# Patient Record
Sex: Male | Born: 1935 | Race: White | Hispanic: No | Marital: Married | Smoking: Former smoker
Health system: Southern US, Community
[De-identification: ages and names within clinical notes are randomized; demographics above are authoritative.]

## PROBLEM LIST (undated history)

## (undated) DIAGNOSIS — M069 Rheumatoid arthritis, unspecified: Secondary | ICD-10-CM

## (undated) DIAGNOSIS — I219 Acute myocardial infarction, unspecified: Secondary | ICD-10-CM

## (undated) DIAGNOSIS — N400 Enlarged prostate without lower urinary tract symptoms: Secondary | ICD-10-CM

## (undated) DIAGNOSIS — N184 Chronic kidney disease, stage 4 (severe): Secondary | ICD-10-CM

## (undated) DIAGNOSIS — I255 Ischemic cardiomyopathy: Secondary | ICD-10-CM

## (undated) DIAGNOSIS — I340 Nonrheumatic mitral (valve) insufficiency: Secondary | ICD-10-CM

## (undated) DIAGNOSIS — E119 Type 2 diabetes mellitus without complications: Secondary | ICD-10-CM

## (undated) DIAGNOSIS — E785 Hyperlipidemia, unspecified: Secondary | ICD-10-CM

## (undated) DIAGNOSIS — I10 Essential (primary) hypertension: Secondary | ICD-10-CM

## (undated) DIAGNOSIS — G629 Polyneuropathy, unspecified: Secondary | ICD-10-CM

## (undated) DIAGNOSIS — I509 Heart failure, unspecified: Secondary | ICD-10-CM

## (undated) DIAGNOSIS — C189 Malignant neoplasm of colon, unspecified: Secondary | ICD-10-CM

## (undated) HISTORY — DX: Malignant neoplasm of colon, unspecified: C18.9

## (undated) HISTORY — PX: CATARACT EXTRACTION: SUR2

## (undated) HISTORY — DX: Ischemic cardiomyopathy: I25.5

## (undated) HISTORY — DX: Acute myocardial infarction, unspecified: I21.9

## (undated) HISTORY — DX: Chronic kidney disease, stage 4 (severe): N18.4

---

## 1988-07-26 DIAGNOSIS — I219 Acute myocardial infarction, unspecified: Secondary | ICD-10-CM

## 1988-07-26 HISTORY — DX: Acute myocardial infarction, unspecified: I21.9

## 1997-12-19 ENCOUNTER — Encounter: Admission: RE | Admit: 1997-12-19 | Discharge: 1997-12-19 | Payer: Self-pay | Admitting: Sports Medicine

## 1998-03-20 ENCOUNTER — Encounter: Admission: RE | Admit: 1998-03-20 | Discharge: 1998-03-20 | Payer: Self-pay | Admitting: Sports Medicine

## 1998-05-23 ENCOUNTER — Encounter: Admission: RE | Admit: 1998-05-23 | Discharge: 1998-05-23 | Payer: Self-pay | Admitting: Family Medicine

## 1998-07-03 ENCOUNTER — Ambulatory Visit (HOSPITAL_COMMUNITY): Admission: RE | Admit: 1998-07-03 | Discharge: 1998-07-03 | Payer: Self-pay | Admitting: Cardiology

## 1998-07-03 ENCOUNTER — Encounter: Payer: Self-pay | Admitting: Cardiology

## 1998-07-11 ENCOUNTER — Encounter: Admission: RE | Admit: 1998-07-11 | Discharge: 1998-07-11 | Payer: Self-pay | Admitting: Sports Medicine

## 1998-08-01 ENCOUNTER — Encounter: Admission: RE | Admit: 1998-08-01 | Discharge: 1998-08-01 | Payer: Self-pay | Admitting: Sports Medicine

## 1998-10-16 ENCOUNTER — Encounter: Admission: RE | Admit: 1998-10-16 | Discharge: 1998-10-16 | Payer: Self-pay | Admitting: Sports Medicine

## 1999-02-12 ENCOUNTER — Encounter: Admission: RE | Admit: 1999-02-12 | Discharge: 1999-02-12 | Payer: Self-pay | Admitting: Sports Medicine

## 1999-05-21 ENCOUNTER — Encounter: Admission: RE | Admit: 1999-05-21 | Discharge: 1999-05-21 | Payer: Self-pay | Admitting: Sports Medicine

## 1999-06-24 ENCOUNTER — Encounter: Admission: RE | Admit: 1999-06-24 | Discharge: 1999-06-24 | Payer: Self-pay | Admitting: Family Medicine

## 1999-07-07 ENCOUNTER — Ambulatory Visit (HOSPITAL_COMMUNITY): Admission: RE | Admit: 1999-07-07 | Discharge: 1999-07-07 | Payer: Self-pay | Admitting: Cardiology

## 1999-07-07 ENCOUNTER — Encounter: Payer: Self-pay | Admitting: Cardiology

## 1999-07-30 ENCOUNTER — Other Ambulatory Visit: Admission: RE | Admit: 1999-07-30 | Discharge: 1999-07-30 | Payer: Self-pay | Admitting: Urology

## 1999-09-17 ENCOUNTER — Encounter: Admission: RE | Admit: 1999-09-17 | Discharge: 1999-09-17 | Payer: Self-pay | Admitting: Sports Medicine

## 1999-12-07 ENCOUNTER — Encounter: Admission: RE | Admit: 1999-12-07 | Discharge: 1999-12-07 | Payer: Self-pay | Admitting: Family Medicine

## 2000-02-04 ENCOUNTER — Encounter: Admission: RE | Admit: 2000-02-04 | Discharge: 2000-02-04 | Payer: Self-pay | Admitting: Family Medicine

## 2000-04-04 ENCOUNTER — Encounter: Admission: RE | Admit: 2000-04-04 | Discharge: 2000-04-04 | Payer: Self-pay | Admitting: Family Medicine

## 2000-05-19 ENCOUNTER — Encounter: Admission: RE | Admit: 2000-05-19 | Discharge: 2000-05-19 | Payer: Self-pay | Admitting: Family Medicine

## 2000-05-24 ENCOUNTER — Encounter: Admission: RE | Admit: 2000-05-24 | Discharge: 2000-05-24 | Payer: Self-pay | Admitting: Family Medicine

## 2000-08-04 ENCOUNTER — Encounter: Admission: RE | Admit: 2000-08-04 | Discharge: 2000-08-04 | Payer: Self-pay | Admitting: Family Medicine

## 2000-11-10 ENCOUNTER — Encounter: Admission: RE | Admit: 2000-11-10 | Discharge: 2000-11-10 | Payer: Self-pay | Admitting: Sports Medicine

## 2001-02-16 ENCOUNTER — Encounter: Admission: RE | Admit: 2001-02-16 | Discharge: 2001-02-16 | Payer: Self-pay | Admitting: Family Medicine

## 2001-05-18 ENCOUNTER — Encounter: Admission: RE | Admit: 2001-05-18 | Discharge: 2001-05-18 | Payer: Self-pay | Admitting: Sports Medicine

## 2001-08-10 ENCOUNTER — Encounter: Admission: RE | Admit: 2001-08-10 | Discharge: 2001-08-10 | Payer: Self-pay | Admitting: Sports Medicine

## 2001-12-07 ENCOUNTER — Encounter: Admission: RE | Admit: 2001-12-07 | Discharge: 2001-12-07 | Payer: Self-pay | Admitting: Sports Medicine

## 2002-03-15 ENCOUNTER — Encounter: Admission: RE | Admit: 2002-03-15 | Discharge: 2002-03-15 | Payer: Self-pay | Admitting: Sports Medicine

## 2002-05-10 ENCOUNTER — Encounter: Admission: RE | Admit: 2002-05-10 | Discharge: 2002-05-10 | Payer: Self-pay | Admitting: Sports Medicine

## 2002-06-14 ENCOUNTER — Encounter: Admission: RE | Admit: 2002-06-14 | Discharge: 2002-06-14 | Payer: Self-pay | Admitting: Sports Medicine

## 2002-07-12 ENCOUNTER — Encounter: Payer: Self-pay | Admitting: Cardiology

## 2002-07-12 ENCOUNTER — Ambulatory Visit (HOSPITAL_COMMUNITY): Admission: RE | Admit: 2002-07-12 | Discharge: 2002-07-12 | Payer: Self-pay | Admitting: Cardiology

## 2002-09-13 ENCOUNTER — Encounter: Admission: RE | Admit: 2002-09-13 | Discharge: 2002-09-13 | Payer: Self-pay | Admitting: Sports Medicine

## 2002-10-05 ENCOUNTER — Encounter: Admission: RE | Admit: 2002-10-05 | Discharge: 2002-10-05 | Payer: Self-pay | Admitting: Family Medicine

## 2002-10-12 ENCOUNTER — Encounter: Admission: RE | Admit: 2002-10-12 | Discharge: 2002-10-12 | Payer: Self-pay | Admitting: Family Medicine

## 2002-12-20 ENCOUNTER — Encounter: Admission: RE | Admit: 2002-12-20 | Discharge: 2002-12-20 | Payer: Self-pay | Admitting: Family Medicine

## 2003-01-07 ENCOUNTER — Encounter: Admission: RE | Admit: 2003-01-07 | Discharge: 2003-01-07 | Payer: Self-pay | Admitting: Sports Medicine

## 2003-01-07 ENCOUNTER — Encounter: Payer: Self-pay | Admitting: Sports Medicine

## 2003-01-07 ENCOUNTER — Encounter: Admission: RE | Admit: 2003-01-07 | Discharge: 2003-01-07 | Payer: Self-pay | Admitting: Family Medicine

## 2003-01-31 ENCOUNTER — Encounter: Admission: RE | Admit: 2003-01-31 | Discharge: 2003-01-31 | Payer: Self-pay | Admitting: Sports Medicine

## 2003-03-14 ENCOUNTER — Encounter: Admission: RE | Admit: 2003-03-14 | Discharge: 2003-03-14 | Payer: Self-pay | Admitting: Sports Medicine

## 2003-03-18 ENCOUNTER — Encounter: Admission: RE | Admit: 2003-03-18 | Discharge: 2003-03-18 | Payer: Self-pay | Admitting: Sports Medicine

## 2003-03-18 ENCOUNTER — Encounter: Payer: Self-pay | Admitting: Sports Medicine

## 2003-05-09 ENCOUNTER — Encounter: Admission: RE | Admit: 2003-05-09 | Discharge: 2003-05-09 | Payer: Self-pay | Admitting: Sports Medicine

## 2003-09-30 ENCOUNTER — Encounter: Admission: RE | Admit: 2003-09-30 | Discharge: 2003-09-30 | Payer: Self-pay | Admitting: Family Medicine

## 2003-09-30 ENCOUNTER — Ambulatory Visit (HOSPITAL_COMMUNITY): Admission: RE | Admit: 2003-09-30 | Discharge: 2003-09-30 | Payer: Self-pay | Admitting: Family Medicine

## 2003-10-03 ENCOUNTER — Encounter: Admission: RE | Admit: 2003-10-03 | Discharge: 2003-10-03 | Payer: Self-pay | Admitting: Sports Medicine

## 2003-10-10 ENCOUNTER — Encounter: Admission: RE | Admit: 2003-10-10 | Discharge: 2003-10-10 | Payer: Self-pay | Admitting: Sports Medicine

## 2003-11-14 ENCOUNTER — Encounter: Admission: RE | Admit: 2003-11-14 | Discharge: 2003-11-14 | Payer: Self-pay | Admitting: Family Medicine

## 2003-11-19 ENCOUNTER — Encounter: Admission: RE | Admit: 2003-11-19 | Discharge: 2004-02-17 | Payer: Self-pay | Admitting: Sports Medicine

## 2004-01-28 ENCOUNTER — Encounter: Admission: RE | Admit: 2004-01-28 | Discharge: 2004-01-28 | Payer: Self-pay | Admitting: Sports Medicine

## 2004-04-02 ENCOUNTER — Ambulatory Visit: Payer: Self-pay | Admitting: Sports Medicine

## 2004-04-30 ENCOUNTER — Ambulatory Visit: Payer: Self-pay | Admitting: Sports Medicine

## 2004-06-02 ENCOUNTER — Ambulatory Visit: Payer: Self-pay | Admitting: Family Medicine

## 2004-06-25 ENCOUNTER — Ambulatory Visit: Payer: Self-pay | Admitting: Sports Medicine

## 2004-09-03 ENCOUNTER — Ambulatory Visit: Payer: Self-pay | Admitting: Sports Medicine

## 2004-10-08 ENCOUNTER — Ambulatory Visit (HOSPITAL_COMMUNITY): Admission: RE | Admit: 2004-10-08 | Discharge: 2004-10-08 | Payer: Self-pay | Admitting: Sports Medicine

## 2004-10-08 ENCOUNTER — Ambulatory Visit: Payer: Self-pay | Admitting: Sports Medicine

## 2004-12-25 ENCOUNTER — Ambulatory Visit: Payer: Self-pay | Admitting: Sports Medicine

## 2005-03-08 ENCOUNTER — Ambulatory Visit (HOSPITAL_COMMUNITY): Admission: RE | Admit: 2005-03-08 | Discharge: 2005-03-08 | Payer: Self-pay | Admitting: Specialist

## 2005-03-30 ENCOUNTER — Ambulatory Visit: Payer: Self-pay | Admitting: Sports Medicine

## 2005-04-08 ENCOUNTER — Ambulatory Visit: Payer: Self-pay | Admitting: Sports Medicine

## 2005-05-28 ENCOUNTER — Ambulatory Visit: Payer: Self-pay | Admitting: Family Medicine

## 2005-07-15 ENCOUNTER — Ambulatory Visit: Payer: Self-pay | Admitting: Sports Medicine

## 2005-10-27 ENCOUNTER — Ambulatory Visit: Payer: Self-pay | Admitting: Sports Medicine

## 2006-01-13 ENCOUNTER — Ambulatory Visit: Payer: Self-pay | Admitting: Sports Medicine

## 2006-04-07 ENCOUNTER — Ambulatory Visit: Payer: Self-pay | Admitting: Sports Medicine

## 2006-05-27 ENCOUNTER — Ambulatory Visit: Payer: Self-pay | Admitting: Family Medicine

## 2006-05-30 ENCOUNTER — Ambulatory Visit: Payer: Self-pay | Admitting: Sports Medicine

## 2006-06-02 ENCOUNTER — Ambulatory Visit: Payer: Self-pay | Admitting: Sports Medicine

## 2006-06-13 ENCOUNTER — Ambulatory Visit: Payer: Self-pay | Admitting: Family Medicine

## 2006-07-07 ENCOUNTER — Ambulatory Visit: Payer: Self-pay | Admitting: Sports Medicine

## 2006-09-22 DIAGNOSIS — M109 Gout, unspecified: Secondary | ICD-10-CM | POA: Insufficient documentation

## 2006-09-22 DIAGNOSIS — I255 Ischemic cardiomyopathy: Secondary | ICD-10-CM

## 2006-09-22 DIAGNOSIS — E785 Hyperlipidemia, unspecified: Secondary | ICD-10-CM | POA: Insufficient documentation

## 2006-09-22 DIAGNOSIS — E669 Obesity, unspecified: Secondary | ICD-10-CM

## 2006-09-22 DIAGNOSIS — M479 Spondylosis, unspecified: Secondary | ICD-10-CM | POA: Insufficient documentation

## 2006-09-22 DIAGNOSIS — N400 Enlarged prostate without lower urinary tract symptoms: Secondary | ICD-10-CM

## 2006-10-06 ENCOUNTER — Telehealth: Payer: Self-pay | Admitting: *Deleted

## 2006-10-10 ENCOUNTER — Telehealth: Payer: Self-pay | Admitting: Family Medicine

## 2006-10-20 ENCOUNTER — Ambulatory Visit: Payer: Self-pay | Admitting: Sports Medicine

## 2006-11-22 ENCOUNTER — Telehealth: Payer: Self-pay | Admitting: *Deleted

## 2007-01-26 ENCOUNTER — Encounter: Payer: Self-pay | Admitting: Sports Medicine

## 2007-02-02 ENCOUNTER — Ambulatory Visit: Payer: Self-pay | Admitting: Sports Medicine

## 2007-03-08 ENCOUNTER — Telehealth: Payer: Self-pay | Admitting: Sports Medicine

## 2007-05-04 ENCOUNTER — Ambulatory Visit: Payer: Self-pay | Admitting: Sports Medicine

## 2007-05-04 DIAGNOSIS — M719 Bursopathy, unspecified: Secondary | ICD-10-CM

## 2007-05-04 DIAGNOSIS — M67919 Unspecified disorder of synovium and tendon, unspecified shoulder: Secondary | ICD-10-CM | POA: Insufficient documentation

## 2007-07-10 ENCOUNTER — Telehealth (INDEPENDENT_AMBULATORY_CARE_PROVIDER_SITE_OTHER): Payer: Self-pay | Admitting: Family Medicine

## 2007-08-24 ENCOUNTER — Ambulatory Visit: Payer: Self-pay | Admitting: Sports Medicine

## 2007-08-24 LAB — CONVERTED CEMR LAB

## 2007-08-25 LAB — CONVERTED CEMR LAB
BUN: 20 mg/dL (ref 6–23)
CO2: 26 meq/L (ref 19–32)
Calcium: 9.6 mg/dL (ref 8.4–10.5)
Chloride: 104 meq/L (ref 96–112)
Cholesterol: 132 mg/dL (ref 0–200)
Creatinine, Ser: 0.94 mg/dL (ref 0.40–1.50)
Glucose, Bld: 105 mg/dL — ABNORMAL HIGH (ref 70–99)
HCT: 41.9 % (ref 39.0–52.0)
HDL: 51 mg/dL (ref >39)
Hemoglobin: 14.1 g/dL (ref 13.0–17.0)
Total CHOL/HDL Ratio: 2.6
Triglycerides: 46 mg/dL (ref <150)
WBC: 6.8 10*3/uL (ref 4.0–10.5)

## 2007-11-16 ENCOUNTER — Ambulatory Visit: Payer: Self-pay | Admitting: Sports Medicine

## 2007-11-16 DIAGNOSIS — M7020 Olecranon bursitis, unspecified elbow: Secondary | ICD-10-CM

## 2008-01-01 ENCOUNTER — Telehealth: Payer: Self-pay | Admitting: Sports Medicine

## 2008-01-03 ENCOUNTER — Telehealth: Payer: Self-pay | Admitting: Sports Medicine

## 2008-02-08 ENCOUNTER — Ambulatory Visit: Payer: Self-pay | Admitting: Sports Medicine

## 2008-02-20 ENCOUNTER — Telehealth: Payer: Self-pay | Admitting: *Deleted

## 2008-04-19 ENCOUNTER — Ambulatory Visit: Payer: Self-pay | Admitting: Family Medicine

## 2008-05-24 ENCOUNTER — Ambulatory Visit: Payer: Self-pay | Admitting: Family Medicine

## 2008-05-24 DIAGNOSIS — R1011 Right upper quadrant pain: Secondary | ICD-10-CM

## 2008-07-30 ENCOUNTER — Encounter: Payer: Self-pay | Admitting: Family Medicine

## 2008-09-09 ENCOUNTER — Ambulatory Visit: Payer: Self-pay | Admitting: Sports Medicine

## 2008-09-09 DIAGNOSIS — R05 Cough: Secondary | ICD-10-CM

## 2008-12-26 ENCOUNTER — Encounter: Payer: Self-pay | Admitting: Family Medicine

## 2009-03-25 ENCOUNTER — Ambulatory Visit: Payer: Self-pay | Admitting: Family Medicine

## 2009-03-25 ENCOUNTER — Encounter: Admission: RE | Admit: 2009-03-25 | Discharge: 2009-03-25 | Payer: Self-pay | Admitting: Family Medicine

## 2009-03-25 DIAGNOSIS — E119 Type 2 diabetes mellitus without complications: Secondary | ICD-10-CM

## 2009-03-25 DIAGNOSIS — M545 Low back pain: Secondary | ICD-10-CM

## 2009-03-26 ENCOUNTER — Encounter: Payer: Self-pay | Admitting: Family Medicine

## 2009-04-01 ENCOUNTER — Encounter: Payer: Self-pay | Admitting: Family Medicine

## 2009-04-01 ENCOUNTER — Ambulatory Visit: Payer: Self-pay | Admitting: Family Medicine

## 2009-04-17 ENCOUNTER — Telehealth: Payer: Self-pay | Admitting: Family Medicine

## 2009-07-15 ENCOUNTER — Ambulatory Visit: Payer: Self-pay | Admitting: Sports Medicine

## 2009-07-15 ENCOUNTER — Encounter: Payer: Self-pay | Admitting: Family Medicine

## 2009-07-22 ENCOUNTER — Ambulatory Visit: Payer: Self-pay | Admitting: Internal Medicine

## 2009-07-22 DIAGNOSIS — R972 Elevated prostate specific antigen [PSA]: Secondary | ICD-10-CM | POA: Insufficient documentation

## 2009-07-22 DIAGNOSIS — I10 Essential (primary) hypertension: Secondary | ICD-10-CM | POA: Insufficient documentation

## 2009-07-24 ENCOUNTER — Telehealth: Payer: Self-pay | Admitting: Internal Medicine

## 2009-09-01 ENCOUNTER — Telehealth: Payer: Self-pay | Admitting: Family Medicine

## 2009-10-09 ENCOUNTER — Encounter: Payer: Self-pay | Admitting: Family Medicine

## 2009-10-14 LAB — CONVERTED CEMR LAB
Albumin: 4.1 g/dL (ref 3.5–5.2)
Alkaline Phosphatase: 40 units/L (ref 39–117)
BUN: 20 mg/dL (ref 6–23)
Calcium: 9.1 mg/dL (ref 8.4–10.5)
Glucose, Bld: 113 mg/dL — ABNORMAL HIGH (ref 70–99)
HDL: 47 mg/dL (ref >39)
Hemoglobin: 13.6 g/dL (ref 13.0–17.0)
LDL Cholesterol: 75 mg/dL (ref 0–99)
MCHC: 32.6 g/dL (ref 30.0–36.0)
MCV: 89.7 fL (ref 78.0–100.0)
Potassium: 4.4 meq/L (ref 3.5–5.3)
RBC: 4.65 M/uL (ref 4.22–5.81)
Triglycerides: 59 mg/dL (ref <150)

## 2009-10-17 ENCOUNTER — Ambulatory Visit: Payer: Self-pay | Admitting: Family Medicine

## 2009-12-25 ENCOUNTER — Ambulatory Visit: Payer: Self-pay | Admitting: Family Medicine

## 2009-12-25 ENCOUNTER — Encounter: Payer: Self-pay | Admitting: Family Medicine

## 2009-12-26 ENCOUNTER — Encounter: Payer: Self-pay | Admitting: Family Medicine

## 2009-12-26 LAB — CONVERTED CEMR LAB
Alkaline Phosphatase: 39 units/L (ref 39–117)
BUN: 23 mg/dL (ref 6–23)
Creatinine, Ser: 0.93 mg/dL (ref 0.40–1.50)
Glucose, Bld: 108 mg/dL — ABNORMAL HIGH (ref 70–99)
Hemoglobin: 13.1 g/dL (ref 13.0–17.0)
MCHC: 33.6 g/dL (ref 30.0–36.0)
MCV: 86.5 fL (ref 78.0–100.0)
RBC: 4.51 M/uL (ref 4.22–5.81)
Total Bilirubin: 0.6 mg/dL (ref 0.3–1.2)

## 2010-02-13 ENCOUNTER — Ambulatory Visit: Payer: Self-pay | Admitting: Family Medicine

## 2010-03-12 ENCOUNTER — Telehealth: Payer: Self-pay | Admitting: *Deleted

## 2010-03-13 ENCOUNTER — Telehealth (INDEPENDENT_AMBULATORY_CARE_PROVIDER_SITE_OTHER): Payer: Self-pay | Admitting: *Deleted

## 2010-04-30 ENCOUNTER — Ambulatory Visit: Payer: Self-pay | Admitting: Sports Medicine

## 2010-08-25 NOTE — Progress Notes (Signed)
Summary: Rx  Phone Note Refill Request Call back at Home Phone [phone]   pt is asking for an Rx Voltaren Gel, sts he gets this in an oral form but wants this in the Gel. pt goes to [facility] pharmacy  Initial call taken by: Knox Royalty,  March 12, 2010 2:54 PM  Follow-up for Phone Call        called and notified pt of rx at pharmacy ready for pick up Follow-up by: Tessie Fass CMA,  March 13, 2010 11:38 AM    New/Updated Medications: VOLTAREN 1 % GEL (DICLOFENAC SODIUM) Sig: Apply to affected area up to four times daily DISP 1 large tube Prescriptions: VOLTAREN 1 % GEL (DICLOFENAC SODIUM) Sig: Apply to affected area up to four times daily DISP 1 large tube  #1 x 3   Entered and Authorized by:   Paula Compton MD   Signed by:   Paula Compton MD on 03/13/2010   Method used:   Electronically to        [facility] [address] (retail)       [address] [place], [place]  [address]       Ph: [phone]       Fax: [phone]   RxID:   [id number]  Please let patient know the Rx was sent in.  Paula Compton MD  March 13, 2010 11:13 AM

## 2010-08-25 NOTE — Letter (Signed)
Summary: Generic Letter  [facility]  [address]   [place], [place] [address]   Phone: [phone]  Fax: [phone]    12/26/2009  Todd [name] [address] [place], [place]  [address]  Dear [name],   I hope this letter finds you well.  I write with excellent news about your lab work.  Your Complete Blood Count, metabolic panel, and LDL "bad" cholesterol are all within normal limits.   I am sending a copy of the lab results along with this letter.        Sincerely,   Paula Compton MD  Appended Document: Generic Letter mailed

## 2010-08-25 NOTE — Consult Note (Signed)
Summary: [facility]  [facility]   Imported By: Bradly Bienenstock 10/13/2009 17:30:08  _____________________________________________________________________  External Attachment:    Type:   Image     Comment:   External Document

## 2010-08-25 NOTE — Assessment & Plan Note (Signed)
Summary: ELBOW ISSUE,[facility]   Primary Provider:  Dondra Spry DO   History of Present Illness: Todd [name] has had recurrent gout flares over several years now almost 2 yrs since much elbow pain has been doing water aerobics regualarly recently some mild RT elbow pain posteriorly and on left elbow mod pain and swelling over post elbow  no fever no chills occ uses voltaren or gel with good results on feet, hands  Allergies: 1)  Digitek  Physical Exam  General:  Well-developed,well-nourished,in no acute distress; alert,appropriate and cooperative throughout examination Msk:  good ROM of RT elbow no swelling over olecranon in hands there are some nodules that appear OA and a few dorsal changes over 2nd PIP that appear likely to be small tophi  left elbow shows marked swelling of olecranon bursa no warmth no tenderness   Impression & Recommendations:  Problem # 1:  OLECRANON BURSITIS (ICD-726.33)  This is a recurrent problem for Todd [name] use low dose steroid  cleanse with alcohol Topical analgesic spray : Ethyl choride Joint left olecranon bursa Approached in typical fashion with: direct posterior puncture of bursa Completed without difficulty Meds: 3/4 cc kenalog 10 + 3/4 cc lidocaine 1% Needle: 25 G and 1.5 in Aftercare instructions and Red flags advised   avoid much acticity next 2 days start using compression wrap  use particularly for work and arm acticity to see if we can lessen flares  reck 4 wks pending response or as needed  Orders: Garment,belt,sleeve or other covering ,elastic or similar stretch (W1027) Kenalog 10 mg inj (O5366) Trigger Point Injection Single Tendon Origin/Insertion (44034)  Complete Medication List: 1)  Aspirin Ec 81 Mg Tbec (Aspirin) .... Take 1 tablet by mouth once a day 2)  Benazepril Hcl 40 Mg Tabs (Benazepril hcl) .... Take 1 tablet by mouth once a day 3)  Doxazosin Mesylate 8 Mg Tabs (Doxazosin mesylate) .... Take 1 tablet by mouth  once a day 4)  Felodipine 5 Mg Tb24 (Felodipine) .... Take 1 tablet by mouth once a day 5)  Glucosamine-chondroitin [employer] (Glucosamine-chondroit-vit c-mn) .... Take 1 capsule by mouth three times a day 6)  Hydrochlorothiazide 25 Mg Tabs (Hydrochlorothiazide) .... Take 1 tablet by mouth once a day 7)  Isosorbide Mononitrate Cr 60 Mg Tb24 (Isosorbide mononitrate) .... Take 1 tablet by mouth once a day 8)  Simvastatin 40 Mg Tabs (Simvastatin) .... Take 1 tablet by mouth at bedtime 9)  Udamin Sp Tabs (Specialty vitamins products) .... Take 1 tablet by mouth once a day 10)  Nitroquick 0.4 Mg Subl (Nitroglycerin) .Marland Kitchen.. 1 sl as needed and repaeat in 5 mins if needed 11)  Diclofenac Sodium 50 Mg Tbec (Diclofenac sodium) .... Take one tab as needed 12)  Mometasone Furoate 0.1 % Crea (Mometasone furoate) .... Apply daily 13)  Fish Oil Double Strength 1200 Mg Caps (Omega-3 fatty acids) .Marland Kitchen.. 1 by mouth bid 14)  Nitroglycerin 0.2 Mg/hr Pt24 (Nitroglycerin) .... Use 1 patch qd 15)  Multivitamins Tabs (Multiple vitamin) .... Once daily 16)  Claritin 10 Mg Caps (Loratadine) .... Once daily 17)  Toprol Xl 25 Mg Xr24h-tab (Metoprolol succinate) .Marland Kitchen.. 1 by mouth once daily 18)  Gfn 1200/dm 60 1200-60 Mg Xr12h-tab (Dextromethorphan-guaifenesin) .... Sig take 1 tab by mouth every 12 hours as directed for cough/congestion disp 30 19)  Zostavax 74259 Unt/0.80ml Solr (Zoster vaccine live) .... Dispense vaccine to patient,to be adminstered in doctors office. 20)  Voltaren 1 % Gel (Diclofenac sodium) .... Sig: apply  to affected area up to four times daily disp 1 large tube

## 2010-08-25 NOTE — Progress Notes (Signed)
Summary: Rx Prob  Phone Note Call from Patient Call back at Home Phone [phone]   Caller: Patient Summary of Call: Pt states went to pick up rx from [facility] and it was not there can it be re sent. Initial call taken by: Clydell Hakim,  March 13, 2010 2:01 PM  Follow-up for Phone Call        rx is at pharmacy ready to pick up. patient notified. Follow-up by: Theresia Lo RN,  March 13, 2010 3:32 PM

## 2010-08-25 NOTE — Assessment & Plan Note (Signed)
Summary: back pain,tcb   Vital Signs:  Patient profile:   77 year old male Height:      69 inches Weight:      221 pounds BMI:     32.75 Temp:     98.4 degrees F Pulse rate:   58 / minute BP sitting:   134 / 68  (left arm) Cuff size:   regular  Vitals Entered By: Dennison Nancy RN (February 13, 2010 4:06 PM) CC: [place] for low back pain Is Patient Diabetic? Yes Did you bring your meter with you today? No Pain Assessment Patient in pain? yes     Location: lower back Intensity: 2 Type: aching Onset of pain  Since Friday   Primary Care Provider:  D. Thomos Lemons DO  CC:  [place] for low back pain.  History of Present Illness: Todd [name] comes in today for complaint of acute lower back pain that is focused on the L side of his lower back, started after some light moving on Friday, July 15 (1 week ago).  Not similar to his usual DJD back pain, which is more in the midline.  No radiation of this pain down the legs, no weakness, no falls.   He tried to exercise the next day, also on Monday July 18th, but aggravated.  Got a little better with NSAIDs, then stopped and it came back.   Reviewed med list.  No recent gouty attacks.  He shows me some nodules along DIP joints in both hands, which are new and occasionally flare.   Goes to [place] at the [employer] [facility] with his wife, has done so for 25 yrs.   Habits & Providers  Alcohol-Tobacco-Diet     Tobacco Status: quit     Year Quit: 1969  Allergies: 1)  Digitek  Social History: married Suse - she is retiring May 26, 2007 2 children; retired Horticulturist, commercial now consults;  wine social etoh only;  no tobacco    February 13, 2010: Planning cruise to [place] to [place] in November. Aquacise program at [address]  Physical Exam  General:  Generally well, no apparent distress Mouth:  moist mucus membranes Lungs:  Normal respiratory effort, chest expands symmetrically. Lungs are clear to  auscultation, no crackles or wheezes. Heart:  Normal rate and regular rhythm. S1 and S2 normal without gallop, murmur, click, rub or other extra sounds. Msk:  back: mild paraspinous tenderness along sacral on the L side.  Full ROM with forward bending, sidebending.  Gait unremarkable, no assistance.  Extremities:  1+ edema pitting in both feet. Palpable dp pulses bilaterally   Impression & Recommendations:  Problem # 1:  BACK PAIN, LUMBAR (ICD-724.2)  Mild and improving.  Recommended that he take the diclofenac ATC for the next few days, with plenty of water to maintain hydration in the hot weather.  Keep up with stretching and Aquacise at low intensity, gradually increase.  Notify me if starts with radiation, worsening pain or weakness.  Past workup for LBP has included xrays showing DJD, CBC without anemia, no elevated alk phos.  His updated medication list for this problem includes:    Aspirin Ec 81 Mg Tbec (Aspirin) .Marland Kitchen... Take 1 tablet by mouth once a day    Diclofenac Sodium 50 Mg Tbec (Diclofenac sodium) .Marland Kitchen... Take one tab as needed  Orders: [facility]- Est Level  3 ([address])  Problem # 2:  HYPERTENSION (ICD-401.9)  controlled. continue current plan His updated  medication list for this problem includes:    Benazepril Hcl 40 Mg Tabs (Benazepril hcl) .Marland Kitchen... Take 1 tablet by mouth once a day    Doxazosin Mesylate 8 Mg Tabs (Doxazosin mesylate) .Marland Kitchen... Take 1 tablet by mouth once a day    Felodipine 5 Mg Tb24 (Felodipine) .Marland Kitchen... Take 1 tablet by mouth once a day    Hydrochlorothiazide 25 Mg Tabs (Hydrochlorothiazide) .Marland Kitchen... Take 1 tablet by mouth once a day    Toprol Xl 25 Mg Xr24h-tab (Metoprolol succinate) .Marland Kitchen... 1 by mouth once daily  Orders: [facility]- Est Level  3 ([address])  Complete Medication List: 1)  Aspirin Ec 81 Mg Tbec (Aspirin) .... Take 1 tablet by mouth once a day 2)  Benazepril Hcl 40 Mg Tabs (Benazepril hcl) .... Take 1 tablet by mouth once a day 3)  Doxazosin Mesylate 8 Mg Tabs  (Doxazosin mesylate) .... Take 1 tablet by mouth once a day 4)  Felodipine 5 Mg Tb24 (Felodipine) .... Take 1 tablet by mouth once a day 5)  Glucosamine-chondroitin [employer] (Glucosamine-chondroit-vit c-mn) .... Take 1 capsule by mouth three times a day 6)  Hydrochlorothiazide 25 Mg Tabs (Hydrochlorothiazide) .... Take 1 tablet by mouth once a day 7)  Isosorbide Mononitrate Cr 60 Mg Tb24 (Isosorbide mononitrate) .... Take 1 tablet by mouth once a day 8)  Simvastatin 40 Mg Tabs (Simvastatin) .... Take 1 tablet by mouth at bedtime 9)  Udamin Sp Tabs (Specialty vitamins products) .... Take 1 tablet by mouth once a day 10)  Nitroquick 0.4 Mg Subl (Nitroglycerin) .Marland Kitchen.. 1 sl as needed and repaeat in 5 mins if needed 11)  Diclofenac Sodium 50 Mg Tbec (Diclofenac sodium) .... Take one tab as needed 12)  Mometasone Furoate 0.1 % Crea (Mometasone furoate) .... Apply daily 13)  Fish Oil Double Strength 1200 Mg Caps (Omega-3 fatty acids) .Marland Kitchen.. 1 by mouth bid 14)  Nitroglycerin 0.2 Mg/hr Pt24 (Nitroglycerin) .... Use 1 patch qd 15)  Multivitamins Tabs (Multiple vitamin) .... Once daily 16)  Claritin 10 Mg Caps (Loratadine) .... Once daily 17)  Toprol Xl 25 Mg Xr24h-tab (Metoprolol succinate) .Marland Kitchen.. 1 by mouth once daily 18)  Gfn 1200/dm 60 1200-60 Mg Xr12h-tab (Dextromethorphan-guaifenesin) .... Sig take 1 tab by mouth every 12 hours as directed for cough/congestion disp 30 19)  Zostavax 60454 Unt/0.52ml Solr (Zoster vaccine live) .... Dispense vaccine to patient,to be adminstered in doctors office.  Patient Instructions: 1)  It was a pleasure to see you today.   2)  For your low back pain, I recommend continuing with the Diclofenac 50mg  twice daily for the next 5 days.  Continue with the increased hydration as you are doing in this hot weather.  Continuing with the range-of-motion exercises/stretches and Aquatic activity at a lower intensity as your back strengthens.  3)  Please let me know if there is not  slow improvement.  As you know from experience, low back pain improvement is often slow and steady (measured in weeks, not days).  4)  Enjoy the cruise in November!   Prevention & Chronic Care Immunizations   Influenza vaccine: Fluvax MCR  (04/19/2008)   Influenza vaccine due: 04/19/2009    Tetanus booster: 05/24/2008: Tdap [facility])   Tetanus booster due: 04/25/2005    Pneumococcal vaccine: Done.  (05/26/2000)   Pneumococcal vaccine due: None    H. zoster vaccine: Not documented  Colorectal Screening   Hemoccult: Not documented    Colonoscopy: Not documented  Other Screening   PSA:  Not documented   Smoking status: quit  (02/13/2010)  Diabetes Mellitus   HgbA1C: 5.7  (12/25/2009)    Eye exam: Not documented    Foot exam: yes  (10/17/2009)   High risk foot: Not documented   Foot care education: Not documented    Urine microalbumin/creatinine ratio: Not documented    Diabetes flowsheet reviewed?: Yes   Progress toward A1C goal: At goal  Lipids   Total Cholesterol: 134  (04/01/2009)   LDL: 75  (04/01/2009)   LDL Direct: 66  (12/25/2009)   HDL: 47  (04/01/2009)   Triglycerides: 59  (04/01/2009)    SGOT (AST): 16  (12/25/2009)   SGPT (ALT): 12  (12/25/2009)   Alkaline phosphatase: 39  (12/25/2009)   Total bilirubin: 0.6  (12/25/2009)    Lipid flowsheet reviewed?: Yes   Progress toward LDL goal: At goal  Hypertension   Last Blood Pressure: 134 / 68  (02/13/2010)   Serum creatinine: 0.93  (12/25/2009)   Serum potassium 4.1  (12/25/2009)    Hypertension flowsheet reviewed?: Yes   Progress toward BP goal: At goal  Self-Management Support :   Personal Goals (by the next clinic visit) :     Personal A1C goal: 7  (10/17/2009)     Personal blood pressure goal: 140/90  (02/13/2010)     Personal LDL goal: 100  (10/17/2009)    Diabetes self-management support: Not documented    Hypertension self-management support: Not documented    Lipid self-management  support: Not documented

## 2010-08-25 NOTE — Progress Notes (Signed)
Summary: phn msg  Phone Note Call from Patient Call back at Home Phone [phone]   Caller: Patient Summary of Call: wanted to talk to Dr Mauricio Po about his Toperol - he is now on 25mg  and everything is OK. Initial call taken by: De Nurse,  September 01, 2009 11:41 AM    New/Updated Medications: TOPROL XL 25 MG XR24H-TAB (METOPROLOL SUCCINATE) 1 by mouth once daily Prescriptions: TOPROL XL 25 MG XR24H-TAB (METOPROLOL SUCCINATE) 1 by mouth once daily  #90 x 3   Entered and Authorized by:   Paula Compton MD   Signed by:   Paula Compton MD on 09/01/2009   Method used:   Electronically to        [facility] [address] (retail)       [address] [place], [place]  [address]       Ph: [phone]       Fax: [phone]   RxID:   [id number]   I called patient back.  He is not home.  Wife says he wanted a refill of the Toprol sent to [facility].  Will send in the Prescription. Paula Compton MD  September 01, 2009 2:00 PM '

## 2010-08-25 NOTE — Assessment & Plan Note (Signed)
Summary: f/u,df   Vital Signs:  Patient profile:   77 year old male Height:      69 inches Weight:      218 pounds BMI:     32.31 Temp:     97.7 degrees F oral Pulse rate:   67 / minute BP sitting:   118 / 63  (right arm) Cuff size:   regular  Vitals Entered By: Tessie Fass CMA (October 17, 2009 1:53 PM) CC: F/U Is Patient Diabetic? No Pain Assessment Patient in pain? no        Primary Care Provider:  Dondra Spry DO  CC:  F/U.  History of Present Illness: Todd [name] is doing well.  No active concerns except for a transient R sided abd pain that is superficial, lasts for a few minutes "just under the skin", does not radiate, and then resolves.  No skin changes.  Not associated with position, with torsing at waist, with deep breathing, or with bowel or bladderfunctions.  Not having it now.   Occasional bilat foot swelling.  We discussed his HF and the discussion with Dr Clarene Duke regarding AICD.  Todd [name] reiterates for me today his lack of interest in pursuing this possibility.   Last labs in Sept 2010, plans to obtain in fasting state.   He asks about Zostavax to prevent shingles. Has never had shingles before,  I recommend the vaccine. He is to call his insurer to find out if it is covered.  Can be prescribed to his pharmacy, and administered here.    Habits & Providers  Alcohol-Tobacco-Diet     Tobacco Status: quit  Current Medications (verified): 1)  Aspirin Ec 81 Mg Tbec (Aspirin) .... Take 1 Tablet By Mouth Once A Day 2)  Benazepril Hcl 40 Mg Tabs (Benazepril Hcl) .... Take 1 Tablet By Mouth Once A Day 3)  Doxazosin Mesylate 8 Mg Tabs (Doxazosin Mesylate) .... Take 1 Tablet By Mouth Once A Day 4)  Felodipine 5 Mg Tb24 (Felodipine) .... Take 1 Tablet By Mouth Once A Day 5)  Glucosamine-Chondroitin Max [employer] (Glucosamine-Chondroit-Vit C-Mn) .... Take 1 Capsule By Mouth Three Times A Day 6)  Hydrochlorothiazide 25 Mg Tabs (Hydrochlorothiazide) .... Take 1 Tablet  By Mouth Once A Day 7)  Isosorbide Mononitrate Cr 60 Mg Tb24 (Isosorbide Mononitrate) .... Take 1 Tablet By Mouth Once A Day 8)  Simvastatin 40 Mg Tabs (Simvastatin) .... Take 1 Tablet By Mouth At Bedtime 9)  Udamin Sp  Tabs (Specialty Vitamins Products) .... Take 1 Tablet By Mouth Once A Day 10)  Nitroquick 0.4 Mg  Subl (Nitroglycerin) .Marland Kitchen.. 1 Sl As Needed and Repaeat in 5 Mins If Needed 11)  Diclofenac Sodium 50 Mg  Tbec (Diclofenac Sodium) .... Take One Tab As Needed 12)  Mometasone Furoate 0.1 %  Crea (Mometasone Furoate) .... Apply Daily 13)  Fish Oil Double Strength 1200 Mg  Caps (Omega-3 Fatty Acids) .Marland Kitchen.. 1 By Mouth Bid 14)  Anusol-Hc 2.5 %  Crea (Hydrocortisone) .... Apply To Hemorrhoids Up To Five Times Daily As Needed Disp. 1 Tube (Largest Unit Avail) 15)  Nitroglycerin 0.2 Mg/hr Pt24 (Nitroglycerin) .... Use 1 Patch Qd 16)  Multivitamins   Tabs (Multiple Vitamin) .... Once Daily 17)  Claritin 10 Mg Caps (Loratadine) .... Once Daily 18)  Colchicine 0.6 Mg Tabs (Colchicine) .... One Half Tab By Mouth Qd 19)  Uloric 40 Mg Tabs (Febuxostat) .... One By Mouth Qd 20)  Toprol Xl 25 Mg Xr24h-Tab (  Metoprolol Succinate) .Marland Kitchen.. 1 By Mouth Once Daily 21)  Gfn 1200/dm 60 1200-60 Mg Xr12h-Tab (Dextromethorphan-Guaifenesin) .... Sig Take 1 Tab By Mouth Every 12 Hours As Directed For Cough/congestion Disp 30  Allergies (verified): 1)  Digitek  Physical Exam  General:  Well-developed,well-nourished,in no acute distress; alert,appropriate and cooperative throughout examination Eyes:  No corneal or conjunctival inflammation noted. EOMI. Perrla. Funduscopic exam benign, without hemorrhages, exudates or papilledema. Vision grossly normal. Ears:  External ear exam shows no significant lesions or deformities.  Otoscopic examination reveals clear canals, tympanic membranes are intact bilaterally without bulging, retraction, inflammation or discharge. Hearing is grossly normal bilaterally. Nose:  External  nasal examination shows no deformity or inflammation. Nasal mucosa are pink and moist without lesions or exudates. Mouth:  Oral mucosa and oropharynx without lesions or exudates.  Teeth in good repair. Neck:  No deformities, masses, or tenderness noted. Lungs:  Normal respiratory effort, chest expands symmetrically. Lungs are clear to auscultation, no crackles or wheezes. Heart:  Normal rate and regular rhythm. S1 and S2 normal without gallop, murmur, click, rub or other extra sounds. Abdomen:  Bowel sounds positive,abdomen soft and non-tender without masses, organomegaly or hernias noted. Pulses:  R and L carotid,radial,femoral,dorsalis pedis and posterior tibial pulses are full and equal bilaterally Extremities:  No clubbing, cyanosis, edema, or deformity noted with normal full range of motion of all joints.    Diabetes Management Exam:    Foot Exam (with socks and/or shoes not present):       Sensory-Pinprick/Light touch:          Left medial foot (L-4): normal          Left dorsal foot (L-5): normal          Left lateral foot (S-1): normal          Right medial foot (L-4): normal          Right dorsal foot (L-5): normal          Right lateral foot (S-1): normal       Sensory-Monofilament:          Left foot: normal          Right foot: normal       Inspection:          Left foot: normal          Right foot: normal       Nails:          Left foot: normal          Right foot: normal   Impression & Recommendations:  Problem # 1:  HYPERTENSION (ICD-401.9) Well controlled.  I reviewed the most recent note from his cardiologist, Dr Clarene Duke. Plan to send lab results to him once they are available.  His updated medication list for this problem includes:    Benazepril Hcl 40 Mg Tabs (Benazepril hcl) .Marland Kitchen... Take 1 tablet by mouth once a day    Doxazosin Mesylate 8 Mg Tabs (Doxazosin mesylate) .Marland Kitchen... Take 1 tablet by mouth once a day    Felodipine 5 Mg Tb24 (Felodipine) .Marland Kitchen... Take 1 tablet  by mouth once a day    Hydrochlorothiazide 25 Mg Tabs (Hydrochlorothiazide) .Marland Kitchen... Take 1 tablet by mouth once a day    Toprol Xl 25 Mg Xr24h-tab (Metoprolol succinate) .Marland Kitchen... 1 by mouth once daily  Orders: [facility]- Est  Level 4 (99214)Future Orders: Lipid-[facility] ([id number]) ... 10/01/2010 Comp Met-[facility] ([id number]) ... 10/09/2010 CBC-[facility] (29562) ... 10/08/2010  Problem # 2:  ALLERGIC RHINITIS (ICD-477.9) Has longstanding issue with allergic rhinitis, as well as recurrent sinusitis and upper respiratory sxs with airplane travel.  He and his wife travel a lot, and he has a habit of bringing amoxicillin with him to take upon deplaning, as treatment for symptoms which he describes as productive cough, nasal congestion, which typically lasts for several days. He recalls a trip to [place] where these sxs began in first day after airline travel, he ascribes the sxs on airline travel and his eventual recovery to the antibiotic use.  We had a long discussion about this today, during which time I explained that the antibiotic use may not have a causative relationship to his recovery (ie, that a self-limited illness will improve irrespective of the use of abx).  I shared my concern regarding possible adverse outcomes related to abx use.  We will focus on control of sxs; he often goes on cruises, which have mini-hospitals on them and I recommend he get medical attention from the cruise physicians before starting abx.  His updated medication list for this problem includes:    Claritin 10 Mg Caps (Loratadine) ..... Once daily  Problem # 3:  DIABETES MELLITUS, CONTROLLED, WITHOUT COMPLICATIONS (ICD-250.00) Has been well controlled.  Will check A1C with next labs.  To forward labs to his cardiologist, Dr Clarene Duke.  His updated medication list for this problem includes:    Aspirin Ec 81 Mg Tbec (Aspirin) .Marland Kitchen... Take 1 tablet by mouth once a day    Benazepril Hcl 40 Mg Tabs (Benazepril hcl) .Marland Kitchen... Take 1 tablet by mouth once  a day  Orders: [facility]- Est  Level 4 (99214)Future Orders: Lipid[facility] ([id number]) ... 10/01/2010 Comp Met-[facility] ([id number]) ... 10/09/2010 A1C-[facility] (84696) ... 10/09/2010  Complete Medication List: 1)  Aspirin Ec 81 Mg Tbec (Aspirin) .... Take 1 tablet by mouth once a day 2)  Benazepril Hcl 40 Mg Tabs (Benazepril hcl) .... Take 1 tablet by mouth once a day 3)  Doxazosin Mesylate 8 Mg Tabs (Doxazosin mesylate) .... Take 1 tablet by mouth once a day 4)  Felodipine 5 Mg Tb24 (Felodipine) .... Take 1 tablet by mouth once a day 5)  Glucosamine-chondroitin [employer] (Glucosamine-chondroit-vit c-mn) .... Take 1 capsule by mouth three times a day 6)  Hydrochlorothiazide 25 Mg Tabs (Hydrochlorothiazide) .... Take 1 tablet by mouth once a day 7)  Isosorbide Mononitrate Cr 60 Mg Tb24 (Isosorbide mononitrate) .... Take 1 tablet by mouth once a day 8)  Simvastatin 40 Mg Tabs (Simvastatin) .... Take 1 tablet by mouth at bedtime 9)  Udamin Sp Tabs (Specialty vitamins products) .... Take 1 tablet by mouth once a day 10)  Nitroquick 0.4 Mg Subl (Nitroglycerin) .Marland Kitchen.. 1 sl as needed and repaeat in 5 mins if needed 11)  Diclofenac Sodium 50 Mg Tbec (Diclofenac sodium) .... Take one tab as needed 12)  Mometasone Furoate 0.1 % Crea (Mometasone furoate) .... Apply daily 13)  Fish Oil Double Strength 1200 Mg Caps (Omega-3 fatty acids) .Marland Kitchen.. 1 by mouth bid 14)  Anusol-hc 2.5 % Crea (Hydrocortisone) .... Apply to hemorrhoids up to five times daily as needed disp. 1 tube (largest unit avail) 15)  Nitroglycerin 0.2 Mg/hr Pt24 (Nitroglycerin) .... Use 1 patch qd 16)  Multivitamins Tabs (Multiple vitamin) .... Once daily 17)  Claritin 10 Mg Caps (Loratadine) .... Once daily 18)  Colchicine 0.6 Mg Tabs (Colchicine) .... One half tab by mouth qd 19)  Uloric 40 Mg Tabs (Febuxostat) .... One by mouth qd 20)  Toprol Xl 25 Mg Xr24h-tab (Metoprolol succinate) .Marland Kitchen.. 1 by mouth once daily 21)  Gfn 1200/dm 60 1200-60 Mg Xr12h-tab  (Dextromethorphan-guaifenesin) .... Sig take 1 tab by mouth every 12 hours as directed for cough/congestion disp 30 22)  Zostavax 47829 Unt/0.42ml Solr (Zoster vaccine live) .... Dispense vaccine to patient,to be adminstered in doctors office. Prescriptions: ZOSTAVAX [id number] UNT/0.65ML SOLR (ZOSTER VACCINE LIVE) Dispense vaccine to patient,to be adminstered in doctors office.  #1 x 0   Entered and Authorized by:   Paula Compton MD   Signed by:   Paula Compton MD on 10/17/2009   Method used:   Electronically to        [facility] [phone]* (retail)       [address] [place], [place]  [address]       Ph: [phone]       Fax: [phone]   RxID:   [id number] GFN 1200/DM 60 1200-60 MG XR12H-TAB (DEXTROMETHORPHAN-GUAIFENESIN) SIG Take 1 tab by mouth every 12 hours as directed for cough/congestion DISP 30  #30 x 5   Entered and Authorized by:   Paula Compton MD   Signed by:   Paula Compton MD on 10/17/2009   Method used:   Electronically to        [facility] [phone]* (retail)       [address] [place], [place]  [address]       Ph: [phone]       Fax: [phone]   RxID:   [id number] BENAZEPRIL HCL 40 MG TABS (BENAZEPRIL HCL) Take 1 tablet by mouth once a day  #90 x 3   Entered and Authorized by:   Paula Compton MD   Signed by:   Paula Compton MD on 10/17/2009   Method used:   Electronically to        [facility] [phone]* (retail)       [address] [place], [place]  [address]       Ph: [phone]       Fax: [phone]   RxID:   [id number]    Prevention & Chronic Care Immunizations   Influenza vaccine: Fluvax MCR  (04/19/2008)   Influenza vaccine due: 04/19/2009    Tetanus booster: 05/24/2008: Tdap [facility])   Tetanus booster due: 04/25/2005    Pneumococcal vaccine: Done.  (05/26/2000)   Pneumococcal vaccine due: None    H. zoster vaccine: Not  documented  Colorectal Screening   Hemoccult: Not documented    Colonoscopy: Not documented  Other Screening   PSA: Not documented   Smoking status: quit  (10/17/2009)  Diabetes Mellitus   HgbA1C: 5.6  (04/01/2009)    Eye exam: Not documented    Foot exam: yes  (10/17/2009)   High risk foot: Not documented   Foot care education: Not documented    Urine microalbumin/creatinine ratio: Not documented    Diabetes flowsheet reviewed?: Yes   Progress toward A1C goal: At goal  Lipids   Total Cholesterol: 134  (04/01/2009)   LDL: 75  (04/01/2009)   LDL Direct: Not documented   HDL: 47  (04/01/2009)   Triglycerides: 59  (04/01/2009)    SGOT (AST): 20  (04/01/2009)   SGPT (ALT): 13  (04/01/2009)  CMP ordered    Alkaline phosphatase: 40  (04/01/2009)   Total bilirubin: 0.5  (04/01/2009)    Lipid flowsheet reviewed?: Yes   Progress toward LDL goal: At goal  Hypertension   Last Blood Pressure: 118 / 63  (10/17/2009)   Serum creatinine: 1.00  (04/01/2009)   Serum potassium 4.4  (04/01/2009) CMP ordered     Hypertension flowsheet reviewed?: Yes   Progress toward BP goal: At goal  Self-Management Support :   Personal Goals (by the next clinic visit) :     Personal A1C goal: 7  (10/17/2009)     Personal blood pressure goal: 130/80  (10/17/2009)     Personal LDL goal: 100  (10/17/2009)    Diabetes self-management support: Not documented    Hypertension self-management support: Not documented    Lipid self-management support: Not documented    Nursing Instructions: Diabetic foot exam today

## 2010-09-10 ENCOUNTER — Other Ambulatory Visit: Payer: Self-pay | Admitting: Family Medicine

## 2010-09-10 MED ORDER — METOPROLOL SUCCINATE ER 25 MG PO TB24: 25.0000 mg | ORAL_TABLET | Freq: Every day | ORAL | Status: DC

## 2010-09-24 DIAGNOSIS — I255 Ischemic cardiomyopathy: Secondary | ICD-10-CM

## 2010-09-24 HISTORY — DX: Ischemic cardiomyopathy: I25.5

## 2010-09-28 ENCOUNTER — Encounter: Payer: Self-pay | Admitting: Home Health Services

## 2010-10-06 ENCOUNTER — Other Ambulatory Visit: Payer: Self-pay | Admitting: Family Medicine

## 2010-10-06 NOTE — Telephone Encounter (Signed)
Refill request

## 2010-10-12 ENCOUNTER — Other Ambulatory Visit: Payer: Self-pay | Admitting: Family Medicine

## 2010-10-12 NOTE — Telephone Encounter (Signed)
Refill request

## 2010-10-15 ENCOUNTER — Ambulatory Visit (INDEPENDENT_AMBULATORY_CARE_PROVIDER_SITE_OTHER): Payer: Medicare Other | Admitting: Home Health Services

## 2010-10-15 ENCOUNTER — Encounter: Payer: Self-pay | Admitting: Home Health Services

## 2010-10-15 VITALS — BP 144/78 | HR 58 | Temp 98.5°F | Ht 69.5 in | Wt 223.0 lb

## 2010-10-15 DIAGNOSIS — Z0000 Encounter for general adult medical examination without abnormal findings: Secondary | ICD-10-CM

## 2010-10-15 NOTE — Patient Instructions (Addendum)
1. Continue working out at [employer] 3 times a week and on bike 2 times a week.  2. Continue to work on losing 20 pounds or fit into pants. 3. Enjoy your upcoming cruise!

## 2010-10-15 NOTE — Progress Notes (Signed)
Patient here for annual wellness visit, patient reports: Risk Factors/Conditions needing evaluation or treatment: Patient does not have any risk factors that need evaluation. Diet: Patient has a varied diet of protein, starch, and vegetables. Physical Activity: Patient does water aerobics 3x a week and uses stationary bike 2x a week.  Home Safety: Patient lives in 1 story home with wife.  Patient reports having smoke detectors and does not have adaptive equipment in the bathroom. End of Life Plan: Patient reports having advance directives and will in place. Patient identified his wife, Nikola Marone as his emergency contact at [phone] Other Information: Corrective lens: patient wears corrective lens for reading and sees eye doctor annually.  Dentures: Patient does not have dentures and sees dentist as needed. Seat Belt: Patient reports wearing seat belts when in vehicle. Wynelle Link Protection: Patient reports wearing sun protection. Memory: Patient denies any memory loss.  Balance max value patientvalue  Sitting balance 1 1  Arise 2 1  Attempts to arise 2 2  Immediate standing balance 2 2  Standing balance 1 1  Nudge 2 2  Eyes closed 1 1  360 degree turn 1 1  Sitting down 2 1   Gait max value patient value  Initiation of gait 1 1  Step length-left 1 1  Step length-right 1 1  Step height-left 1 1  Step height-right 1 1  Step symmetry 1 1  Step continuity 1 1  Path 2 2  Trunk 2 2  Walking stance 1 1   Balance/Gait Score: 24/26  Mental Status Exam max value patient value  Orientation to time 5 5  Orientation to place 5 5  Registration 3 3  Attention 5 5  Recall 3 3  Language (name 2 objects) 2 2  Language-repeat 1 1  Language-follow 3 step command 3 3  Language-read and follow directions 1 1   Mental Status Exam: 28/28   Annual Wellness Visit Requirements Recorded Today In  Medical, family, social history Past Medical, Family, Social History Section  Current  providers Care team  Current medications Medications  Wt, BP, Ht, BMI Vital signs  Hearing assessment (welcome visit) Hearing/Vision  Tobacco, alcohol, illicit drug use History  ADL Nurse Assessment  Depression Screening Nurse Assessment  Cognitive impairment Nurse Assessment  Fall Risk Nurse Assessment  Home Safety Progress Note  End of Life Planning (welcome visit) Progress Note  Medicare preventative services Progress Note  Risk factors/conditions needing evaluation/treatment Progress Note  Personalized health advice Patient Instructions, goals, letter    Medicare Prevention Plan: Patient is aware of recommended screenings and has declined a few of them.    Recommended Medicare Prevention Screenings Men over 110 Test For Frequency Date of Last- BOLD if needed  Colorectal Cancer 1-10 yrs declined  Prostate Cancer Never or yearly Sees urologist regularly  Aortic Aneurysm Once if 65-75 with hx of smoking Sees cardiologist regulalry  Cholesterol 5 yrs 03/2009  Diabetes yearly Non diabetic  HIV yearly declined  Influenza Shot yearly 03/2010  Pneumonia Shot once 05/2000  Zostavax Shot once 2011

## 2010-10-21 NOTE — Progress Notes (Signed)
I have reviewed this visit and discussed with Suzanne Lineberry and agree with her documentation  

## 2010-10-21 NOTE — Progress Notes (Signed)
Todd [name],  I tried to create an Addendum to your 3/22 OV for Mr. Favila. When I click on "Addendum" from the upper Left hand EPIC button, the 3/22 visit does not display in the menu of visits to which I may make an Addendum.    Have others had this problem?  Thanks,  JB

## 2010-10-23 ENCOUNTER — Encounter: Payer: Self-pay | Admitting: Family Medicine

## 2010-10-23 ENCOUNTER — Ambulatory Visit (INDEPENDENT_AMBULATORY_CARE_PROVIDER_SITE_OTHER): Payer: Medicare Other | Admitting: Family Medicine

## 2010-10-23 VITALS — BP 148/68 | Temp 98.0°F | Ht 69.5 in | Wt 227.0 lb

## 2010-10-23 DIAGNOSIS — B353 Tinea pedis: Secondary | ICD-10-CM

## 2010-10-23 MED ORDER — KETOCONAZOLE 2 % EX CREA: TOPICAL_CREAM | Freq: Every day | CUTANEOUS | Status: DC

## 2010-10-23 NOTE — Progress Notes (Signed)
  Subjective:    Patient ID: Todd [name], male    DOB: 12-14-1929, 77 y.o.   MRN: [id number]  HPI Patient here for follow up, had an exercise stress test recently with Dr Clarene Duke, is awaiting visit with Dr Clarene Duke for results.  Denies chest pain or dyspnea.   Has some skin irritation on the R foot between the 4 and 5th toes for the last 2 weeks.  Has been swimming at the [facility] pool a lot, thinks this is where it's from.  Not affecting other toes.   Had some sensitivity on the skin over the scalp when brushing hair, is less noticeable now.  Would like it checked.   Planning trip to [place] in mid-April via [address] (cruise).    Review of Systems See HPI    Objective:   Physical Exam Well appearing, NAD.  SCALP: balding; mild generalized erythema over top of crown, with few scattered lesions c/w AK.  No erosions or other lesions noted. No excoriations.  Neck supple.  EXTS: Macerated skin between 4th and 5th digits on R foot, not affecting other toes or L foot.  2+ pitting edema above malleoli bilaterally. Palpable dp pulses.        Assessment & Plan:

## 2010-10-23 NOTE — Patient Instructions (Signed)
It was a pleasure to see you today.   I believe the skin changes on your R foot is due to fungal infection. I sent the Ketoconazole cream to [facility] pharmacy.  Apply once daily until 5 days after skin clears.    Be sure to use your sunscreen, even when you're not in [place]!   I would appreciate the copies of the results of your stress test with Dr Clarene Duke.

## 2010-10-23 NOTE — Assessment & Plan Note (Addendum)
Maceration between 4 and 5th digits R foot.  Nizoral cream once daily after bathing. Stop after 5 days of clearing.

## 2010-10-27 ENCOUNTER — Other Ambulatory Visit (INDEPENDENT_AMBULATORY_CARE_PROVIDER_SITE_OTHER): Payer: Medicare Other | Admitting: Family Medicine

## 2010-10-27 DIAGNOSIS — I2510 Atherosclerotic heart disease of native coronary artery without angina pectoris: Secondary | ICD-10-CM

## 2010-10-27 NOTE — Telephone Encounter (Signed)
Refill request

## 2011-01-04 ENCOUNTER — Telehealth: Payer: Self-pay | Admitting: Family Medicine

## 2011-01-04 NOTE — Telephone Encounter (Signed)
Woke up this AM with right sided face swollen and it has now moved and his lip is swollen and not painful, but uncomfortable.  Needs to talk to nurse to determine if he needs to have it looked at.

## 2011-01-04 NOTE — Telephone Encounter (Signed)
States swelling in face has improved but states lip swelling seems  increased . Advised patient that he should go to Urgent care now. He voices understanding.

## 2011-01-26 ENCOUNTER — Other Ambulatory Visit: Payer: Self-pay | Admitting: Family Medicine

## 2011-01-26 NOTE — Telephone Encounter (Signed)
Refill request

## 2011-02-21 ENCOUNTER — Ambulatory Visit (INDEPENDENT_AMBULATORY_CARE_PROVIDER_SITE_OTHER): Payer: Medicare Other

## 2011-02-21 ENCOUNTER — Inpatient Hospital Stay (INDEPENDENT_AMBULATORY_CARE_PROVIDER_SITE_OTHER)
Admission: RE | Admit: 2011-02-21 | Discharge: 2011-02-21 | Disposition: A | Discharge status: Home / Self Care | Payer: Medicare Other | Source: Ambulatory Visit | Attending: Family Medicine | Admitting: Family Medicine

## 2011-02-21 DIAGNOSIS — S92919A Unspecified fracture of unspecified toe(s), initial encounter for closed fracture: Secondary | ICD-10-CM

## 2011-04-01 ENCOUNTER — Other Ambulatory Visit: Payer: Self-pay | Admitting: Sports Medicine

## 2011-04-26 ENCOUNTER — Other Ambulatory Visit: Payer: Self-pay | Admitting: Family Medicine

## 2011-04-26 NOTE — Telephone Encounter (Signed)
Refill request

## 2011-05-26 ENCOUNTER — Ambulatory Visit (INDEPENDENT_AMBULATORY_CARE_PROVIDER_SITE_OTHER): Payer: Medicare Other | Admitting: Family Medicine

## 2011-05-26 ENCOUNTER — Encounter: Payer: Self-pay | Admitting: Family Medicine

## 2011-05-26 VITALS — BP 164/77 | HR 60 | Temp 97.5°F | Ht 70.0 in | Wt 215.0 lb

## 2011-05-26 DIAGNOSIS — M25521 Pain in right elbow: Secondary | ICD-10-CM | POA: Insufficient documentation

## 2011-05-26 DIAGNOSIS — M25529 Pain in unspecified elbow: Secondary | ICD-10-CM

## 2011-05-26 MED ORDER — PREDNISONE 20 MG PO TABS: ORAL_TABLET | ORAL | Status: DC

## 2011-05-26 MED ORDER — COLCHICINE 0.6 MG PO TABS: 0.6000 mg | ORAL_TABLET | Freq: Two times a day (BID) | ORAL | Status: DC

## 2011-05-26 MED ORDER — HYDROCODONE-ACETAMINOPHEN 5-500 MG PO TABS: 1.0000 | ORAL_TABLET | Freq: Four times a day (QID) | ORAL | Status: AC | PRN

## 2011-05-26 NOTE — Assessment & Plan Note (Signed)
2/2 gout flare.  No fever, erythema, skin breakdown to suggest septic joint.  No systemic symptoms.  Discussed treatment options - only mild improvement with voltaren.  Colchicine has been tried in past but he stated he hasn't noticed much benefit before.  Prednisone has worked - discussed side effects but this is best bet to help him symptomatically - will start 10 day taper.  Can take colchicine in addition to this twice a day - discussed myalgia risk as he is on a statin and diarrhea risk.  Vicodin to have in hand in case he needs to fill something for severe pain if these aren't working - he will try tylenol first.  No driving on vicodin, discussed somnolence and constipation risk.  Reassess in 1-2 weeks, call if develops a fever or worsening symptoms.

## 2011-05-26 NOTE — Patient Instructions (Addendum)
Start prednisone - 2 tablets daily for 5 days then 1 tablet daily for 5 days then stop. Day after finishing prednisone you can go back to taking the voltaren as you were before. Colchicine is another medicine you can take in addition to prednisone twice a day until flare is over; with you being on a cholesterol medicine it can increase your risk for muscle soreness/pain - if it does, stop this medicine. Take tylenol 500mg  1-2 tabs three times a day as needed for pain. Only fill the vicodin if the above medicines aren't working for you - you would take this in place of tylenol if you took it. Icing as needed for pain. Follow up with me or Dr. Darrick Penna in 1-2 weeks to recheck how you're doing (or call me).

## 2011-05-26 NOTE — Progress Notes (Signed)
Subjective:    Patient ID: Todd [name], male    DOB: 1930/04/07, 77 y.o.   MRN: [id number]  PCP: Dr. Mauricio Po  HPI Patient is a 77 yo M here for right elbow pain, swelling.  Patient has prior history of several gout flares - reports most recent one was 4 weeks ago in right hand that resolved on its own. Then on Thursday evening without injury started developing right elbow soreness, pain with evolving warmth and swelling. No breaks in skin, fevers, erythema noted. Pain with elbow movements - feels like his prior gout flares. Trying voltaren orally and voltaren gel with mild improvement. Has had prednisone for flares before. Tried allopurinol but did not tolerate this due to side effects; did not want uloric because of possible side effects. Tolerated colchicine in the past.  Past Medical History  Diagnosis Date  . Myocardial infarction   . Gout     Current Outpatient Prescriptions on File Prior to Visit  Medication Sig Dispense Refill  . aspirin 81 MG tablet Take 81 mg by mouth daily.        . benazepril (LOTENSIN) 40 MG tablet TAKE 1 TABLET BY MOUTH ONCE A DAY  90 tablet  3  . diclofenac (CATAFLAM) 50 MG tablet Take 1 tablet as needed       . diclofenac sodium (VOLTAREN) 1 % GEL Apply to affected area up to four times daily       . doxazosin (CARDURA) 8 MG tablet Take 8 mg by mouth daily.        . felodipine (PLENDIL) 5 MG 24 hr tablet Take 5 mg by mouth daily.        . Glucosamine-Chondroit-Vit C-Mn (GLUCOSAMINE CHONDR 1500 COMPLX PO) Take 1 capsule by mouth 3 (three) times daily.        . hydrochlorothiazide 25 MG tablet TAKE 1 TABLET BY MOUTH ONCE A DAY  90 tablet  4  . isosorbide mononitrate (IMDUR) 60 MG 24 hr tablet TAKE 1 TABLET BY MOUTH ONCE A DAY  90 tablet  0  . loratadine (CLARITIN) 10 MG tablet Take 10 mg by mouth daily.        . metoprolol (TOPROL-XL) 25 MG 24 hr tablet Take 1 tablet (25 mg total) by mouth daily.  90 tablet  6  . mometasone (ELOCON) 0.1 % cream Apply  topically daily.        . Multiple Vitamin (MULTIVITAMIN) tablet Take 1 tablet by mouth daily.        . nitroGLYCERIN (NITRODUR) 0.2 MG/HR Place 1 patch onto the skin daily.        . nitroGLYCERIN (NITROSTAT) 0.4 MG SL tablet Place 0.4 mg under the tongue every 5 (five) minutes as needed.        . Omega-3 Fatty Acids (FISH OIL DOUBLE STRENGTH) 1200 MG CAPS Take 1 capsule by mouth 2 (two) times daily.        . simvastatin (ZOCOR) 40 MG tablet TAKE 1 TABLET BY MOUTH ATBEDTIME  90 tablet  3  . Specialty Vitamins Products (UDAMIN) TABS Take 1 tablet by mouth daily.        Marland Kitchen zoster vaccine live, PF, (ZOSTAVAX) 42595 UNT/0.65ML injection Inject into the skin once. Dispense to patient to be admin in doctors office         History reviewed. No pertinent past surgical history.  Allergies  Allergen Reactions  . Digoxin     REACTION: bradycardia      Family History  Problem Relation Age of Onset  . Stroke Father   . Diabetes Father   . Hypertension Father   . Heart attack Father   . Hyperlipidemia Neg Hx   . Sudden death Neg Hx     BP 164/77  Pulse 60  Temp(Src) 97.5 F (36.4 C) (Oral)  Ht 5\' 10"  (1.778 m)  Wt 215 lb (97.523 kg)  BMI 30.85 kg/m2  Review of Systems See HPI above.    Objective:   Physical Exam Gen: NAD  R elbow: Mod swelling, warmth over right elbow.  No erythema. ROM - lacks 30 degrees extension, 20 degrees flexion - painful motions. No focal bony TTP - mod diffuse soft tissue TTP but no induration. Collateral ligaments intact. NVI distally.  L elbow: FROM without pain, swelling.   Assessment & Plan:  1. Right elbow pain - 2/2 gout flare.  No fever, erythema, skin breakdown to suggest septic joint.  No systemic symptoms.  Discussed treatment options - only mild improvement with voltaren.  Colchicine has been tried in past but he stated he hasn't noticed much benefit before.  Prednisone has worked - discussed side effects but this is best bet to help him  symptomatically - will start 10 day taper.  Can take colchicine in addition to this twice a day - discussed myalgia risk as he is on a statin and diarrhea risk.  Vicodin to have in hand in case he needs to fill something for severe pain if these aren't working - he will try tylenol first.  No driving on vicodin, discussed somnolence and constipation risk.  Reassess in 1-2 weeks, call if develops a fever or worsening symptoms.

## 2011-06-02 ENCOUNTER — Encounter: Payer: Self-pay | Admitting: Family Medicine

## 2011-06-02 ENCOUNTER — Ambulatory Visit (INDEPENDENT_AMBULATORY_CARE_PROVIDER_SITE_OTHER): Payer: Medicare Other | Admitting: Family Medicine

## 2011-06-02 VITALS — BP 122/58 | HR 64 | Temp 97.8°F | Ht 70.0 in | Wt 224.0 lb

## 2011-06-02 DIAGNOSIS — I5020 Unspecified systolic (congestive) heart failure: Secondary | ICD-10-CM

## 2011-06-02 DIAGNOSIS — M109 Gout, unspecified: Secondary | ICD-10-CM

## 2011-06-02 DIAGNOSIS — I5022 Chronic systolic (congestive) heart failure: Secondary | ICD-10-CM

## 2011-06-02 DIAGNOSIS — N400 Enlarged prostate without lower urinary tract symptoms: Secondary | ICD-10-CM

## 2011-06-02 DIAGNOSIS — E119 Type 2 diabetes mellitus without complications: Secondary | ICD-10-CM

## 2011-06-02 LAB — COMPREHENSIVE METABOLIC PANEL
ALT: 20 U/L (ref 0–53)
Albumin: 4 g/dL (ref 3.5–5.2)
Alkaline Phosphatase: 58 U/L (ref 39–117)
CO2: 29 mEq/L (ref 19–32)
Glucose, Bld: 140 mg/dL — ABNORMAL HIGH (ref 70–99)
Potassium: 4 mEq/L (ref 3.5–5.3)
Sodium: 139 mEq/L (ref 135–145)
Total Protein: 7.1 g/dL (ref 6.0–8.3)

## 2011-06-02 LAB — POCT GLYCOSYLATED HEMOGLOBIN (HGB A1C): Hemoglobin A1C: 6

## 2011-06-02 LAB — CBC
Hemoglobin: 13.2 g/dL (ref 13.0–17.0)
RBC: 4.5 MIL/uL (ref 4.22–5.81)

## 2011-06-02 LAB — URIC ACID: Uric Acid, Serum: 7.9 mg/dL — ABNORMAL HIGH (ref 4.0–7.8)

## 2011-06-02 MED ORDER — METOPROLOL SUCCINATE ER 50 MG PO TB24: 50.0000 mg | ORAL_TABLET | Freq: Every day | ORAL | Status: DC

## 2011-06-02 NOTE — Assessment & Plan Note (Signed)
Discussion regarding AICD, which he is clear that he would not want.  Has discussed in the past with his cardiologist, Dr. Clarene Duke at [facility]. Continue current regimen.  He had been on Toprol XL 25mg  tablets; finds it more convenient to get the 50mg  tablets instead.  Will prescribe.

## 2011-06-02 NOTE — Patient Instructions (Signed)
It was a pleasure to see you today.  I will contact you with the results of the the bloodwork.   I sent a prescription for Toprol XL 50mg  tablets; the instructions will read, "Take 1 tablet by mouth one time daily".

## 2011-06-02 NOTE — Progress Notes (Signed)
  Subjective:    Patient ID: Todd [name], male    DOB: 04-04-1930, 77 y.o.   MRN: [id number]  HPI Todd [name] is back for a follow up visit.  Had a gout flare in R elbow recently, was seen by Dr Pearletha Forge at [facility] and started on Prednisone 40mg  daily; will conclude a 10 day course on 11/09.  Is much better.   Has felt well otherwise; had a great toe fracture and seen at Urgent Care, doing well now.  Wearing regular sandals today.  Seen by his cardiologist Dr Clarene Duke in the Spring for his CHF; reports that he had conversation about AICD and he would not want one.  Wants to be considered DNR/DNI, sees the idea of defibrillation as futile.  States he has a living will in writing that addresses this.  Also states he would not want a screening colonoscopy; cost and inconvenience/discomfort do not justify the risks for him at his age, he states.  Plans to see Dr Patsi Sears, his urologist, for discussion about procedure to treat his BPH, which causes significant nocturia.    Review of Systems  No chest pain; mildly less energy than he had 5 years ago. No fevers or chills.     Objective:   Physical Exam Well appearing, no apparent distress. HEENT Neck supple, no cervical adenopathy. SKIN: Several AKs and seb keratoses along face, back, chest.   COR Holosystolic murmur, N8G9.  PULM Clear bilaterally.  LEs no notable LE edema; Loss of nail on L great toe. Palpable dp pulses bilaterally. No lesions or wounds, no interdigitary maceration.        Assessment & Plan:

## 2011-06-02 NOTE — Assessment & Plan Note (Signed)
A1C 6% today.  To continue with current regimen.  Prednisone may raise his serum glucose today, but shouldn't really affect the A1C done today and mildly elevated from last value.

## 2011-06-02 NOTE — Assessment & Plan Note (Signed)
Plans to see Dr Patsi Sears to address.

## 2011-06-03 ENCOUNTER — Other Ambulatory Visit: Payer: Self-pay | Admitting: Family Medicine

## 2011-06-03 ENCOUNTER — Encounter: Payer: Self-pay | Admitting: Family Medicine

## 2011-06-03 ENCOUNTER — Telehealth: Payer: Self-pay | Admitting: Family Medicine

## 2011-06-03 NOTE — Telephone Encounter (Signed)
Called patient at [facility] to his cell phone [phone].  Left non-urgent cell phone message for him there.  Will send letter to him regarding his labs.  Todd [name]

## 2011-06-29 ENCOUNTER — Ambulatory Visit (INDEPENDENT_AMBULATORY_CARE_PROVIDER_SITE_OTHER): Payer: Medicare Other | Admitting: Sports Medicine

## 2011-06-29 VITALS — BP 128/60

## 2011-06-29 DIAGNOSIS — M109 Gout, unspecified: Secondary | ICD-10-CM

## 2011-06-29 MED ORDER — NITROGLYCERIN 0.2 MG/HR TD PT24: 1.0000 | MEDICATED_PATCH | Freq: Every day | TRANSDERMAL | Status: DC

## 2011-06-29 MED ORDER — MOMETASONE FUROATE 0.1 % EX CREA: TOPICAL_CREAM | Freq: Every day | CUTANEOUS | Status: DC

## 2011-06-29 MED ORDER — DICLOFENAC SODIUM 75 MG PO TBEC: 75.0000 mg | DELAYED_RELEASE_TABLET | Freq: Two times a day (BID) | ORAL | Status: AC

## 2011-06-29 NOTE — Patient Instructions (Signed)
Your Gout Tophi appear to be getting worse.  Continue the Voltaren, but take it daily for the next 3 weeks.  I have printed you out a handout about Krystexxa, review it and bring it to Dr. Mauricio Po for evaluation.

## 2011-06-29 NOTE — Progress Notes (Signed)
Subjective:    Patient ID: Todd [name], male    DOB: [birth year], 77 y.o.   MRN: [id number]  PCP: Dr. Mauricio Po  HPI Patient is a 77 yo M here for follow up of right elbow pain, swelling.  Patient has prior history of several gout flares - reports most recent one was 8 weeks ago in right hand that resolved on its own.  No current pain with elbow movements  Trying voltaren orally and voltaren gel with mild improvement. Has had prednisone for flares before. Tried allopurinol but did not tolerate this due to side effects; did not want uloric because of possible side effects. Did not tolerate colchicine either, caused intense diarrhea.  Past Medical History  Diagnosis Date  . Myocardial infarction   . Gout     Current Outpatient Prescriptions on File Prior to Visit  Medication Sig Dispense Refill  . aspirin 81 MG tablet Take 81 mg by mouth daily.        . benazepril (LOTENSIN) 40 MG tablet TAKE 1 TABLET BY MOUTH ONCE A DAY  90 tablet  3  . diclofenac (CATAFLAM) 50 MG tablet Take 1 tablet as needed       . diclofenac sodium (VOLTAREN) 1 % GEL Apply to affected area up to four times daily       . doxazosin (CARDURA) 8 MG tablet Take 8 mg by mouth daily.        . felodipine (PLENDIL) 5 MG 24 hr tablet Take 5 mg by mouth daily.        . Glucosamine-Chondroit-Vit C-Mn (GLUCOSAMINE CHONDR 1500 COMPLX PO) Take 1 capsule by mouth 3 (three) times daily.        . isosorbide mononitrate (IMDUR) 60 MG 24 hr tablet TAKE 1 TABLET BY MOUTH ONCE A DAY  90 tablet  0  . loratadine (CLARITIN) 10 MG tablet Take 10 mg by mouth daily.        . metoprolol (TOPROL XL) 50 MG 24 hr tablet Take 1 tablet (50 mg total) by mouth daily.  90 tablet  3  . Multiple Vitamin (MULTIVITAMIN) tablet Take 1 tablet by mouth daily.        . nitroGLYCERIN (NITROSTAT) 0.4 MG SL tablet Place 0.4 mg under the tongue every 5 (five) minutes as needed.        . Omega-3 Fatty Acids (FISH OIL DOUBLE STRENGTH) 1200 MG CAPS Take 1 capsule  by mouth 2 (two) times daily.        . predniSONE (DELTASONE) 20 MG tablet 2 tabs po daily x 5 days then 1 tab po daily x 5 days  15 tablet  0  . simvastatin (ZOCOR) 40 MG tablet TAKE 1 TABLET BY MOUTH ATBEDTIME  90 tablet  3  . Specialty Vitamins Products (UDAMIN) TABS Take 1 tablet by mouth daily.        Marland Kitchen zoster vaccine live, PF, (ZOSTAVAX) 78295 UNT/0.65ML injection Inject into the skin once. Dispense to patient to be admin in doctors office       . DISCONTD: mometasone (ELOCON) 0.1 % cream Apply topically daily.        Marland Kitchen DISCONTD: nitroGLYCERIN (NITRODUR) 0.2 MG/HR Place 1 patch onto the skin daily.          No past surgical history on file.  Allergies  Allergen Reactions  . Digoxin     REACTION: bradycardia      Family History  Problem Relation Age of Onset  . Stroke Father   .  Diabetes Father   . Hypertension Father   . Heart attack Father   . Hyperlipidemia Neg Hx   . Sudden death Neg Hx     BP 128/60  Review of Systems See HPI above.    Objective:   Physical Exam Gen: NAD  R elbow: No swelling, no warmth over right elbow.  No erythema. Tophi palpable ROM - wnl No focal bony TTP  Collateral ligaments intact. NVI distally.  L elbow: FROM without pain, swelling.   Assessment & Plan:

## 2011-07-22 ENCOUNTER — Other Ambulatory Visit: Payer: Self-pay | Admitting: Family Medicine

## 2011-08-10 ENCOUNTER — Ambulatory Visit (INDEPENDENT_AMBULATORY_CARE_PROVIDER_SITE_OTHER): Payer: Medicare Other | Admitting: Sports Medicine

## 2011-08-10 VITALS — BP 128/60

## 2011-08-10 DIAGNOSIS — M7020 Olecranon bursitis, unspecified elbow: Secondary | ICD-10-CM

## 2011-08-10 DIAGNOSIS — M25551 Pain in right hip: Secondary | ICD-10-CM

## 2011-08-10 DIAGNOSIS — M109 Gout, unspecified: Secondary | ICD-10-CM

## 2011-08-10 DIAGNOSIS — M25559 Pain in unspecified hip: Secondary | ICD-10-CM

## 2011-08-10 MED ORDER — FELODIPINE ER 5 MG PO TB24: 5.0000 mg | ORAL_TABLET | Freq: Every day | ORAL | Status: DC

## 2011-08-10 MED ORDER — KETOCONAZOLE 2 % EX CREA: TOPICAL_CREAM | Freq: Every day | CUTANEOUS | Status: AC

## 2011-08-10 NOTE — Patient Instructions (Signed)
Please start suggested hip exercises daily  Please follow up in 6 weeks  Thank you for seeing [place] today!

## 2011-08-10 NOTE — Assessment & Plan Note (Signed)
He has some significant weakness in the right hip although the joint itself seems normal. The witnesses limited to hip abduction. We gave him a series of exercises to strengthen the right hip. I would think he should see a significant increase in strength and more stability within 6 weeks. I would like to recheck him at that time.

## 2011-08-10 NOTE — Assessment & Plan Note (Signed)
He has a number of chronic changes that are probably residual of his gout including joint nodules and degenerative changes in the great right and left. The left great toe has developed significant hallux rigidus and may require some cushioning if this becomes painful.

## 2011-08-10 NOTE — Progress Notes (Signed)
S/ Pt here today to discuss his R hip pain that has been bothering him since yesterday. Pt states he is also here to discuss meds that he was on for his elbow, which has help a lot.  Taking glucosamine chondroitin, has not had prednisone recently, has only taken this twice ever for gout.   Going to water aerobics three times per week.   States he has not had significant problems with gout since his last visit. Currently he is using diclofenac 75 mg to control his gout and he gets less constipation than he did on the lower dose of Cataflam. He does use a Cataflam occasionally if he gets a flare and needs to boost his total dose.    O/  NAD Prominence of olecranon bursa on lt, not on rt No swelling over rt lat epicondyle  Hands: Rt hand has a few nodules at PIP joints, MCP, and DIP - none are inflamed, no swelling of joints Lt hand- swelling of 2nd and 3rd MCP, 3rd PIP has large nodule Full grip flexion bilat  Lt great toe subluxation with hallux rigidus 5 deg flexion, 20 deg extension on lt 20 deg flexion, 30 deg extension on rt Slight swelling of both ankles Trace edema bilat lower legs   Hip exam: 30 deg IR and 35 deg ER on lt 30 deg IR and 35 deg ER on rt  Rt hip motion good  Hip flexion and rotation strong Weak hip abduction on rt Weak hip abduction on lt

## 2011-08-10 NOTE — Assessment & Plan Note (Signed)
Today the right olecranon bursa is normal for evaluation and all signs of inflammation seemed to have resolved.

## 2011-09-21 ENCOUNTER — Ambulatory Visit (INDEPENDENT_AMBULATORY_CARE_PROVIDER_SITE_OTHER): Payer: Medicare Other | Admitting: Sports Medicine

## 2011-09-21 VITALS — BP 140/60

## 2011-09-21 DIAGNOSIS — M25551 Pain in right hip: Secondary | ICD-10-CM

## 2011-09-21 DIAGNOSIS — M25559 Pain in unspecified hip: Secondary | ICD-10-CM

## 2011-09-21 DIAGNOSIS — M109 Gout, unspecified: Secondary | ICD-10-CM

## 2011-09-21 MED ORDER — GABAPENTIN 100 MG PO CAPS: ORAL_CAPSULE | ORAL | Status: DC

## 2011-09-21 NOTE — Assessment & Plan Note (Signed)
No recent flares and has dropped diclofenac dose to 75mg  qod

## 2011-09-21 NOTE — Assessment & Plan Note (Signed)
Mild hip adductor strain on the R. Will place on lighter hip strengthening regimen. Discussed use of neurontin as there was some concern for some neuropathic contribution of pain. Medication was called in if pt felt he developed any radicular sxs. WIll follow in 4-6 weeks.

## 2011-09-21 NOTE — Progress Notes (Signed)
  Subjective:    Patient ID: Todd [name], male    DOB: 1929-09-30, 78 y.o.   MRN: [id number]  HPI Pt presents today for follow up of R hip pain. Pt is noted to have multiple medical problems including gout and lumbar degenerative disease. Pt was recently placed on regimen of general hip and hip abductor strengthening. Pt was recently seen 07/2011. Pt states that while he was doing regimen he added a 1 pound weight to his leg and strained his groin. Pt  denies significantany radicular symptoms or paresthesias.   Review of Systems See HPI, otherwise ROS negative     Objective:   Physical Exam Gen: in bed, NAD MSK: Hip: ROM Hip exam:  30 deg IR and 35 deg ER on lt  30 deg IR and 35 deg ER on rt  Rt hip motion good  Hip flexion and rotation strong  Weak hip abduction on rt  Weak hip abduction on lt Pelvic alignment unremarkable to inspection and palpation. No tenderness over piriformis and greater trochanter. No SI joint tenderness and normal minimal SI movement.    Assessment & Plan:

## 2011-10-04 ENCOUNTER — Other Ambulatory Visit: Payer: Self-pay | Admitting: Family Medicine

## 2011-10-04 NOTE — Telephone Encounter (Signed)
Please call patient to ask if he is taking Benazepril 40mg  daily for his blood pressure. I see that this medication was stopped on December 2012 during a visit with Sports Medicine.   Thank you,  JB

## 2011-10-04 NOTE — Telephone Encounter (Signed)
Refill request

## 2011-10-13 ENCOUNTER — Telehealth: Payer: Self-pay | Admitting: Family Medicine

## 2011-10-13 NOTE — Telephone Encounter (Signed)
Pt states he is taking benazepril daily.

## 2011-10-15 ENCOUNTER — Encounter: Payer: Self-pay | Admitting: Home Health Services

## 2011-10-21 ENCOUNTER — Other Ambulatory Visit: Payer: Self-pay | Admitting: Family Medicine

## 2011-10-26 ENCOUNTER — Ambulatory Visit (INDEPENDENT_AMBULATORY_CARE_PROVIDER_SITE_OTHER): Payer: Medicare Other | Admitting: Sports Medicine

## 2011-10-26 VITALS — BP 142/60

## 2011-10-26 DIAGNOSIS — M25551 Pain in right hip: Secondary | ICD-10-CM

## 2011-10-26 DIAGNOSIS — M25559 Pain in unspecified hip: Secondary | ICD-10-CM

## 2011-10-26 MED ORDER — GABAPENTIN 100 MG PO CAPS: ORAL_CAPSULE | ORAL | Status: DC

## 2011-10-26 NOTE — Patient Instructions (Signed)
I want you to take 100mg  of gabapentin for three days then 200mg  for three days, then 300mg  daily.  Take it at night before you go to bed.  Give [place] a call in about 10 days to let us know how you are doing.

## 2011-10-26 NOTE — Progress Notes (Signed)
Patient ID: Todd [name], male   DOB: 16-Mar-1930, 78 y.o.   MRN: [id number] The patient is a 78 year old male who presents today for followup of his gout and hip pain. He reports that his hip, specifically his right hip, has continued to be bothersome. He continues to take Tylenol and use topical Voltaren gel for his discomfort. These are somewhat effective. He had decided to hold off on starting gabapentin last visit. He also reports that a prescription was sent to the wrong pharmacy. He is leaving for [place] in 2 weeks and is interested in getting the medication a try prior to then. He continues in his exercise program.    Pain tends to radiate down the outside of RT leg to level of calf  Objective: Gen: Overweight male, NAD Hip: Pt has poor strength in hip abductorss bilaterally with right weaker than left. Other hip MM strong 60 degrees external rotation of hip on the right, 60 degrees on left 20 degrees internal rotation of hip on on right, 25 degrees on left Negative straight leg raise  Assessment/Plan: Right hip pain and weakness, possibly neurogenic in origin originating from low back.  We will start the patient on low dose neurontin and titrate up gradually.  Will touch base with patient in 10 days to see how he is doing at that time.

## 2011-10-26 NOTE — Assessment & Plan Note (Signed)
See assessment This is likely not all muscular with some neurogenic weakness Follow plan as outlined  If Gabapentin is helpful we will titrate up gradually to what dose is required  Reck 6 wks

## 2011-11-01 ENCOUNTER — Other Ambulatory Visit: Payer: Self-pay | Admitting: Family Medicine

## 2012-01-02 ENCOUNTER — Other Ambulatory Visit: Payer: Self-pay | Admitting: Family Medicine

## 2012-01-18 ENCOUNTER — Ambulatory Visit (INDEPENDENT_AMBULATORY_CARE_PROVIDER_SITE_OTHER): Payer: Medicare Other | Admitting: Family Medicine

## 2012-01-18 ENCOUNTER — Encounter: Payer: Self-pay | Admitting: Family Medicine

## 2012-01-18 VITALS — BP 133/64 | HR 59 | Ht 70.0 in | Wt 220.0 lb

## 2012-01-18 DIAGNOSIS — I5022 Chronic systolic (congestive) heart failure: Secondary | ICD-10-CM

## 2012-01-18 DIAGNOSIS — N400 Enlarged prostate without lower urinary tract symptoms: Secondary | ICD-10-CM

## 2012-01-18 DIAGNOSIS — I10 Essential (primary) hypertension: Secondary | ICD-10-CM

## 2012-01-18 DIAGNOSIS — E119 Type 2 diabetes mellitus without complications: Secondary | ICD-10-CM

## 2012-01-18 DIAGNOSIS — M109 Gout, unspecified: Secondary | ICD-10-CM

## 2012-01-18 MED ORDER — COLCHICINE 0.6 MG PO TABS: ORAL_TABLET | ORAL | Status: DC

## 2012-01-18 MED ORDER — BENAZEPRIL HCL 40 MG PO TABS: 40.0000 mg | ORAL_TABLET | Freq: Every day | ORAL | Status: DC

## 2012-01-18 MED ORDER — SIMVASTATIN 40 MG PO TABS: 40.0000 mg | ORAL_TABLET | Freq: Every day | ORAL | Status: DC

## 2012-01-18 MED ORDER — HYDROCHLOROTHIAZIDE 25 MG PO TABS: 25.0000 mg | ORAL_TABLET | Freq: Every day | ORAL | Status: DC

## 2012-01-18 MED ORDER — ISOSORBIDE MONONITRATE ER 60 MG PO TB24: 60.0000 mg | ORAL_TABLET | Freq: Every day | ORAL | Status: DC

## 2012-01-18 NOTE — Patient Instructions (Addendum)
It was a pleasure to see you today.    For the gouty flare, I have sent a prescriptionfor the colchicine (name brand (colchrys) 0.6mg  tablet; take 1 tablet every 4 hours until the gout attack subsides or until you get diarrhea/GI upset.  Refills on your other medicines.   I ordered labs to check your cholesterol and chemistry panel to be done after 8 hours of fasting.

## 2012-01-19 NOTE — Progress Notes (Signed)
  Subjective:    Patient ID: Todd [name], male    DOB: 06-04-1930, 78 y.o.   MRN: [id number]  HPI Presents today with acute gouty attack that is worst at ulnar aspect of right wrist; started the day before visit.  Red, hot and tender.  Also some involvement of left palmar surface of hand along the 2nd-4th MCPs.  No fevers or chills.  Feels to him like his typical gouty attack.  Not taking colchicine because of GI side effects (had been taking daily as preventive measure in the past).  Is taking diclofenac 150mg  (2 tabs of 75mg ) with mild relief.    Saw Dr. Patsi Sears (urology) who recently changed his BPH meds, is recommend prostate microwave treatment for shrinking of prostate (?TVP).  He is not sure if he wishes to proceed with this.     Review of Systems No changes in his occasional mild chest pain; has seen his cardiologist recently (Dr. Clarene Duke), who is retiring. No dyspnea.  Otherwise feeling well. Has some occasional constipation when taking the diclofenac; no blood in stool.  Discussed prior screening for colon cancer with colonoscopy, patient voices skepticism about necessity of colon cancer screening and has declined screening (now over age 53, thus outside the age range for CRC screening protocol).     Objective:   Physical Exam Well appearing, no apparent distress.  MSK: R wrist (ulnar aspect) with calor/rubor/tumor/dolor.  Is able to flex/extend/pronate/supinate @wrist  joints bilaterally. Full hand grip bilat and symmetric. Mild erythema along right palmar MCP joints from 2nd to 4th digits. Palpable radial pulses bilaterally.  Sensation grossly intact bilateral fingertips.  COR regular S1S2, no extra sounds PULM Clear bilat, no rales or wheezes.        Assessment & Plan:

## 2012-01-19 NOTE — Assessment & Plan Note (Signed)
Followed by Dr. Patsi Sears.  Med changes noted in Epic.  Patient considering surgical intervention for BPH.

## 2012-01-19 NOTE — Assessment & Plan Note (Signed)
Good control today.  Discussed idea of d/cing HCTZ, in light of his gouty attack.  He wishes to continue it as a chronic med but agrees to stop for a few days until gouty flare has passed.

## 2012-01-19 NOTE — Assessment & Plan Note (Signed)
He is followed by Dr. Clarene Duke, cardiology, whom patient reports has offered him an AICD>  He declines this.

## 2012-01-19 NOTE — Assessment & Plan Note (Signed)
A1C<6% today.  He wishes to do lipid panel as future-order when he is fasting.  Saw ophthalmologist last week, had dilated retinal exam that was negative for retinopathy.  (Updated in Preventive field).

## 2012-01-19 NOTE — Assessment & Plan Note (Signed)
In the throes of acute gouty attack.  He is amenable to taking colchicine as a rescue med when having a gouty attack; will titrate to GI symptoms.  Also, he is aware that the HCTZ may be exacerbating his gouty flares; he wishes to keep HCTZ as a regular med despite this, but will hold it for the next several days until the gouty attack resolves.

## 2012-03-21 ENCOUNTER — Other Ambulatory Visit: Payer: Self-pay | Admitting: Family Medicine

## 2012-03-23 ENCOUNTER — Encounter: Payer: Self-pay | Admitting: Family Medicine

## 2012-03-23 ENCOUNTER — Ambulatory Visit (INDEPENDENT_AMBULATORY_CARE_PROVIDER_SITE_OTHER): Payer: Medicare Other | Admitting: Family Medicine

## 2012-03-23 VITALS — BP 128/73 | HR 85 | Ht 70.0 in | Wt 211.0 lb

## 2012-03-23 DIAGNOSIS — M7020 Olecranon bursitis, unspecified elbow: Secondary | ICD-10-CM

## 2012-03-23 NOTE — Assessment & Plan Note (Signed)
Right elbow olecranon bursitis - has done very well over past couple days with aggressive icing - nothing at this point to aspirate or inject - advised him would recommend against this generally anyway unless having a lot of pain due to the swelling.  Body helix compression sleeve provided and felt comfortable.  Elevate, ice as much as possible.  F/u prn.

## 2012-03-23 NOTE — Progress Notes (Signed)
Subjective:    Patient ID: Todd [name], male    DOB: [birth year], 78 y.o.   MRN: [id number]  PCP: Dr. Mauricio Po  HPI 78 yo M here for right elbow swelling.  Patient has known h/o gout. Was seen about a year ago for right elbow swelling. States over past 2 days has had swelling focally over right elbow - daughter (a PA) told him he had bursitis. He lost elbow sleeve he had previously. Has been icing and swelling has gone down quite a bit. Minimal pain. No pain with motions of elbow currently.  Past Medical History  Diagnosis Date  . Myocardial infarction   . Gout     Current Outpatient Prescriptions on File Prior to Visit  Medication Sig Dispense Refill  . aspirin 81 MG tablet Take 81 mg by mouth daily.        . benazepril (LOTENSIN) 40 MG tablet Take 1 tablet (40 mg total) by mouth daily.  90 tablet  3  . colchicine (COLCRYS) 0.6 MG tablet Take 1 tablet by mouth every 4 hours until gouty attack subsides, or until diarrhea/GI upset.  30 tablet  2  . diclofenac (CATAFLAM) 50 MG tablet Take 1 tablet as needed       . diclofenac (VOLTAREN) 75 MG EC tablet Take 1 tablet (75 mg total) by mouth 2 (two) times daily.  62 tablet  6  . doxazosin (CARDURA) 8 MG tablet Take 8 mg by mouth daily.        . felodipine (PLENDIL) 5 MG 24 hr tablet Take 1 tablet (5 mg total) by mouth daily.  90 tablet  3  . gabapentin (NEURONTIN) 100 MG capsule Take 1 to 3 tablets at night as directed.  90 capsule  3  . Glucosamine-Chondroit-Vit C-Mn (GLUCOSAMINE CHONDR 1500 COMPLX PO) Take 1 capsule by mouth 3 (three) times daily.        . hydrochlorothiazide (HYDRODIURIL) 25 MG tablet Take 1 tablet (25 mg total) by mouth daily.  90 tablet  3  . isosorbide mononitrate (IMDUR) 60 MG 24 hr tablet Take 1 tablet (60 mg total) by mouth daily.  90 tablet  3  . ketoconazole (NIZORAL) 2 % cream Apply topically daily. To affected area prn.  15 g  6  . loratadine (CLARITIN) 10 MG tablet Take 10 mg by mouth daily.        .  metoprolol (TOPROL XL) 50 MG 24 hr tablet Take 1 tablet (50 mg total) by mouth daily.  90 tablet  3  . mometasone (ELOCON) 0.1 % cream Apply topically daily.  45 g  6  . Multiple Vitamin (MULTIVITAMIN) tablet Take 1 tablet by mouth daily.        . nitroGLYCERIN (NITRODUR - DOSED IN MG/24 HR) 0.2 mg/hr Place 1 patch (0.2 mg total) onto the skin daily.  30 patch  6  . nitroGLYCERIN (NITROSTAT) 0.4 MG SL tablet Place 0.4 mg under the tongue every 5 (five) minutes as needed.        . Omega-3 Fatty Acids (FISH OIL DOUBLE STRENGTH) 1200 MG CAPS Take 1 capsule by mouth 2 (two) times daily.        . simvastatin (ZOCOR) 40 MG tablet Take 1 tablet (40 mg total) by mouth at bedtime.  90 tablet  3  . Specialty Vitamins Products (UDAMIN) TABS Take 1 tablet by mouth daily.        . VOLTAREN 1 % GEL APPLY TO THE AFFECTED AREA UP TO  FOUR TIMES DAILY  100 g  0  . zoster vaccine live, PF, (ZOSTAVAX) 16109 UNT/0.65ML injection Inject into the skin once. Dispense to patient to be admin in doctors office         History reviewed. No pertinent past surgical history.  Allergies  Allergen Reactions  . Digoxin     REACTION: bradycardia      Family History  Problem Relation Age of Onset  . Stroke Father   . Diabetes Father   . Hypertension Father   . Heart attack Father   . Hyperlipidemia Neg Hx   . Sudden death Neg Hx     BP 128/73  Pulse 85  Ht 5\' 10"  (1.778 m)  Wt 211 lb (95.709 kg)  BMI 30.28 kg/m2  Review of Systems See HPI above.    Objective:   Physical Exam Gen: NAD  R elbow: Bogginess right olecranon bursa. Minimal swelling.  No bruising, other deformity.  No erythema, minimal warmth. Minimal TTP olecranon bursa.  No other TTP about elbow. FROM without pain. 5/5 strength with flexion and extension. Collateral ligaments intact. NVI distally.    Assessment & Plan:  1. Right elbow olecranon bursitis - has done very well over past couple days with aggressive icing - nothing at this  point to aspirate or inject - advised him would recommend against this generally anyway unless having a lot of pain due to the swelling.  Body helix compression sleeve provided and felt comfortable.  Elevate, ice as much as possible.  F/u prn.

## 2012-04-18 ENCOUNTER — Other Ambulatory Visit: Payer: Self-pay | Admitting: Sports Medicine

## 2012-06-27 ENCOUNTER — Other Ambulatory Visit: Payer: Medicare Other

## 2012-06-27 DIAGNOSIS — I10 Essential (primary) hypertension: Secondary | ICD-10-CM

## 2012-06-27 NOTE — Progress Notes (Signed)
CMP AND FLP DONE TODAY Shrihan Putt 

## 2012-06-28 LAB — LIPID PANEL
HDL: 48 mg/dL (ref >39)
LDL Cholesterol: 73 mg/dL (ref 0–99)
Triglycerides: 69 mg/dL (ref <150)
VLDL: 14 mg/dL (ref 0–40)

## 2012-06-28 LAB — COMPREHENSIVE METABOLIC PANEL
Alkaline Phosphatase: 65 U/L (ref 39–117)
Glucose, Bld: 120 mg/dL — ABNORMAL HIGH (ref 70–99)
Sodium: 139 mEq/L (ref 135–145)
Total Bilirubin: 0.6 mg/dL (ref 0.3–1.2)
Total Protein: 7 g/dL (ref 6.0–8.3)

## 2012-06-29 ENCOUNTER — Encounter: Payer: Self-pay | Admitting: Family Medicine

## 2012-07-07 ENCOUNTER — Other Ambulatory Visit: Payer: Self-pay | Admitting: Family Medicine

## 2012-07-07 ENCOUNTER — Ambulatory Visit (INDEPENDENT_AMBULATORY_CARE_PROVIDER_SITE_OTHER): Payer: Medicare Other | Admitting: Family Medicine

## 2012-07-07 ENCOUNTER — Encounter: Payer: Self-pay | Admitting: Family Medicine

## 2012-07-07 VITALS — BP 118/68 | HR 62 | Ht 70.0 in | Wt 216.0 lb

## 2012-07-07 DIAGNOSIS — I10 Essential (primary) hypertension: Secondary | ICD-10-CM

## 2012-07-07 DIAGNOSIS — M545 Low back pain: Secondary | ICD-10-CM

## 2012-07-07 DIAGNOSIS — M109 Gout, unspecified: Secondary | ICD-10-CM

## 2012-07-07 DIAGNOSIS — I5020 Unspecified systolic (congestive) heart failure: Secondary | ICD-10-CM

## 2012-07-07 DIAGNOSIS — L821 Other seborrheic keratosis: Secondary | ICD-10-CM | POA: Insufficient documentation

## 2012-07-07 MED ORDER — METOPROLOL SUCCINATE ER 50 MG PO TB24: 50.0000 mg | ORAL_TABLET | Freq: Every day | ORAL | Status: DC

## 2012-07-07 MED ORDER — GABAPENTIN 300 MG PO CAPS: 300.0000 mg | ORAL_CAPSULE | Freq: Three times a day (TID) | ORAL | Status: DC

## 2012-07-07 NOTE — Assessment & Plan Note (Signed)
Well controlled. Labs reviewed from this month.  No changes.

## 2012-07-07 NOTE — Patient Instructions (Addendum)
It was a pleasure to see you today.   As we discussed, I would like you to increase the gabapentin to 300mg  capsules, twice daily. You may titrate up to 3 times daily (total 900mg  daily) as needed.   I would like to address your skin issues in a separate visit that is dedicated to this in January.

## 2012-07-07 NOTE — Assessment & Plan Note (Signed)
Lumbar back pain and R sided sacral pain, consider sacroiliac joint source, versus lumbosacral arthritis.  Negative SLR and intact LE strength and sensation makes disc herniation unlikely. May use the diclofenac daily for short-term use; also to increase gabapentin dose to 300mg  twice daily with possible escalation to 3 times daily.  Follow up if not improving as expected.

## 2012-07-07 NOTE — Progress Notes (Signed)
  Subjective:    Patient ID: Todd [name], male    DOB: 1930/06/17, 78 y.o.   MRN: [id number]  HPI Seen today in followup.  Overall, feeling well. Some recent flareup of low back pain and "sciatica" over R buttock, localized and does not radiate.  Currently is somewhat uncomfortable but not as bad as at other times.  Does not radiate down leg. Awakens at 530am with burning in the R buttock area.  Since April he has been taking gabapentin 100mg  capsules, upped to 300mg  nightly, for presumed neuropathic pain.  Has been evaluated by [facility] Dr Darrick Penna for this, his April note reviewed today.   No further gouty attacks since he started taking diclofenac 75mg  every other day.   Reviewed his recent labs done earlier this month; discussed results during the visit.   Planning river cruise at beginning of the new year.  Review of Systems Reviewed, see above.  No fevers/chills, no chest pain, no dyspnea.  Has noted some mild ankle edema.  No cough.  Has soft bowel movements with fiber supplement.  No difficulty passing urine.      Objective:   Physical Exam Well appearing, no apparent distress HEENT Neck supple, no cervical adenopathy.  Seborrheic keratoses along L forehead, L cheek.  Area of rough skin with mild erythema measuring 5mm diameter at crown of scalp. No JVD COR regular S1S2 with occasional skipped beat. PULM Clear bilaterally, no rales or wheezes ABD SOft, nontender, no organomegaly. No masses EXTS: 1+ pitting ankle edema bilaterally. Palpable dp pulses bilaterally.  MSK: SLR without limitation bilat.  No skin changes over lumbosacral region bilat.  No pain with palpation over greater trochanteric bursa on R.  Full passive ROM of hips bilaterally (flex/extension, int/ext rotation, add/abduction).       Assessment & Plan:

## 2012-07-07 NOTE — Assessment & Plan Note (Signed)
Has been well controlled with current regimen of diclofenac every other day. No changes.  Renal function checked earlier this month has been normal and unchanged from previous.

## 2012-07-14 ENCOUNTER — Other Ambulatory Visit: Payer: Self-pay | Admitting: *Deleted

## 2012-07-14 MED ORDER — FINASTERIDE 5 MG PO TABS: 5.0000 mg | ORAL_TABLET | Freq: Every day | ORAL | Status: DC

## 2012-07-21 ENCOUNTER — Other Ambulatory Visit: Payer: Self-pay | Admitting: Family Medicine

## 2012-07-24 ENCOUNTER — Telehealth: Payer: Self-pay | Admitting: Family Medicine

## 2012-07-24 NOTE — Telephone Encounter (Signed)
Todd [name] called to inform you that he does not want the Finasteride called in by our office.  He get that med from his urologist.  Also, he cancelled his appt for 1/21 due to him and wife will be traveling and will get back with you sometime for April.

## 2012-07-26 DIAGNOSIS — C189 Malignant neoplasm of colon, unspecified: Secondary | ICD-10-CM

## 2012-07-26 HISTORY — DX: Malignant neoplasm of colon, unspecified: C18.9

## 2012-08-11 ENCOUNTER — Other Ambulatory Visit: Payer: Self-pay | Admitting: Sports Medicine

## 2012-08-15 ENCOUNTER — Ambulatory Visit: Payer: Medicare Other | Admitting: Family Medicine

## 2012-09-04 ENCOUNTER — Inpatient Hospital Stay (HOSPITAL_BASED_OUTPATIENT_CLINIC_OR_DEPARTMENT_OTHER)
Admission: EM | Admit: 2012-09-04 | Discharge: 2012-09-14 | DRG: 329 | Disposition: A | Discharge status: Home Health | Payer: Medicare Other | Attending: Family Medicine | Admitting: Family Medicine

## 2012-09-04 ENCOUNTER — Emergency Department (HOSPITAL_BASED_OUTPATIENT_CLINIC_OR_DEPARTMENT_OTHER): Payer: Medicare Other

## 2012-09-04 ENCOUNTER — Encounter (HOSPITAL_BASED_OUTPATIENT_CLINIC_OR_DEPARTMENT_OTHER): Payer: Self-pay | Admitting: Emergency Medicine

## 2012-09-04 DIAGNOSIS — E669 Obesity, unspecified: Secondary | ICD-10-CM | POA: Diagnosis present

## 2012-09-04 DIAGNOSIS — K6389 Other specified diseases of intestine: Secondary | ICD-10-CM

## 2012-09-04 DIAGNOSIS — D500 Iron deficiency anemia secondary to blood loss (chronic): Secondary | ICD-10-CM | POA: Diagnosis not present

## 2012-09-04 DIAGNOSIS — R748 Abnormal levels of other serum enzymes: Secondary | ICD-10-CM

## 2012-09-04 DIAGNOSIS — R972 Elevated prostate specific antigen [PSA]: Secondary | ICD-10-CM

## 2012-09-04 DIAGNOSIS — C187 Malignant neoplasm of sigmoid colon: Principal | ICD-10-CM | POA: Diagnosis present

## 2012-09-04 DIAGNOSIS — Z66 Do not resuscitate: Secondary | ICD-10-CM | POA: Diagnosis present

## 2012-09-04 DIAGNOSIS — E876 Hypokalemia: Secondary | ICD-10-CM | POA: Diagnosis not present

## 2012-09-04 DIAGNOSIS — R05 Cough: Secondary | ICD-10-CM

## 2012-09-04 DIAGNOSIS — I10 Essential (primary) hypertension: Secondary | ICD-10-CM | POA: Diagnosis present

## 2012-09-04 DIAGNOSIS — I252 Old myocardial infarction: Secondary | ICD-10-CM

## 2012-09-04 DIAGNOSIS — I5042 Chronic combined systolic (congestive) and diastolic (congestive) heart failure: Secondary | ICD-10-CM | POA: Diagnosis present

## 2012-09-04 DIAGNOSIS — R7989 Other specified abnormal findings of blood chemistry: Secondary | ICD-10-CM | POA: Diagnosis present

## 2012-09-04 DIAGNOSIS — I509 Heart failure, unspecified: Secondary | ICD-10-CM | POA: Diagnosis present

## 2012-09-04 DIAGNOSIS — R109 Unspecified abdominal pain: Secondary | ICD-10-CM

## 2012-09-04 DIAGNOSIS — E119 Type 2 diabetes mellitus without complications: Secondary | ICD-10-CM | POA: Diagnosis present

## 2012-09-04 DIAGNOSIS — I4891 Unspecified atrial fibrillation: Secondary | ICD-10-CM | POA: Diagnosis not present

## 2012-09-04 DIAGNOSIS — R319 Hematuria, unspecified: Secondary | ICD-10-CM | POA: Diagnosis not present

## 2012-09-04 DIAGNOSIS — E785 Hyperlipidemia, unspecified: Secondary | ICD-10-CM | POA: Diagnosis present

## 2012-09-04 DIAGNOSIS — G589 Mononeuropathy, unspecified: Secondary | ICD-10-CM | POA: Diagnosis present

## 2012-09-04 DIAGNOSIS — R011 Cardiac murmur, unspecified: Secondary | ICD-10-CM | POA: Diagnosis present

## 2012-09-04 DIAGNOSIS — N400 Enlarged prostate without lower urinary tract symptoms: Secondary | ICD-10-CM | POA: Diagnosis present

## 2012-09-04 DIAGNOSIS — N390 Urinary tract infection, site not specified: Secondary | ICD-10-CM | POA: Diagnosis not present

## 2012-09-04 DIAGNOSIS — I5020 Unspecified systolic (congestive) heart failure: Secondary | ICD-10-CM

## 2012-09-04 DIAGNOSIS — Z7982 Long term (current) use of aspirin: Secondary | ICD-10-CM

## 2012-09-04 DIAGNOSIS — C787 Secondary malignant neoplasm of liver and intrahepatic bile duct: Secondary | ICD-10-CM | POA: Diagnosis present

## 2012-09-04 DIAGNOSIS — K5669 Other intestinal obstruction: Secondary | ICD-10-CM | POA: Diagnosis present

## 2012-09-04 DIAGNOSIS — Z79899 Other long term (current) drug therapy: Secondary | ICD-10-CM

## 2012-09-04 DIAGNOSIS — I2589 Other forms of chronic ischemic heart disease: Secondary | ICD-10-CM | POA: Diagnosis present

## 2012-09-04 DIAGNOSIS — Z87891 Personal history of nicotine dependence: Secondary | ICD-10-CM

## 2012-09-04 DIAGNOSIS — I2510 Atherosclerotic heart disease of native coronary artery without angina pectoris: Secondary | ICD-10-CM | POA: Diagnosis present

## 2012-09-04 DIAGNOSIS — J189 Pneumonia, unspecified organism: Secondary | ICD-10-CM | POA: Diagnosis not present

## 2012-09-04 DIAGNOSIS — I255 Ischemic cardiomyopathy: Secondary | ICD-10-CM | POA: Diagnosis present

## 2012-09-04 DIAGNOSIS — R112 Nausea with vomiting, unspecified: Secondary | ICD-10-CM

## 2012-09-04 DIAGNOSIS — M719 Bursopathy, unspecified: Secondary | ICD-10-CM

## 2012-09-04 DIAGNOSIS — M069 Rheumatoid arthritis, unspecified: Secondary | ICD-10-CM | POA: Diagnosis present

## 2012-09-04 DIAGNOSIS — M109 Gout, unspecified: Secondary | ICD-10-CM | POA: Diagnosis present

## 2012-09-04 DIAGNOSIS — D72829 Elevated white blood cell count, unspecified: Secondary | ICD-10-CM | POA: Diagnosis present

## 2012-09-04 HISTORY — DX: Rheumatoid arthritis, unspecified: M06.9

## 2012-09-04 HISTORY — DX: Polyneuropathy, unspecified: G62.9

## 2012-09-04 HISTORY — DX: Benign prostatic hyperplasia without lower urinary tract symptoms: N40.0

## 2012-09-04 HISTORY — DX: Heart failure, unspecified: I50.9

## 2012-09-04 HISTORY — DX: Essential (primary) hypertension: I10

## 2012-09-04 HISTORY — DX: Hyperlipidemia, unspecified: E78.5

## 2012-09-04 HISTORY — DX: Type 2 diabetes mellitus without complications: E11.9

## 2012-09-04 LAB — TROPONIN I: Troponin I: <0.3 ng/mL (ref <0.30)

## 2012-09-04 LAB — CBC WITH DIFFERENTIAL/PLATELET
Hemoglobin: 13.6 g/dL (ref 13.0–17.0)
Lymphocytes Relative: 11 % — ABNORMAL LOW (ref 12–46)
Lymphs Abs: 2.2 10*3/uL (ref 0.7–4.0)
MCV: 86.7 fL (ref 78.0–100.0)
Monocytes Relative: 2 % — ABNORMAL LOW (ref 3–12)
Neutrophils Relative %: 85 % — ABNORMAL HIGH (ref 43–77)
Platelets: 246 10*3/uL (ref 150–400)
RBC: 4.66 MIL/uL (ref 4.22–5.81)
WBC: 19 10*3/uL — ABNORMAL HIGH (ref 4.0–10.5)

## 2012-09-04 LAB — COMPREHENSIVE METABOLIC PANEL
AST: 39 U/L — ABNORMAL HIGH (ref 0–37)
Albumin: 4 g/dL (ref 3.5–5.2)
BUN: 31 mg/dL — ABNORMAL HIGH (ref 6–23)
Calcium: 10.4 mg/dL (ref 8.4–10.5)
Chloride: 99 mEq/L (ref 96–112)
Creatinine, Ser: 1.1 mg/dL (ref 0.50–1.35)
Total Bilirubin: 0.4 mg/dL (ref 0.3–1.2)

## 2012-09-04 LAB — LACTIC ACID, PLASMA: Lactic Acid, Venous: 3.3 mmol/L — ABNORMAL HIGH (ref 0.5–2.2)

## 2012-09-04 LAB — LIPASE, BLOOD: Lipase: 104 U/L — ABNORMAL HIGH (ref 11–59)

## 2012-09-04 MED ORDER — SODIUM CHLORIDE 0.9 % IV SOLN
Freq: Once | INTRAVENOUS | Status: AC
  Administered 2012-09-04: 23:00:00 via INTRAVENOUS

## 2012-09-04 MED ORDER — IOHEXOL 300 MG/ML  SOLN
100.0000 mL | Freq: Once | INTRAMUSCULAR | Status: AC | PRN
  Administered 2012-09-04: 100 mL via INTRAVENOUS

## 2012-09-04 MED ORDER — SODIUM CHLORIDE 0.9 % IV BOLUS (SEPSIS)
1000.0000 mL | Freq: Once | INTRAVENOUS | Status: AC
Start: 2012-09-04 — End: 2012-09-05
  Administered 2012-09-05: 1000 mL via INTRAVENOUS

## 2012-09-04 MED ORDER — IOHEXOL 300 MG/ML  SOLN
50.0000 mL | Freq: Once | INTRAMUSCULAR | Status: AC | PRN
  Administered 2012-09-04: 50 mL via ORAL

## 2012-09-04 MED ORDER — ONDANSETRON HCL 4 MG/2ML IJ SOLN
4.0000 mg | Freq: Once | INTRAMUSCULAR | Status: AC
  Administered 2012-09-04: 4 mg via INTRAVENOUS
  Filled 2012-09-04: qty 2

## 2012-09-04 MED ORDER — MORPHINE SULFATE 4 MG/ML IJ SOLN
4.0000 mg | Freq: Once | INTRAMUSCULAR | Status: AC
  Administered 2012-09-04: 4 mg via INTRAVENOUS
  Filled 2012-09-04: qty 1

## 2012-09-04 NOTE — ED Notes (Signed)
Pt with vomiting x 2 hours

## 2012-09-04 NOTE — ED Provider Notes (Signed)
History    This chart was scribed for Todd Booze, MD by Donne Anon, ED Scribe. This patient was seen in room MH02/MH02 and the patient's care was started at 2213.   CSN: [id number]  Arrival date & time 09/04/12  2201   First MD Initiated Contact with Patient 09/04/12 2213      Chief Complaint  Patient presents with  . Vomiting  . Chills     The history is provided by the patient. No language interpreter was used.   Todd [name] is a 79 y.o. male who presents to the Emergency Department complaining of sudden onset, intermittent, non-changing vomiting which began 2 hours PTA. He reports associated abdominal pain (pain rated 8/10) and cough. He denies feeling better once he passed gas. He reports associated fever, chills. He denies any other pain. He denies any kidney problems.  Pt reports he is not a smoker. He has never had abdominal surgery. He has a h/o MI in 1990 and has 30% capacity of his heart.  Past Medical History  Diagnosis Date  . Myocardial infarction   . Gout   . Hypertension   . Arthritis, rheumatoid     Past Surgical History  Procedure Laterality Date  . Cataract extraction      Family History  Problem Relation Age of Onset  . Stroke Father   . Diabetes Father   . Hypertension Father   . Heart attack Father   . Hyperlipidemia Neg Hx   . Sudden death Neg Hx     History  Substance Use Topics  . Smoking status: Former Games developer  . Smokeless tobacco: Never Used  . Alcohol Use: Yes     Comment: 1 drink a month      Review of Systems  Gastrointestinal: Positive for vomiting.  All other systems reviewed and are negative.    Allergies  Review of patient's allergies indicates no active allergies.  Home Medications   Current Outpatient Rx  Name  Route  Sig  Dispense  Refill  . psyllium (METAMUCIL) 58.6 % powder   Oral   Take 1 packet by mouth 2 (two) times daily.         Marland Kitchen aspirin 81 MG tablet   Oral   Take 81 mg by mouth daily.            . benazepril (LOTENSIN) 40 MG tablet   Oral   Take 1 tablet (40 mg total) by mouth daily.   90 tablet   3   . colchicine (COLCRYS) 0.6 MG tablet      Take 1 tablet by mouth every 4 hours until gouty attack subsides, or until diarrhea/GI upset.   30 tablet   2   . diclofenac (CATAFLAM) 50 MG tablet      Take 1 tablet as needed          . diclofenac (VOLTAREN) 75 MG EC tablet      TAKE 1 TABLET BY MOUTH TWICE DAILY   62 tablet   0   . doxazosin (CARDURA) 8 MG tablet   Oral   Take 8 mg by mouth daily.           . felodipine (PLENDIL) 5 MG 24 hr tablet      TAKE 1 TABLET BY MOUTH EVERY DAY   90 tablet   0   . finasteride (PROSCAR) 5 MG tablet   Oral   Take 1 tablet (5 mg total) by mouth daily.   30  tablet   3   . gabapentin (NEURONTIN) 300 MG capsule   Oral   Take 1 capsule (300 mg total) by mouth 3 (three) times daily.   270 capsule   3   . Glucosamine-Chondroit-Vit C-Mn (GLUCOSAMINE CHONDR 1500 COMPLX PO)   Oral   Take 1 capsule by mouth 3 (three) times daily.           . hydrochlorothiazide (HYDRODIURIL) 25 MG tablet   Oral   Take 1 tablet (25 mg total) by mouth daily.   90 tablet   3   . isosorbide mononitrate (IMDUR) 60 MG 24 hr tablet   Oral   Take 1 tablet (60 mg total) by mouth daily.   90 tablet   3   . loratadine (CLARITIN) 10 MG tablet   Oral   Take 10 mg by mouth daily.           . metoprolol succinate (TOPROL-XL) 50 MG 24 hr tablet      TAKE 1 TABLET BY MOUTH EVERY DAY   90 tablet   0   . mometasone (ELOCON) 0.1 % cream   Topical   Apply topically daily.   45 g   6   . Multiple Vitamin (MULTIVITAMIN) tablet   Oral   Take 1 tablet by mouth daily.           . nitroGLYCERIN (NITRODUR - DOSED IN MG/24 HR) 0.2 mg/hr   Transdermal   Place 1 patch (0.2 mg total) onto the skin daily.   30 patch   6   . nitroGLYCERIN (NITROSTAT) 0.4 MG SL tablet   Sublingual   Place 0.4 mg under the tongue every 5 (five) minutes as  needed.           . Omega-3 Fatty Acids (FISH OIL DOUBLE STRENGTH) 1200 MG CAPS   Oral   Take 1 capsule by mouth 2 (two) times daily.           . simvastatin (ZOCOR) 40 MG tablet   Oral   Take 1 tablet (40 mg total) by mouth at bedtime.   90 tablet   3   . Specialty Vitamins Products (UDAMIN) TABS   Oral   Take 1 tablet by mouth daily.           . Tamsulosin HCl (FLOMAX) 0.4 MG CAPS               . VOLTAREN 1 % GEL      APPLY TO THE AFFECTED AREA UP TO FOUR TIMES DAILY   100 g   0   . zoster vaccine live, PF, (ZOSTAVAX) 16109 UNT/0.65ML injection   Subcutaneous   Inject into the skin once. Dispense to patient to be admin in doctors office            BP 93/43  Pulse 84  Ht 5\' 10"  (1.778 m)  Wt 214 lb (97.07 kg)  BMI 30.71 kg/m2  SpO2 95%  Physical Exam  Nursing note and vitals reviewed. Constitutional: He is oriented to person, place, and time. He appears well-developed and well-nourished. No distress.  HENT:  Head: Normocephalic and atraumatic.  Eyes: EOM are normal.  Neck: Neck supple. No tracheal deviation present.  Cardiovascular: Normal rate.   Pulmonary/Chest: Effort normal. No respiratory distress.  Abdominal: There is no rebound and no guarding.  Moderate tenderness diffusely. Bowel sounds decreased.  Musculoskeletal: Normal range of motion.  2+ pitting edema.  Neurological: He is alert and  oriented to person, place, and time.  Skin: Skin is warm and dry.  Psychiatric: He has a normal mood and affect. His behavior is normal.    ED Course  Procedures (including critical care time) DIAGNOSTIC STUDIES: Oxygen Saturation is 95% on room air, adequate by my interpretation.    COORDINATION OF CARE: 10:19 PM Discussed treatment plan which includes CT scan, EKG and medication with pt at bedside and pt agreed to plan.   Results for orders placed during the hospital encounter of 09/04/12  COMPREHENSIVE METABOLIC PANEL      Result Value Range    Sodium 139  135 - 145 mEq/L   Potassium 3.8  3.5 - 5.1 mEq/L   Chloride 99  96 - 112 mEq/L   CO2 23  19 - 32 mEq/L   Glucose, Bld 219 (*) 70 - 99 mg/dL   BUN 31 (*) 6 - 23 mg/dL   Creatinine, Ser 2.13  0.50 - 1.35 mg/dL   Calcium 08.6  8.4 - 57.8 mg/dL   Total Protein 8.0  6.0 - 8.3 g/dL   Albumin 4.0  3.5 - 5.2 g/dL   AST 39 (*) 0 - 37 U/L   ALT 28  0 - 53 U/L   Alkaline Phosphatase 173 (*) 39 - 117 U/L   Total Bilirubin 0.4  0.3 - 1.2 mg/dL   GFR calc non Af Amer 61 (*) >90 mL/min   GFR calc Af Amer 71 (*) >90 mL/min  CBC WITH DIFFERENTIAL      Result Value Range   WBC 19.0 (*) 4.0 - 10.5 K/uL   RBC 4.66  4.22 - 5.81 MIL/uL   Hemoglobin 13.6  13.0 - 17.0 g/dL   HCT 46.9  62.9 - 52.8 %   MCV 86.7  78.0 - 100.0 fL   MCH 29.2  26.0 - 34.0 pg   MCHC 33.7  30.0 - 36.0 g/dL   RDW 41.3  24.4 - 01.0 %   Platelets 246  150 - 400 K/uL   Neutrophils Relative 85 (*) 43 - 77 %   Neutro Abs 16.2 (*) 1.7 - 7.7 K/uL   Lymphocytes Relative 11 (*) 12 - 46 %   Lymphs Abs 2.2  0.7 - 4.0 K/uL   Monocytes Relative 2 (*) 3 - 12 %   Monocytes Absolute 0.4  0.1 - 1.0 K/uL   Eosinophils Relative 1  0 - 5 %   Eosinophils Absolute 0.2  0.0 - 0.7 K/uL   Basophils Relative 0  0 - 1 %   Basophils Absolute 0.0  0.0 - 0.1 K/uL  LIPASE, BLOOD      Result Value Range   Lipase 104 (*) 11 - 59 U/L  LACTIC ACID, PLASMA      Result Value Range   Lactic Acid, Venous 3.3 (*) 0.5 - 2.2 mmol/L  TROPONIN I      Result Value Range   Troponin I <0.30  <0.30 ng/mL   Ct Abdomen Pelvis W Contrast  09/05/2012  *RADIOLOGY REPORT*  Clinical Data: Nausea, vomiting, abdominal pain and cough.  Chills.  CT ABDOMEN AND PELVIS WITH CONTRAST  Technique:  Multidetector CT imaging of the abdomen and pelvis was performed following the standard protocol during bolus administration of intravenous contrast.  Contrast:  100 mL of Omnipaque 300 IV contrast  Comparison: Lumbar spine radiographs performed 03/25/2009  Findings: Left  basilar airspace opacity may reflect atelectasis or possibly pneumonia.  Diffuse coronary artery calcifications are noted.  Multiple mildly heterogeneous slightly ill-defined masses are seen throughout the right hepatic lobe, the largest of which measures 11.5 cm.  This is most compatible with metastatic disease.  The spleen is unremarkable in appearance.  The pancreas is grossly unremarkable.  Note is made of a somewhat low attenuation node adjacent to the proximal pancreatic body, measuring 2.4 x 1.5 cm, likely reflecting metastatic disease.  There is a 2.6 cm right adrenal mass; this may also reflect metastatic disease.  The left adrenal gland is unremarkable in appearance.  Left renal cysts are noted, measuring up to 10.3 cm in size, with minimal peripheral calcification.  No definite suspicious masses are seen.  Small right renal cysts are also seen.  Mild nonspecific perinephric stranding is noted bilaterally.  There is no evidence of hydronephrosis.  No renal or ureteral stones are seen.  The small bowel is unremarkable in appearance.  The stomach is within normal limits.  No acute vascular abnormalities are seen. Diffuse calcification is noted along the abdominal aorta and its branches.  The suspected primary malignancy is noted at the mid sigmoid colon, measuring approximately 5.2 x 4.7 x 4.1 cm, with scattered small adjacent nodules reflecting local spread of disease.  There is no evidence of obstruction; no perforation is seen.  Trace associated free fluid is noted.  There is mild diffuse wall thickening noted involving the proximal sigmoid colon, possibly reflecting chronic inflammation due to the more distal mass.  The remainder of the colon is grossly unremarkable.  The appendix is not definitely seen.  Scattered small pelvic sidewall nodes may remain within normal limits; there is diffuse haziness within the retroperitoneum, without definite retroperitoneal lymphadenopathy.  The bladder is mildly  distended and grossly unremarkable; a very large prostate is noted, measuring approximately 7.7 x 6.4 x 7.3 cm.  Minimal associated calcification is seen.  No inguinal lymphadenopathy is seen.  No acute osseous abnormalities are identified.  Multilevel vacuum phenomenon and disc space narrowing are noted along the lumbar spine, with underlying facet disease.  IMPRESSION:  1.  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  No evidence for obstruction; no perforation seen.  Trace associated free fluid noted. 2.  Mild diffuse wall thickening involving the proximal sigmoid colon may reflect chronic inflammation due to the more distal mass; the remaining colon is unremarkable. 3.  Multiple mildly heterogeneous slightly ill-defined masses throughout the right hepatic lobe, measuring up to 11.5 cm, compatible with metastatic disease. 4.  2.4 x 1.5 cm peripancreatic node demonstrates mild low attenuation, likely reflecting metastatic disease. 5.  2.6 cm adrenal mass may reflect metastatic disease, though it is nonspecific in appearance. 6.  Focal left basilar airspace opacification may reflect atelectasis or possibly pneumonia. 7.  Diffuse coronary artery calcifications seen. 8.  Bilateral renal cysts, particularly prominent on the left side. 9.  Diffuse calcification along the abdominal aorta and its branches. 10.  Markedly enlarged prostate noted, measuring 7.7 cm in size. 11.  Diffuse degenerative change noted along the lumbar spine.   Original Report Authenticated By: Tonia Ghent, M.D.       Date: 09/04/2012  Rate: 82  Rhythm: normal sinus rhythm  QRS Axis: normal  Intervals: normal  ST/T Wave abnormalities: normal  Conduction Disutrbances:none  Narrative Interpretation: Old anteroseptal myocardial infarction. When compared with ECG of 10/08/2004, no significant changes are seen.  Old EKG  Reviewed: unchanged  1. Abdominal pain   2. Nausea vomiting and diarrhea   3. Elevated lipase   4. Elevated liver enzymes   5. Increased lactic acid level    CRITICAL CARE Performed by: GNFAO,ZHYQM   Total critical care time: 50 minutes  Critical care time was exclusive of separately billable procedures and treating other patients.  Critical care was necessary to treat or prevent imminent or life-threatening deterioration.  Critical care was time spent personally by me on the following activities: development of treatment plan with patient and/or surrogate as well as nursing, discussions with consultants, evaluation of patient's response to treatment, examination of patient, obtaining history from patient or surrogate, ordering and performing treatments and interventions, ordering and review of laboratory studies, ordering and review of radiographic studies, pulse oximetry and re-evaluation of patient's condition.   MDM  Abdominal pain with vomiting. Presentation seems most consistent with a viral gastritis. However, diffuse abdominal pain and tenderness is worrisome for other process. Laboratory workup will be done including cardiac markers and lactic acid level and CT scan will be obtained. He'll be given IV fluids, IV ondansetron, IV morphine.  He feels much better after the above noted medication. He also had a large liquid bowel movement. Specimen is been sent for Clostridium difficile PCR. Laboratory workup shows WBC of 19,000, elevated lipase and mildly elevated lactic acid. He is given an IV fluid bolus.  CT scan has come out showing evidence of colitis which is probably the cause of his pain. Also noted are numerous lesions that are probably metastatic-primarily in the liver. Radiologist has reported possible airspace disease that might represent pneumonia, but that he does not show clinical signs of pneumonia and I feel this more likely represents atelectasis. Images were  viewed by me and discussed with radiologist.  Case is discussed with Dr. Darnelle Catalan of [facility]. Patient started on ciprofloxacin and metronidazole for empiric treatment of colitis and he will be transferred to the [facility].  I personally performed the services described in this documentation, which was scribed in my presence. The recorded information has been reviewed and is accurate.           Todd Booze, MD 09/05/12 [phone]

## 2012-09-04 NOTE — ED Notes (Signed)
Rerport received and care assumed at this time.

## 2012-09-05 ENCOUNTER — Ambulatory Visit: Payer: Medicare Other | Admitting: Family Medicine

## 2012-09-05 ENCOUNTER — Encounter (HOSPITAL_COMMUNITY)
Admission: EM | Disposition: A | Discharge status: Home Health | Payer: Self-pay | Source: Home / Self Care | Attending: Family Medicine

## 2012-09-05 ENCOUNTER — Encounter (HOSPITAL_COMMUNITY): Payer: Self-pay | Admitting: Internal Medicine

## 2012-09-05 DIAGNOSIS — N400 Enlarged prostate without lower urinary tract symptoms: Secondary | ICD-10-CM | POA: Diagnosis present

## 2012-09-05 DIAGNOSIS — R748 Abnormal levels of other serum enzymes: Secondary | ICD-10-CM | POA: Diagnosis present

## 2012-09-05 DIAGNOSIS — D490 Neoplasm of unspecified behavior of digestive system: Secondary | ICD-10-CM

## 2012-09-05 DIAGNOSIS — R7989 Other specified abnormal findings of blood chemistry: Secondary | ICD-10-CM | POA: Diagnosis present

## 2012-09-05 DIAGNOSIS — K56609 Unspecified intestinal obstruction, unspecified as to partial versus complete obstruction: Secondary | ICD-10-CM

## 2012-09-05 DIAGNOSIS — D72829 Elevated white blood cell count, unspecified: Secondary | ICD-10-CM | POA: Diagnosis present

## 2012-09-05 DIAGNOSIS — R109 Unspecified abdominal pain: Secondary | ICD-10-CM

## 2012-09-05 DIAGNOSIS — R112 Nausea with vomiting, unspecified: Secondary | ICD-10-CM | POA: Diagnosis present

## 2012-09-05 DIAGNOSIS — K6389 Other specified diseases of intestine: Secondary | ICD-10-CM

## 2012-09-05 HISTORY — PX: FLEXIBLE SIGMOIDOSCOPY: SHX5431

## 2012-09-05 LAB — GLUCOSE, CAPILLARY
Glucose-Capillary: 160 mg/dL — ABNORMAL HIGH (ref 70–99)
Glucose-Capillary: 163 mg/dL — ABNORMAL HIGH (ref 70–99)

## 2012-09-05 LAB — URINALYSIS, ROUTINE W REFLEX MICROSCOPIC
Glucose, UA: NEGATIVE mg/dL
Leukocytes, UA: NEGATIVE
Nitrite: POSITIVE — AB
Specific Gravity, Urine: 1.01 (ref 1.005–1.030)
pH: 5 (ref 5.0–8.0)

## 2012-09-05 LAB — URINE MICROSCOPIC-ADD ON

## 2012-09-05 LAB — HEMOGLOBIN A1C: Mean Plasma Glucose: 131 mg/dL — ABNORMAL HIGH (ref <117)

## 2012-09-05 SURGERY — SIGMOIDOSCOPY, FLEXIBLE
Anesthesia: Moderate Sedation

## 2012-09-05 MED ORDER — SODIUM CHLORIDE 0.9 % IV SOLN
INTRAVENOUS | Status: DC
  Administered 2012-09-05 – 2012-09-06 (×4): via INTRAVENOUS
  Administered 2012-09-06: 10 mL/h via INTRAVENOUS

## 2012-09-05 MED ORDER — SODIUM CHLORIDE 0.9 % IV SOLN: INTRAVENOUS | Status: DC

## 2012-09-05 MED ORDER — ISOSORBIDE MONONITRATE ER 60 MG PO TB24
60.0000 mg | ORAL_TABLET | Freq: Every day | ORAL | Status: DC
  Administered 2012-09-05 – 2012-09-07 (×3): 60 mg via ORAL
  Filled 2012-09-05 (×3): qty 1

## 2012-09-05 MED ORDER — MORPHINE SULFATE 4 MG/ML IJ SOLN
4.0000 mg | INTRAMUSCULAR | Status: DC | PRN
  Administered 2012-09-05 – 2012-09-06 (×3): 4 mg via INTRAVENOUS
  Filled 2012-09-05 (×3): qty 1

## 2012-09-05 MED ORDER — SODIUM CHLORIDE 0.9 % IV SOLN
INTRAVENOUS | Status: DC
  Administered 2012-09-05: 10 mL/h via INTRAVENOUS

## 2012-09-05 MED ORDER — FENTANYL CITRATE 0.05 MG/ML IJ SOLN
INTRAMUSCULAR | Status: AC
  Filled 2012-09-05: qty 2

## 2012-09-05 MED ORDER — CIPROFLOXACIN IN D5W 400 MG/200ML IV SOLN
400.0000 mg | Freq: Once | INTRAVENOUS | Status: AC
  Administered 2012-09-05: 400 mg via INTRAVENOUS
  Filled 2012-09-05: qty 200

## 2012-09-05 MED ORDER — MIDAZOLAM HCL 5 MG/ML IJ SOLN
INTRAMUSCULAR | Status: AC
  Filled 2012-09-05: qty 2

## 2012-09-05 MED ORDER — PSYLLIUM 95 % PO PACK
1.0000 | PACK | Freq: Two times a day (BID) | ORAL | Status: DC
  Administered 2012-09-05 – 2012-09-09 (×3): 1 via ORAL
  Filled 2012-09-05 (×12): qty 1

## 2012-09-05 MED ORDER — METRONIDAZOLE IN NACL 5-0.79 MG/ML-% IV SOLN
500.0000 mg | Freq: Three times a day (TID) | INTRAVENOUS | Status: DC
  Administered 2012-09-05 – 2012-09-08 (×9): 500 mg via INTRAVENOUS
  Filled 2012-09-05 (×12): qty 100

## 2012-09-05 MED ORDER — METRONIDAZOLE IN NACL 5-0.79 MG/ML-% IV SOLN
500.0000 mg | Freq: Once | INTRAVENOUS | Status: AC
  Administered 2012-09-05: 500 mg via INTRAVENOUS
  Filled 2012-09-05: qty 100

## 2012-09-05 MED ORDER — NITROGLYCERIN 0.4 MG SL SUBL
0.4000 mg | SUBLINGUAL_TABLET | SUBLINGUAL | Status: DC | PRN
Start: 2012-09-05 — End: 2012-09-14

## 2012-09-05 MED ORDER — TRAMADOL HCL 50 MG PO TABS
50.0000 mg | ORAL_TABLET | Freq: Four times a day (QID) | ORAL | Status: DC | PRN
  Administered 2012-09-06: 50 mg via ORAL
  Filled 2012-09-05 (×2): qty 1

## 2012-09-05 MED ORDER — FENTANYL CITRATE 0.05 MG/ML IJ SOLN
INTRAMUSCULAR | Status: DC | PRN
  Administered 2012-09-05: 15 ug via INTRAVENOUS
  Administered 2012-09-05: 25 ug via INTRAVENOUS

## 2012-09-05 MED ORDER — INSULIN ASPART 100 UNIT/ML ~~LOC~~ SOLN: 0.0000 [IU] | SUBCUTANEOUS | Status: DC

## 2012-09-05 MED ORDER — LORATADINE 10 MG PO TABS
10.0000 mg | ORAL_TABLET | Freq: Every day | ORAL | Status: DC | PRN
  Filled 2012-09-05: qty 1

## 2012-09-05 MED ORDER — MIDAZOLAM HCL 10 MG/2ML IJ SOLN
INTRAMUSCULAR | Status: DC | PRN
  Administered 2012-09-05 (×2): 2 mg via INTRAVENOUS

## 2012-09-05 MED ORDER — DOXAZOSIN MESYLATE 8 MG PO TABS
8.0000 mg | ORAL_TABLET | Freq: Every day | ORAL | Status: DC
  Administered 2012-09-05 – 2012-09-14 (×10): 8 mg via ORAL
  Filled 2012-09-05 (×10): qty 1

## 2012-09-05 MED ORDER — ONDANSETRON HCL 4 MG/2ML IJ SOLN
4.0000 mg | Freq: Three times a day (TID) | INTRAMUSCULAR | Status: AC | PRN
  Filled 2012-09-05: qty 2

## 2012-09-05 MED ORDER — CIPROFLOXACIN IN D5W 400 MG/200ML IV SOLN
400.0000 mg | Freq: Two times a day (BID) | INTRAVENOUS | Status: DC
  Administered 2012-09-05 – 2012-09-06 (×3): 400 mg via INTRAVENOUS
  Filled 2012-09-05 (×4): qty 200

## 2012-09-05 MED ORDER — SODIUM CHLORIDE 0.9 % IJ SOLN
3.0000 mL | Freq: Two times a day (BID) | INTRAMUSCULAR | Status: DC
  Administered 2012-09-05 – 2012-09-12 (×10): 3 mL via INTRAVENOUS

## 2012-09-05 MED ORDER — GABAPENTIN 300 MG PO CAPS
300.0000 mg | ORAL_CAPSULE | Freq: Three times a day (TID) | ORAL | Status: DC
  Administered 2012-09-05 – 2012-09-07 (×6): 300 mg via ORAL
  Filled 2012-09-05 (×8): qty 1

## 2012-09-05 NOTE — Progress Notes (Signed)
[facility]MTS Attending Daily Note: Denny Levy MD [phone] pager office [phone] I  have seen and examined this patient, reviewed their chart. I have discussed this patient with the resident. I agree with the resident's findings, assessment and care plan. I appreciate care by consult services,i.e. Surgery, GU, oncology.

## 2012-09-05 NOTE — Progress Notes (Signed)
FMTS Attending/Primary Physician note I saw Mr Goeller today; he reports he is feeling much better from the standpoint of his abd pain and nausea.  Has passed some frank blood per rectum this morning.  For flex sig this afternoon.  He reaffirms his wishes to be DNR, has living will.  For MOST form to be completed prior to discharge.  Wife and daughter Alphonsus Sias) at bedside during our conversation. Paula Compton, MD

## 2012-09-05 NOTE — Progress Notes (Signed)
Pt had watery bloody stool prior to receiving tap water enema.  Dr. Madilyn Fireman notified and order given to hold tap water enema for now and place order for cbc in AM.  Will carry out MD orders and continue to monitor.

## 2012-09-05 NOTE — Consult Note (Signed)
[facility] Consult Note  Referring Provider: No ref. provider found Primary Care Physician:  [name], MD Primary Gastroenterologist:  Dr.  Antony Contras Complaint: Nausea vomiting and diarrhea HPI: Todd [name] is an 79 y.o. white male  presented with the abrupt onset of nausea vomiting and diarrhea of 1 day duration. He had an abdominal CT scan which surprisingly showed multiple right sided liver masses consistent with metastases as well as and adrenal probable metastases and a suspected primary sigmoid colon mass. The patient denies any previous symptoms of weight loss night sweats abdominal pain nausea vomiting change in stool caliber or rectal bleeding. He has never had a colonoscopy.  Past Medical History  Diagnosis Date  . Myocardial infarction   . Gout   . Hypertension   . Arthritis, rheumatoid   . Diabetes mellitus without complication   . CHF (congestive heart failure)   . BPH (benign prostatic hyperplasia)   . Neuropathy   . Hyperlipidemia     Past Surgical History  Procedure Laterality Date  . Cataract extraction      Medications Prior to Admission  Medication Sig Dispense Refill  . psyllium (METAMUCIL) 58.6 % powder Take 1 packet by mouth 2 (two) times daily.      Marland Kitchen aspirin 81 MG tablet Take 81 mg by mouth daily.        . benazepril (LOTENSIN) 40 MG tablet Take 1 tablet (40 mg total) by mouth daily.  90 tablet  3  . colchicine (COLCRYS) 0.6 MG tablet Take 1 tablet by mouth every 4 hours until gouty attack subsides, or until diarrhea/GI upset.  30 tablet  2  . diclofenac (CATAFLAM) 50 MG tablet Take 1 tablet as needed       . diclofenac (VOLTAREN) 75 MG EC tablet TAKE 1 TABLET BY MOUTH TWICE DAILY  62 tablet  0  . doxazosin (CARDURA) 8 MG tablet Take 8 mg by mouth daily.        . felodipine (PLENDIL) 5 MG 24 hr tablet TAKE 1 TABLET BY MOUTH EVERY DAY  90 tablet  0  . finasteride (PROSCAR) 5 MG tablet Take 1 tablet (5 mg total) by mouth daily.  30 tablet  3   . gabapentin (NEURONTIN) 300 MG capsule Take 1 capsule (300 mg total) by mouth 3 (three) times daily.  270 capsule  3  . Glucosamine-Chondroit-Vit C-Mn (GLUCOSAMINE CHONDR 1500 COMPLX PO) Take 1 capsule by mouth 3 (three) times daily.        . hydrochlorothiazide (HYDRODIURIL) 25 MG tablet Take 1 tablet (25 mg total) by mouth daily.  90 tablet  3  . isosorbide mononitrate (IMDUR) 60 MG 24 hr tablet Take 1 tablet (60 mg total) by mouth daily.  90 tablet  3  . loratadine (CLARITIN) 10 MG tablet Take 10 mg by mouth daily.        . metoprolol succinate (TOPROL-XL) 50 MG 24 hr tablet TAKE 1 TABLET BY MOUTH EVERY DAY  90 tablet  0  . mometasone (ELOCON) 0.1 % cream Apply topically daily.  45 g  6  . Multiple Vitamin (MULTIVITAMIN) tablet Take 1 tablet by mouth daily.        . nitroGLYCERIN (NITRODUR - DOSED IN MG/24 HR) 0.2 mg/hr Place 1 patch (0.2 mg total) onto the skin daily.  30 patch  6  . nitroGLYCERIN (NITROSTAT) 0.4 MG SL tablet Place 0.4 mg under the tongue every 5 (five) minutes as needed.        Marland Kitchen  Omega-3 Fatty Acids (FISH OIL DOUBLE STRENGTH) 1200 MG CAPS Take 1 capsule by mouth 2 (two) times daily.        . simvastatin (ZOCOR) 40 MG tablet Take 1 tablet (40 mg total) by mouth at bedtime.  90 tablet  3  . Specialty Vitamins Products (UDAMIN) TABS Take 1 tablet by mouth daily.        . Tamsulosin HCl (FLOMAX) 0.4 MG CAPS       . VOLTAREN 1 % GEL APPLY TO THE AFFECTED AREA UP TO FOUR TIMES DAILY  100 g  0  . zoster vaccine live, PF, (ZOSTAVAX) 81191 UNT/0.65ML injection Inject into the skin once. Dispense to patient to be admin in doctors office         Allergies: No Active Allergies  Family History  Problem Relation Age of Onset  . Stroke Father   . Diabetes Father   . Hypertension Father   . Heart attack Father   . Hyperlipidemia Neg Hx   . Sudden death Neg Hx     Social History:  reports that he has quit smoking. He has never used smokeless tobacco. He reports that  drinks  alcohol. He reports that he does not use illicit drugs.  Review of Systems: negative except as above   Blood pressure 127/59, pulse 69, temperature 97.6 F (36.4 C), temperature source Oral, resp. rate 20, height 5\' 10"  (1.778 m), weight 99.791 kg (220 lb), SpO2 98.00%. Head: Normocephalic, without obvious abnormality, atraumatic Neck: no adenopathy, no carotid bruit, no JVD, supple, symmetrical, trachea midline and thyroid not enlarged, symmetric, no tenderness/mass/nodules Resp: clear to auscultation bilaterally Cardio: regular rate and rhythm, S1, S2 normal, no murmur, click, rub or gallop GI: Abdomen moderately tender particularly in the left lower quadrant without rebound Extremities: extremities normal, atraumatic, no cyanosis or edema  Results for orders placed during the hospital encounter of 09/04/12 (from the past 48 hour(s))  COMPREHENSIVE METABOLIC PANEL     Status: Abnormal   Collection Time    09/04/12 10:15 PM      Result Value Range   Sodium 139  135 - 145 mEq/L   Potassium 3.8  3.5 - 5.1 mEq/L   Chloride 99  96 - 112 mEq/L   CO2 23  19 - 32 mEq/L   Glucose, Bld 219 (*) 70 - 99 mg/dL   BUN 31 (*) 6 - 23 mg/dL   Creatinine, Ser 4.78  0.50 - 1.35 mg/dL   Calcium 29.5  8.4 - 62.1 mg/dL   Total Protein 8.0  6.0 - 8.3 g/dL   Albumin 4.0  3.5 - 5.2 g/dL   AST 39 (*) 0 - 37 U/L   ALT 28  0 - 53 U/L   Alkaline Phosphatase 173 (*) 39 - 117 U/L   Total Bilirubin 0.4  0.3 - 1.2 mg/dL   GFR calc non Af Amer 61 (*) >90 mL/min   GFR calc Af Amer 71 (*) >90 mL/min   Comment:            The eGFR has been calculated     using the CKD EPI equation.     This calculation has not been     validated in all clinical     situations.     eGFR's persistently     <90 mL/min signify     possible Chronic Kidney Disease.  CBC WITH DIFFERENTIAL     Status: Abnormal   Collection Time    09/04/12  10:15 PM      Result Value Range   WBC 19.0 (*) 4.0 - 10.5 K/uL   RBC 4.66  4.22 - 5.81  MIL/uL   Hemoglobin 13.6  13.0 - 17.0 g/dL   HCT 14.7  82.9 - 56.2 %   MCV 86.7  78.0 - 100.0 fL   MCH 29.2  26.0 - 34.0 pg   MCHC 33.7  30.0 - 36.0 g/dL   RDW 13.0  86.5 - 78.4 %   Platelets 246  150 - 400 K/uL   Neutrophils Relative 85 (*) 43 - 77 %   Neutro Abs 16.2 (*) 1.7 - 7.7 K/uL   Lymphocytes Relative 11 (*) 12 - 46 %   Lymphs Abs 2.2  0.7 - 4.0 K/uL   Monocytes Relative 2 (*) 3 - 12 %   Monocytes Absolute 0.4  0.1 - 1.0 K/uL   Eosinophils Relative 1  0 - 5 %   Eosinophils Absolute 0.2  0.0 - 0.7 K/uL   Basophils Relative 0  0 - 1 %   Basophils Absolute 0.0  0.0 - 0.1 K/uL  LIPASE, BLOOD     Status: Abnormal   Collection Time    09/04/12 10:15 PM      Result Value Range   Lipase 104 (*) 11 - 59 U/L  LACTIC ACID, PLASMA     Status: Abnormal   Collection Time    09/04/12 10:15 PM      Result Value Range   Lactic Acid, Venous 3.3 (*) 0.5 - 2.2 mmol/L  TROPONIN I     Status: None   Collection Time    09/04/12 10:15 PM      Result Value Range   Troponin I <0.30  <0.30 ng/mL   Comment:            Due to the release kinetics of cTnI,     a negative result within the first hours     of the onset of symptoms does not rule out     myocardial infarction with certainty.     If myocardial infarction is still suspected,     repeat the test at appropriate intervals.  GLUCOSE, CAPILLARY     Status: Abnormal   Collection Time    09/05/12  5:45 AM      Result Value Range   Glucose-Capillary 160 (*) 70 - 99 mg/dL  GLUCOSE, CAPILLARY     Status: Abnormal   Collection Time    09/05/12  8:26 AM      Result Value Range   Glucose-Capillary 162 (*) 70 - 99 mg/dL   Comment 1 Notify RN    URINALYSIS, ROUTINE W REFLEX MICROSCOPIC     Status: Abnormal   Collection Time    09/05/12  8:37 AM      Result Value Range   Color, Urine AMBER (*) YELLOW   Comment: BIOCHEMICALS MAY BE AFFECTED BY COLOR   APPearance CLEAR  CLEAR   Specific Gravity, Urine 1.010  1.005 - 1.030   pH 5.0  5.0  - 8.0   Glucose, UA NEGATIVE  NEGATIVE mg/dL   Hgb urine dipstick NEGATIVE  NEGATIVE   Bilirubin Urine NEGATIVE  NEGATIVE   Ketones, ur 15 (*) NEGATIVE mg/dL   Protein, ur NEGATIVE  NEGATIVE mg/dL   Urobilinogen, UA 0.2  0.0 - 1.0 mg/dL   Nitrite POSITIVE (*) NEGATIVE   Leukocytes, UA NEGATIVE  NEGATIVE  URINE MICROSCOPIC-ADD ON     Status:  Abnormal   Collection Time    09/05/12  8:37 AM      Result Value Range   WBC, UA 0-2  <3 WBC/hpf   RBC / HPF 0-2  <3 RBC/hpf   Bacteria, UA RARE  RARE   Casts HYALINE CASTS (*) NEGATIVE   Ct Abdomen Pelvis W Contrast  09/05/2012  *RADIOLOGY REPORT*  Clinical Data: Nausea, vomiting, abdominal pain and cough.  Chills.  CT ABDOMEN AND PELVIS WITH CONTRAST  Technique:  Multidetector CT imaging of the abdomen and pelvis was performed following the standard protocol during bolus administration of intravenous contrast.  Contrast:  100 mL of Omnipaque 300 IV contrast  Comparison: Lumbar spine radiographs performed 03/25/2009  Findings: Left basilar airspace opacity may reflect atelectasis or possibly pneumonia.  Diffuse coronary artery calcifications are noted.  Multiple mildly heterogeneous slightly ill-defined masses are seen throughout the right hepatic lobe, the largest of which measures 11.5 cm.  This is most compatible with metastatic disease.  The spleen is unremarkable in appearance.  The pancreas is grossly unremarkable.  Note is made of a somewhat low attenuation node adjacent to the proximal pancreatic body, measuring 2.4 x 1.5 cm, likely reflecting metastatic disease.  There is a 2.6 cm right adrenal mass; this may also reflect metastatic disease.  The left adrenal gland is unremarkable in appearance.  Left renal cysts are noted, measuring up to 10.3 cm in size, with minimal peripheral calcification.  No definite suspicious masses are seen.  Small right renal cysts are also seen.  Mild nonspecific perinephric stranding is noted bilaterally.  There is no  evidence of hydronephrosis.  No renal or ureteral stones are seen.  The small bowel is unremarkable in appearance.  The stomach is within normal limits.  No acute vascular abnormalities are seen. Diffuse calcification is noted along the abdominal aorta and its branches.  The suspected primary malignancy is noted at the mid sigmoid colon, measuring approximately 5.2 x 4.7 x 4.1 cm, with scattered small adjacent nodules reflecting local spread of disease.  There is no evidence of obstruction; no perforation is seen.  Trace associated free fluid is noted.  There is mild diffuse wall thickening noted involving the proximal sigmoid colon, possibly reflecting chronic inflammation due to the more distal mass.  The remainder of the colon is grossly unremarkable.  The appendix is not definitely seen.  Scattered small pelvic sidewall nodes may remain within normal limits; there is diffuse haziness within the retroperitoneum, without definite retroperitoneal lymphadenopathy.  The bladder is mildly distended and grossly unremarkable; a very large prostate is noted, measuring approximately 7.7 x 6.4 x 7.3 cm.  Minimal associated calcification is seen.  No inguinal lymphadenopathy is seen.  No acute osseous abnormalities are identified.  Multilevel vacuum phenomenon and disc space narrowing are noted along the lumbar spine, with underlying facet disease.  IMPRESSION:  1.  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  No evidence for obstruction; no perforation seen.  Trace associated free fluid noted. 2.  Mild diffuse wall thickening involving the proximal sigmoid colon may reflect chronic inflammation due to the more distal mass; the remaining colon is unremarkable. 3.  Multiple mildly heterogeneous slightly ill-defined masses throughout the right hepatic lobe, measuring up to 11.5 cm, compatible with  metastatic disease. 4.  2.4 x 1.5 cm peripancreatic node demonstrates mild low attenuation, likely reflecting metastatic disease. 5.  2.6 cm adrenal mass may reflect metastatic disease, though it is nonspecific in appearance. 6.  Focal left basilar airspace opacification may reflect atelectasis or possibly pneumonia. 7.  Diffuse coronary artery calcifications seen. 8.  Bilateral renal cysts, particularly prominent on the left side. 9.  Diffuse calcification along the abdominal aorta and its branches. 10.  Markedly enlarged prostate noted, measuring 7.7 cm in size. 11.  Diffuse degenerative change noted along the lumbar spine.   Original Report Authenticated By: Tonia Ghent, M.D.     Assessment: Nausea vomiting and abdominal pain with CT discovery of widespread probable malignancy with primary suggesting the sigmoid colon Plan:  Proceed with flexible sigmoidoscopy with biopsy of mass later today. Neev Mcmains,Henry C 09/05/2012, 10:24 AM

## 2012-09-05 NOTE — Op Note (Signed)
[name] [address] [place], [address]   FLEXIBLE SIGMOIDOSCOPY PROCEDURE REPORT  PATIENT: Todd, [name]  MR#: [id number] BIRTHDATE: May 30, 1930 , 82  yrs. old GENDER: Male ENDOSCOPIST: Dorena Cookey, MD REFERRED BY: PROCEDURE DATE:  09/05/2012 PROCEDURE: ASA CLASS: INDICATIONS: metastatic abdominal malignancy on CT scan with sigmoid colon primary suggested MEDICATIONS: fentanyl 40 mcg, Versed 4 mg  DESCRIPTION OF PROCEDURE:   mucosa normal except for some bloody fluid up until about 35 cm where there was an abrupt transition to heart irregular friable tissue consistent with a circumferential mass. I could not clearly identify the lumen except at one particular time rate appeared to be slitlike and would not permit passage of the scope into it . I could see a small amount of stool above it. Multiple biopsies were taken to 35 cm no other abnormalities were seen       COMPLICATIONS:  None  ENDOSCOPIC IMPRESSION: sigmoid colon mass consistent with carcinoma appearance highly suggestive of obstruction or impending obstruction  RECOMMENDATIONS:  await pathology, surgery and oncology consults   _______________________________ eSignedDorena Cookey, MD 09/05/2012 2:31 PM

## 2012-09-05 NOTE — H&P (Signed)
[facility] History and Physical  Todd [name] RUE:[id number] DOB: Nov 03, 1929 DOA: 09/04/2012  Referring physician: Dr. Dione Booze PCP: [name], MD   Chief Complaint: Nausea, vomiting, diarrhea   History of Present Illness: Todd [name] is an 79 y.o. male who developed the sudden onset of nausea, vomiting, and diarrhea this evening.  No sick contacts.  The patient denies any fever, but has had chills.  Ate a normal dinner tonight (wife ate same food).  The patient had multiple episodes of vomiting, but this resolved after being given Zofran.  Also had 10 or so episodes of diarrhea stools with urgency.  Has diffuse abdominal pain.  The patient has never had a colonoscopy.  No recent antibiotics.  Review of Systems: Constitutional: No fever, + chills;  Appetite normal; No weight loss, + weight gain.  HEENT: No blurry vision, no diplopia, no pharyngitis, no dysphagia CV: No chest pain, no palpitations.  Resp: No SOB, + chronic cough secondary to post nasal drip. GI: + nausea, vomiting, diarrhea, no melena, + hematochezia x 1.  GU: No dysuria, no hematuria.  MSK: no myalgias, + arthralgias.  Neuro:  No headache, no focal neurological deficits, no history of seizures.  Psych: No depression, no anxiety.  Endo: No thyroid disease, + h/o DM that is diet controlled, no heat intolerance, no cold intolerance, no polyuria, no polydipsia  Skin: No rashes, no skin lesions.  Heme: No easy bruising, no history of blood diseases.  Past Medical History Past Medical History  Diagnosis Date  . Myocardial infarction   . Gout   . Hypertension   . Arthritis, rheumatoid   . Diabetes mellitus without complication   . CHF (congestive heart failure)   . BPH (benign prostatic hyperplasia)   . Neuropathy   . Hyperlipidemia      Past Surgical History Past Surgical History  Procedure Laterality Date  . Cataract extraction       Social History: History   Social History  . Marital Status:  Married    Spouse Name: N/A    Number of Children: 2  . Years of Education: N/A   Occupational History  . Retired, food and nutritional services    Social History Main Topics  . Smoking status: Former Games developer  . Smokeless tobacco: Never Used     Comment: Quit 45 years ago.  . Alcohol Use: Yes     Comment: 1 drink a month  . Drug Use: No  . Sexually Active: Not on file   Other Topics Concern  . Not on file   Social History Narrative   Emergency Contact:wife, Todd [name] at [phone]   Health Care POA: Wife, has living will and wishes to be a DNR.   Who lives with you: Lives with wife in 1 story home.   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  Has a varied diet and is currently trying to lose weight by decreasing protein and carbohydrates daily.   Exercise: water aerobix 3x a week for 45 mins and uses row bike 2 times a week for 20 minutes   Seatbelts: wears seatbelt while in vehicles   Sun Exposure/Protection: Patient reports wearing sun screen   Hobbies: numbers, cruises          Family History:  Family History  Problem Relation Age of Onset  . Stroke Father   . Diabetes Father   . Hypertension Father   .  Heart attack Father   . Hyperlipidemia Neg Hx   . Sudden death Neg Hx     Allergies: Review of patient's allergies indicates no active allergies.  Meds: Prior to Admission medications   Medication Sig Start Date End Date Taking? Authorizing Provider  psyllium (METAMUCIL) 58.6 % powder Take 1 packet by mouth 2 (two) times daily.   Yes Historical Provider, MD  aspirin 81 MG tablet Take 81 mg by mouth daily.      Historical Provider, MD  benazepril (LOTENSIN) 40 MG tablet Take 1 tablet (40 mg total) by mouth daily. 01/18/12   [name], MD  colchicine (COLCRYS) 0.6 MG tablet Take 1 tablet by mouth every 4 hours until gouty attack subsides, or until diarrhea/GI upset. 01/18/12   [name], MD   diclofenac (CATAFLAM) 50 MG tablet Take 1 tablet as needed     Historical Provider, MD  diclofenac (VOLTAREN) 75 MG EC tablet TAKE 1 TABLET BY MOUTH TWICE DAILY 07/07/12   [name], MD  doxazosin (CARDURA) 8 MG tablet Take 8 mg by mouth daily.      Historical Provider, MD  felodipine (PLENDIL) 5 MG 24 hr tablet TAKE 1 TABLET BY MOUTH EVERY DAY 08/11/12   [name], MD  finasteride (PROSCAR) 5 MG tablet Take 1 tablet (5 mg total) by mouth daily. 07/14/12   Nestor Ramp, MD  gabapentin (NEURONTIN) 300 MG capsule Take 1 capsule (300 mg total) by mouth 3 (three) times daily. 07/07/12   [name], MD  Glucosamine-Chondroit-Vit C-Mn (GLUCOSAMINE CHONDR 1500 COMPLX PO) Take 1 capsule by mouth 3 (three) times daily.      Historical Provider, MD  hydrochlorothiazide (HYDRODIURIL) 25 MG tablet Take 1 tablet (25 mg total) by mouth daily. 01/18/12   [name], MD  isosorbide mononitrate (IMDUR) 60 MG 24 hr tablet Take 1 tablet (60 mg total) by mouth daily. 01/18/12 01/17/13  [name], MD  loratadine (CLARITIN) 10 MG tablet Take 10 mg by mouth daily.      Historical Provider, MD  metoprolol succinate (TOPROL-XL) 50 MG 24 hr tablet TAKE 1 TABLET BY MOUTH EVERY DAY 07/21/12   [name], MD  mometasone (ELOCON) 0.1 % cream Apply topically daily. 06/29/11   Edd Arbour, MD  Multiple Vitamin (MULTIVITAMIN) tablet Take 1 tablet by mouth daily.      Historical Provider, MD  nitroGLYCERIN (NITRODUR - DOSED IN MG/24 HR) 0.2 mg/hr Place 1 patch (0.2 mg total) onto the skin daily. 06/29/11   Edd Arbour, MD  nitroGLYCERIN (NITROSTAT) 0.4 MG SL tablet Place 0.4 mg under the tongue every 5 (five) minutes as needed.      Historical Provider, MD  Omega-3 Fatty Acids (FISH OIL DOUBLE STRENGTH) 1200 MG CAPS Take 1 capsule by mouth 2 (two) times daily.      Historical Provider, MD  simvastatin (ZOCOR) 40 MG tablet Take 1 tablet (40 mg total) by mouth at bedtime. 01/18/12   [name], MD  Specialty  Vitamins Products [facility]) TABS Take 1 tablet by mouth daily.      Historical Provider, MD  Tamsulosin HCl (FLOMAX) 0.4 MG CAPS  01/10/12   Historical Provider, MD  VOLTAREN 1 % GEL APPLY TO THE AFFECTED AREA UP TO FOUR TIMES DAILY 03/21/12   [name], MD  zoster vaccine live, PF, (ZOSTAVAX) 16109 UNT/0.65ML injection Inject into the skin once. Dispense to patient to be admin in doctors office  Historical Provider, MD    Physical Exam: Filed Vitals:   09/05/12 0115 09/05/12 0145 09/05/12 0149 09/05/12 0259  BP:   126/55 146/56  Pulse: 62 63 68 77  Temp:    98.2 F (36.8 C)  TempSrc:    Oral  Resp: 20 24 18 19   Height:    5\' 10"  (1.778 m)  Weight:    99.791 kg (220 lb)  SpO2: 98% 96% 96% 98%     Physical Exam: Blood pressure 146/56, pulse 77, temperature 98.2 F (36.8 C), temperature source Oral, resp. rate 19, height 5\' 10"  (1.778 m), weight 99.791 kg (220 lb), SpO2 98.00%. Gen: No acute distress. Head: Normocephalic, atraumatic. Eyes: PERRL, EOMI, sclerae nonicteric. Mouth: Oropharynx with dry mucous membranes, fair dentition. Neck: Supple, no thyromegaly, no lymphadenopathy, no jugular venous distention. Chest: Lungsclear to auscultation bilaterally with good air movement. CV: Heart sounds regular rate and rhythm. No murmurs, rubs, or gallops. Abdomen: Soft, diffusely tender, nondistended with normal active bowel sounds. Extremities: Extremities with 3+ edema bilaterally,left greater than right. Skin: Warm and dry. Neuro: Alert and oriented times 3; cranial nerves II through XII grossly intact. Psych: Mood and affect normal.  Labs on Admission:  Basic Metabolic Panel:  Recent Labs Lab 09/04/12 2215  NA 139  K 3.8  CL 99  CO2 23  GLUCOSE 219*  BUN 31*  CREATININE 1.10  CALCIUM 10.4   Liver Function Tests:  Recent Labs Lab 09/04/12 2215  AST 39*  ALT 28  ALKPHOS 173*  BILITOT 0.4  PROT 8.0  ALBUMIN 4.0    Recent Labs Lab 09/04/12 2215  LIPASE  104*   CBC:  Recent Labs Lab 09/04/12 2215  WBC 19.0*  NEUTROABS 16.2*  HGB 13.6  HCT 40.4  MCV 86.7  PLT 246   Cardiac Enzymes:  Recent Labs Lab 09/04/12 2215  TROPONINI <0.30    Radiological Exams on Admission: Ct Abdomen Pelvis W Contrast  09/05/2012  *RADIOLOGY REPORT*  Clinical Data: Nausea, vomiting, abdominal pain and cough.  Chills.  CT ABDOMEN AND PELVIS WITH CONTRAST  Technique:  Multidetector CT imaging of the abdomen and pelvis was performed following the standard protocol during bolus administration of intravenous contrast.  Contrast:  100 mL of Omnipaque 300 IV contrast  Comparison: Lumbar spine radiographs performed 03/25/2009  Findings: Left basilar airspace opacity may reflect atelectasis or possibly pneumonia.  Diffuse coronary artery calcifications are noted.  Multiple mildly heterogeneous slightly ill-defined masses are seen throughout the right hepatic lobe, the largest of which measures 11.5 cm.  This is most compatible with metastatic disease.  The spleen is unremarkable in appearance.  The pancreas is grossly unremarkable.  Note is made of a somewhat low attenuation node adjacent to the proximal pancreatic body, measuring 2.4 x 1.5 cm, likely reflecting metastatic disease.  There is a 2.6 cm right adrenal mass; this may also reflect metastatic disease.  The left adrenal gland is unremarkable in appearance.  Left renal cysts are noted, measuring up to 10.3 cm in size, with minimal peripheral calcification.  No definite suspicious masses are seen.  Small right renal cysts are also seen.  Mild nonspecific perinephric stranding is noted bilaterally.  There is no evidence of hydronephrosis.  No renal or ureteral stones are seen.  The small bowel is unremarkable in appearance.  The stomach is within normal limits.  No acute vascular abnormalities are seen. Diffuse calcification is noted along the abdominal aorta and its branches.  The suspected primary malignancy  is noted at  the mid sigmoid colon, measuring approximately 5.2 x 4.7 x 4.1 cm, with scattered small adjacent nodules reflecting local spread of disease.  There is no evidence of obstruction; no perforation is seen.  Trace associated free fluid is noted.  There is mild diffuse wall thickening noted involving the proximal sigmoid colon, possibly reflecting chronic inflammation due to the more distal mass.  The remainder of the colon is grossly unremarkable.  The appendix is not definitely seen.  Scattered small pelvic sidewall nodes may remain within normal limits; there is diffuse haziness within the retroperitoneum, without definite retroperitoneal lymphadenopathy.  The bladder is mildly distended and grossly unremarkable; a very large prostate is noted, measuring approximately 7.7 x 6.4 x 7.3 cm.  Minimal associated calcification is seen.  No inguinal lymphadenopathy is seen.  No acute osseous abnormalities are identified.  Multilevel vacuum phenomenon and disc space narrowing are noted along the lumbar spine, with underlying facet disease.  IMPRESSION:  1.  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  No evidence for obstruction; no perforation seen.  Trace associated free fluid noted. 2.  Mild diffuse wall thickening involving the proximal sigmoid colon may reflect chronic inflammation due to the more distal mass; the remaining colon is unremarkable. 3.  Multiple mildly heterogeneous slightly ill-defined masses throughout the right hepatic lobe, measuring up to 11.5 cm, compatible with metastatic disease. 4.  2.4 x 1.5 cm peripancreatic node demonstrates mild low attenuation, likely reflecting metastatic disease. 5.  2.6 cm adrenal mass may reflect metastatic disease, though it is nonspecific in appearance. 6.  Focal left basilar airspace opacification may reflect atelectasis or possibly pneumonia. 7.   Diffuse coronary artery calcifications seen. 8.  Bilateral renal cysts, particularly prominent on the left side. 9.  Diffuse calcification along the abdominal aorta and its branches. 10.  Markedly enlarged prostate noted, measuring 7.7 cm in size. 11.  Diffuse degenerative change noted along the lumbar spine.   Original Report Authenticated By: Tonia Ghent, M.D.     EKG: Independently reviewed. Normal sinus rhythm. Q waves noted in V1 and V2.  Assessment/Plan Principal Problem:   Nausea with vomiting and diarrhea  Treat empirically for colitis given colon wall thickening with Cipro and Flagyl.  Stool studies including culture and C. Difficile PCR requested.  Antinausea medications as needed. Hydrate. Active Problems:   DIABETES MELLITUS, CONTROLLED, WITHOUT COMPLICATIONS  Normally diet controlled.  Check hemoglobin A 1C.  Start sliding scale insulin every 4 hours while n.p.o.   HYPERLIPIDEMIA  Hold statin and fish oil for now.   Gout, unspecified  Hold colchicine for now.   OBESITY, NOS  Dietician consult when diet advanced.   HYPERTENSION, BENIGN SYSTEMIC  Hold medications until they can be verified. (Wife will bring in later today).   CHF - EJECTION FRACTION < 50%  Well compensated clinically.  Watch I&O closely.    Colonic mass  Will need further GI evaluation.  Does not have a GI doctor and has never had a colonoscopy.   Elevated lactic acid level  Likely from intra-abdominal process.  No perforation noted on CT scan.   Elevated lipase  May be from pancreatitis, or intra-abdominal process.  Hydrate, NPO, pain medication as needed.  No pancreatic inflammation noted on CT scan, but may have a metastatic nodule in the pancreas.   Leukocytosis, unspecified  Likely from inflammation versus  infection of the colon.  Treat empirically with antibiotics for now.   Prostate enlargement  Resume medication when current list is updated and verified.  Code Status: Living  will.  DNR. Family Communication: Charlesetta Garibaldi (wife) [phone] and daughter updated at bedside. Disposition Plan: Home.  Time spent: 1 hour.  Keta [place]burgh [facility] Pager [phone]  If 7PM-7AM, please contact night-coverage [email or web address] Password [email or web address] 09/05/2012, 4:11 AM

## 2012-09-05 NOTE — Progress Notes (Signed)
Family Medicine Teaching Service Assumption of Care Service Pager: [phone]  Patient name: Todd [name] Medical record number: [id number] Date of birth: 10-19-1929 Age: 79 y.o. Gender: male  Primary Care Provider: [name], MD Consultants: GI  CODE STATUS: DNR  Interim History  Todd [name] is a 79 y.o. year old male presented to the [facility] med [facility] emergency department with new onset nausea, vomiting, diarrhea.  Upon further evaluation in the emergency department CT scan revealed a concern for primary colonic malignancy the mid sigmoid colon.  He is admitted initially by [facility] hospitalists but due to a missed appointment with Dr. Paula Compton in the [facility] it was noted that he was hospitalized and is transferred to our service.  I evaluated the patient and discussed with him the plan for pursuing workup including involvement of gastroenterology to hopefully obtain a tissue biopsy.  HISTORY  PMHx:  Past Medical History  Diagnosis Date  . Myocardial infarction 1990    "Massive"  . Gout   . Hypertension   . Arthritis, rheumatoid   . Diabetes mellitus without complication   . CHF (congestive heart failure)   . BPH (benign prostatic hyperplasia)   . Neuropathy   . Hyperlipidemia     PSHx: Past Surgical History  Procedure Laterality Date  . Cataract extraction      Social Hx: History   Social History  . Marital Status: Married    Spouse Name: N/A    Number of Children: 2  . Years of Education: N/A   Occupational History  . Retired, food and nutritional services    Social History Main Topics  . Smoking status: Former Games developer  . Smokeless tobacco: Never Used     Comment: Quit 45 years ago.  . Alcohol Use: Yes     Comment: 1 drink a month  . Drug Use: No  . Sexually Active: Not Currently   Other Topics Concern  . None   Social History Narrative   Emergency Contact:wife, Jamine Highfill at [phone]   Health Care POA:   Who  lives with you: Lives with wife in 1 story home.   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  Has a varied diet and is currently trying to lose weight by decreasing protein and carbohydrates daily.   Exercise: water aerobix 3x a week for 45 mins and uses row bike 2 times a week for 20 minutes   Seatbelts: wears seatbelt while in vehicles   Sun Exposure/Protection: Patient reports wearing sun screen   Hobbies: numbers, cruises             Family Hx: Family History  Problem Relation Age of Onset  . Stroke Father   . Diabetes Father   . Hypertension Father   . Heart attack Father   . Hyperlipidemia Neg Hx   . Sudden death Neg Hx     Allergies: No Known Allergies  Home Medications: Prescriptions prior to admission  Medication Sig Dispense Refill  . aspirin 81 MG tablet Take 81 mg by mouth daily.        . colchicine 0.6 MG tablet Take 0.6 mg by mouth daily as needed (for gout flares).      . diclofenac (VOLTAREN) 75 MG EC tablet Take 75 mg by mouth 2 (two) times daily.      Marland Kitchen doxazosin (CARDURA) 8 MG tablet Take 8 mg by mouth  daily.       . felodipine (PLENDIL) 5 MG 24 hr tablet Take 5 mg by mouth daily.      Marland Kitchen gabapentin (NEURONTIN) 300 MG capsule Take 1 capsule (300 mg total) by mouth 3 (three) times daily.  270 capsule  3  . hydrochlorothiazide (HYDRODIURIL) 25 MG tablet Take 1 tablet (25 mg total) by mouth daily.  90 tablet  3  . isosorbide mononitrate (IMDUR) 60 MG 24 hr tablet Take 1 tablet (60 mg total) by mouth daily.  90 tablet  3  . loratadine (CLARITIN) 10 MG tablet Take 10 mg by mouth daily.        . metoprolol succinate (TOPROL-XL) 50 MG 24 hr tablet Take 50 mg by mouth daily. Take with or immediately following a meal.      . Multiple Vitamin (MULTIVITAMIN) tablet Take 1 tablet by mouth daily.        . nitroGLYCERIN (NITROSTAT) 0.4 MG SL tablet Place 0.4 mg under the tongue every 5 (five) minutes as needed.         . Omega-3 Fatty Acids (FISH OIL DOUBLE STRENGTH) 1200 MG CAPS Take 1 capsule by mouth 2 (two) times daily.        . psyllium (METAMUCIL) 58.6 % powder Take 1 packet by mouth 2 (two) times daily.      . simvastatin (ZOCOR) 40 MG tablet Take 1 tablet (40 mg total) by mouth at bedtime.  90 tablet  3  . [DISCONTINUED] benazepril (LOTENSIN) 40 MG tablet Take 1 tablet (40 mg total) by mouth daily.  90 tablet  3  . zoster vaccine live, PF, (ZOSTAVAX) 40981 UNT/0.65ML injection Inject into the skin once. Dispense to patient to be admin in doctors office         OBJECTIVE  Vitals: Temp:  [97.6 F (36.4 C)-100.7 F (38.2 C)] 100.7 F (38.2 C) (02/11 2100) Pulse Rate:  [63-77] 69 (02/11 2100) Resp:  [9-49] 18 (02/11 2100) BP: (96-146)/(43-80) 96/43 mmHg (02/11 2100) SpO2:  [90 %-98 %] 97 % (02/11 2100) Weight:  [220 lb (99.791 kg)] 220 lb (99.791 kg) (02/11 0455)  Weight: Wt Readings from Last 3 Encounters:  09/05/12 220 lb (99.791 kg)  09/05/12 220 lb (99.791 kg)  07/07/12 216 lb (97.977 kg)    I&Os: Yesterday: 02/11 0701 - 02/12 0700 In: 360 [P.O.:360] Out: 175 [Urine:175] This shift:     PE: GENERAL:  Elderly caucasian  male. In no discomfort; no respiratory distress. PSYCH: Alert and appropriately interactive; Insight:Good   H&N: AT/[place], trachea midline EENT:  MMM, no scleral icterus, EOMi HEART: RRR, S1/S2 heard, no murmur appreciated but distant heart sounds LUNGS: CTA B, no wheezes, no crackles EXTREMITIES: Moves all 4 extremities spontaneously, warm well perfused, no edema, bilateral DP and PT pulses 2/4.        LABS: CBC BMET   Recent Labs Lab 09/04/12 2215  WBC 19.0*  HGB 13.6  HCT 40.4  PLT 246    Recent Labs Lab 09/04/12 2215  NA 139  K 3.8  CL 99  CO2 23  BUN 31*  CREATININE 1.10  GLUCOSE 219*  CALCIUM 10.4     URINE STUDIES: Results for LOT, MEDFORD (MRN [id number]) as of 09/06/2012 01:23  09/05/2012 08:37  Color, Urine AMBER (A)   APPearance CLEAR  Specific Gravity, Urine 1.010  pH 5.0  Glucose NEGATIVE  Bilirubin Urine NEGATIVE  Ketones, ur 15 (A)  Protein NEGATIVE  Urobilinogen, UA 0.2  Nitrite POSITIVE (  A)  Leukocytes, UA NEGATIVE  Hgb urine dipstick NEGATIVE  WBC, UA 0-2  RBC / HPF 0-2  Bacteria, UA RARE  Casts HYALINE CASTS (A)    MICRO: Stool Culture  IMAGING: Ct Abdomen Pelvis W Contrast  09/05/2012  *RADIOLOGY REPORT*  Clinical Data: Nausea, vomiting, abdominal pain and cough.  Chills.  CT ABDOMEN AND PELVIS WITH CONTRAST  Technique:  Multidetector CT imaging of the abdomen and pelvis was performed following the standard protocol during bolus administration of intravenous contrast.  Contrast:  100 mL of Omnipaque 300 IV contrast  Comparison: Lumbar spine radiographs performed 03/25/2009  Findings: Left basilar airspace opacity may reflect atelectasis or possibly pneumonia.  Diffuse coronary artery calcifications are noted.  Multiple mildly heterogeneous slightly ill-defined masses are seen throughout the right hepatic lobe, the largest of which measures 11.5 cm.  This is most compatible with metastatic disease.  The spleen is unremarkable in appearance.  The pancreas is grossly unremarkable.  Note is made of a somewhat low attenuation node adjacent to the proximal pancreatic body, measuring 2.4 x 1.5 cm, likely reflecting metastatic disease.  There is a 2.6 cm right adrenal mass; this may also reflect metastatic disease.  The left adrenal gland is unremarkable in appearance.  Left renal cysts are noted, measuring up to 10.3 cm in size, with minimal peripheral calcification.  No definite suspicious masses are seen.  Small right renal cysts are also seen.  Mild nonspecific perinephric stranding is noted bilaterally.  There is no evidence of hydronephrosis.  No renal or ureteral stones are seen.  The small bowel is unremarkable in appearance.  The stomach is within normal limits.  No acute vascular abnormalities  are seen. Diffuse calcification is noted along the abdominal aorta and its branches.  The suspected primary malignancy is noted at the mid sigmoid colon, measuring approximately 5.2 x 4.7 x 4.1 cm, with scattered small adjacent nodules reflecting local spread of disease.  There is no evidence of obstruction; no perforation is seen.  Trace associated free fluid is noted.  There is mild diffuse wall thickening noted involving the proximal sigmoid colon, possibly reflecting chronic inflammation due to the more distal mass.  The remainder of the colon is grossly unremarkable.  The appendix is not definitely seen.  Scattered small pelvic sidewall nodes may remain within normal limits; there is diffuse haziness within the retroperitoneum, without definite retroperitoneal lymphadenopathy.  The bladder is mildly distended and grossly unremarkable; a very large prostate is noted, measuring approximately 7.7 x 6.4 x 7.3 cm.  Minimal associated calcification is seen.  No inguinal lymphadenopathy is seen.  No acute osseous abnormalities are identified.  Multilevel vacuum phenomenon and disc space narrowing are noted along the lumbar spine, with underlying facet disease.  IMPRESSION:  1.  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  No evidence for obstruction; no perforation seen.  Trace associated free fluid noted. 2.  Mild diffuse wall thickening involving the proximal sigmoid colon may reflect chronic inflammation due to the more distal mass; the remaining colon is unremarkable. 3.  Multiple mildly heterogeneous slightly ill-defined masses throughout the right hepatic lobe, measuring up to 11.5 cm, compatible with metastatic disease. 4.  2.4 x 1.5 cm peripancreatic node demonstrates mild low attenuation, likely reflecting metastatic disease. 5.  2.6 cm adrenal mass may reflect metastatic disease, though it  is nonspecific in appearance. 6.  Focal left basilar airspace opacification may reflect atelectasis or possibly pneumonia. 7.  Diffuse coronary artery calcifications seen. 8.  Bilateral renal cysts, particularly prominent on the left side. 9.  Diffuse calcification along the abdominal aorta and its branches. 10.  Markedly enlarged prostate noted, measuring 7.7 cm in size. 11.  Diffuse degenerative change noted along the lumbar spine.   Original Report Authenticated By: Tonia Ghent, M.D.      Medications:    . ciprofloxacin  400 mg Intravenous Q12H  . doxazosin  8 mg Oral Daily  . gabapentin  300 mg Oral TID  . insulin aspart  0-9 Units Subcutaneous Q4H  . isosorbide mononitrate  60 mg Oral Daily  . metronidazole  500 mg Intravenous Q8H  . psyllium  1 packet Oral BID  . sodium chloride  3 mL Intravenous Q12H    Assessment & Plan  LOS: 4 79 y.o. year old male with acute onset N/V, diarrhea found to have colonic mass on CT Scan.  # Colonic Mass with apparent mets to Liver, adrenals: Likely Primary Colon CA. will continue ABX at this time but low yield, cultures pending  GI referral for tissue sample  # Nausea/Vomiting & Diarrhea:   Currently NPO, advance following procedures  stool pending,   on Cipro/Flagyl  Zofran   # Hx of MI with CAD and CHF: Followed by [facility]  Currently stable, on tele  # DM:  A1c pending  SSI while NPO  # HLD, CHF, HTN,   po meds held, will monitor  # Leukocytosis: Continue to trend, likely inflammation  --- FEN  *NS @ 100cc/hr -NPO > advance  --- PPx: SCDs    Disposition  Pending further eval from Gastroenterology   Andrena Mews, DO [facility] Resident - PGY-1 09/06/2012 1:35 AM

## 2012-09-05 NOTE — Progress Notes (Signed)
Addendum to consullt: Circumferential obstructing mass at the sigmoid colon at approximately 35 cm unable to be traversed with the scope. Path pending. See formal report for more detail and recommendations.

## 2012-09-05 NOTE — ED Notes (Signed)
Flagyl infusing cipro on poem waituing to infuse

## 2012-09-05 NOTE — Consult Note (Signed)
Reason for Consult:sigmoid colon mass Referring Physician: Ramsay Bognar is an 79 y.o. male.  HPI: Patient is a very pleasant gentleman and is a retired former Nature conservation officer at [employer].  He describes some intermittent abdominal pain over the past several weeks. He also had some constipation which he thought might be due to his prostate medication. He added Metamucil to his diet. That improved. He has had no hematochezia. He is never had a colonoscopy prior to this admission.The day prior to admission, he developed significant nausea and vomiting. He was admitted to family medicine teaching service. Workup has included CT scan of the abdomen and pelvis. This shows a sigmoid colon mass as well as evidence of multiple right-sided liver tumors very suspicious for metastatic colon cancer. There are also some enlarged abdominal lymph nodes.  He underwent flexible sigmoidoscopy today by Dr. Dorena Cookey from [facility] gastroenterology. He was found to have a nearly obstructing mass in the sigmoid colon at 35 cm. The scope was not able to be passed beyond the mass. Multiple biopsies were taken and these are pending. His wife and daughter are present and assist with the history. Dr. Paula Compton is his primary care physician. He sees [facility] for cardiology. Dr. Alexis Frock is his urologist.  Past Medical History  Diagnosis Date  . Myocardial infarction   . Gout   . Hypertension   . Arthritis, rheumatoid   . Diabetes mellitus without complication   . CHF (congestive heart failure)   . BPH (benign prostatic hyperplasia)   . Neuropathy   . Hyperlipidemia     Past Surgical History  Procedure Laterality Date  . Cataract extraction      Family History  Problem Relation Age of Onset  . Stroke Father   . Diabetes Father   . Hypertension Father   . Heart attack Father   . Hyperlipidemia Neg Hx   . Sudden death Neg Hx     Social History:   reports that he has quit smoking. He has never used smokeless tobacco. He reports that  drinks alcohol. He reports that he does not use illicit drugs.  Allergies: No Known Allergies  Medications:  Prior to Admission:  Prescriptions prior to admission  Medication Sig Dispense Refill  . aspirin 81 MG tablet Take 81 mg by mouth daily.        . colchicine 0.6 MG tablet Take 0.6 mg by mouth daily as needed (for gout flares).      . diclofenac (VOLTAREN) 75 MG EC tablet Take 75 mg by mouth 2 (two) times daily.      Marland Kitchen doxazosin (CARDURA) 8 MG tablet Take 8 mg by mouth daily.       . felodipine (PLENDIL) 5 MG 24 hr tablet Take 5 mg by mouth daily.      Marland Kitchen gabapentin (NEURONTIN) 300 MG capsule Take 1 capsule (300 mg total) by mouth 3 (three) times daily.  270 capsule  3  . hydrochlorothiazide (HYDRODIURIL) 25 MG tablet Take 1 tablet (25 mg total) by mouth daily.  90 tablet  3  . isosorbide mononitrate (IMDUR) 60 MG 24 hr tablet Take 1 tablet (60 mg total) by mouth daily.  90 tablet  3  . loratadine (CLARITIN) 10 MG tablet Take 10 mg by mouth daily.        . metoprolol succinate (TOPROL-XL) 50 MG 24 hr tablet Take 50 mg by mouth daily. Take with or immediately  following a meal.      . Multiple Vitamin (MULTIVITAMIN) tablet Take 1 tablet by mouth daily.        . nitroGLYCERIN (NITROSTAT) 0.4 MG SL tablet Place 0.4 mg under the tongue every 5 (five) minutes as needed.        . Omega-3 Fatty Acids (FISH OIL DOUBLE STRENGTH) 1200 MG CAPS Take 1 capsule by mouth 2 (two) times daily.        . psyllium (METAMUCIL) 58.6 % powder Take 1 packet by mouth 2 (two) times daily.      . simvastatin (ZOCOR) 40 MG tablet Take 1 tablet (40 mg total) by mouth at bedtime.  90 tablet  3  . [DISCONTINUED] benazepril (LOTENSIN) 40 MG tablet Take 1 tablet (40 mg total) by mouth daily.  90 tablet  3  . zoster vaccine live, PF, (ZOSTAVAX) 19147 UNT/0.65ML injection Inject into the skin once. Dispense to patient to be admin in  doctors office         Results for orders placed during the hospital encounter of 09/04/12 (from the past 48 hour(s))  COMPREHENSIVE METABOLIC PANEL     Status: Abnormal   Collection Time    09/04/12 10:15 PM      Result Value Range   Sodium 139  135 - 145 mEq/L   Potassium 3.8  3.5 - 5.1 mEq/L   Chloride 99  96 - 112 mEq/L   CO2 23  19 - 32 mEq/L   Glucose, Bld 219 (*) 70 - 99 mg/dL   BUN 31 (*) 6 - 23 mg/dL   Creatinine, Ser 8.29  0.50 - 1.35 mg/dL   Calcium 56.2  8.4 - 13.0 mg/dL   Total Protein 8.0  6.0 - 8.3 g/dL   Albumin 4.0  3.5 - 5.2 g/dL   AST 39 (*) 0 - 37 U/L   ALT 28  0 - 53 U/L   Alkaline Phosphatase 173 (*) 39 - 117 U/L   Total Bilirubin 0.4  0.3 - 1.2 mg/dL   GFR calc non Af Amer 61 (*) >90 mL/min   GFR calc Af Amer 71 (*) >90 mL/min   Comment:            The eGFR has been calculated     using the CKD EPI equation.     This calculation has not been     validated in all clinical     situations.     eGFR's persistently     <90 mL/min signify     possible Chronic Kidney Disease.  CBC WITH DIFFERENTIAL     Status: Abnormal   Collection Time    09/04/12 10:15 PM      Result Value Range   WBC 19.0 (*) 4.0 - 10.5 K/uL   RBC 4.66  4.22 - 5.81 MIL/uL   Hemoglobin 13.6  13.0 - 17.0 g/dL   HCT 86.5  78.4 - 69.6 %   MCV 86.7  78.0 - 100.0 fL   MCH 29.2  26.0 - 34.0 pg   MCHC 33.7  30.0 - 36.0 g/dL   RDW 29.5  28.4 - 13.2 %   Platelets 246  150 - 400 K/uL   Neutrophils Relative 85 (*) 43 - 77 %   Neutro Abs 16.2 (*) 1.7 - 7.7 K/uL   Lymphocytes Relative 11 (*) 12 - 46 %   Lymphs Abs 2.2  0.7 - 4.0 K/uL   Monocytes Relative 2 (*) 3 - 12 %  Monocytes Absolute 0.4  0.1 - 1.0 K/uL   Eosinophils Relative 1  0 - 5 %   Eosinophils Absolute 0.2  0.0 - 0.7 K/uL   Basophils Relative 0  0 - 1 %   Basophils Absolute 0.0  0.0 - 0.1 K/uL  LIPASE, BLOOD     Status: Abnormal   Collection Time    09/04/12 10:15 PM      Result Value Range   Lipase 104 (*) 11 - 59 U/L   LACTIC ACID, PLASMA     Status: Abnormal   Collection Time    09/04/12 10:15 PM      Result Value Range   Lactic Acid, Venous 3.3 (*) 0.5 - 2.2 mmol/L  TROPONIN I     Status: None   Collection Time    09/04/12 10:15 PM      Result Value Range   Troponin I <0.30  <0.30 ng/mL   Comment:            Due to the release kinetics of cTnI,     a negative result within the first hours     of the onset of symptoms does not rule out     myocardial infarction with certainty.     If myocardial infarction is still suspected,     repeat the test at appropriate intervals.  STOOL CULTURE     Status: None   Collection Time    09/04/12 11:30 PM      Result Value Range   Specimen Description STOOL     Special Requests Normal     Culture Culture reincubated for better growth     Report Status PENDING    GLUCOSE, CAPILLARY     Status: Abnormal   Collection Time    09/05/12  5:45 AM      Result Value Range   Glucose-Capillary 160 (*) 70 - 99 mg/dL  HEMOGLOBIN N8G     Status: Abnormal   Collection Time    09/05/12  6:55 AM      Result Value Range   Hemoglobin A1C 6.2 (*) <5.7 %   Comment: (NOTE)                                                                               According to the ADA Clinical Practice Recommendations for 2011, when     HbA1c is used as a screening test:      >=6.5%   Diagnostic of Diabetes Mellitus               (if abnormal result is confirmed)     5.7-6.4%   Increased risk of developing Diabetes Mellitus     References:Diagnosis and Classification of Diabetes Mellitus,Diabetes     Care,2011,34(Suppl 1):S62-S69 and Standards of Medical Care in             Diabetes - 2011,Diabetes Care,2011,34 (Suppl 1):S11-S61.   Mean Plasma Glucose 131 (*) <117 mg/dL  GLUCOSE, CAPILLARY     Status: Abnormal   Collection Time    09/05/12  8:26 AM      Result Value Range   Glucose-Capillary 162 (*) 70 - 99 mg/dL   Comment 1  Notify RN    URINALYSIS, ROUTINE W REFLEX MICROSCOPIC      Status: Abnormal   Collection Time    09/05/12  8:37 AM      Result Value Range   Color, Urine AMBER (*) YELLOW   Comment: BIOCHEMICALS MAY BE AFFECTED BY COLOR   APPearance CLEAR  CLEAR   Specific Gravity, Urine 1.010  1.005 - 1.030   pH 5.0  5.0 - 8.0   Glucose, UA NEGATIVE  NEGATIVE mg/dL   Hgb urine dipstick NEGATIVE  NEGATIVE   Bilirubin Urine NEGATIVE  NEGATIVE   Ketones, ur 15 (*) NEGATIVE mg/dL   Protein, ur NEGATIVE  NEGATIVE mg/dL   Urobilinogen, UA 0.2  0.0 - 1.0 mg/dL   Nitrite POSITIVE (*) NEGATIVE   Leukocytes, UA NEGATIVE  NEGATIVE  URINE MICROSCOPIC-ADD ON     Status: Abnormal   Collection Time    09/05/12  8:37 AM      Result Value Range   WBC, UA 0-2  <3 WBC/hpf   RBC / HPF 0-2  <3 RBC/hpf   Bacteria, UA RARE  RARE   Casts HYALINE CASTS (*) NEGATIVE  GLUCOSE, CAPILLARY     Status: Abnormal   Collection Time    09/05/12 11:48 AM      Result Value Range   Glucose-Capillary 143 (*) 70 - 99 mg/dL   Comment 1 Notify RN    GLUCOSE, CAPILLARY     Status: Abnormal   Collection Time    09/05/12  4:30 PM      Result Value Range   Glucose-Capillary 130 (*) 70 - 99 mg/dL   Comment 1 Notify RN      Ct Abdomen Pelvis W Contrast  09/05/2012  *RADIOLOGY REPORT*  Clinical Data: Nausea, vomiting, abdominal pain and cough.  Chills.  CT ABDOMEN AND PELVIS WITH CONTRAST  Technique:  Multidetector CT imaging of the abdomen and pelvis was performed following the standard protocol during bolus administration of intravenous contrast.  Contrast:  100 mL of Omnipaque 300 IV contrast  Comparison: Lumbar spine radiographs performed 03/25/2009  Findings: Left basilar airspace opacity may reflect atelectasis or possibly pneumonia.  Diffuse coronary artery calcifications are noted.  Multiple mildly heterogeneous slightly ill-defined masses are seen throughout the right hepatic lobe, the largest of which measures 11.5 cm.  This is most compatible with metastatic disease.  The spleen is  unremarkable in appearance.  The pancreas is grossly unremarkable.  Note is made of a somewhat low attenuation node adjacent to the proximal pancreatic body, measuring 2.4 x 1.5 cm, likely reflecting metastatic disease.  There is a 2.6 cm right adrenal mass; this may also reflect metastatic disease.  The left adrenal gland is unremarkable in appearance.  Left renal cysts are noted, measuring up to 10.3 cm in size, with minimal peripheral calcification.  No definite suspicious masses are seen.  Small right renal cysts are also seen.  Mild nonspecific perinephric stranding is noted bilaterally.  There is no evidence of hydronephrosis.  No renal or ureteral stones are seen.  The small bowel is unremarkable in appearance.  The stomach is within normal limits.  No acute vascular abnormalities are seen. Diffuse calcification is noted along the abdominal aorta and its branches.  The suspected primary malignancy is noted at the mid sigmoid colon, measuring approximately 5.2 x 4.7 x 4.1 cm, with scattered small adjacent nodules reflecting local spread of disease.  There is no evidence of obstruction; no perforation is seen.  Trace  associated free fluid is noted.  There is mild diffuse wall thickening noted involving the proximal sigmoid colon, possibly reflecting chronic inflammation due to the more distal mass.  The remainder of the colon is grossly unremarkable.  The appendix is not definitely seen.  Scattered small pelvic sidewall nodes may remain within normal limits; there is diffuse haziness within the retroperitoneum, without definite retroperitoneal lymphadenopathy.  The bladder is mildly distended and grossly unremarkable; a very large prostate is noted, measuring approximately 7.7 x 6.4 x 7.3 cm.  Minimal associated calcification is seen.  No inguinal lymphadenopathy is seen.  No acute osseous abnormalities are identified.  Multilevel vacuum phenomenon and disc space narrowing are noted along the lumbar spine, with  underlying facet disease.  IMPRESSION:  1.  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  No evidence for obstruction; no perforation seen.  Trace associated free fluid noted. 2.  Mild diffuse wall thickening involving the proximal sigmoid colon may reflect chronic inflammation due to the more distal mass; the remaining colon is unremarkable. 3.  Multiple mildly heterogeneous slightly ill-defined masses throughout the right hepatic lobe, measuring up to 11.5 cm, compatible with metastatic disease. 4.  2.4 x 1.5 cm peripancreatic node demonstrates mild low attenuation, likely reflecting metastatic disease. 5.  2.6 cm adrenal mass may reflect metastatic disease, though it is nonspecific in appearance. 6.  Focal left basilar airspace opacification may reflect atelectasis or possibly pneumonia. 7.  Diffuse coronary artery calcifications seen. 8.  Bilateral renal cysts, particularly prominent on the left side. 9.  Diffuse calcification along the abdominal aorta and its branches. 10.  Markedly enlarged prostate noted, measuring 7.7 cm in size. 11.  Diffuse degenerative change noted along the lumbar spine.   Original Report Authenticated By: Tonia Ghent, M.D.     Review of Systems  Constitutional: Negative for fever and chills.  HENT: Negative.   Eyes: Negative.   Respiratory: Negative.   Cardiovascular:       History of myocardial infarction in 1990, history of congestive heart failure, denies current chest pain  Gastrointestinal: Positive for nausea, vomiting, abdominal pain and constipation. Negative for diarrhea.       See history of present illness  Genitourinary:       Enlargement of the prostate  Musculoskeletal: Negative.   Skin: Negative.   Neurological: Negative.   Endo/Heme/Allergies: Negative.    Blood pressure 133/80, pulse 69, temperature 99.2 F (37.3 C), temperature  source Oral, resp. rate 23, height 5\' 10"  (1.778 m), weight 99.791 kg (220 lb), SpO2 93.00%. Physical Exam  Constitutional: He is oriented to person, place, and time. He appears well-developed and well-nourished. No distress.  HENT:  Head: Normocephalic.  Mouth/Throat: Oropharynx is clear and moist. No oropharyngeal exudate.  Eyes: Conjunctivae and EOM are normal. Pupils are equal, round, and reactive to light. No scleral icterus.  Neck: Normal range of motion. Neck supple. No tracheal deviation present.  Cardiovascular: Normal rate and regular rhythm.   Murmur heard. 2/6 holosystolic murmur, mild peripheral edema  Respiratory: Effort normal and breath sounds normal. No stridor. No respiratory distress. He has no wheezes. He has no rales.  GI: Soft. Bowel sounds are normal. He exhibits no distension and no mass. There is tenderness. There is no rebound and no guarding.  Mild tenderness mid abdomen and left lower quadrant without palpable mass, no guarding, no generalized peritoneal signs  Musculoskeletal: Normal range of motion.  Neurological: He is alert and oriented to person, place, and time.  Skin: Skin is warm.    Assessment/Plan: Near obstructing sigmoid colon mass at 35 cm. This is most likely a colon cancer with evidence of abdominal metastatic disease including the liver. I believe he will need resection of this colon mass on this admission. I will discuss the case in detail with my partner, Dr. Ezzard Standing. In light of the fact that this tumor is near obstructing, it is unlikely we will be able to prep his bowel. He may likely need resection with colostomy temporarily. Primary service also plans oncology consultation.  Their input will be important. In addition, we will discuss with the primary team and possibly have cardiology evaluation for preoperative planning tomorrow. I discussed the plan of care at length with the patient, his wife, and his daughter. I answered their  questions.  Amandajo Gonder E 09/05/2012, 7:40 PM

## 2012-09-05 NOTE — ED Notes (Signed)
Report called to Ron RN [id number] bed is ready bed

## 2012-09-06 ENCOUNTER — Inpatient Hospital Stay (HOSPITAL_COMMUNITY): Payer: Medicare Other

## 2012-09-06 ENCOUNTER — Encounter (HOSPITAL_COMMUNITY): Payer: Self-pay | Admitting: Sports Medicine

## 2012-09-06 DIAGNOSIS — I2510 Atherosclerotic heart disease of native coronary artery without angina pectoris: Secondary | ICD-10-CM | POA: Diagnosis present

## 2012-09-06 DIAGNOSIS — R011 Cardiac murmur, unspecified: Secondary | ICD-10-CM | POA: Diagnosis present

## 2012-09-06 DIAGNOSIS — C187 Malignant neoplasm of sigmoid colon: Principal | ICD-10-CM

## 2012-09-06 DIAGNOSIS — K7689 Other specified diseases of liver: Secondary | ICD-10-CM

## 2012-09-06 LAB — CBC
MCH: 28.9 pg (ref 26.0–34.0)
MCV: 86.5 fL (ref 78.0–100.0)
Platelets: 195 10*3/uL (ref 150–400)
RDW: 15.4 % (ref 11.5–15.5)

## 2012-09-06 LAB — CLOSTRIDIUM DIFFICILE BY PCR: Toxigenic C. Difficile by PCR: NEGATIVE

## 2012-09-06 MED ORDER — METOPROLOL TARTRATE 25 MG PO TABS
25.0000 mg | ORAL_TABLET | Freq: Two times a day (BID) | ORAL | Status: DC
  Administered 2012-09-06 – 2012-09-07 (×2): 25 mg via ORAL
  Filled 2012-09-06 (×3): qty 1

## 2012-09-06 MED ORDER — FUROSEMIDE 20 MG PO TABS
20.0000 mg | ORAL_TABLET | Freq: Two times a day (BID) | ORAL | Status: DC
  Administered 2012-09-07: 20 mg via ORAL
  Filled 2012-09-06 (×3): qty 1

## 2012-09-06 MED ORDER — METOPROLOL TARTRATE 12.5 MG HALF TABLET
12.5000 mg | ORAL_TABLET | Freq: Two times a day (BID) | ORAL | Status: DC
  Administered 2012-09-06: 12.5 mg via ORAL
  Filled 2012-09-06 (×2): qty 1

## 2012-09-06 MED ORDER — LEVOFLOXACIN IN D5W 750 MG/150ML IV SOLN
750.0000 mg | INTRAVENOUS | Status: DC
  Administered 2012-09-06 – 2012-09-09 (×3): 750 mg via INTRAVENOUS
  Filled 2012-09-06 (×7): qty 150

## 2012-09-06 MED ORDER — FUROSEMIDE 10 MG/ML IJ SOLN
40.0000 mg | Freq: Once | INTRAMUSCULAR | Status: AC
  Administered 2012-09-06: 40 mg via INTRAVENOUS
  Filled 2012-09-06: qty 4

## 2012-09-06 NOTE — Progress Notes (Addendum)
1 Day Post-Op  Subjective: Pt feeling well today.  Tolerating liquids well, small BM's, +flatus, urinating normally.  No N/V.  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  Pt just went to echo and awaiting cardiology final pre-op clearance.  Pt ambulating some.  Wife at bedside.  Objective: Vital signs in last 24 hours: Temp:  [99.2 F (37.3 C)-100.7 F (38.2 C)] 99.2 F (37.3 C) (02/12 0531) Pulse Rate:  [69] 69 (02/11 2100) Resp:  [9-49] 18 (02/12 0531) BP: (96-144)/(41-80) 108/41 mmHg (02/12 0531) SpO2:  [90 %-97 %] 96 % (02/12 0531) Weight:  [223 lb 1.7 oz (101.2 kg)] 223 lb 1.7 oz (101.2 kg) (02/12 0531) Last BM Date: 09/06/12  Intake/Output from previous day: 02/11 0701 - 02/12 0700 In: 360 [P.O.:360] Out: 375 [Urine:375] Intake/Output this shift: Total I/O In: 240 [P.O.:240] Out: -   PE: Gen:  Alert, NAD, pleasant Abd: Soft, minimal tenderness, ND, +BS, no HSM   Lab Results:   Recent Labs  09/04/12 2215 09/06/12 0518  WBC 19.0* 12.5*  HGB 13.6 10.3*  HCT 40.4 30.8*  PLT 246 195   BMET  Recent Labs  09/04/12 2215  NA 139  K 3.8  CL 99  CO2 23  GLUCOSE 219*  BUN 31*  CREATININE 1.10  CALCIUM 10.4   PT/INR No results found for this basename: LABPROT, INR,  in the last 72 hours CMP     Component Value Date/Time   NA 139 09/04/2012 2215   K 3.8 09/04/2012 2215   CL 99 09/04/2012 2215   CO2 23 09/04/2012 2215   GLUCOSE 219* 09/04/2012 2215   BUN 31* 09/04/2012 2215   CREATININE 1.10 09/04/2012 2215   CREATININE 0.76 06/27/2012 0856   CALCIUM 10.4 09/04/2012 2215   PROT 8.0 09/04/2012 2215   ALBUMIN 4.0 09/04/2012 2215   AST 39* 09/04/2012 2215   ALT 28 09/04/2012 2215   ALKPHOS 173* 09/04/2012 2215   BILITOT 0.4 09/04/2012 2215   GFRNONAA 61* 09/04/2012 2215   GFRAA 71* 09/04/2012 2215   Lipase     Component Value Date/Time   LIPASE 104* 09/04/2012 2215       Studies/Results: Ct Abdomen Pelvis W  Contrast  09/05/2012  *RADIOLOGY REPORT*  Clinical Data: Nausea, vomiting, abdominal pain and cough.  Chills.  CT ABDOMEN AND PELVIS WITH CONTRAST  Technique:  Multidetector CT imaging of the abdomen and pelvis was performed following the standard protocol during bolus administration of intravenous contrast.  Contrast:  100 mL of Omnipaque 300 IV contrast  Comparison: Lumbar spine radiographs performed 03/25/2009  Findings: Left basilar airspace opacity may reflect atelectasis or possibly pneumonia.  Diffuse coronary artery calcifications are noted.  Multiple mildly heterogeneous slightly ill-defined masses are seen throughout the right hepatic lobe, the largest of which measures 11.5 cm.  This is most compatible with metastatic disease.  The spleen is unremarkable in appearance.  The pancreas is grossly unremarkable.  Note is made of a somewhat low attenuation node adjacent to the proximal pancreatic body, measuring 2.4 x 1.5 cm, likely reflecting metastatic disease.  There is a 2.6 cm right adrenal mass; this may also reflect metastatic disease.  The left adrenal gland is unremarkable in appearance.  Left renal cysts are noted, measuring up to 10.3 cm in size, with minimal peripheral calcification.  No definite suspicious masses are seen.  Small right renal cysts are also seen.  Mild nonspecific perinephric stranding is noted  bilaterally.  There is no evidence of hydronephrosis.  No renal or ureteral stones are seen.  The small bowel is unremarkable in appearance.  The stomach is within normal limits.  No acute vascular abnormalities are seen. Diffuse calcification is noted along the abdominal aorta and its branches.  The suspected primary malignancy is noted at the mid sigmoid colon, measuring approximately 5.2 x 4.7 x 4.1 cm, with scattered small adjacent nodules reflecting local spread of disease.  There is no evidence of obstruction; no perforation is seen.  Trace associated free fluid is noted.  There is  mild diffuse wall thickening noted involving the proximal sigmoid colon, possibly reflecting chronic inflammation due to the more distal mass.  The remainder of the colon is grossly unremarkable.  The appendix is not definitely seen.  Scattered small pelvic sidewall nodes may remain within normal limits; there is diffuse haziness within the retroperitoneum, without definite retroperitoneal lymphadenopathy.  The bladder is mildly distended and grossly unremarkable; a very large prostate is noted, measuring approximately 7.7 x 6.4 x 7.3 cm.  Minimal associated calcification is seen.  No inguinal lymphadenopathy is seen.  No acute osseous abnormalities are identified.  Multilevel vacuum phenomenon and disc space narrowing are noted along the lumbar spine, with underlying facet disease.  IMPRESSION:  1.  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  No evidence for obstruction; no perforation seen.  Trace associated free fluid noted. 2.  Mild diffuse wall thickening involving the proximal sigmoid colon may reflect chronic inflammation due to the more distal mass; the remaining colon is unremarkable. 3.  Multiple mildly heterogeneous slightly ill-defined masses throughout the right hepatic lobe, measuring up to 11.5 cm, compatible with metastatic disease. 4.  2.4 x 1.5 cm peripancreatic node demonstrates mild low attenuation, likely reflecting metastatic disease. 5.  2.6 cm adrenal mass may reflect metastatic disease, though it is nonspecific in appearance. 6.  Focal left basilar airspace opacification may reflect atelectasis or possibly pneumonia. 7.  Diffuse coronary artery calcifications seen. 8.  Bilateral renal cysts, particularly prominent on the left side. 9.  Diffuse calcification along the abdominal aorta and its branches. 10.  Markedly enlarged prostate noted, measuring 7.7 cm in size. 11.   Diffuse degenerative change noted along the lumbar spine.   Original Report Authenticated By: Tonia Ghent, M.D.     Anti-infectives: Anti-infectives   Start     Dose/Rate Route Frequency Ordered Stop   09/05/12 1200  ciprofloxacin (CIPRO) IVPB 400 mg     400 mg 200 mL/hr over 60 Minutes Intravenous Every 12 hours 09/05/12 0404     09/05/12 1000  metroNIDAZOLE (FLAGYL) IVPB 500 mg     500 mg 100 mL/hr over 60 Minutes Intravenous Every 8 hours 09/05/12 0404     09/05/12 0115  ciprofloxacin (CIPRO) IVPB 400 mg     400 mg 200 mL/hr over 60 Minutes Intravenous  Once 09/05/12 0101 09/05/12 0256   09/05/12 0115  metroNIDAZOLE (FLAGYL) IVPB 500 mg     500 mg 100 mL/hr over 60 Minutes Intravenous  Once 09/05/12 0101 09/05/12 0225       Assessment/Plan 1.  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 2.  Mets to the liver.  Plan: 1.  The patient will need sigmoid colectomy and colostomy on Thursday or Friday given good bowel prep can not be accomplished 2.  Would like  medical oncology's input regarding placing a porta-cath for chemotherapy during the colon operation, which Dr. Ezzard Standing would be happy to do 3.  NPO after midnight, consent form for possible surgery tomorrow, don't see that the patient is on a blood thinners, but those would need to be held as well 4.  Cardiology pre-op clearance consult appreciated, will await final recommendation 5.  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  Near obstructing tumor at 35 cm - biopsy - adenocarcinoma.  I reviewed with the patient the findings and need for colon surgery.  I discussed both surgical and non surgical options.   I reviewed the risks of surgery, including, but not limited to, infection, bleeding, nerve injury, anastomotic leaks, and possibility of colostomy.  The patient has  metastatic disease, so this is a palliative procedure.  It is unlikely that I can do much laparoscopically.  Will plan resection of primary, since it is near obstructing.  Also will possibly plan porta cath.  Seen by Dr. Earl Gala.  The risks include bleeding, infection, pneumothorax, and thrombosis.  I don't see cardiology's final clearance note - surgery possibly tomorrow or Friday.  Wife in room with patient.  They are undecided about chemotx, and therefore, undecided about the porta cath.  I tried to call daughter, Danielle Rankin, cell: [phone], home: [phone].  Left message.  Ovidio Kin, MD, [facility] Pager: [phone] Office phone:  [phone]

## 2012-09-06 NOTE — Consult Note (Signed)
Reason for Consult: Pre Op clearance  Requesting Physician: Dr Janee Morn  HPI: This is a 79 y.o. male with a past medical history significant for remote Ant MI with a late cath showing a total LAD with collaterals. His EF has been 30%. His last Myoview was March 2012. He has declined an ICD in the past. From a cardiac standpoint he has been stable. He denies any anginal symptoms. He has had intermittent edema but no CHF. He was admitted 09/04/12 with nause and vomiting. CT of his abdomin suggest a possible colonic malignancy. He is tentatively scheduled for surgery 09/07/12.   PMHx:  Past Medical History  Diagnosis Date  . Myocardial infarction 1990    "Massive"  . Gout   . Hypertension   . Arthritis, rheumatoid   . Diabetes mellitus without complication   . CHF (congestive heart failure)   . BPH (benign prostatic hyperplasia)   . Neuropathy   . Hyperlipidemia    Past Surgical History  Procedure Laterality Date  . Cataract extraction      FAMHx: Family History  Problem Relation Age of Onset  . Stroke Father   . Diabetes Father   . Hypertension Father   . Heart attack Father   . Hyperlipidemia Neg Hx   . Sudden death Neg Hx     SOCHx:  reports that he has quit smoking. He has never used smokeless tobacco. He reports that  drinks alcohol. 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   ALLERGIES: No Known Allergies  ROS: Pertinent items are noted in HPI.  HOME MEDICATIONS: Prescriptions prior to admission  Medication Sig Dispense Refill  . aspirin 81 MG tablet Take 81 mg by mouth daily.        . colchicine 0.6 MG tablet Take 0.6 mg by mouth daily as needed (for gout flares).      . diclofenac (VOLTAREN) 75 MG EC tablet Take 75 mg by mouth 2 (two) times daily.      Marland Kitchen doxazosin (CARDURA) 8 MG tablet Take 8 mg by mouth daily.       . felodipine (PLENDIL) 5 MG 24 hr tablet Take 5 mg by mouth daily.      Marland Kitchen  gabapentin (NEURONTIN) 300 MG capsule Take 1 capsule (300 mg total) by mouth 3 (three) times daily.  270 capsule  3  . hydrochlorothiazide (HYDRODIURIL) 25 MG tablet Take 1 tablet (25 mg total) by mouth daily.  90 tablet  3  . isosorbide mononitrate (IMDUR) 60 MG 24 hr tablet Take 1 tablet (60 mg total) by mouth daily.  90 tablet  3  . loratadine (CLARITIN) 10 MG tablet Take 10 mg by mouth daily.        . metoprolol succinate (TOPROL-XL) 50 MG 24 hr tablet Take 50 mg by mouth daily. Take with or immediately following a meal.      . Multiple Vitamin (MULTIVITAMIN) tablet Take 1 tablet by mouth daily.        . nitroGLYCERIN (NITROSTAT) 0.4 MG SL tablet Place 0.4 mg under the tongue every 5 (five) minutes as needed.        . Omega-3 Fatty Acids (FISH OIL DOUBLE STRENGTH) 1200 MG CAPS Take 1 capsule by mouth 2 (two) times daily.        . psyllium (METAMUCIL) 58.6 % powder Take 1 packet by mouth 2 (two) times daily.      . simvastatin (  ZOCOR) 40 MG tablet Take 1 tablet (40 mg total) by mouth at bedtime.  90 tablet  3  . [DISCONTINUED] benazepril (LOTENSIN) 40 MG tablet Take 1 tablet (40 mg total) by mouth daily.  90 tablet  3  . zoster vaccine live, PF, (ZOSTAVAX) 45409 UNT/0.65ML injection Inject into the skin once. Dispense to patient to be admin in doctors office         HOSPITAL MEDICATIONS: I have reviewed the patient's current medications.  VITALS: Blood pressure 108/41, pulse 69, temperature 99.2 F (37.3 C), temperature source Oral, resp. rate 18, height 5\' 10"  (1.778 m), weight 101.2 kg (223 lb 1.7 oz), SpO2 96.00%.  PHYSICAL EXAM: General appearance: alert, cooperative, no distress and mildly obese Neck: no carotid bruit, no JVD and transmitted murmur Lungs: clear to auscultation bilaterally Heart: regular rate and rhythm and 2/6 systolic murmur at Aov and LSB Abdomen: obese, BS present Extremities: trace edema Pulses: 2+ and symmetric Skin: cool and dry Neurologic: Grossly  normal  LABS: Results for orders placed during the hospital encounter of 09/04/12 (from the past 48 hour(s))  COMPREHENSIVE METABOLIC PANEL     Status: Abnormal   Collection Time    09/04/12 10:15 PM      Result Value Range   Sodium 139  135 - 145 mEq/L   Potassium 3.8  3.5 - 5.1 mEq/L   Chloride 99  96 - 112 mEq/L   CO2 23  19 - 32 mEq/L   Glucose, Bld 219 (*) 70 - 99 mg/dL   BUN 31 (*) 6 - 23 mg/dL   Creatinine, Ser 8.11  0.50 - 1.35 mg/dL   Calcium 91.4  8.4 - 78.2 mg/dL   Total Protein 8.0  6.0 - 8.3 g/dL   Albumin 4.0  3.5 - 5.2 g/dL   AST 39 (*) 0 - 37 U/L   ALT 28  0 - 53 U/L   Alkaline Phosphatase 173 (*) 39 - 117 U/L   Total Bilirubin 0.4  0.3 - 1.2 mg/dL   GFR calc non Af Amer 61 (*) >90 mL/min   GFR calc Af Amer 71 (*) >90 mL/min   Comment:            The eGFR has been calculated     using the CKD EPI equation.     This calculation has not been     validated in all clinical     situations.     eGFR's persistently     <90 mL/min signify     possible Chronic Kidney Disease.  CBC WITH DIFFERENTIAL     Status: Abnormal   Collection Time    09/04/12 10:15 PM      Result Value Range   WBC 19.0 (*) 4.0 - 10.5 K/uL   RBC 4.66  4.22 - 5.81 MIL/uL   Hemoglobin 13.6  13.0 - 17.0 g/dL   HCT 95.6  21.3 - 08.6 %   MCV 86.7  78.0 - 100.0 fL   MCH 29.2  26.0 - 34.0 pg   MCHC 33.7  30.0 - 36.0 g/dL   RDW 57.8  46.9 - 62.9 %   Platelets 246  150 - 400 K/uL   Neutrophils Relative 85 (*) 43 - 77 %   Neutro Abs 16.2 (*) 1.7 - 7.7 K/uL   Lymphocytes Relative 11 (*) 12 - 46 %   Lymphs Abs 2.2  0.7 - 4.0 K/uL   Monocytes Relative 2 (*) 3 - 12 %  Monocytes Absolute 0.4  0.1 - 1.0 K/uL   Eosinophils Relative 1  0 - 5 %   Eosinophils Absolute 0.2  0.0 - 0.7 K/uL   Basophils Relative 0  0 - 1 %   Basophils Absolute 0.0  0.0 - 0.1 K/uL  LIPASE, BLOOD     Status: Abnormal   Collection Time    09/04/12 10:15 PM      Result Value Range   Lipase 104 (*) 11 - 59 U/L  LACTIC  ACID, PLASMA     Status: Abnormal   Collection Time    09/04/12 10:15 PM      Result Value Range   Lactic Acid, Venous 3.3 (*) 0.5 - 2.2 mmol/L  TROPONIN I     Status: None   Collection Time    09/04/12 10:15 PM      Result Value Range   Troponin I <0.30  <0.30 ng/mL   Comment:            Due to the release kinetics of cTnI,     a negative result within the first hours     of the onset of symptoms does not rule out     myocardial infarction with certainty.     If myocardial infarction is still suspected,     repeat the test at appropriate intervals.  STOOL CULTURE     Status: None   Collection Time    09/04/12 11:30 PM      Result Value Range   Specimen Description STOOL     Special Requests Normal     Culture Culture reincubated for better growth     Report Status PENDING    GLUCOSE, CAPILLARY     Status: Abnormal   Collection Time    09/05/12  5:45 AM      Result Value Range   Glucose-Capillary 160 (*) 70 - 99 mg/dL  HEMOGLOBIN Z6X     Status: Abnormal   Collection Time    09/05/12  6:55 AM      Result Value Range   Hemoglobin A1C 6.2 (*) <5.7 %   Comment: (NOTE)                                                                               According to the ADA Clinical Practice Recommendations for 2011, when     HbA1c is used as a screening test:      >=6.5%   Diagnostic of Diabetes Mellitus               (if abnormal result is confirmed)     5.7-6.4%   Increased risk of developing Diabetes Mellitus     References:Diagnosis and Classification of Diabetes Mellitus,Diabetes     Care,2011,34(Suppl 1):S62-S69 and Standards of Medical Care in             Diabetes - 2011,Diabetes Care,2011,34 (Suppl 1):S11-S61.   Mean Plasma Glucose 131 (*) <117 mg/dL  GLUCOSE, CAPILLARY     Status: Abnormal   Collection Time    09/05/12  8:26 AM      Result Value Range   Glucose-Capillary 162 (*) 70 - 99 mg/dL   Comment 1  Notify RN    URINALYSIS, ROUTINE W REFLEX MICROSCOPIC      Status: Abnormal   Collection Time    09/05/12  8:37 AM      Result Value Range   Color, Urine AMBER (*) YELLOW   Comment: BIOCHEMICALS MAY BE AFFECTED BY COLOR   APPearance CLEAR  CLEAR   Specific Gravity, Urine 1.010  1.005 - 1.030   pH 5.0  5.0 - 8.0   Glucose, UA NEGATIVE  NEGATIVE mg/dL   Hgb urine dipstick NEGATIVE  NEGATIVE   Bilirubin Urine NEGATIVE  NEGATIVE   Ketones, ur 15 (*) NEGATIVE mg/dL   Protein, ur NEGATIVE  NEGATIVE mg/dL   Urobilinogen, UA 0.2  0.0 - 1.0 mg/dL   Nitrite POSITIVE (*) NEGATIVE   Leukocytes, UA NEGATIVE  NEGATIVE  URINE MICROSCOPIC-ADD ON     Status: Abnormal   Collection Time    09/05/12  8:37 AM      Result Value Range   WBC, UA 0-2  <3 WBC/hpf   RBC / HPF 0-2  <3 RBC/hpf   Bacteria, UA RARE  RARE   Casts HYALINE CASTS (*) NEGATIVE  GLUCOSE, CAPILLARY     Status: Abnormal   Collection Time    09/05/12 11:48 AM      Result Value Range   Glucose-Capillary 143 (*) 70 - 99 mg/dL   Comment 1 Notify RN    GLUCOSE, CAPILLARY     Status: Abnormal   Collection Time    09/05/12  4:30 PM      Result Value Range   Glucose-Capillary 130 (*) 70 - 99 mg/dL   Comment 1 Notify RN    GLUCOSE, CAPILLARY     Status: Abnormal   Collection Time    09/05/12  8:29 PM      Result Value Range   Glucose-Capillary 163 (*) 70 - 99 mg/dL  GLUCOSE, CAPILLARY     Status: Abnormal   Collection Time    09/06/12  5:02 AM      Result Value Range   Glucose-Capillary 126 (*) 70 - 99 mg/dL  CBC     Status: Abnormal   Collection Time    09/06/12  5:18 AM      Result Value Range   WBC 12.5 (*) 4.0 - 10.5 K/uL   RBC 3.56 (*) 4.22 - 5.81 MIL/uL   Hemoglobin 10.3 (*) 13.0 - 17.0 g/dL   HCT 16.1 (*) 09.6 - 04.5 %   MCV 86.5  78.0 - 100.0 fL   MCH 28.9  26.0 - 34.0 pg   MCHC 33.4  30.0 - 36.0 g/dL   RDW 40.9  81.1 - 91.4 %   Platelets 195  150 - 400 K/uL  CLOSTRIDIUM DIFFICILE BY PCR     Status: None   Collection Time    09/06/12  5:55 AM      Result Value Range    C difficile by pcr NEGATIVE  NEGATIVE    IMAGING: Ct Abdomen Pelvis W Contrast  09/05/2012  *RADIOLOGY REPORT*  Clinical Data: Nausea, vomiting, abdominal pain and cough.  Chills.  CT ABDOMEN AND PELVIS WITH CONTRAST    IMPRESSION:  1.  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  No evidence for obstruction; no perforation seen.  Trace associated free fluid noted. 2.  Mild diffuse wall thickening involving the proximal sigmoid colon may  reflect chronic inflammation due to the more distal mass; the remaining colon is unremarkable. 3.  Multiple mildly heterogeneous slightly ill-defined masses throughout the right hepatic lobe, measuring up to 11.5 cm, compatible with metastatic disease. 4.  2.4 x 1.5 cm peripancreatic node demonstrates mild low attenuation, likely reflecting metastatic disease. 5.  2.6 cm adrenal mass may reflect metastatic disease, though it is nonspecific in appearance. 6.  Focal left basilar airspace opacification may reflect atelectasis or possibly pneumonia. 7.  Diffuse coronary artery calcifications seen. 8.  Bilateral renal cysts, particularly prominent on the left side. 9.  Diffuse calcification along the abdominal aorta and its branches. 10.  Markedly enlarged prostate noted, measuring 7.7 cm in size. 11.  Diffuse degenerative change noted along the lumbar spine.   Original Report Authenticated By: Tonia Ghent, M.D.    EKG- NSR, old Ant MI. PVCs  IMPRESSION:  Principal Problem:   Nausea with vomiting and diarrhea  Active Problems:   Colonic mass   Encounter for pre-operative cardiovascular clearance   Systolic murmur    Ischemic cardiomyopathy, EF 30% Myoview 3/12   CAD, Ant MI 1990- total LAD with collaterals, Myoview low risk 3/12    DIABETES MELLITUS, CONTROLLED, WITHOUT COMPLICATIONS   HYPERLIPIDEMIA   Gout, unspecified   OBESITY, NOS    HYPERTENSION, BENIGN SYSTEMIC   HYPERTENSION   Elevated lactic acid level   Elevated lipase   Leukocytosis, unspecified   Prostate enlargement   RECOMMENDATION: Dr Rennis Golden to see. Will order 2D echo. We will follow with you. Continue Nitrates, add low dose beta blocker. Consider adding ACE at some point.  Time Spent Directly with Patient:  40 minutes  Tylia Ewell K 09/06/2012, 9:29 AM

## 2012-09-06 NOTE — Progress Notes (Signed)
Pt compliant with allowing CBG checks but refuses insulin.

## 2012-09-06 NOTE — Consult Note (Signed)
Reason for Referral: Colon and Liver mas            HPI: Patient is a very pleasant 79 year old gentleman native of [address] but currently lives in [place]. 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 He was hospitalized on 2/10 after he presented with ntermittent abdominal pain over the past several weeks. He also had some constipation which he thought might be due to his prostate medication. He added Metamucil to his diet. That improved. He has had no hematochezia. He is never had a colonoscopy prior to this admission.The day prior to admission, he developed significant nausea and vomiting. He was admitted to family medicine teaching service. Workup has included CT scan of the abdomen and pelvis. This shows a sigmoid colon mass as well as evidence of multiple right-sided liver tumors very suspicious for metastatic colon cancer. There are also some enlarged abdominal lymph nodes. He underwent flexible sigmoidoscopy today by Dr. Dorena Cookey from [facility] gastroenterology. He was found to have a nearly obstructing mass in the sigmoid colon at 35 cm. The scope was not able to be passed beyond the mass. Multiple biopsies were taken and these are pending.  He is feeling little better, but still very weak. Not eating much at this time.    Past Medical History  Diagnosis Date  . Myocardial infarction 1990    "Massive"  . Gout   . Hypertension   . Arthritis, rheumatoid   . Diabetes mellitus without complication   . CHF (congestive heart failure)   . BPH (benign prostatic hyperplasia)   . Neuropathy   . Hyperlipidemia   :  Past Surgical History  Procedure Laterality Date  . Cataract extraction    . Flexible sigmoidoscopy N/A 09/05/2012    Procedure: FLEXIBLE SIGMOIDOSCOPY;  Surgeon: Barrie Folk, MD;  Location: [facility] ENDOSCOPY;  Service: Endoscopy;  Laterality: N/A;  Give 1000 cc tapwater enema on call to endoscopy.  :  Current facility-administered  medications:0.9 %  sodium chloride infusion, , Intravenous, Continuous, Maryruth Bun Rama, MD, Last Rate: 100 mL/hr at 09/06/12 0719;  ciprofloxacin (CIPRO) IVPB 400 mg, 400 mg, Intravenous, Q12H, Maryruth Bun Rama, MD, 400 mg at 09/06/12 0016;  doxazosin (CARDURA) tablet 8 mg, 8 mg, Oral, Daily, Eda Paschal Drexel, [place], 8 mg at 09/06/12 0920 gabapentin (NEURONTIN) capsule 300 mg, 300 mg, Oral, TID, Andrena Mews, DO, 300 mg at 09/06/12 0920;  insulin aspart (novoLOG) injection 0-9 Units, 0-9 Units, Subcutaneous, Q4H, Christina P Rama, MD;  isosorbide mononitrate (IMDUR) 24 hr tablet 60 mg, 60 mg, Oral, Daily, Andrena Mews, DO, 60 mg at 09/06/12 0920;  loratadine (CLARITIN) tablet 10 mg, 10 mg, Oral, Daily PRN, Andrena Mews, DO metoprolol tartrate (LOPRESSOR) tablet 12.5 mg, 12.5 mg, Oral, BID, Eda Paschal Justin, PA, 12.5 mg at 09/06/12 1126;  metroNIDAZOLE (FLAGYL) IVPB 500 mg, 500 mg, Intravenous, Q8H, Christina P Rama, MD, 500 mg at 09/06/12 0920;  morphine 4 MG/ML injection 4 mg, 4 mg, Intravenous, Q2H PRN, Dione Booze, MD, 4 mg at 09/05/12 2028;  nitroGLYCERIN (NITROSTAT) SL tablet 0.4 mg, 0.4 mg, Sublingual, Q5 min PRN, Maryruth Bun Rama, MD psyllium (HYDROCIL/METAMUCIL) packet 1 packet, 1 packet, Oral, BID, Andrena Mews, DO, 1 packet at 09/05/12 2106;  sodium chloride 0.9 % injection 3 mL, 3 mL, Intravenous, Q12H, Christina P Rama, MD, 3 mL at 09/05/12 2200;  traMADol (ULTRAM) tablet 50 mg, 50 mg, Oral, Q6H  PRN, Andrena Mews, DO:  . ciprofloxacin  400 mg Intravenous Q12H  . doxazosin  8 mg Oral Daily  . gabapentin  300 mg Oral TID  . insulin aspart  0-9 Units Subcutaneous Q4H  . isosorbide mononitrate  60 mg Oral Daily  . metoprolol tartrate  12.5 mg Oral BID  . metronidazole  500 mg Intravenous Q8H  . psyllium  1 packet Oral BID  . sodium chloride  3 mL Intravenous Q12H  :  No Known Allergies:  Family History  Problem Relation Age of Onset  . Stroke Father   . Diabetes Father   .  Hypertension Father   . Heart attack Father   . Hyperlipidemia Neg Hx   . Sudden death Neg Hx   :  History   Social History  . Marital Status: Married    Spouse Name: N/A    Number of Children: 2  . Years of Education: N/A   Occupational History  . Retired, food and nutritional services    Social History Main Topics  . Smoking status: Former Games developer  . Smokeless tobacco: Never Used     Comment: Quit 45 years ago.  . Alcohol Use: Yes     Comment: 1 drink a month  . Drug Use: No  . Sexually Active: Not Currently   Other Topics Concern  . Not on file   Social History Narrative   Emergency Contact:wife, Ezrael Sam at [phone]   Health Care POA:   Who lives with you: Lives with wife in 1 story home.   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  Has a varied diet and is currently trying to lose weight by decreasing protein and carbohydrates daily.   Exercise: water aerobix 3x a week for 45 mins and uses row bike 2 times a week for 20 minutes   Seatbelts: wears seatbelt while in vehicles   Sun Exposure/Protection: Patient reports wearing sun screen   Hobbies: numbers, cruises           :  A comprehensive review of systems was negative.  Exam: Patient Vitals for the past 24 hrs:  BP Temp Temp src Pulse Resp SpO2 Weight  09/06/12 0531 108/41 mmHg 99.2 F (37.3 C) Oral - 18 96 % 223 lb 1.7 oz (101.2 kg)  09/05/12 2100 96/43 mmHg 100.7 F (38.2 C) Oral 69 18 97 % -  09/05/12 1500 133/80 mmHg - - - 23 93 % -  09/05/12 1450 144/72 mmHg - - - 24 92 % -  09/05/12 1440 126/58 mmHg - - - 15 92 % -  09/05/12 1433 - 99.2 F (37.3 C) Oral 69 15 - -  09/05/12 1430 124/59 mmHg - - - 19 92 % -  09/05/12 1425 120/62 mmHg - - - 14 90 % -  09/05/12 1420 129/59 mmHg - - - 23 94 % -  09/05/12 1415 140/67 mmHg - - - 9 95 % -  09/05/12 1410 126/67 mmHg - - - 49 96 % -  09/05/12 1324 - 99.3 F (37.4 C) - - 26 - -    General appearance: alert and cooperative Head: Normocephalic, without obvious abnormality, atraumatic Throat: lips, mucosa, and tongue normal; teeth and gums normal Neck: no adenopathy, no carotid bruit, no JVD, supple, symmetrical, trachea midline and thyroid not enlarged, symmetric, no tenderness/mass/nodules Resp: clear to auscultation bilaterally Chest wall: no tenderness Cardio: regular  rate and rhythm, S1, S2 normal, no murmur, click, rub or gallop GI: soft, non-tender; bowel sounds normal; no masses,  no organomegaly Extremities: extremities normal, atraumatic, no cyanosis or edema   Recent Labs  09/04/12 2215 09/06/12 0518  WBC 19.0* 12.5*  HGB 13.6 10.3*  HCT 40.4 30.8*  PLT 246 195    Recent Labs  09/04/12 2215  NA 139  K 3.8  CL 99  CO2 23  GLUCOSE 219*  BUN 31*  CREATININE 1.10  CALCIUM 10.4   Lab Results  Component Value Date   WBC 12.5* 09/06/2012   HGB 10.3* 09/06/2012   HCT 30.8* 09/06/2012   MCV 86.5 09/06/2012   PLT 195 09/06/2012    Recent Labs Lab 09/04/12 2215  NA 139  K 3.8  CL 99  CO2 23  BUN 31*  CREATININE 1.10  CALCIUM 10.4  PROT 8.0  BILITOT 0.4  ALKPHOS 173*  ALT 28  AST 39*  GLUCOSE 219*      Ct Abdomen Pelvis W Contrast  09/05/2012  *RADIOLOGY REPORT*  Clinical Data: Nausea, vomiting, abdominal pain and cough.  Chills.  CT ABDOMEN AND PELVIS WITH CONTRAST  Technique:  Multidetector CT imaging of the abdomen and pelvis was performed following the standard protocol during bolus administration of intravenous contrast.  Contrast:  100 mL of Omnipaque 300 IV contrast  Comparison: Lumbar spine radiographs performed 03/25/2009  Findings: Left basilar airspace opacity may reflect atelectasis or possibly pneumonia.  Diffuse coronary artery calcifications are noted.  Multiple mildly heterogeneous slightly ill-defined masses are seen throughout the right hepatic lobe, the largest of which measures 11.5 cm.  This is most compatible with  metastatic disease.  The spleen is unremarkable in appearance.  The pancreas is grossly unremarkable.  Note is made of a somewhat low attenuation node adjacent to the proximal pancreatic body, measuring 2.4 x 1.5 cm, likely reflecting metastatic disease.  There is a 2.6 cm right adrenal mass; this may also reflect metastatic disease.  The left adrenal gland is unremarkable in appearance.  Left renal cysts are noted, measuring up to 10.3 cm in size, with minimal peripheral calcification.  No definite suspicious masses are seen.  Small right renal cysts are also seen.  Mild nonspecific perinephric stranding is noted bilaterally.  There is no evidence of hydronephrosis.  No renal or ureteral stones are seen.  The small bowel is unremarkable in appearance.  The stomach is within normal limits.  No acute vascular abnormalities are seen. Diffuse calcification is noted along the abdominal aorta and its branches.  The suspected primary malignancy is noted at the mid sigmoid colon, measuring approximately 5.2 x 4.7 x 4.1 cm, with scattered small adjacent nodules reflecting local spread of disease.  There is no evidence of obstruction; no perforation is seen.  Trace associated free fluid is noted.  There is mild diffuse wall thickening noted involving the proximal sigmoid colon, possibly reflecting chronic inflammation due to the more distal mass.  The remainder of the colon is grossly unremarkable.  The appendix is not definitely seen.  Scattered small pelvic sidewall nodes may remain within normal limits; there is diffuse haziness within the retroperitoneum, without definite retroperitoneal lymphadenopathy.  The bladder is mildly distended and grossly unremarkable; a very large prostate is noted, measuring approximately 7.7 x 6.4 x 7.3 cm.  Minimal associated calcification is seen.  No inguinal lymphadenopathy is seen.  No acute osseous abnormalities are identified.  Multilevel vacuum phenomenon and disc space narrowing are  noted along the lumbar spine, with underlying facet disease.  IMPRESSION:  1.  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  No evidence for obstruction; no perforation seen.  Trace associated free fluid noted. 2.  Mild diffuse wall thickening involving the proximal sigmoid colon may reflect chronic inflammation due to the more distal mass; the remaining colon is unremarkable. 3.  Multiple mildly heterogeneous slightly ill-defined masses throughout the right hepatic lobe, measuring up to 11.5 cm, compatible with metastatic disease. 4.  2.4 x 1.5 cm peripancreatic node demonstrates mild low attenuation, likely reflecting metastatic disease. 5.  2.6 cm adrenal mass may reflect metastatic disease, though it is nonspecific in appearance. 6.  Focal left basilar airspace opacification may reflect atelectasis or possibly pneumonia. 7.  Diffuse coronary artery calcifications seen. 8.  Bilateral renal cysts, particularly prominent on the left side. 9.  Diffuse calcification along the abdominal aorta and its branches. 10.  Markedly enlarged prostate noted, measuring 7.7 cm in size. 11.  Diffuse degenerative change noted along the lumbar spine.   Original Report Authenticated By: Tonia Ghent, M.D.     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 He has a large sigmoid mass as well as liver lesions.  The CT scan was reviewed with the patient and his wife, and treatment options were discussed.  He will need IV chemotherapy to palliate his cancer and possible future symptoms and I think he is a reasonable candidate for that.  However, despite his large liver mets, I think he is at a high risk of colonic obstruction and a lot of morbidity with that.  I do not see chemotherapy helping that in the near  future.   I would recommend:  Obtain prop CEA  if not already done.  I agree with proceeding with surgical resection upfront.  If possible, would recommend port placement for chemotherapy at this time.  Upon discharge, I will arrange follow [facility] at [facility] for follow up or his pathology and evaluation for possible chemotherapy.  Please call with questions.

## 2012-09-06 NOTE — Progress Notes (Signed)
Brief Nutrition Note:  RD consulted per pt request. Pt with questions about current diet, choices, and ability to order vs house tray. RD answered all pt questions. RD spoke with [facility] and Occupational hygienist about pt's needs/wants.   No nutrition interventions warranted at this time. Please re-consult as needed.    Clarene Duke RD, LDN Pager [phone] After Hours pager [phone]

## 2012-09-06 NOTE — Progress Notes (Signed)
Family Medicine Teaching Service Daily Progress Note Service Page: [phone]  Subjective:  Feeling well today, still having abd pain, mostly RLQ. Bloody stool continued but smaller in general. Nausea and pain controlled. Wants surgery.   Objective: Temp:  [99.2 F (37.3 C)-100.7 F (38.2 C)] 99.2 F (37.3 C) (02/12 0531) Pulse Rate:  [69] 69 (02/11 2100) Resp:  [9-49] 18 (02/12 0531) BP: (96-144)/(41-80) 108/41 mmHg (02/12 0531) SpO2:  [90 %-97 %] 96 % (02/12 0531) Weight:  [223 lb 1.7 oz (101.2 kg)] 223 lb 1.7 oz (101.2 kg) (02/12 0531) Exam: Gen: NAD, alert, cooperative with exam HEENT: NCAT, EOMI, PERRL CV: RRR, 2-3/6 systolic murmur Resp: CTABL, no wheezes, non-labored Abd: slightly tense, tender to palpation throughout.  Ext: 1+ edema, warm Neuro: Alert and oriented, No gross deficits  I have reviewed the patient's medications, labs, imaging, and diagnostic testing.  Notable results are summarized below.  CBC BMET   Recent Labs Lab 09/04/12 2215 09/06/12 0518  WBC 19.0* 12.5*  HGB 13.6 10.3*  HCT 40.4 30.8*  PLT 246 195    Recent Labs Lab 09/04/12 2215  NA 139  K 3.8  CL 99  CO2 23  BUN 31*  CREATININE 1.10  GLUCOSE 219*  CALCIUM 10.4     Imaging/Diagnostic Tests: CT abd/Pelvis with 09/05/2012 IMPRESSION:  1. Suspect primary colonic malignancy at the mid sigmoid colon,  measuring 5.2 x 4.7 x 4.1 cm, with scattered small adjacent nodules  reflecting local spread of disease. No evidence for obstruction;  no perforation seen. Trace associated free fluid noted.  2. Mild diffuse wall thickening involving the proximal sigmoid  colon may reflect chronic inflammation due to the more distal mass;  the remaining colon is unremarkable.  3. Multiple mildly heterogeneous slightly ill-defined masses  throughout the right hepatic lobe, measuring up to 11.5 cm,  compatible with metastatic disease.  4. 2.4 x 1.5 cm peripancreatic node demonstrates mild low   attenuation, likely reflecting metastatic disease.  5. 2.6 cm adrenal mass may reflect metastatic disease, though it  is nonspecific in appearance.  6. Focal left basilar airspace opacification may reflect  atelectasis or possibly pneumonia.  7. Diffuse coronary artery calcifications seen.  8. Bilateral renal cysts, particularly prominent on the left side.  9. Diffuse calcification along the abdominal aorta and its  branches.  10. Markedly enlarged prostate noted, measuring 7.7 cm in size.  11. Diffuse degenerative change noted along the lumbar spine.   Plan: 79 y/o male here with acute onset nausea, vomiting, and diarrhea, found to have colonic mass on CT scan.   Colonic mass - Likely metastsatic GI Ca - Continue flagyl and cipro for now with fever.  - GI Consulting - appprecuiate reccs  - Flex sig on 09/05/2012- circumferential mass nearly obstructing the colon 35 cm in  - Tissue pathology pending  - Cdiff negative, Stool culture pending - Surgery consulting - appreciate reccs - plan OR possibly on 2/13 - Oncology consulted this am, appreciate reccs.  - Ultram and morphine for pain.   Nausea, vomiting and diarrhea - Improved since admission, likely related to mass/mets  - PRN zofran  Fever  - Last night to 100.7,  - possibly Ca related, Will work up - Previous UA with + nitrites, repeat and culture, blood culture - continue cipro flagyl for now as WBC is trending down   CAD, CHF, Hx of MI -Cardiology consulting- appreciate reccs.   - restart IMDUR, adding lopressor 12.5 BID - ordering TTE  to eval before surgery - Hold ASA and statin  HTN - BP: 96-144/43-72, currently 108/41, 96/43 last night, HR in the 60s - Hold home: HCTZ, benzapril - IMDUR, BB started/addded per cards  HLD - hold simvastatin  DM2 - CBGs: 126-162 - A1C 6.2 - SSI, diet controlled at home  Leukocytosis - WBC 19.0--> 12.5 - Likely inflammation related, will follow  BPH - Continue  doxazosin  FEN/GI: NS at 100 cc/hr, liquids, will advance as tolerated PPx: SCDs Dispo: Home after surgical intervention Code Status: DNR  Kevin Fenton, MD 09/06/2012, 7:05 AM

## 2012-09-06 NOTE — Progress Notes (Signed)
Pt temp is 100.7 oral, Dr. Jennette Kettle is aware chest xray ordered.

## 2012-09-06 NOTE — Progress Notes (Signed)
Attending/Primary Physician Note I saw and examined Todd [name] this morning, have discussed the plan with Dr. Ermalinda Memos and I agree with his plan for today as reflected in his progress note. Todd [name] is feeling much better, slept well overnight and with adequate pain control.  Plan for cardiology preoperative assessment before OR and possibly colostomy.  For oncology consult to discuss treatment options.  Todd [name] describes himself as a "fatalist", says he is in good humor given the circumstances. Paula Compton, MD

## 2012-09-06 NOTE — Progress Notes (Signed)
Discussed with Dr. Janee Morn likely plans for surgery on 2/13.  Would appreciate Cardiology evaluation for pre-operative clearance.  Discussed with Dr. Herbie Baltimore with [facility].  Andrena Mews, DO [facility] Resident - PGY-2 09/06/2012 6:33 AM

## 2012-09-06 NOTE — Progress Notes (Signed)
  Echocardiogram 2D Echocardiogram has been performed.  Todd [name] 09/06/2012, 11:07 AM

## 2012-09-06 NOTE — Progress Notes (Signed)
FMTS Attending Daily Note: Todd Jacuinde MD [phone] pager office [phone] I  have seen and examined this patient, reviewed their chart. I have discussed this patient with the resident. I agree with the resident's findings, assessment and care plan. 

## 2012-09-06 NOTE — Progress Notes (Signed)
Order placed for one time dose of lasix 40 IV, called MD concerning pt getting NS at 100 ml/hr.  Order given to change fluids to [facility]  Will carry out MD orders and continue to monitor.

## 2012-09-06 NOTE — Consult Note (Signed)
I have seen and evaluated the patient this PM along with Corine Shelter, PA. I agree with his findings, examination as well as impression recommendations.  79 y/o man with distant h/o anterior MI with 100% occluded LAD & associated Ischemic Cardiomyopathy EF ~35% with chronic mild dependent edema (but not on Lasix) -- followed by Dr. Clarene Duke until his retirement & has as of yet not seen Dr. Rennis Golden.  He has continuously refused discussion of ICD.  He has not had any notable CHF or angina in the past several months -- he exercises regularly riding a stationary bicycle & doing water aerobics.  He actually pre-medicates with SL NTG prior to exercising, but has only felt like he needed another dose ~3-4 times in the past year.  He also denies any PND or orthopnea. His last Myoview was in March 2012 -- anteroapical infarct with no significant ischemia.  Unfortunately, he was admitted on 09/04/12 with GI Sx of N/V & abdominal discomfort.  CT scan demonstrated a colonic mass concerning for malignancy.  He is being considered for relatively urgent surgery, that I think takes precedence over his other concerns.  His exam is notable for LE edema -- that he says has picked up since he started taking metamucil, mild basal rales while lying down & a known Aortic Sclerosis murmur.  Echocardiogram - revealed EF 35-40% with LAD distribution WMA & scar as previously described.  He does have evidence of elevated LV filling pressures.  Overall, Mr. Goodlin is has been relatively stable from a cardiac standpoint since his MI.  I also think that his upcoming surgery is not elective & quite urgent given his progressive symptoms.    ACC/AHA Pre-Op guidelines:  Non-emergent, but urgent surgery  No true active cardiac symptoms -- relatively stable / compensated CHF (Class II)  High Risk Intraperitoneal Surgery  Pre-morbid functional capacity of >> 4 METS   Recommendations are to proceed with planned surgery without  additional Non-invasive evaluation.  Revised Cardiac Risk Index (by Modesta Messing.): High Risk (> 10-11%) for Adverse Outcome -- MI, PE, VF/VT (cardiac arrest), Complete HB,  High Risk Intraperitoneal Surgery: Yes  CAD - non-revascularized: Yes  Echo with low EF & mild rales/edema: Yes  Cerebrovascular Disease: - No  DM- on insulin: No  Renal insufficiency (Cr >2.0): No  Risk reduced by ~3%-6% by long-term stable BB usage.  Recommendations:    Would proceed with planned surgery without any further evaluation that would only potentially delay his needed surgery.  I have discussed his pre-operative risk.  As he does have some elevated LVEDP, would treat with at least 1 dose of IV Lasix today & PO following.  Will need to monitor fluid status peri & post-operatively to avoid volume overload with decreased EF & diastolic dysfunction.  Will likely need post-operative diuresis & medication adjustment.    [facility] will be happy to be on standby to assist with his care.  I have discussed the Cardiac Risks vs. Benefits of proceeding with the operation.  At this point, his morbidity and mortality is higher without that with the operation.  He and his family (wife & daughter) all voice understanding.   His BP is a bit below his baseline today -- not enough room to reach his home BB dose of Toprol XL 50mg  daily (currently on Lopressor 12.5 mg bid -- equivalent of 1/2 dose.  I will write for IV Lasix & standing PO lasix.  Will check proBNP & CXR to assess for  pulmonary edema (exam suggested that this may be a potential) Continue Imdur & up-titrate BB to 25 mg BID (or 2.5 mg IV q6hr if NPO post-operatively)  [facility] will follow along.  Marykay Lex, M.D., M.S. [facility] [address] [place], [place]  [address]  [phone] Pager # [phone] 09/06/2012 4:04 PM

## 2012-09-07 ENCOUNTER — Encounter (HOSPITAL_COMMUNITY): Payer: Self-pay | Admitting: *Deleted

## 2012-09-07 ENCOUNTER — Encounter (HOSPITAL_COMMUNITY): Payer: Self-pay | Admitting: Anesthesiology

## 2012-09-07 ENCOUNTER — Inpatient Hospital Stay (HOSPITAL_COMMUNITY): Payer: Medicare Other

## 2012-09-07 ENCOUNTER — Encounter (HOSPITAL_COMMUNITY)
Admission: EM | Disposition: A | Discharge status: Home Health | Payer: Self-pay | Source: Home / Self Care | Attending: Family Medicine

## 2012-09-07 ENCOUNTER — Inpatient Hospital Stay (HOSPITAL_COMMUNITY): Payer: Medicare Other | Admitting: *Deleted

## 2012-09-07 DIAGNOSIS — I2510 Atherosclerotic heart disease of native coronary artery without angina pectoris: Secondary | ICD-10-CM

## 2012-09-07 DIAGNOSIS — C189 Malignant neoplasm of colon, unspecified: Secondary | ICD-10-CM

## 2012-09-07 HISTORY — PX: COLON RESECTION: SHX5231

## 2012-09-07 HISTORY — PX: PORTACATH PLACEMENT: SHX2246

## 2012-09-07 LAB — MRSA PCR SCREENING: MRSA by PCR: NEGATIVE

## 2012-09-07 LAB — URINALYSIS, ROUTINE W REFLEX MICROSCOPIC
Glucose, UA: NEGATIVE mg/dL
Specific Gravity, Urine: 1.023 (ref 1.005–1.030)
Urobilinogen, UA: 1 mg/dL (ref 0.0–1.0)
pH: 5.5 (ref 5.0–8.0)

## 2012-09-07 LAB — CBC
Hemoglobin: 10.7 g/dL — ABNORMAL LOW (ref 13.0–17.0)
MCH: 28.5 pg (ref 26.0–34.0)
MCHC: 33.4 g/dL (ref 30.0–36.0)
RDW: 14.9 % (ref 11.5–15.5)

## 2012-09-07 LAB — BASIC METABOLIC PANEL
BUN: 22 mg/dL (ref 6–23)
Calcium: 8.4 mg/dL (ref 8.4–10.5)
GFR calc Af Amer: 88 mL/min — ABNORMAL LOW (ref >90)
GFR calc non Af Amer: 76 mL/min — ABNORMAL LOW (ref >90)
Glucose, Bld: 136 mg/dL — ABNORMAL HIGH (ref 70–99)
Sodium: 138 mEq/L (ref 135–145)

## 2012-09-07 LAB — URINE MICROSCOPIC-ADD ON

## 2012-09-07 LAB — GLUCOSE, CAPILLARY
Glucose-Capillary: 130 mg/dL — ABNORMAL HIGH (ref 70–99)
Glucose-Capillary: 134 mg/dL — ABNORMAL HIGH (ref 70–99)

## 2012-09-07 LAB — TYPE AND SCREEN: Antibody Screen: NEGATIVE

## 2012-09-07 SURGERY — LAPAROSCOPIC SIGMOID COLON RESECTION
Anesthesia: General | Site: Chest | Wound class: Clean Contaminated

## 2012-09-07 MED ORDER — HYDROMORPHONE HCL PF 1 MG/ML IJ SOLN
INTRAMUSCULAR | Status: AC
  Filled 2012-09-07: qty 1

## 2012-09-07 MED ORDER — FUROSEMIDE 40 MG PO TABS
40.0000 mg | ORAL_TABLET | Freq: Two times a day (BID) | ORAL | Status: DC
  Administered 2012-09-08 – 2012-09-11 (×6): 40 mg via ORAL
  Filled 2012-09-07 (×11): qty 1

## 2012-09-07 MED ORDER — ONDANSETRON HCL 4 MG/2ML IJ SOLN: 4.0000 mg | Freq: Four times a day (QID) | INTRAMUSCULAR | Status: DC | PRN

## 2012-09-07 MED ORDER — ONDANSETRON HCL 4 MG/2ML IJ SOLN
4.0000 mg | Freq: Four times a day (QID) | INTRAMUSCULAR | Status: DC | PRN
  Administered 2012-09-09 (×2): 4 mg via INTRAVENOUS
  Filled 2012-09-07 (×2): qty 2

## 2012-09-07 MED ORDER — ROCURONIUM BROMIDE 100 MG/10ML IV SOLN
INTRAVENOUS | Status: DC | PRN
  Administered 2012-09-07 (×2): 10 mg via INTRAVENOUS
  Administered 2012-09-07: 30 mg via INTRAVENOUS
  Administered 2012-09-07 (×2): 10 mg via INTRAVENOUS

## 2012-09-07 MED ORDER — ALBUTEROL SULFATE HFA 108 (90 BASE) MCG/ACT IN AERS
INHALATION_SPRAY | RESPIRATORY_TRACT | Status: DC | PRN
  Administered 2012-09-07 (×2): 4 via RESPIRATORY_TRACT

## 2012-09-07 MED ORDER — SUCCINYLCHOLINE CHLORIDE 20 MG/ML IJ SOLN
INTRAMUSCULAR | Status: DC | PRN
  Administered 2012-09-07: 100 mg via INTRAVENOUS

## 2012-09-07 MED ORDER — HEPARIN SOD (PORK) LOCK FLUSH 100 UNIT/ML IV SOLN
INTRAVENOUS | Status: AC
  Filled 2012-09-07: qty 5

## 2012-09-07 MED ORDER — ISOSORBIDE DINITRATE 30 MG PO TABS
30.0000 mg | ORAL_TABLET | Freq: Two times a day (BID) | ORAL | Status: DC
  Administered 2012-09-08 – 2012-09-14 (×13): 30 mg via ORAL
  Filled 2012-09-07 (×15): qty 1

## 2012-09-07 MED ORDER — LIDOCAINE HCL (CARDIAC) 20 MG/ML IV SOLN
INTRAVENOUS | Status: DC | PRN
  Administered 2012-09-07: 80 mg via INTRAVENOUS

## 2012-09-07 MED ORDER — OXYCODONE HCL 5 MG/5ML PO SOLN: 5.0000 mg | Freq: Once | ORAL | Status: DC | PRN

## 2012-09-07 MED ORDER — HEPARIN SODIUM (PORCINE) 5000 UNIT/ML IJ SOLN
5000.0000 [IU] | Freq: Three times a day (TID) | INTRAMUSCULAR | Status: DC
  Administered 2012-09-08 – 2012-09-14 (×19): 5000 [IU] via SUBCUTANEOUS
  Filled 2012-09-07 (×23): qty 1

## 2012-09-07 MED ORDER — GLYCOPYRROLATE 0.2 MG/ML IJ SOLN
INTRAMUSCULAR | Status: DC | PRN
  Administered 2012-09-07: 0.4 mg via INTRAVENOUS

## 2012-09-07 MED ORDER — HYDROMORPHONE HCL PF 1 MG/ML IJ SOLN
1.0000 mg | INTRAMUSCULAR | Status: DC | PRN
  Administered 2012-09-07 – 2012-09-08 (×9): 1 mg via INTRAVENOUS
  Filled 2012-09-07: qty 1
  Filled 2012-09-07: qty 2
  Filled 2012-09-07 (×3): qty 1
  Filled 2012-09-07: qty 2
  Filled 2012-09-07 (×3): qty 1

## 2012-09-07 MED ORDER — ACETAMINOPHEN 10 MG/ML IV SOLN
1000.0000 mg | Freq: Once | INTRAVENOUS | Status: AC
  Administered 2012-09-07: 1000 mg via INTRAVENOUS
  Filled 2012-09-07: qty 100

## 2012-09-07 MED ORDER — MIDAZOLAM HCL 5 MG/5ML IJ SOLN
INTRAMUSCULAR | Status: DC | PRN
  Administered 2012-09-07: 1 mg via INTRAVENOUS

## 2012-09-07 MED ORDER — DEXTROSE 5 % IV SOLN
INTRAVENOUS | Status: DC | PRN
  Administered 2012-09-07: 15:00:00 via INTRAVENOUS

## 2012-09-07 MED ORDER — SODIUM CHLORIDE 0.9 % IR SOLN
Status: DC | PRN
  Administered 2012-09-07: 16:00:00

## 2012-09-07 MED ORDER — LIDOCAINE HCL 4 % MT SOLN
OROMUCOSAL | Status: DC | PRN
  Administered 2012-09-07: 4 mL via TOPICAL

## 2012-09-07 MED ORDER — FENTANYL CITRATE 0.05 MG/ML IJ SOLN
INTRAMUSCULAR | Status: DC | PRN
  Administered 2012-09-07: 100 ug via INTRAVENOUS
  Administered 2012-09-07 (×2): 50 ug via INTRAVENOUS

## 2012-09-07 MED ORDER — LIDOCAINE-EPINEPHRINE (PF) 1 %-1:200000 IJ SOLN
INTRAMUSCULAR | Status: AC
  Filled 2012-09-07: qty 10

## 2012-09-07 MED ORDER — ONDANSETRON HCL 4 MG PO TABS
4.0000 mg | ORAL_TABLET | Freq: Four times a day (QID) | ORAL | Status: DC | PRN
  Administered 2012-09-09: 4 mg via ORAL
  Filled 2012-09-07: qty 1

## 2012-09-07 MED ORDER — FUROSEMIDE 40 MG PO TABS: 40.0000 mg | ORAL_TABLET | Freq: Two times a day (BID) | ORAL | Status: DC

## 2012-09-07 MED ORDER — POTASSIUM CHLORIDE IN NACL 20-0.45 MEQ/L-% IV SOLN
INTRAVENOUS | Status: DC
  Administered 2012-09-07 – 2012-09-12 (×8): via INTRAVENOUS
  Filled 2012-09-07 (×16): qty 1000

## 2012-09-07 MED ORDER — OXYCODONE HCL 5 MG PO TABS: 5.0000 mg | ORAL_TABLET | Freq: Once | ORAL | Status: DC | PRN

## 2012-09-07 MED ORDER — ACETAMINOPHEN 10 MG/ML IV SOLN
1000.0000 mg | Freq: Four times a day (QID) | INTRAVENOUS | Status: AC
  Administered 2012-09-08 (×4): 1000 mg via INTRAVENOUS
  Filled 2012-09-07 (×6): qty 100

## 2012-09-07 MED ORDER — LACTATED RINGERS IV SOLN
INTRAVENOUS | Status: DC
  Administered 2012-09-07: 10:00:00 via INTRAVENOUS

## 2012-09-07 MED ORDER — 0.9 % SODIUM CHLORIDE (POUR BTL) OPTIME
TOPICAL | Status: DC | PRN
  Administered 2012-09-07 (×3): 1000 mL

## 2012-09-07 MED ORDER — MORPHINE SULFATE 2 MG/ML IJ SOLN
1.0000 mg | INTRAMUSCULAR | Status: DC | PRN
  Administered 2012-09-07 (×2): 4 mg via INTRAVENOUS
  Filled 2012-09-07 (×2): qty 2

## 2012-09-07 MED ORDER — BUPIVACAINE HCL (PF) 0.25 % IJ SOLN
INTRAMUSCULAR | Status: DC | PRN
  Administered 2012-09-07: 30 mL

## 2012-09-07 MED ORDER — HYDROMORPHONE HCL PF 1 MG/ML IJ SOLN
0.2500 mg | INTRAMUSCULAR | Status: DC | PRN
  Administered 2012-09-07 (×4): 0.25 mg via INTRAVENOUS

## 2012-09-07 MED ORDER — SODIUM CHLORIDE 0.9 % IR SOLN
Status: DC | PRN
  Administered 2012-09-07: 1000 mL

## 2012-09-07 MED ORDER — ONDANSETRON HCL 4 MG/2ML IJ SOLN
INTRAMUSCULAR | Status: DC | PRN
  Administered 2012-09-07: 4 mg via INTRAVENOUS

## 2012-09-07 MED ORDER — SODIUM CHLORIDE 0.9 % IV SOLN
10.0000 mg | INTRAVENOUS | Status: DC | PRN
  Administered 2012-09-07: 10 ug/min via INTRAVENOUS

## 2012-09-07 MED ORDER — HEPARIN SOD (PORK) LOCK FLUSH 100 UNIT/ML IV SOLN
INTRAVENOUS | Status: DC | PRN
  Administered 2012-09-07: 400 [IU] via INTRAVENOUS

## 2012-09-07 MED ORDER — NEOSTIGMINE METHYLSULFATE 1 MG/ML IJ SOLN
INTRAMUSCULAR | Status: DC | PRN
  Administered 2012-09-07: 3 mg via INTRAVENOUS

## 2012-09-07 MED ORDER — ACETAMINOPHEN 10 MG/ML IV SOLN
INTRAVENOUS | Status: DC | PRN
  Administered 2012-09-07: 1000 mg via INTRAVENOUS

## 2012-09-07 MED ORDER — ETOMIDATE 2 MG/ML IV SOLN
INTRAVENOUS | Status: DC | PRN
  Administered 2012-09-07: 16 mg via INTRAVENOUS

## 2012-09-07 MED ORDER — ACETAMINOPHEN 10 MG/ML IV SOLN
INTRAVENOUS | Status: AC
  Filled 2012-09-07: qty 100

## 2012-09-07 MED ORDER — LACTATED RINGERS IV SOLN
INTRAVENOUS | Status: DC | PRN
  Administered 2012-09-07 (×2): via INTRAVENOUS

## 2012-09-07 MED ORDER — BUPIVACAINE HCL (PF) 0.25 % IJ SOLN
INTRAMUSCULAR | Status: AC
  Filled 2012-09-07: qty 60

## 2012-09-07 MED ORDER — METOPROLOL TARTRATE 1 MG/ML IV SOLN
5.0000 mg | Freq: Four times a day (QID) | INTRAVENOUS | Status: DC
  Filled 2012-09-07 (×5): qty 5

## 2012-09-07 SURGICAL SUPPLY — 111 items
APL SKNCLS STERI-STRIP NONHPOA (GAUZE/BANDAGES/DRESSINGS) ×2
APPLIER CLIP ROT 10 11.4 M/L (STAPLE)
APR CLP MED LRG 11.4X10 (STAPLE)
BAG DECANTER FOR FLEXI CONT (MISCELLANEOUS) ×3 IMPLANT
BENZOIN TINCTURE PRP APPL 2/3 (GAUZE/BANDAGES/DRESSINGS) ×3 IMPLANT
BLADE SURG 10 STRL SS (BLADE) ×3 IMPLANT
BLADE SURG 15 STRL LF DISP TIS (BLADE) ×2 IMPLANT
BLADE SURG 15 STRL SS (BLADE) ×3
BLADE SURG ROTATE 9660 (MISCELLANEOUS) IMPLANT
CANISTER SUCTION 2500CC (MISCELLANEOUS) ×6 IMPLANT
CELLS DAT CNTRL 66122 CELL SVR (MISCELLANEOUS) ×4 IMPLANT
CHLORAPREP W/TINT 10.5 ML (MISCELLANEOUS) ×3 IMPLANT
CLIP APPLIE ROT 10 11.4 M/L (STAPLE) IMPLANT
CLOTH BEACON ORANGE TIMEOUT ST (SAFETY) ×3 IMPLANT
COVER MAYO STAND STRL (DRAPES) ×3 IMPLANT
COVER SURGICAL LIGHT HANDLE (MISCELLANEOUS) ×3 IMPLANT
COVER TRANSDUCER ULTRASND GEL (DRAPE) ×3 IMPLANT
CRADLE DONUT ADULT HEAD (MISCELLANEOUS) ×3 IMPLANT
CUTTER LINEAR ENDO 35 ETS (STAPLE) IMPLANT
CUTTER LINEAR ENDO 35 ETS TH (STAPLE) IMPLANT
DECANTER SPIKE VIAL GLASS SM (MISCELLANEOUS) IMPLANT
DRAPE C-ARM 42X72 X-RAY (DRAPES) ×3 IMPLANT
DRAPE CHEST BREAST 15X10 FENES (DRAPES) ×3 IMPLANT
DRAPE LAPAROSCOPIC ABDOMINAL (DRAPES) IMPLANT
DRAPE PROXIMA HALF (DRAPES) ×3 IMPLANT
DRAPE UTILITY 15X26 W/TAPE STR (DRAPE) ×12 IMPLANT
DRAPE WARM FLUID 44X44 (DRAPE) ×3 IMPLANT
ELECT CAUTERY BLADE 6.4 (BLADE) ×6 IMPLANT
ELECT REM PT RETURN 9FT ADLT (ELECTROSURGICAL) ×3
ELECTRODE REM PT RTRN 9FT ADLT (ELECTROSURGICAL) ×2 IMPLANT
GAUZE SPONGE 2X2 8PLY STRL LF (GAUZE/BANDAGES/DRESSINGS) ×2 IMPLANT
GAUZE SPONGE 4X4 16PLY XRAY LF (GAUZE/BANDAGES/DRESSINGS) ×3 IMPLANT
GEL ULTRASOUND 20GR AQUASONIC (MISCELLANEOUS) IMPLANT
GLOVE BIO SURGEON STRL SZ7.5 (GLOVE) ×6 IMPLANT
GLOVE BIOGEL PI IND STRL 6.5 (GLOVE) ×6 IMPLANT
GLOVE BIOGEL PI IND STRL 7.0 (GLOVE) ×4 IMPLANT
GLOVE BIOGEL PI IND STRL 7.5 (GLOVE) ×2 IMPLANT
GLOVE BIOGEL PI INDICATOR 6.5 (GLOVE) ×3
GLOVE BIOGEL PI INDICATOR 7.0 (GLOVE) ×2
GLOVE BIOGEL PI INDICATOR 7.5 (GLOVE) ×1
GLOVE ECLIPSE 6.5 STRL STRAW (GLOVE) ×3 IMPLANT
GLOVE EUDERMIC 7 POWDERFREE (GLOVE) ×6 IMPLANT
GLOVE SS BIOGEL STRL SZ 7 (GLOVE) ×2 IMPLANT
GLOVE SUPERSENSE BIOGEL SZ 7 (GLOVE) ×1
GLOVE SURG SIGNA 7.5 PF LTX (GLOVE) ×6 IMPLANT
GOWN PREVENTION PLUS XLARGE (GOWN DISPOSABLE) ×9 IMPLANT
GOWN STRL NON-REIN LRG LVL3 (GOWN DISPOSABLE) ×12 IMPLANT
GOWN STRL REIN XL XLG (GOWN DISPOSABLE) ×3 IMPLANT
INTRODUCER 13FR (MISCELLANEOUS) IMPLANT
KIT BASIN OR (CUSTOM PROCEDURE TRAY) ×3 IMPLANT
KIT OSTOMY DRAINABLE 2.75 STR (WOUND CARE) ×3 IMPLANT
KIT PORT POWER 9.6FR MRI PREA (Catheter) IMPLANT
KIT PORT POWER ISP 8FR (Catheter) IMPLANT
KIT POWER CATH 8FR (Catheter) ×3 IMPLANT
KIT ROOM TURNOVER OR (KITS) ×3 IMPLANT
LEGGING LITHOTOMY PAIR STRL (DRAPES) IMPLANT
LIGASURE IMPACT 36 18CM CVD LR (INSTRUMENTS) IMPLANT
NEEDLE HYPO 25GX1X1/2 BEV (NEEDLE) ×3 IMPLANT
NS IRRIG 1000ML POUR BTL (IV SOLUTION) ×9 IMPLANT
PACK SURGICAL SETUP 50X90 (CUSTOM PROCEDURE TRAY) ×3 IMPLANT
PAD ARMBOARD 7.5X6 YLW CONV (MISCELLANEOUS) ×6 IMPLANT
PAD SHARPS MAGNETIC DISPOSAL (MISCELLANEOUS) IMPLANT
PENCIL BUTTON HOLSTER BLD 10FT (ELECTRODE) ×6 IMPLANT
RELOAD /EVU35 (ENDOMECHANICALS) IMPLANT
RELOAD CUTTER ETS 35MM STAND (ENDOMECHANICALS) IMPLANT
RELOAD PROXIMATE 75MM BLUE (ENDOMECHANICALS) ×3 IMPLANT
RTRCTR WOUND ALEXIS 18CM MED (MISCELLANEOUS) ×6
SCALPEL HARMONIC ACE (MISCELLANEOUS) ×3 IMPLANT
SCISSORS LAP 5X35 DISP (ENDOMECHANICALS) IMPLANT
SET IRRIG TUBING LAPAROSCOPIC (IRRIGATION / IRRIGATOR) ×3 IMPLANT
SET SHEATH INTRODUCER 10FR (MISCELLANEOUS) IMPLANT
SHEATH COOK PEEL AWAY SET 9F (SHEATH) IMPLANT
SLEEVE ENDOPATH XCEL 5M (ENDOMECHANICALS) ×9 IMPLANT
SPECIMEN JAR LARGE (MISCELLANEOUS) ×3 IMPLANT
SPONGE GAUZE 2X2 STER 10/PKG (GAUZE/BANDAGES/DRESSINGS) ×1
SPONGE GAUZE 4X4 12PLY (GAUZE/BANDAGES/DRESSINGS) ×3 IMPLANT
SPONGE LAP 18X18 X RAY DECT (DISPOSABLE) IMPLANT
STAPLER PROXIMATE 75MM BLUE (STAPLE) ×3 IMPLANT
STAPLER VISISTAT 35W (STAPLE) ×3 IMPLANT
STRIP CLOSURE SKIN 1/4X4 (GAUZE/BANDAGES/DRESSINGS) ×3 IMPLANT
SURGILUBE 2OZ TUBE FLIPTOP (MISCELLANEOUS) IMPLANT
SUT PDS AB 1 CT  36 (SUTURE) ×2
SUT PDS AB 1 CT 36 (SUTURE) ×4 IMPLANT
SUT PDS AB 1 CTX 36 (SUTURE) IMPLANT
SUT PROLENE 2 0 CT2 30 (SUTURE) ×3 IMPLANT
SUT SILK 2 0 (SUTURE) ×1
SUT SILK 2 0 SH CR/8 (SUTURE) ×3 IMPLANT
SUT SILK 2-0 18XBRD TIE 12 (SUTURE) ×2 IMPLANT
SUT SILK 3 0 (SUTURE) ×3
SUT SILK 3 0 SH CR/8 (SUTURE) ×3 IMPLANT
SUT SILK 3-0 18XBRD TIE 12 (SUTURE) ×2 IMPLANT
SUT VIC AB 3-0 SH 18 (SUTURE) ×9 IMPLANT
SUT VIC AB 5-0 PS2 18 (SUTURE) ×3 IMPLANT
SYR 20ML ECCENTRIC (SYRINGE) ×6 IMPLANT
SYR 5ML LUER SLIP (SYRINGE) ×3 IMPLANT
SYR BULB 3OZ (MISCELLANEOUS) ×3 IMPLANT
SYR BULB IRRIGATION 50ML (SYRINGE) ×3 IMPLANT
SYR CONTROL 10ML LL (SYRINGE) ×3 IMPLANT
TOWEL OR 17X24 6PK STRL BLUE (TOWEL DISPOSABLE) ×3 IMPLANT
TOWEL OR 17X26 10 PK STRL BLUE (TOWEL DISPOSABLE) ×6 IMPLANT
TRAY FOLEY CATH 14FRSI W/METER (CATHETERS) ×3 IMPLANT
TRAY LAPAROSCOPIC (CUSTOM PROCEDURE TRAY) ×3 IMPLANT
TRAY PROCTOSCOPIC FIBER OPTIC (SET/KITS/TRAYS/PACK) IMPLANT
TROCAR XCEL BLUNT TIP 100MML (ENDOMECHANICALS) ×3 IMPLANT
TROCAR XCEL NON-BLD 11X100MML (ENDOMECHANICALS) IMPLANT
TROCAR XCEL NON-BLD 5MMX100MML (ENDOMECHANICALS) IMPLANT
TROCAR Z-THREAD FIOS 5X100MM (TROCAR) ×6 IMPLANT
TUBE CONNECTING 12X1/4 (SUCTIONS) ×3 IMPLANT
TUBING FILTER THERMOFLATOR (ELECTROSURGICAL) ×3 IMPLANT
WATER STERILE IRR 1000ML POUR (IV SOLUTION) ×3 IMPLANT
YANKAUER SUCT BULB TIP NO VENT (SUCTIONS) ×6 IMPLANT

## 2012-09-07 NOTE — Brief Op Note (Signed)
09/04/2012 - 09/07/2012  4:03 PM  [name]:  Todd [name], 79 y.o., male, MRN: [id number]  PREOP DIAGNOSIS:  colon cancer  POSTOP DIAGNOSIS:   Obstructing sigmoid colon cancer, mets to liver, hematuria  PROCEDURE:   Procedure(s): LAPAROSCOPIC ASSISTED SIGMOID COLON RESECTION,  END  Left OSTOMY, Hartmann's pouch, Placement of power port  SURGEON:   Ovidio Kin, M.D.  Threasa HeadsIvor Reining, M.D.  ANESTHESIA:   general  Anesthesiologist: Raiford Simmonds, MD CRNA: Armandina Gemma  General  EBL:  100  ml  BLOOD ADMINISTERED: none  DRAINS: none   LOCAL MEDICATIONS USED:   30 cc 1/4 marcaine  SPECIMEN:   Sigmoid colon, suture distal  COUNTS CORRECT:  YES  INDICATIONS FOR PROCEDURE:  [name] is a 79 y.o. (DOB: 1929-11-24) white  male whose primary care physician is [name], MD and comes for sigmoid colectomy, end colostomy, placement of power port.   The indications and risks of the surgery were explained to the [name].  The risks include, but are not limited to, infection, bleeding, and nerve injury.  The [name] has a known obstructing sigmoid cancer with metastatic disease, so he understands this is a palliative procedure.  Note dictated to:   [phone]

## 2012-09-07 NOTE — Progress Notes (Signed)
I came by to see the patient in PACU post-operatively.  He seems to be doing quite well.  UOP stable during case - once foley flushed.  Lung sounds are diminished, but no obvious rales.   BP & HR are stable.    Would defer to Anesthesiologist, but would be ok with a dose of IV Lasix if it would help for extubation.  The patient is wide awake & motioning that he would like the tube out.   CXR pending to determine position of Central line (port-a cath) placed in OR -- would use IV lasix if the is evidence of pulmonary edema. I had already written for standing PO Lasix - can be adjusted per my partners over the next few days.  Otherwise, he seems quite stable from a cardiac standpoint.  We will continue to follow along.  Marland KitchenMarykay Lex, M.D., M.S. [facility] [address] [place], [place]  [address]  [phone] Pager # [phone] 09/07/2012 5:11 PM

## 2012-09-07 NOTE — Progress Notes (Signed)
DR. Ezzard Standing at bedside  And resident for family care, chest xray report given to Dr. Ezzard Standing no orders received. Resident spoke with family and examing  The  Patient.  Dr. Ezzard Standing want s to do a bladder scan

## 2012-09-07 NOTE — Preoperative (Signed)
Beta Blockers   Reason not to administer Beta Blockers:Not Applicable 

## 2012-09-07 NOTE — Progress Notes (Signed)
FMTS Attending Daily Note: Todd Levy MD [phone] pager office [phone] I  have seen and examined this patient, reviewed their chart. I have discussed this patient with the resident. I agree with the resident's findings, assessment and care plan.Did OK during his surgery and will be in ICU for 24 hours or so and then once extubated will return to our service. I have spoken with [name] and with his family and they are taking things one steop ata time.

## 2012-09-07 NOTE — Progress Notes (Signed)
Dr. Nicholaus Corolla at bedside had pt take deep breathe, extubated suctioned orally before pulling ETT and O2 simple mask initiated at 10/L

## 2012-09-07 NOTE — Anesthesia Procedure Notes (Signed)
Procedure Name: Intubation Date/Time: 09/07/2012 12:55 PM Performed by: Armandina Gemma MARIE Pre-anesthesia Checklist: Patient identified, Emergency Drugs available, Suction available, Patient being monitored and Timeout performed Patient Re-evaluated:Patient Re-evaluated prior to inductionOxygen Delivery Method: Circle system utilized Preoxygenation: Pre-oxygenation with 100% oxygen Intubation Type: IV induction and Rapid sequence Laryngoscope Size: Miller and 2 Grade View: Grade I Tube type: Subglottic suction tube Number of attempts: 1 Airway Equipment and Method: Stylet and LTA kit utilized Placement Confirmation: ETT inserted through vocal cords under direct vision,  breath sounds checked- equal and bilateral and positive ETCO2 (bilat BS per Edwards) Secured at: 23 cm Tube secured with: Tape Dental Injury: Teeth and Oropharynx as per pre-operative assessment

## 2012-09-07 NOTE — Progress Notes (Signed)
Foley catheter flushed with Normal saline 50cc only 10cc return

## 2012-09-07 NOTE — Transfer of Care (Signed)
Immediate Anesthesia Transfer of Care Note  [name]: Todd [name]  Procedure(s) Performed: Procedure(s): LAPAROSCOPIC ASSISTED SIGMOID COLON RESECTION END OSTOMY (N/A) Placement of power port (N/A)  [name] Location: PACU  Anesthesia Type:General  Level of Consciousness: awake, alert , sedated and [name] cooperative  Airway & Oxygen Therapy: [name] Spontanous Breathing, [name] remains intubated per anesthesia plan and [name] placed on Ventilator (see vital sign flow sheet for setting)  Post-op Assessment: Report given to PACU RN and Post -op Vital signs reviewed and stable  Post vital signs: Reviewed and stable  Complications: No apparent anesthesia complications

## 2012-09-07 NOTE — Progress Notes (Signed)
Bladder scannned no urine present ,Dr. Ezzard Standing called patient's Urologist  And is going to replace foley with an 18 COUDE

## 2012-09-07 NOTE — Progress Notes (Signed)
DR. Herbie Baltimore at bedside spoke with pt and wrote orders

## 2012-09-07 NOTE — Progress Notes (Signed)
Family Medicine Teaching Service Daily Progress Note Service Page: [phone]  Subjective:  Feeling pretty goo dtoday. Abd pain improved but continued. Ready for surgery, now wants a port.   Objective: Temp:  [100.4 F (38 C)-101.4 F (38.6 C)] 101.4 F (38.6 C) (02/13 0501) Pulse Rate:  [74-94] 74 (02/13 0910) Resp:  [18-20] 20 (02/13 0501) BP: (118-135)/(54-63) 131/54 mmHg (02/13 0910) SpO2:  [96 %-97 %] 96 % (02/13 0501) Weight:  [225 lb (102.059 kg)] 225 lb (102.059 kg) (02/13 0501) Exam: Gen: NAD, alert, cooperative with exam HEENT: NCAT, EOMI, PERRL CV: RRR, 2-3/6 systolic murmur Resp: CTABL, no wheezes, non-labored Abd: slightly tense, tender to palpation throughout.  Ext: 1+ edema, warm Neuro: Alert and oriented, No gross deficits  I have reviewed the patient's medications, labs, imaging, and diagnostic testing.  Notable results are summarized below.  CBC BMET   Recent Labs Lab 09/04/12 2215 09/06/12 0518 09/07/12 0827  WBC 19.0* 12.5* 11.5*  HGB 13.6 10.3* 10.7*  HCT 40.4 30.8* 32.0*  PLT 246 195 184    Recent Labs Lab 09/04/12 2215 09/07/12 0827  NA 139 138  K 3.8 3.5  CL 99 103  CO2 23 22  BUN 31* 22  CREATININE 1.10 0.97  GLUCOSE 219* 136*  CALCIUM 10.4 8.4     Imaging/Diagnostic Tests: CT abd/Pelvis with 09/05/2012 IMPRESSION:  1. Suspect primary colonic malignancy at the mid sigmoid colon,  measuring 5.2 x 4.7 x 4.1 cm, with scattered small adjacent nodules  reflecting local spread of disease. No evidence for obstruction;  no perforation seen. Trace associated free fluid noted.  2. Mild diffuse wall thickening involving the proximal sigmoid  colon may reflect chronic inflammation due to the more distal mass;  the remaining colon is unremarkable.  3. Multiple mildly heterogeneous slightly ill-defined masses  throughout the right hepatic lobe, measuring up to 11.5 cm,  compatible with metastatic disease.  4. 2.4 x 1.5 cm peripancreatic node  demonstrates mild low  attenuation, likely reflecting metastatic disease.  5. 2.6 cm adrenal mass may reflect metastatic disease, though it  is nonspecific in appearance.  6. Focal left basilar airspace opacification may reflect  atelectasis or possibly pneumonia.  7. Diffuse coronary artery calcifications seen.  8. Bilateral renal cysts, particularly prominent on the left side.  9. Diffuse calcification along the abdominal aorta and its  branches.  10. Markedly enlarged prostate noted, measuring 7.7 cm in size.  11. Diffuse degenerative change noted along the lumbar spine.  CXR 09/06/2012 IMPRESSION:  1. Suspected lower lobe airspace disease may represent early  pneumonia.  2. Low lung volumes and basilar atelectasis.  3. Mild cardiomegaly.  Plan: 79 y/o male here with acute onset nausea, vomiting, and diarrhea, found to have colonic mass on CT scan.   Colonic mass - Likely metastsatic GI Ca - Continue flagyl and cipro for now with fever - GI Consulting - appprecuiate reccs  - Flex sig on 09/05/2012- circumferential mass nearly obstructing the colon 35 cm in  - Tissue pathology pending  - Cdiff negative, Stool culture pending - Surgery consulting - appreciate reccs - plan OR probably today, planning bowel resection and port placement - Oncology consulting- apprecfiate reccs  - CEA is 59.3, needs port for expected chemo, agree with surgery - Ultram and morphine for pain.   Nausea, vomiting and diarrhea - Improved since admission, likely related to mass/mets  - PRN zofran  Pneumonia- CAP - Febrile to 101.4 this am.  -  Normal work  of breathinig - Levaquin day 2 - Blood cultures pending  CAD, CHF, Hx of MI, CHF -Cardiology consulting- appreciate reccs.   - restart IMDUR, adding lopressor 12.5 BID  - TTE with 35-40% and grade 1 diastolic dysf  - Hold ASA and statin  HTN - BP: 131/54, pulse: 70s - Hold home: HCTZ, benzapril - IMDUR, BB started/addded per  cards  HLD - hold simvastatin  DM2 - CBGs: 120-134 - A1C 6.2 - SSI, diet controlled at home  BPH - Continue doxazosin  FEN/GI: NS at 100 cc/hr, liquids, will advance as tolerated PPx: SCDs Dispo: Home after surgical intervention Code Status: DNR  Kevin Fenton, MD 09/07/2012, 10:41 AM

## 2012-09-07 NOTE — Anesthesia Preprocedure Evaluation (Addendum)
Anesthesia Evaluation  Patient identified by MRN, date of birth, ID band Patient awake    Reviewed: Allergy & Precautions, H&P , NPO status , Patient's Chart, lab work & pertinent test results, reviewed documented beta blocker date and time   History of Anesthesia Complications Negative for: history of anesthetic complications  Airway Mallampati: II TM Distance: >3 FB Neck ROM: full    Dental  (+) Teeth Intact and Dental Advisory Given   Pulmonary pneumonia - (questionable PNA by CXR), former smoker,  + rhonchi   + decreased breath sounds      Cardiovascular hypertension, Pt. on medications and Pt. on home beta blockers + CAD, + Past MI (1990), +CHF (EF 30%) and + DOE + Valvular Problems/Murmurs  distant h/o anterior MI with 100% occluded LAD & associated Ischemic Cardiomyopathy EF ~35% with chronic mild dependent edema (but not on Lasix)  2D Echo 09/06/12 Left ventricle: The cavity size was mildly dilated. Wall   thickness was normal. Systolic function was moderately   reduced. The estimated ejection fraction was in the range   of 35% to 40%. There is anterior, anteroseptal and apical   wall thinning and enhanced echogenicity suggesting prior   LAD territory scar. Doppler parameters are consistent with   abnormal left ventricular relaxation (grade 1 diastolic   dysfunction). The E/e' ratio is <10, suggesting normal LV   filling pressure. - Aortic valve: Mildly calcified leaflets. Peak and mean   gradients across the valve are 36 and 23 mmHg,   respectively. Based on an LVOT diameter of 2.2 cm, there   is mild aortic stenosis - calculated AVA of 1.6-1.7 cm2.   Mild regurgitation. Valve area: 1.65cm^2(VTI). Valve area:   1.55cm^2 (Vmax). - Mitral valve: Calcified annulus. Mild regurgitation. - Left atrium: Severely dilated (40.6 ml/m2). - Tricuspid valve: Mild regurgitation. - Pulmonary arteries: PA peak pressure: 47mm Hg (S) +  right   atrial pressure. - Inferior vena cava: The IVC is not well-visualized.    Neuro/Psych negative neurological ROS  negative psych ROS   GI/Hepatic Neg liver ROS, Colon CA--for resection    Endo/Other  diabetes, Type 2obese  Renal/GU negative Renal ROS     Musculoskeletal  (+) Arthritis -, Rheumatoid disorders,    Abdominal (+) + obese,   Peds  Hematology   Anesthesia Other Findings   Reproductive/Obstetrics                      Anesthesia Physical Anesthesia Plan  ASA: III  Anesthesia Plan: General   Post-op Pain Management:    Induction: Intravenous  Airway Management Planned: Oral ETT  Additional Equipment:   Intra-op Plan:   Post-operative Plan: Extubation in OR  Informed Consent: I have reviewed the patients History and Physical, chart, labs and discussed the procedure including the risks, benefits and alternatives for the proposed anesthesia with the patient or authorized representative who has indicated his/her understanding and acceptance.     Plan Discussed with: CRNA and Surgeon  Anesthesia Plan Comments:         Anesthesia Quick Evaluation

## 2012-09-08 DIAGNOSIS — C787 Secondary malignant neoplasm of liver and intrahepatic bile duct: Secondary | ICD-10-CM

## 2012-09-08 DIAGNOSIS — C189 Malignant neoplasm of colon, unspecified: Secondary | ICD-10-CM

## 2012-09-08 DIAGNOSIS — R972 Elevated prostate specific antigen [PSA]: Secondary | ICD-10-CM

## 2012-09-08 LAB — GLUCOSE, CAPILLARY: Glucose-Capillary: 118 mg/dL — ABNORMAL HIGH (ref 70–99)

## 2012-09-08 LAB — BASIC METABOLIC PANEL
BUN: 17 mg/dL (ref 6–23)
Calcium: 8.1 mg/dL — ABNORMAL LOW (ref 8.4–10.5)
GFR calc Af Amer: >90 mL/min (ref >90)
GFR calc non Af Amer: 84 mL/min — ABNORMAL LOW (ref >90)
Glucose, Bld: 112 mg/dL — ABNORMAL HIGH (ref 70–99)
Sodium: 137 mEq/L (ref 135–145)

## 2012-09-08 LAB — CBC
Hemoglobin: 10.6 g/dL — ABNORMAL LOW (ref 13.0–17.0)
MCH: 29.3 pg (ref 26.0–34.0)
MCHC: 34.3 g/dL (ref 30.0–36.0)
RDW: 14.8 % (ref 11.5–15.5)

## 2012-09-08 LAB — STOOL CULTURE

## 2012-09-08 MED ORDER — METOPROLOL TARTRATE 1 MG/ML IV SOLN
5.0000 mg | INTRAVENOUS | Status: DC | PRN
  Administered 2012-09-09: 5 mg via INTRAVENOUS
  Filled 2012-09-08 (×2): qty 5

## 2012-09-08 MED ORDER — HYDROCOD POLST-CHLORPHEN POLST 10-8 MG/5ML PO LQCR
5.0000 mL | Freq: Three times a day (TID) | ORAL | Status: DC | PRN
  Administered 2012-09-08: 5 mL via ORAL
  Filled 2012-09-08: qty 5

## 2012-09-08 MED ORDER — HYDROCOD POLST-CHLORPHEN POLST 10-8 MG/5ML PO LQCR
5.0000 mL | Freq: Two times a day (BID) | ORAL | Status: DC | PRN
  Administered 2012-09-08: 5 mL via ORAL

## 2012-09-08 MED ORDER — METOPROLOL SUCCINATE ER 25 MG PO TB24
25.0000 mg | ORAL_TABLET | Freq: Every day | ORAL | Status: DC
  Administered 2012-09-08 – 2012-09-11 (×4): 25 mg via ORAL
  Filled 2012-09-08 (×4): qty 1

## 2012-09-08 MED ORDER — SIMETHICONE 80 MG PO CHEW
80.0000 mg | CHEWABLE_TABLET | Freq: Four times a day (QID) | ORAL | Status: DC | PRN
  Filled 2012-09-08: qty 1

## 2012-09-08 MED ORDER — HYDROMORPHONE HCL PF 1 MG/ML IJ SOLN
0.5000 mg | INTRAMUSCULAR | Status: DC | PRN
Start: 2012-09-08 — End: 2012-09-09
  Administered 2012-09-08 – 2012-09-09 (×3): 0.5 mg via INTRAVENOUS
  Filled 2012-09-08 (×3): qty 1

## 2012-09-08 NOTE — Progress Notes (Signed)
Patient name: Todd [name] Medical record number: [id number] Date of birth: [birth year] Age: 79 y.o. Gender: male  Overview: 79 year old M with ischemic cardiomyopathy who underwent colectomy and colostomy on 2/13 for invasive adenocarcinoma.   S: Called to bedside for eval of "agitation" per daughter. Described as being anxious and touching his arms and face repeatedly and not being able to sit still. Occurred from 4:15-5:15 PM, which started shortly after receiving dilaudid PRN for abdominal pain. Currently, the patient denies chest pain, SOB, agitation, and anxiety.   O: BP 127/68  Pulse 92  Temp(Src) 98.4 F (36.9 C) (Oral)  Resp 16  Ht 5\' 10"  (1.778 m)  Wt 235 lb 10.8 oz (106.9 kg)  BMI 33.82 kg/m2  SpO2 96% Gen: elderly, non distressed, pleasant and conversant CV: sinus tach with numerous PVC Lung: Crackles in bases bilaterally Abd: mildly distended, NABS Psych: alert and oriented x 4, normal speech and thought content  A/P: 79 year old M who is recovering well after colectomy on 2/13 - No acute concerns for patient - Will decreased dilaudid to 0.5 mg q 1 PRN to see if this helps - Explained to daughter that ativan and benadryl could worsen or induce delirium so would be avoided  Si Raider. Clinton Sawyer, MD, MBA 09/08/2012, 6:53 PM Family Medicine Resident, PGY-2 [phone] pager

## 2012-09-08 NOTE — Op Note (Signed)
NAME:  Todd [name], Todd [name] NO.:  [id number]  MEDICAL RECORD NO.:  [id number]  LOCATION:  2316                         FACILITY:  [facility]  PHYSICIAN:  Sandria Bales. Ezzard Standing, M.D.  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  PROCEDURE:  Laparoscopic-assisted sigmoid colectomy with end colostomy and Hartmann's pouch, and placement of right internal jugular power port.  SURGEON:  Sandria Bales. Ezzard Standing, MD.  FIRST ASSISTANT:  Currie Paris, MD.  ANESTHESIA:  General endotracheal supervised by Dr. Achille Rich.  ESTIMATED BLOOD LOSS:  100 mL.  Drains left in were none.  Local medication with 30 mL of 0.25% Marcaine.  COMPLICATIONS:  None.  INDICATION FOR PROCEDURE:  Todd [name] is an 79 year old white male who sees Dr. Paula Compton of the [facility], who presented with an obstructing sigmoid colon cancer.  He has known metastatic disease and discussion carried out with the treatment.  He has seen Dr. Eli Hose and the plan is to do a sigmoid colectomy for primary resection of tumor and colostomy and then to place a Port-A-Cath in case he wants chemotherapy.  Going into the surgery, he is unsure that he wants chemotherapy.  I discussed with him the indications and complications of colon surgery and porta cath placement.   I reviewed the risks of colon surgery, including, but not limited to, infection, bleeding, and nerve injury.  The tumor is obstructing and I don't think that he is a candidate for primary anastomosis.  I also talked about the risks of power port placement.  The primary risks is pneumothorax, infection, and bleeding.   OPERATIVE NOTE:  The patient was taken to room #2 at [facility],  underwent a general anesthesia supervised by Dr. Achille Rich.  The time-out was held and surgical checklist run.    He had an NG tube.  Foley catheter in place.  Thought initially clear, after the foley had been in briefly he had hematuria.  I accessed the abdominal cavity through the left upper quadrant with a 5 mm Optiview. I placed 2 additional 5 mm trocars, 1 right lower quadrant and  1 midline.  His liver was mostly covered with omentum, so I would not get to look at the liver.  His bowel that I could see looked unremarkable except for sigmoid colon and in his midsigmoid colon, he had a large mass consistent with a primary colon cancer with nodularity on outside of the mass.   Because of his hematuria, I looked and found the foley balloon laparoscopically.  I laparoscopically mobilized the sigmoid colon and left colon up towards the splenic flexure.  When I thought I had the colon mobilized enough for resection purposes, I made a midline incision below the umbilicus, which is about 7-8 cm in diameter.  I resected the sigmoid colon about 15-20 cm in length with the tumor in the center of the resection.  I got out the tumor  itself grossly with margins on both sides at least 5 cm.  I also took out some grossly positive nodes, but probably left the nodes deep to this.  Since this was a palliative operation, I did not feel a more radical excision was warranted.  After resecting the colon, I placed 2 Prolene sutures in the distal stump.  I tagged a long suture on the distal end of the resected segment and then I mobilized his sigmoid left colon.  I gave him a colostomy in his left upper abdomen and brought this out after removing the skin.  It was sewn in place with 3-0 Vicryl sutures.  I then closed the midline wound with 2 running #1 PDS sutures.  I irrigated the wound.  I then re-laparoscoped the patient to reexamine the colostomy, re-examine the midline wound.  I injected about 30 mL of 0.25%  Marcaine.  The patient tolerated his portion of the operation with 100 mL of blood loss.    I then turned my attention putting a Port-A-Cath in him or power port. He had a roll placed under him, he was re-prepped and draped, placed in Trendelenburg position with a roll under his shoulders.  I accessed the left subclavian vein which I was able to get into, but I could not thread a guidewire into the superior vena cava.  I am not quite sure about his angle.  I tried multiple attempts to pass the guide wire, but then decided the left subclavian vein would not be accessible. I went to the right internal jugular approach.  I used an ultrasound to identify the right internal jugular vein.  I accessed the vein, threaded the guidewire down the superior vena cava.  I then sewed the port in place in the right upper chest.  I passed the tubing to the collarbone, then to guide wire site in the right neck.  I used the 8-French introducer to introduce the tubing in.  I tried to position the tip of the tubing at the right atrium superior vena cava junction.  This was checked under fluoroscopy.  I then flushed the entire unit with dilute saline, attached the tubing to the port with a bayonet device and then flushed the entire unit with 4 mL of concentrated heparin.  I rechecked the placement with fluoroscopy.  The catheter both aspirated and injected fluid well through the port and it looked like the port lay out in a proper position.  I then closed the tissues with 3-0 Vicryl suture in subcutaneous, sutures of 5-0 Vicryl in the skin and painted the wound with tincture of benzoin and Steri-Strips.  The patient tolerated the procedure well.  He had decreased urine output at the end.  We flushed the Foley to make sure this was patent.  He was transferred to the recovery room intubated.  I do plan to put him in the intensive care unit.   Sandria Bales. Ezzard Standing, M.D., FACs   DHN/MEDQ  D:  09/07/2012  T:  09/08/2012  Job:   [id number]  cc:   Kevin Fenton, M.D. Benjiman Core, M.D. Landry Corporal, MD Everardo All. Madilyn Fireman, M.D. Nestor Ramp, MD Paula Compton, MD

## 2012-09-08 NOTE — Progress Notes (Signed)
Family Medicine Teaching Service Daily Progress Note Service Page: [phone]  Subjective:  Feeling well this am. No complaints. Denies chest pain. Pain in lower abdomen controlled but continued.   Objective: Temp:  [97.4 F (36.3 C)-98.7 F (37.1 C)] 98.2 F (36.8 C) (02/14 0400) Pulse Rate:  [66-85] 85 (02/14 0600) Resp:  [15-28] 18 (02/14 0600) BP: (96-134)/(41-60) 119/49 mmHg (02/14 0600) SpO2:  [92 %-100 %] 92 % (02/14 0600) FiO2 (%):  [40 %-60 %] 40 % (02/13 1656) Weight:  [224 lb 14.4 oz (102.014 kg)] 224 lb 14.4 oz (102.014 kg) (02/14 0500) Exam: Gen: NAD, alert, cooperative with exam HEENT: NCAT, EOMI, PERRL CV: RRR, 2-3/6 systolic murmur Resp: Some scattered wheezes, no crackles. non-labored Abd: slightly tense, tender to palpation throughout., new ostomy in place.  Ext: 1+ edema, warm Neuro: Alert and oriented, No gross deficits  I have reviewed the patient's medications, labs, imaging, and diagnostic testing.  Notable results are summarized below.  CBC BMET   Recent Labs Lab 09/06/12 0518 09/07/12 0827 09/08/12 0315  WBC 12.5* 11.5* 9.2  HGB 10.3* 10.7* 10.6*  HCT 30.8* 32.0* 30.9*  PLT 195 184 175    Recent Labs Lab 09/04/12 2215 09/07/12 0827 09/08/12 0315  NA 139 138 137  K 3.8 3.5 3.7  CL 99 103 104  CO2 23 22 22   BUN 31* 22 17  CREATININE 1.10 0.97 0.76  GLUCOSE 219* 136* 112*  CALCIUM 10.4 8.4 8.1*     Imaging/Diagnostic Tests: CT abd/Pelvis with 09/05/2012 IMPRESSION:  1. Suspect primary colonic malignancy at the mid sigmoid colon,  measuring 5.2 x 4.7 x 4.1 cm, with scattered small adjacent nodules  reflecting local spread of disease. No evidence for obstruction;  no perforation seen. Trace associated free fluid noted.  2. Mild diffuse wall thickening involving the proximal sigmoid  colon may reflect chronic inflammation due to the more distal mass;  the remaining colon is unremarkable.  3. Multiple mildly heterogeneous slightly  ill-defined masses  throughout the right hepatic lobe, measuring up to 11.5 cm,  compatible with metastatic disease.  4. 2.4 x 1.5 cm peripancreatic node demonstrates mild low  attenuation, likely reflecting metastatic disease.  5. 2.6 cm adrenal mass may reflect metastatic disease, though it  is nonspecific in appearance.  6. Focal left basilar airspace opacification may reflect  atelectasis or possibly pneumonia.  7. Diffuse coronary artery calcifications seen.  8. Bilateral renal cysts, particularly prominent on the left side.  9. Diffuse calcification along the abdominal aorta and its  branches.  10. Markedly enlarged prostate noted, measuring 7.7 cm in size.  11. Diffuse degenerative change noted along the lumbar spine.  CXR 09/06/2012 IMPRESSION:  1. Suspected lower lobe airspace disease may represent early  pneumonia.  2. Low lung volumes and basilar atelectasis.  3. Mild cardiomegaly.  CXR 09/08/2012 IMPRESSION:  Right port has been placed. The tip appears be at the level of the  cavoatrial junction and possibly proximal right atrium. This can  be assessed on follow-up two-view exam when the patient is able.  Plan: 79 y/o male here with acute onset nausea, vomiting, and diarrhea, found to have colonic mass on CT scan.   Colonic mass, now S/p sigmoid colectomy and power port placement day 1 - Likely metastsatic GI Ca - On flagyl and cipro, possibly DC and narrow antibiotic therapy to levaquin only  - GI Consulting - appprecuiate reccs  - Flex sig on 09/05/2012- circumferential mass nearly obstructing the colon 35  cm in  - Tissue pathology pending  - Cdiff negative, Stool culture pending - Surgery consulting - appreciate reccs and management - Oncology consulting- apprecfiate reccs  - CEA is 59.3, needs port for expected chemo, agree with surgery - IV tylenol Q6 and PRN morphine for pain.   Nausea, vomiting and diarrhea - Improved since admission, likely related to  mass/mets  - PRN zofran  Pneumonia- CAP - Afebrile  - Normal work of breathinig - Levaquin day 3 - Blood cultures NGTD  CAD, CHF, Hx of MI, CHF -Cardiology consulting- appreciate reccs.   - Continue IMDUR, lopressor 12.5 BID  - TTE with 35-40% and grade 1 diastolic dysf  - Hold ASA and statin  HTN - BP: 134/56, pulse 80 - Hold home: HCTZ, benzapril - IMDUR, BB started/addded per cards  HLD - hold simvastatin  DM2 - CBGs: 114-126 - A1C 6.2 - SSI, diet controlled at home, decrease to Q8 checks, still NPO except for chips and meds  BPH - Continue doxazosin  FEN/GI: 1/2NS at 100 cc/hr, LR at 50 ml/hr, liquids, will advance as tolerated PPx: SCDs Dispo: Home after surgical intervention Code Status: DNR  Kevin Fenton, MD 09/08/2012, 7:10 AM

## 2012-09-08 NOTE — Progress Notes (Signed)
Report called and pt transferred to room 2616. Metoprolol tablet not yet here from pharmacy, discussed noon medications with receiving RN (have not been administered). Pt stable transferred on monitor with RN to 2616, receiving RN into room.

## 2012-09-08 NOTE — Progress Notes (Signed)
[facility]  DAILY PROGRESS NOTE   Subjective:  No events overnight. PVC's noted. No chest pain or worsening shortness of breath.  Objective:  Temp:  [97.4 F (36.3 C)-98.7 F (37.1 C)] 97.6 F (36.4 C) (02/14 0738) Pulse Rate:  [66-90] 80 (02/14 0800) Resp:  [15-28] 19 (02/14 0800) BP: (96-134)/(41-83) 134/56 mmHg (02/14 0800) SpO2:  [92 %-100 %] 97 % (02/14 0800) FiO2 (%):  [40 %-60 %] 40 % (02/13 1656) Weight:  [102.014 kg (224 lb 14.4 oz)] 102.014 kg (224 lb 14.4 oz) (02/14 0500) Weight change: -0.045 kg (-1.6 oz)  Intake/Output from previous day: 02/13 0701 - 02/14 0700 In: 2883 [P.O.:50; I.V.:2473; IV Piggyback:300] Out: 1370 [Urine:1210; Stool:60; Blood:100]  Intake/Output from this shift: Total I/O In: 100 [I.V.:100] Out: -   Medications: Current Facility-Administered Medications  Medication Dose Route Frequency Provider Last Rate Last Dose  . 0.45 % NaCl with KCl 20 mEq / L infusion   Intravenous Continuous Kandis Cocking, MD 100 mL/hr at 09/08/12 0913    . acetaminophen (OFIRMEV) IV 1,000 mg  1,000 mg Intravenous Q6H Kandis Cocking, MD   1,000 mg at 09/08/12 0732  . doxazosin (CARDURA) tablet 8 mg  8 mg Oral Daily Eda Paschal [place], [place]   8 mg at 09/08/12 4098  . furosemide (LASIX) tablet 40 mg  40 mg Oral BID Marykay Lex, MD   40 mg at 09/08/12 0820  . heparin injection 5,000 Units  5,000 Units Subcutaneous Q8H Kandis Cocking, MD   5,000 Units at 09/08/12 [phone]  . HYDROmorphone (DILAUDID) injection 1-2 mg  1-2 mg Intravenous Q1H PRN Atilano Ina, MD,FACS   1 mg at 09/08/12 0913  . insulin aspart (novoLOG) injection 0-9 Units  0-9 Units Subcutaneous Q4H Christina P Rama, MD      . isosorbide dinitrate (ISORDIL) tablet 30 mg  30 mg Oral BID Atilano Ina, MD,FACS   30 mg at 09/08/12 4782  . lactated ringers infusion   Intravenous Continuous Kandis Cocking, MD 50 mL/hr at 09/07/12 1000    . levofloxacin (LEVAQUIN) IVPB 750 mg  750 mg  Intravenous Q24H Ardyth Gal, MD   750 mg at 09/06/12 2022  . loratadine (CLARITIN) tablet 10 mg  10 mg Oral Daily PRN Andrena Mews, DO      . metoprolol (LOPRESSOR) injection 5 mg  5 mg Intravenous Q6H Atilano Ina, MD,FACS      . metroNIDAZOLE (FLAGYL) IVPB 500 mg  500 mg Intravenous Q8H Maryruth Bun Rama, MD   500 mg at 09/08/12 0944  . nitroGLYCERIN (NITROSTAT) SL tablet 0.4 mg  0.4 mg Sublingual Q5 min PRN Maryruth Bun Rama, MD      . ondansetron (ZOFRAN) tablet 4 mg  4 mg Oral Q6H PRN Kandis Cocking, MD       Or  . ondansetron [facility]) injection 4 mg  4 mg Intravenous Q6H PRN Kandis Cocking, MD      . psyllium (HYDROCIL/METAMUCIL) packet 1 packet  1 packet Oral BID Andrena Mews, DO   1 packet at 09/06/12 2122  . sodium chloride 0.9 % injection 3 mL  3 mL Intravenous Q12H Maryruth Bun Rama, MD   3 mL at 09/07/12 2354   Facility-Administered Medications Ordered in Other Encounters  Medication Dose Route Frequency Provider Last Rate Last Dose  . acetaminophen (OFIRMEV) IV    PRN Tyrone Nine, CRNA   1,000 mg at  09/07/12 1508  . albuterol (PROVENTIL HFA;VENTOLIN HFA) 108 (90 BASE) MCG/ACT inhaler    PRN Sande Brothers, CRNA   4 puff at 09/07/12 1440  . dextrose 5 % solution    Continuous PRN Tyrone Nine, CRNA      . etomidate (AMIDATE) injection    PRN Sande Brothers, CRNA   16 mg at 09/07/12 1253  . fentaNYL (SUBLIMAZE) injection    PRN Sande Brothers, CRNA   100 mcg at 09/07/12 1624  . glycopyrrolate (ROBINUL) injection    PRN Tyrone Nine, CRNA   0.4 mg at 09/07/12 1605  . lactated ringers infusion    Continuous PRN Sande Brothers, CRNA      . lidocaine (cardiac) 100 mg/30ml (XYLOCAINE) 20 MG/ML injection 2%    PRN Sande Brothers, CRNA   80 mg at 09/07/12 1253  . Lidocaine HCl 4 % topical solution    PRN Sande Brothers, CRNA   4 mL at 09/07/12 1254  . midazolam (VERSED) 5 MG/5ML injection    PRN Sande Brothers, CRNA   1 mg at  09/07/12 1231  . neostigmine (PROSTIGMINE) injection   Intravenous PRN Tyrone Nine, CRNA   3 mg at 09/07/12 1605  . ondansetron (ZOFRAN) injection    PRN Tyrone Nine, CRNA   4 mg at 09/07/12 1605  . phenylephrine (NEO-SYNEPHRINE) 0.04 mg/mL in sodium chloride 0.9 % 250 mL infusion  10 mg  Continuous PRN Sande Brothers, CRNA   75 mcg/min at 09/07/12 1515  . rocuronium [facility]) injection    PRN Sande Brothers, CRNA   10 mg at 09/07/12 1423  . succinylcholine (ANECTINE) injection    PRN Sande Brothers, CRNA   100 mg at 09/07/12 1253    Physical Exam: General appearance: alert and no distress Neck: no adenopathy, no carotid bruit, no JVD, supple, symmetrical, trachea midline and thyroid not enlarged, symmetric, no tenderness/mass/nodules Lungs: rhonchi bilaterally Heart: regular rate and rhythm, S1, S2 normal, no murmur, click, rub or gallop Abdomen: soft, mild tenderness to palpation Extremities: extremities normal, atraumatic, no cyanosis or edema Pulses: 2+ and symmetric  Lab Results: Results for orders placed during the hospital encounter of 09/04/12 (from the past 48 hour(s))  CULTURE, BLOOD (ROUTINE X 2)     Status: None   Collection Time    09/06/12 10:02 AM      Result Value Range   Specimen Description BLOOD LEFT WRIST     Special Requests BOTTLES DRAWN AEROBIC AND ANAEROBIC 10CC     Culture  Setup Time 09/06/2012 15:48     Culture       Value:        BLOOD CULTURE RECEIVED NO GROWTH TO DATE CULTURE WILL BE HELD FOR 5 DAYS BEFORE ISSUING A FINAL NEGATIVE REPORT   Report Status PENDING    GLUCOSE, CAPILLARY     Status: Abnormal   Collection Time    09/06/12 12:18 PM      Result Value Range   Glucose-Capillary 120 (*) 70 - 99 mg/dL  CULTURE, BLOOD (ROUTINE X 2)     Status: None   Collection Time    09/06/12  3:10 PM      Result Value Range   Specimen Description BLOOD LEFT HAND     Special Requests BOTTLES DRAWN AEROBIC ONLY 10CC     Culture  Setup  Time 09/06/2012 23:30     Culture  Value:        BLOOD CULTURE RECEIVED NO GROWTH TO DATE CULTURE WILL BE HELD FOR 5 DAYS BEFORE ISSUING A FINAL NEGATIVE REPORT   Report Status PENDING    CEA     Status: Abnormal   Collection Time    09/06/12  3:22 PM      Result Value Range   CEA 59.3 (*) 0.0 - 5.0 ng/mL  CULTURE, BLOOD (ROUTINE X 2)     Status: None   Collection Time    09/06/12  3:22 PM      Result Value Range   Specimen Description BLOOD LEFT ARM     Special Requests BOTTLES DRAWN AEROBIC ONLY 10CC     Culture  Setup Time 09/06/2012 23:30     Culture       Value:        BLOOD CULTURE RECEIVED NO GROWTH TO DATE CULTURE WILL BE HELD FOR 5 DAYS BEFORE ISSUING A FINAL NEGATIVE REPORT   Report Status PENDING    GLUCOSE, CAPILLARY     Status: Abnormal   Collection Time    09/06/12  4:44 PM      Result Value Range   Glucose-Capillary 133 (*) 70 - 99 mg/dL  GLUCOSE, CAPILLARY     Status: Abnormal   Collection Time    09/06/12  8:19 PM      Result Value Range   Glucose-Capillary 134 (*) 70 - 99 mg/dL  GLUCOSE, CAPILLARY     Status: Abnormal   Collection Time    09/07/12  1:19 AM      Result Value Range   Glucose-Capillary 130 (*) 70 - 99 mg/dL  GLUCOSE, CAPILLARY     Status: Abnormal   Collection Time    09/07/12  4:59 AM      Result Value Range   Glucose-Capillary 134 (*) 70 - 99 mg/dL  CBC     Status: Abnormal   Collection Time    09/07/12  8:27 AM      Result Value Range   WBC 11.5 (*) 4.0 - 10.5 K/uL   RBC 3.75 (*) 4.22 - 5.81 MIL/uL   Hemoglobin 10.7 (*) 13.0 - 17.0 g/dL   HCT 78.2 (*) 95.6 - 21.3 %   MCV 85.3  78.0 - 100.0 fL   MCH 28.5  26.0 - 34.0 pg   MCHC 33.4  30.0 - 36.0 g/dL   RDW 08.6  57.8 - 46.9 %   Platelets 184  150 - 400 K/uL  BASIC METABOLIC PANEL     Status: Abnormal   Collection Time    09/07/12  8:27 AM      Result Value Range   Sodium 138  135 - 145 mEq/L   Potassium 3.5  3.5 - 5.1 mEq/L   Chloride 103  96 - 112 mEq/L   CO2 22  19 -  32 mEq/L   Glucose, Bld 136 (*) 70 - 99 mg/dL   BUN 22  6 - 23 mg/dL   Creatinine, Ser 6.29  0.50 - 1.35 mg/dL   Calcium 8.4  8.4 - 52.8 mg/dL   GFR calc non Af Amer 76 (*) >90 mL/min   GFR calc Af Amer 88 (*) >90 mL/min   Comment:            The eGFR has been calculated     using the CKD EPI equation.     This calculation has not been     validated in  all clinical     situations.     eGFR's persistently     <90 mL/min signify     possible Chronic Kidney Disease.  TYPE AND SCREEN     Status: None   Collection Time    09/07/12 10:25 AM      Result Value Range   ABO/RH(D) AB POS     Antibody Screen NEG     Sample Expiration 09/10/2012    ABO/RH     Status: None   Collection Time    09/07/12 10:25 AM      Result Value Range   ABO/RH(D) AB POS    MRSA PCR SCREENING     Status: None   Collection Time    09/07/12  8:11 PM      Result Value Range   MRSA by PCR NEGATIVE  NEGATIVE   Comment:            The GeneXpert MRSA Assay (FDA     approved for NASAL specimens     only), is one component of a     comprehensive MRSA colonization     surveillance program. It is not     intended to diagnose MRSA     infection nor to guide or     monitor treatment for     MRSA infections.  GLUCOSE, CAPILLARY     Status: Abnormal   Collection Time    09/07/12  8:33 PM      Result Value Range   Glucose-Capillary 126 (*) 70 - 99 mg/dL  URINALYSIS, ROUTINE W REFLEX MICROSCOPIC     Status: Abnormal   Collection Time    09/07/12 10:01 PM      Result Value Range   Color, Urine BROWN (*) YELLOW   Comment: BIOCHEMICALS MAY BE AFFECTED BY COLOR   APPearance TURBID (*) CLEAR   Specific Gravity, Urine 1.023  1.005 - 1.030   pH 5.5  5.0 - 8.0   Glucose, UA NEGATIVE  NEGATIVE mg/dL   Hgb urine dipstick LARGE (*) NEGATIVE   Bilirubin Urine LARGE (*) NEGATIVE   Ketones, ur 15 (*) NEGATIVE mg/dL   Protein, ur 161 (*) NEGATIVE mg/dL   Urobilinogen, UA 1.0  0.0 - 1.0 mg/dL   Nitrite NEGATIVE   NEGATIVE   Leukocytes, UA MODERATE (*) NEGATIVE  URINE MICROSCOPIC-ADD ON     Status: None   Collection Time    09/07/12 10:01 PM      Result Value Range   WBC, UA 7-10  <3 WBC/hpf   RBC / HPF TOO NUMEROUS TO COUNT  <3 RBC/hpf   Bacteria, UA RARE  RARE   Urine-Other LESS THAN 10 mL OF URINE SUBMITTED    GLUCOSE, CAPILLARY     Status: Abnormal   Collection Time    09/07/12 11:28 PM      Result Value Range   Glucose-Capillary 117 (*) 70 - 99 mg/dL  CBC     Status: Abnormal   Collection Time    09/08/12  3:15 AM      Result Value Range   WBC 9.2  4.0 - 10.5 K/uL   RBC 3.62 (*) 4.22 - 5.81 MIL/uL   Hemoglobin 10.6 (*) 13.0 - 17.0 g/dL   HCT 09.6 (*) 04.5 - 40.9 %   MCV 85.4  78.0 - 100.0 fL   MCH 29.3  26.0 - 34.0 pg   MCHC 34.3  30.0 - 36.0 g/dL   RDW 81.1  91.4 -  15.5 %   Platelets 175  150 - 400 K/uL  BASIC METABOLIC PANEL     Status: Abnormal   Collection Time    09/08/12  3:15 AM      Result Value Range   Sodium 137  135 - 145 mEq/L   Potassium 3.7  3.5 - 5.1 mEq/L   Chloride 104  96 - 112 mEq/L   CO2 22  19 - 32 mEq/L   Glucose, Bld 112 (*) 70 - 99 mg/dL   BUN 17  6 - 23 mg/dL   Creatinine, Ser 0.86  0.50 - 1.35 mg/dL   Calcium 8.1 (*) 8.4 - 10.5 mg/dL   GFR calc non Af Amer 84 (*) >90 mL/min   GFR calc Af Amer >90  >90 mL/min   Comment:            The eGFR has been calculated     using the CKD EPI equation.     This calculation has not been     validated in all clinical     situations.     eGFR's persistently     <90 mL/min signify     possible Chronic Kidney Disease.  GLUCOSE, CAPILLARY     Status: Abnormal   Collection Time    09/08/12  4:27 AM      Result Value Range   Glucose-Capillary 116 (*) 70 - 99 mg/dL  GLUCOSE, CAPILLARY     Status: Abnormal   Collection Time    09/08/12  7:36 AM      Result Value Range   Glucose-Capillary 114 (*) 70 - 99 mg/dL    Imaging: Dg Chest 2 View  09/06/2012  *RADIOLOGY REPORT*  Clinical Data: Fever and chills.   Shortness of breath.  CHEST - 2 VIEW  Comparison: None.  Findings: The heart size is exaggerated by low lung volumes.  Mild bibasilar atelectasis is present.  Lower lobe airspace disease is suggested on the lateral view.  This obscured by the low lung volumes on the PA view.  No additional airspace consolidation is present in the upper lung fields.  The AP view is moderately lordotic.  IMPRESSION:  1.  Suspected lower lobe airspace disease may represent early pneumonia. 2.  Low lung volumes and basilar atelectasis. 3.  Mild cardiomegaly.   Original Report Authenticated By: Marin Roberts, M.D.    Dg Chest Port 1 View  09/07/2012  *RADIOLOGY REPORT*  Clinical Data: Post port insertion  PORTABLE CHEST - 1 VIEW  Comparison: 09/06/2012.  Findings: Right port has been placed.  The tip appears be at the level of the cavoatrial junction and possibly proximal right atrium.  This can be assessed on follow-up two-view exam when the patient is able.  No gross pneumothorax.  Cardiomegaly.  Central pulmonary vascular congestion/prominence.  Left base subsegmental atelectasis.  Elevated hemidiaphragms more notable on the right secondary to poor inspiration.  Calcified aorta with mild prominence of the mediastinum.  IMPRESSION: Right port has been placed.  The tip appears be at the level of the cavoatrial junction and possibly proximal right atrium.  This can be assessed on follow-up two-view exam when the patient is able.  This is a call report.   Original Report Authenticated By: Lacy Duverney, M.D.    Dg Fluoro Guide Cv Line-no Report  09/07/2012  CLINICAL DATA: port a cath   FLOURO GUIDE CV LINE  Fluoroscopy was utilized by the requesting physician.  No radiographic  interpretation.  Assessment:  1. Principal Problem: 2.   Nausea with vomiting and diarrhea 3. Active Problems: 4.   DIABETES MELLITUS, CONTROLLED, WITHOUT COMPLICATIONS 5.   HYPERLIPIDEMIA 6.   Gout, unspecified 7.   OBESITY, NOS 8.    HYPERTENSION, BENIGN SYSTEMIC 9.   HYPERTENSION 10.   Ischemic cardiomyopathy, EF 30% Myoview 3/12 11.   Colonic mass 12.   Elevated lactic acid level 13.   Elevated lipase 14.   Leukocytosis, unspecified 15.   Prostate enlargement 16.   CAD, Ant MI 1990- total LAD with collaterals, Myoview low risk 3/12 17.   Encounter for pre-operative cardiovascular clearance 18.   Heart murmur 19.   Plan:  1. Stable from a cardiovascular standpoint. Tolerated surgery well. No signs of decompensated heart failure. I will restart po metoprolol today at 1/2 dose due to some nocturnal bradycardia. Will contact him for follow-up in our office with Dr. Herbie Baltimore.  Will sign off. Call with questions.  Time Spent Directly with Patient:  15 minutes  Length of Stay:  LOS: 4 days   Chrystie Nose, MD, [facility] Attending Cardiologist [facility]  Cordie Buening C 09/08/2012, 9:59 AM

## 2012-09-08 NOTE — Progress Notes (Signed)
Discussed pt low urine output with MD at bedside.

## 2012-09-08 NOTE — Progress Notes (Signed)
Patient ID: Todd [name], male   DOB: [birth year], 79 y.o.   MRN: [id number] 1 Day Post-Op  Subjective: Pt feels ok, just pain with movement.  Already sitting up in a chair.  No nausea with water and ice chips.  Objective: Vital signs in last 24 hours: Temp:  [97.4 F (36.3 C)-98.7 F (37.1 C)] 97.6 F (36.4 C) (02/14 0738) Pulse Rate:  [66-90] 90 (02/14 0700) Resp:  [15-28] 19 (02/14 0700) BP: (96-134)/(41-83) 124/83 mmHg (02/14 0700) SpO2:  [92 %-100 %] 96 % (02/14 0700) FiO2 (%):  [40 %-60 %] 40 % (02/13 1656) Weight:  [224 lb 14.4 oz (102.014 kg)] 224 lb 14.4 oz (102.014 kg) (02/14 0500) Last BM Date: 09/06/12  Intake/Output from previous day: 02/13 0701 - 02/14 0700 In: 2883 [P.O.:50; I.V.:2473; IV Piggyback:300] Out: 1370 [Urine:1210; Stool:60; Blood:100] Intake/Output this shift:    PE: Abd: soft, appropriately tender, few BS, ND, incisions c/d/i.  Ostomy with some liquid stool (minimal).  Stoma is pink and viable. HEart: regular Lungs: CTAB Ext: trace LE edema  Lab Results:   Recent Labs  09/07/12 0827 09/08/12 0315  WBC 11.5* 9.2  HGB 10.7* 10.6*  HCT 32.0* 30.9*  PLT 184 175   BMET  Recent Labs  09/07/12 0827 09/08/12 0315  NA 138 137  K 3.5 3.7  CL 103 104  CO2 22 22  GLUCOSE 136* 112*  BUN 22 17  CREATININE 0.97 0.76  CALCIUM 8.4 8.1*   PT/INR No results found for this basename: LABPROT, INR,  in the last 72 hours CMP     Component Value Date/Time   NA 137 09/08/2012 0315   K 3.7 09/08/2012 0315   CL 104 09/08/2012 0315   CO2 22 09/08/2012 0315   GLUCOSE 112* 09/08/2012 0315   BUN 17 09/08/2012 0315   CREATININE 0.76 09/08/2012 0315   CREATININE 0.76 06/27/2012 0856   CALCIUM 8.1* 09/08/2012 0315   PROT 8.0 09/04/2012 2215   ALBUMIN 4.0 09/04/2012 2215   AST 39* 09/04/2012 2215   ALT 28 09/04/2012 2215   ALKPHOS 173* 09/04/2012 2215   BILITOT 0.4 09/04/2012 2215   GFRNONAA 84* 09/08/2012 0315   GFRAA >90 09/08/2012 0315   Lipase     Component  Value Date/Time   LIPASE 104* 09/04/2012 2215       Studies/Results: Dg Chest 2 View  09/06/2012  *RADIOLOGY REPORT*  Clinical Data: Fever and chills.  Shortness of breath.  CHEST - 2 VIEW  Comparison: None.  Findings: The heart size is exaggerated by low lung volumes.  Mild bibasilar atelectasis is present.  Lower lobe airspace disease is suggested on the lateral view.  This obscured by the low lung volumes on the PA view.  No additional airspace consolidation is present in the upper lung fields.  The AP view is moderately lordotic.  IMPRESSION:  1.  Suspected lower lobe airspace disease may represent early pneumonia. 2.  Low lung volumes and basilar atelectasis. 3.  Mild cardiomegaly.   Original Report Authenticated By: Marin Roberts, M.D.    Dg Chest Port 1 View  09/07/2012  *RADIOLOGY REPORT*  Clinical Data: Post port insertion  PORTABLE CHEST - 1 VIEW  Comparison: 09/06/2012.  Findings: Right port has been placed.  The tip appears be at the level of the cavoatrial junction and possibly proximal right atrium.  This can be assessed on follow-up two-view exam when the patient is able.  No gross pneumothorax.  Cardiomegaly.  Central pulmonary vascular  congestion/prominence.  Left base subsegmental atelectasis.  Elevated hemidiaphragms more notable on the right secondary to poor inspiration.  Calcified aorta with mild prominence of the mediastinum.  IMPRESSION: Right port has been placed.  The tip appears be at the level of the cavoatrial junction and possibly proximal right atrium.  This can be assessed on follow-up two-view exam when the patient is able.  This is a call report.   Original Report Authenticated By: Lacy Duverney, M.D.    Dg Fluoro Guide Cv Line-no Report  09/07/2012  CLINICAL DATA: port a cath   FLOURO GUIDE CV LINE  Fluoroscopy was utilized by the requesting physician.  No radiographic  interpretation.      Anti-infectives: Anti-infectives   Start     Dose/Rate Route  Frequency Ordered Stop   09/06/12 1930  levofloxacin (LEVAQUIN) IVPB 750 mg     750 mg 100 mL/hr over 90 Minutes Intravenous Every 24 hours 09/06/12 1812     09/05/12 1200  ciprofloxacin (CIPRO) IVPB 400 mg  Status:  Discontinued     400 mg 200 mL/hr over 60 Minutes Intravenous Every 12 hours 09/05/12 0404 09/06/12 1812   09/05/12 1000  metroNIDAZOLE (FLAGYL) IVPB 500 mg     500 mg 100 mL/hr over 60 Minutes Intravenous Every 8 hours 09/05/12 0404     09/05/12 0115  ciprofloxacin (CIPRO) IVPB 400 mg     400 mg 200 mL/hr over 60 Minutes Intravenous  Once 09/05/12 0101 09/05/12 0256   09/05/12 0115  metroNIDAZOLE (FLAGYL) IVPB 500 mg     500 mg 100 mL/hr over 60 Minutes Intravenous  Once 09/05/12 0101 09/05/12 0225       Assessment/Plan  1. S/p lap assisted sigmoid colectomy/colostomy 2. Placement of PAC  3. Foley trauma with placement of a coud cath.  (18 [place]) 4. Multiple cardiac issues  Plan: 1. May have some sips of clears, no tray 2. Ok to transfer to SDU from our standpoint 3. Agree with lasix, UOP is low.  Foley can come out tomorrow, but if UOP is still low, then would recommend leaving this in for the time being to monitor UOP. 4. OOB to chair and ambulate in halls TID 5. pulm toilet.  LOS: 4 days    OSBORNE,KELLY E 09/08/2012, 8:12 AM Pager: [phone]  Agree with above. Doing well.  Needs to ambulate more.  Ovidio Kin, MD, [facility] Surgery Pager: [phone] Office phone:  [phone]

## 2012-09-08 NOTE — Anesthesia Postprocedure Evaluation (Signed)
  Anesthesia Post-op Note  [name]: Todd [name]  Procedure(s) Performed: Procedure(s): LAPAROSCOPIC ASSISTED SIGMOID COLON RESECTION END OSTOMY (N/A) Placement of power port (N/A)  [name] Location: Nursing Unit  Anesthesia Type:General  Level of Consciousness: awake, alert  and oriented  Airway and Oxygen Therapy: [name] Spontanous Breathing and [name] connected to nasal cannula oxygen  Post-op Pain: none  Post-op Assessment: Post-op Vital signs reviewed, [name]'s Cardiovascular Status Stable, Respiratory Function Stable, Patent Airway, No signs of Nausea or vomiting, Adequate PO intake, Pain level controlled and No headache  Post-op Vital Signs: Reviewed and stable  Complications: No apparent anesthesia complications

## 2012-09-08 NOTE — Progress Notes (Signed)
[facility] Attending Daily Note: Denny Levy MD [phone] pager office [phone] I  have seen and examined this patient, reviewed their chart. I have discussed this patient with the resident. I agree with the resident's findings, assessment and care plan.Surgery went well.

## 2012-09-08 NOTE — Progress Notes (Signed)
Patient ID: Todd [name], male   DOB: [birth year], 79 y.o.   MRN: [id number]  I was called by Dr. Ovidio Kin last night due to concerns that Mr. Presutti's catheter was not in place.  He removed the 14 Fr catheter that had been placed in the OR and was easily able to place an 18 Fr coude catheter with return of mostly clear urine.    This morning, his catheter appears to be in appropriate position and draining clear urine although overall UOP has been low. His catheter can be removed tomorrow from a urologic standpoint.  He should followup with Dr. Patsi Sears for routine follow up of his BPH.  He should continue the same BPH medication that he was on prior to admission upon discharge.

## 2012-09-09 DIAGNOSIS — N400 Enlarged prostate without lower urinary tract symptoms: Secondary | ICD-10-CM

## 2012-09-09 DIAGNOSIS — R112 Nausea with vomiting, unspecified: Secondary | ICD-10-CM

## 2012-09-09 DIAGNOSIS — I10 Essential (primary) hypertension: Secondary | ICD-10-CM

## 2012-09-09 DIAGNOSIS — I2589 Other forms of chronic ischemic heart disease: Secondary | ICD-10-CM

## 2012-09-09 DIAGNOSIS — R05 Cough: Secondary | ICD-10-CM

## 2012-09-09 LAB — BASIC METABOLIC PANEL
CO2: 25 mEq/L (ref 19–32)
Calcium: 8.1 mg/dL — ABNORMAL LOW (ref 8.4–10.5)
Creatinine, Ser: 0.79 mg/dL (ref 0.50–1.35)
Glucose, Bld: 123 mg/dL — ABNORMAL HIGH (ref 70–99)
Sodium: 136 mEq/L (ref 135–145)

## 2012-09-09 LAB — CBC
Hemoglobin: 10.1 g/dL — ABNORMAL LOW (ref 13.0–17.0)
MCH: 28.1 pg (ref 26.0–34.0)
MCV: 84.4 fL (ref 78.0–100.0)
RBC: 3.6 MIL/uL — ABNORMAL LOW (ref 4.22–5.81)

## 2012-09-09 LAB — URINE CULTURE: Culture: NO GROWTH

## 2012-09-09 LAB — GLUCOSE, CAPILLARY
Glucose-Capillary: 103 mg/dL — ABNORMAL HIGH (ref 70–99)
Glucose-Capillary: 133 mg/dL — ABNORMAL HIGH (ref 70–99)

## 2012-09-09 MED ORDER — SENNA 8.6 MG PO TABS
2.0000 | ORAL_TABLET | Freq: Every day | ORAL | Status: DC
  Administered 2012-09-09 – 2012-09-14 (×5): 17.2 mg via ORAL
  Filled 2012-09-09 (×6): qty 2

## 2012-09-09 MED ORDER — DILTIAZEM HCL 100 MG IV SOLR
5.0000 mg/h | INTRAVENOUS | Status: DC
  Administered 2012-09-09: 5 mg/h via INTRAVENOUS
  Administered 2012-09-10: 15 mg/h via INTRAVENOUS
  Administered 2012-09-10 – 2012-09-11 (×2): 5 mg/h via INTRAVENOUS
  Filled 2012-09-09 (×5): qty 100

## 2012-09-09 MED ORDER — TRAMADOL HCL 50 MG PO TABS
50.0000 mg | ORAL_TABLET | Freq: Four times a day (QID) | ORAL | Status: DC
  Administered 2012-09-09 – 2012-09-14 (×20): 50 mg via ORAL
  Filled 2012-09-09 (×27): qty 1

## 2012-09-09 MED ORDER — MORPHINE SULFATE 2 MG/ML IJ SOLN
2.0000 mg | INTRAMUSCULAR | Status: DC | PRN
  Administered 2012-09-09 – 2012-09-11 (×6): 2 mg via INTRAVENOUS
  Filled 2012-09-09 (×6): qty 1

## 2012-09-09 NOTE — Discharge Summary (Signed)
Family Medicine Teaching [facility] Discharge Summary  Patient name: Todd [name] Medical record number: [id number] Date of birth: [birth year] Age: 79 y.o. Gender: male Date of Admission: 09/04/2012  Date of Discharge: 09/14/12 Admitting Physician: Maryruth Bun Rama, MD  Primary Care Provider: [name], MD  Indication for Hospitalization: Nausea, vomiting Discharge Diagnoses:  1. Invasive adenocarcinoma of the colon 2. Pneumonia 3. Afib with RVR 4. Anemia 5. UTI 6. CHF Chronic systolic and diastolic 7. HTN 8. CAD with Hx of MI 9. HLD 10. DM2 11. BPH  Consultations: General Surgery Herbie Baltimore), GI [facility] Sprang- Dr. Madilyn Fireman), Cardiology [facility], Dr. Herbie Baltimore)  Significant Labs and Imaging:   Recent Labs Lab 09/04/12 2215 09/07/12 0827 09/08/12 0315 09/09/12 0500  NA 139 138 137 136  K 3.8 3.5 3.7 3.2*  CL 99 103 104 100  CO2 23 22 22 25   GLUCOSE 219* 136* 112* 123*  BUN 31* 22 17 12   CREATININE 1.10 0.97 0.76 0.79  CALCIUM 10.4 8.4 8.1* 8.1*    Recent Labs Lab 09/07/12 0827 09/08/12 0315 09/09/12 0500  HGB 10.7* 10.6* 10.1*  HCT 32.0* 30.9* 30.4*  WBC 11.5* 9.2 11.2*  PLT 184 175 211     Recent Labs Lab 09/04/12 2215  TROPONINI <0.30   Procedures: Laparoscopic assisted sigmoid colon resection, L ostomy, power Port placement 09/07/2012 by Dr. Ezzard Standing.   Physical Exam Filed Vitals:   09/11/12 0600 09/11/12 0700 09/11/12 0817 09/11/12 1112  BP: 131/60  140/58   Pulse: 65 76 75   Temp:   97.9 F (36.6 C)   TempSrc:   Oral   Resp: 22 23 21    Height:      Weight:      SpO2: 97% 96% 99% 95%   Gen: NAD  HEENT: NCAT, EOMI, PERRL  CV: RRR, 2-3/6 systolic murmur  Resp: CTA  Abd: ostomy in place. NABS  Ext: 1+ edema, warm  Neuro: Alert and oriented, No gross deficits   Brief Hospital Course:  79 y/o male here with acute onset nausea, vomiting, and diarrhea, found to have colonic mass on CT scan now resected and known to be stage IV invasive  adenocarcinoma.   1) Invasive Adenocarcinoma Pt presented with sudden onset nausea and vomiting and had a CT scan consistent with metastatic colon cancer to the liver. He was admitted and GI was consulted who performed a flex sig finding a circumferential nearly obstructing mass 35 cm in. Tissue samples were obtained and showed invasive carcinoma. General surgery and oncology were consulted and agreed that resection would be reasonable so surgery proceeded with the sigmoid colectomy and created a new ostomy. Additionally he had a power port placed at the time of surgery. He was kept NPO until his stoma began to produce output at which time his diet was slowly advanced as tolerated. He was initially started on Cipro and flagyl to cover for bacterial gastritis which was discontinued after 2 and 4 days treatment respectively.  Per Oncology, a CEA was obtained and was 59.3.  Surgery continued to follow his ostomy site and his diet was advanced slowly until patient could tolerate solids well.  Oncology recommended follow up in the outpatient setting after tissue biopsy was sent for genetic testing.  PT/OT both recommended home health and PT/OT at home which were ordered along with mutliple supplies to help the patient at home.  Ostomy nurse helped patient with his ostomy bag prior to d/c.  Pt was stable at d/c and had minimal incisional  pain, his ostomy site was dry, intact, non erythematous, and he was passing gas.   2) Atrial Fibrillation with RVR and PVC's- Pt developed Afib RVR on POD #2 that was controlled initially on a Diltiazem drip.  Pt converted back to NSR and cardiology was consulted to evaluate.  They recommended switching pt to oral Toprol XL 25-50 mg and pt remained in NSR with frequent PVCs.  Cardiology felt that due to his fall risk and comorbidities, pt was not a good candidate for anticoagulation.  Pt was in NSR at time of d/c and not c/o palpitations.    3) Normocytic Anemia  He had some  expected blood loss anemia after surgery. Additionally it should be noted that before surgery he was having BRB per rectum and consistently grossly bloody stools. His Hgb was 13. 6 on admission and stabilized around 10 after surgery.  Pt Hgb stable around 9 prior to d/c and was not c/o dizziness.   4) Pneumonia He had some fever, chills, and dyspnea on day 2 of asmission so a CXR was performed with findings consistent withpneumonia. He was started on IV levaquin (and cipro was discontinued) for a total of 5 days.  Pt leukocytosis trended down and did not have a WBC, fever, tachycardia, or tachypnea prior to d/c.   5. UTI -A UA on 2/13 showing Large LE, U cx with no growth.  Pt was started on Levaquin for PNA and covered this UTI.  No Sx prior to d/c.    6.CAD, CHF, Hx of MI in 1990.  He had an extensive cardiac jhistory so SE cards was consulted for pre-op evaluation by general surgery. They optimized meds, discusssed his risk with the pt and the family and they proceeded with the surgery. He was continued on IMDUR, metoprolol, ASA, and his statin.   7.HTN  His BP was initially low and his home meds were held. IMDUR and metoprolol were started by in conjunction with cardiology and his BP was well managed on these meds. He was continued on his HCTZ prior to d/c because the patient did not want to stop this.  However, his felodipine and benazepril were both stopped due to well controlled BP and concern for hypotension in the outpatient setting.   8. HLD  His home simvastatin was held.  Pt was restarted upon d/c.   9. DM2 - Pt last A1C was 6.2 and his CBGs were well controlled while being NPO.    10. BPH  His home dose of doxazosin was continued.   11. Hypokalemia - Pt developed hypokalemia on HD #3 with K+ down to 3.2 and his Mg was 1.6.  He was given three IV runs of KCl and a recheck of his K+ was 3.9.  Stable prior to d/c.   12. Gout - Pt with history of gout and c/o acute attack.  He had  been on diclofenac and colchcine for acute breakthroughs and did not want to change his regimen.  Pt also on HCTZ for his BP and was recommended he stop this but he did not want to do this.  He was given a three day burst of prednisone and was tapered over the following four days (50,50,50,40,30,20,10)   Discharge Medications:    Medication List    STOP taking these medications       benazepril 40 MG tablet  Commonly known as:  LOTENSIN     felodipine 5 MG 24 hr tablet  Commonly known as:  PLENDIL      TAKE these medications       aspirin 81 MG tablet  Take 81 mg by mouth daily.     colchicine 0.6 MG tablet  Take 0.6 mg by mouth daily as needed (for gout flares).     diclofenac 75 MG EC tablet  Commonly known as:  VOLTAREN  Take 75 mg by mouth 2 (two) times daily.     doxazosin 8 MG tablet  Commonly known as:  CARDURA  Take 8 mg by mouth daily.     feeding supplement Liqd  Take 237 mLs by mouth 2 (two) times daily between meals.     FISH OIL DOUBLE STRENGTH 1200 MG Caps  Take 1 capsule by mouth 2 (two) times daily.     gabapentin 300 MG capsule  Commonly known as:  NEURONTIN  Take 1 capsule (300 mg total) by mouth 3 (three) times daily.     hydrochlorothiazide 25 MG tablet  Commonly known as:  HYDRODIURIL  Take 1 tablet (25 mg total) by mouth daily.     isosorbide mononitrate 60 MG 24 hr tablet  Commonly known as:  IMDUR  Take 1 tablet (60 mg total) by mouth daily.     loratadine 10 MG tablet  Commonly known as:  CLARITIN  Take 10 mg by mouth daily.     metoprolol succinate 50 MG 24 hr tablet  Commonly known as:  TOPROL-XL  Take 50 mg by mouth daily. Take with or immediately following a meal.     multivitamin tablet  Take 1 tablet by mouth daily.     nitroGLYCERIN 0.4 MG SL tablet  Commonly known as:  NITROSTAT  Place 0.4 mg under the tongue every 5 (five) minutes as needed.     psyllium 58.6 % powder  Commonly known as:  METAMUCIL  Take 1 packet by  mouth 2 (two) times daily.     simvastatin 40 MG tablet  Commonly known as:  ZOCOR  Take 1 tablet (40 mg total) by mouth at bedtime.     ZOSTAVAX 95621 UNT/0.65ML injection  Generic drug:  zoster vaccine live (PF)  Inject into the skin once. Dispense to patient to be admin in doctors office       Issues for Follow Up:  1) Oncology with Dr. Clelia Croft for invasive adenocarcinoma 2) BP/Atrial Fibrillation - Two of his home BP medications (CCB and ACE) were stopped due to concern for hypotension.   3) Ostomy Bag/Output and care 4) Home Health/OT/PT   Outstanding Results: None   Discharge Instructions: Please refer to Patient Instructions section of EMR for full details.  Patient was counseled important signs and symptoms that should prompt return to medical care, changes in medications, dietary instructions, activity restrictions, and follow up appointments.   Follow-up Information   Follow up with HARDING,DAVID W, MD. (our office will call Monday with date and time of follow up appt.)    Contact information:   [address], [address] [place] [address] [phone]       Follow up with [name], MD. (Tuesday 2/25@ 845 AM)    Contact information:   [address] [place] [address] [phone]       Follow up with [facility], MD. Schedule an appointment as soon as possible for a visit in 1 week.   Contact information:   [address] [place] [address] [phone]  Discharge Condition: Stable  Todd [name] R. Paulina Fusi, DO of [facility] 09/14/2012, 9:41 AM

## 2012-09-09 NOTE — Progress Notes (Signed)
Family Medicine Teaching Service Daily Progress Note Service Page: [phone]  Subjective:  Has been up walking with PT today. Did well but tired now. Some nausea, no vomiting. Feels like morpine helped his pain more and would like to try tramadol again.   Objective: Temp:  [97.6 F (36.4 C)-98.8 F (37.1 C)] 98.6 F (37 C) (02/14 2345) Pulse Rate:  [75-109] 101 (02/14 2345) Resp:  [13-19] 15 (02/14 2345) BP: (125-155)/(53-88) 154/88 mmHg (02/14 2345) SpO2:  [94 %-97 %] 96 % (02/14 2345) Weight:  [233 lb 11 oz (106 kg)-235 lb 10.8 oz (106.9 kg)] 233 lb 11 oz (106 kg) (02/14 2345) Exam: Gen: NAD, alert, cooperative with exam, sitting in chair HEENT: NCAT, EOMI, PERRL CV: RRR, 2-3/6 systolic murmur Resp: Some scattered wheezes, no crackles. non-labored Abd: slightly tense, tender to palpation throughout., new ostomy in place. BS hypoactive Ext: 1+ edema, warm Neuro: Alert and oriented, No gross deficits  I have reviewed the patient's medications, labs, imaging, and diagnostic testing.  Notable results are summarized below.  CBC BMET   Recent Labs Lab 09/07/12 0827 09/08/12 0315 09/09/12 0500  WBC 11.5* 9.2 11.2*  HGB 10.7* 10.6* 10.1*  HCT 32.0* 30.9* 30.4*  PLT 184 175 211    Recent Labs Lab 09/07/12 0827 09/08/12 0315 09/09/12 0500  NA 138 137 136  K 3.5 3.7 3.2*  CL 103 104 100  CO2 22 22 25   BUN 22 17 12   CREATININE 0.97 0.76 0.79  GLUCOSE 136* 112* 123*  CALCIUM 8.4 8.1* 8.1*     Imaging/Diagnostic Tests: CT abd/Pelvis with 09/05/2012 IMPRESSION:  1. Suspect primary colonic malignancy at the mid sigmoid colon,  measuring 5.2 x 4.7 x 4.1 cm, with scattered small adjacent nodules  reflecting local spread of disease. No evidence for obstruction;  no perforation seen. Trace associated free fluid noted.  2. Mild diffuse wall thickening involving the proximal sigmoid  colon may reflect chronic inflammation due to the more distal mass;  the remaining colon is  unremarkable.  3. Multiple mildly heterogeneous slightly ill-defined masses  throughout the right hepatic lobe, measuring up to 11.5 cm,  compatible with metastatic disease.  4. 2.4 x 1.5 cm peripancreatic node demonstrates mild low  attenuation, likely reflecting metastatic disease.  5. 2.6 cm adrenal mass may reflect metastatic disease, though it  is nonspecific in appearance.  6. Focal left basilar airspace opacification may reflect  atelectasis or possibly pneumonia.  7. Diffuse coronary artery calcifications seen.  8. Bilateral renal cysts, particularly prominent on the left side.  9. Diffuse calcification along the abdominal aorta and its  branches.  10. Markedly enlarged prostate noted, measuring 7.7 cm in size.  11. Diffuse degenerative change noted along the lumbar spine.  CXR 09/06/2012 IMPRESSION:  1. Suspected lower lobe airspace disease may represent early  pneumonia.  2. Low lung volumes and basilar atelectasis.  3. Mild cardiomegaly.  CXR 09/08/2012 IMPRESSION:  Right port has been placed. The tip appears be at the level of the  cavoatrial junction and possibly proximal right atrium. This can  be assessed on follow-up two-view exam when the patient is able.  Plan: 79 y/o male here with acute onset nausea, vomiting, and diarrhea, found to have colonic mass on CT scan.   Colonic mass, now S/p sigmoid colectomy and power port placement day 1 - Likely metastsatic GI Ca, Biopsy results show invasive adenocarcinoma from tissue Bx on 2/11 - initially treated with cipro falgyl for 2 days, now narrowed  to levaquin - GI Consulting - appprecuiate reccs  - Flex sig on 09/05/2012- circumferential mass nearly obstructing the colon 35 cm in  - Cdiff negative, Stool culture pending - Surgery consulting - appreciate reccs and management - Oncology consulting- apprecfiate reccs  - CEA is 59.3, needs port for expected chemo, agree with surgery - PRN dilaudid for pain will change to  morphine, will add back tramadol scheduled  Anemia - Hgb 10.6-->10.1, 13.09 on admission  - some expected blood loss anemia and was previously having BRB per rectum before surgery.  - monitor,   Nausea - some overnight, possibly related to dilaudid (pt's explaanation), also will onsider ileus but has been tolerating sips and chips well until now - PRN zofran, change to morphine + scheduled tramadol  Pneumonia- CAP - Afebrile  - Normal work of breathinig - Levaquin day 4 - Blood cultures NGTD  UTI - UA on 2/13 showing Large LE, U cx with no growth - Foley now in place, on levaquin for PNA which will adequately cover UTI - Ok to dc foley per urology, I agree with Dr. Ezzard Standing with his only moderate UOP will keep it for one more day to watch I/O, esp considering that he would not like to have it replaced right away.   CAD, CHF, Hx of MI, CHF -Cardiology consulting- appreciate reccs.   - Continue IMDUR, metoprolol succ 25 daily  - TTE with 35-40% and grade 1 diastolic dysf  - Hold ASA and statin  HTN - BP: 154/88, pulse 101 (75-109) - Hold home: HCTZ, benzapril - IMDUR, BB started/addded per cards  HLD - hold simvastatin  DM2 - CBGs: 105-117 - A1C 6.2 - SSI, Q8 checks, still NPO except for chips and meds  BPH - Continue doxazosin  FEN/GI: 1/2NS at 100 cc/hr, LR at 50 ml/hr, liquids, will advance as tolerated PPx: SCDs Dispo: Home after surgical intervention, transfer to tele Code Status: DNR  Kevin Fenton, MD 09/09/2012, 7:12 AM

## 2012-09-09 NOTE — Progress Notes (Signed)
Pt's foley has been d/c, pt tolerated well.----Desirai Traxler, rn

## 2012-09-09 NOTE — Progress Notes (Signed)
FMTS Attending Daily Note: Sara Neal MD [phone] pager office [phone] I  have seen and examined this patient, reviewed their chart. I have discussed this patient with the resident. I agree with the resident's findings, assessment and care plan. 

## 2012-09-09 NOTE — Progress Notes (Signed)
Pt went into A-fib with RVR, HR in the 130s,140s and 150s, EKG was obtained which confirm A-FIB with RVR, teaching service MD paged who said to give 5mg  of metoprolol and he also came to see pt---Manas Hickling, RN

## 2012-09-09 NOTE — Progress Notes (Signed)
Interim note  Called by Rn to notify that Todd [name] is in Afib with RVR. An EKG just obtained confirms. Patient feels slightly flushed but denies chest pain, dyspnea, palpitations, or dizziness.   PE: G: NAD, Lying in bed, easily converant CV: irreg/irreg with good S1/S2 Resp: non labored, some coarse experiatory breath sounds.   A/P 79 y/o man s/p bowel resection to remove large GI tumor day 2 with new onset Afib with RVR.   EKG with Afib with RVR  - 5 mg IV metoprolol given approx 40 minutes ago and rate still 120+ - Will start Diltiazem drip and plan to consult SE cards Herbie Baltimore consulted earlier in the admission).   Kevin Fenton, MD [phone], 09/09/2012

## 2012-09-09 NOTE — Evaluation (Signed)
Physical Therapy Evaluation Patient Details Name: Todd [name] MRN: [id number] DOB: 09/20/1929 Today's Date: 09/09/2012 Time: 8295-6213 PT Time Calculation (min): 27 min  PT Assessment / Plan / Recommendation Clinical Impression  Pt admitted with N/V and found to have colon mass with mets to liver s/p colectomy/colostomy. Pt currently reports dizziness with movement without orthostasis and no nystagmus with visual tracking however pt consistently with loss of target with visual tracking. Pt will benefit from acute therapy to maximize transfers, gait, use of DME and safety to return pt to PLOF and  decrease burden of care. Pt encouraged to be OOB 3x/day and to perform bil LE HEP which he reports he was doing partially with dgtr who is an Event organiser.     PT Assessment  Patient needs continued PT services    Follow Up Recommendations  Home health PT    Does the patient have the potential to tolerate intense rehabilitation      Barriers to Discharge None      Equipment Recommendations  Rolling walker with 5" wheels;Other (comment) (3in1)    Recommendations for Other Services OT consult   Frequency Min 3X/week    Precautions / Restrictions Precautions Precautions: Fall Precaution Comments: ostomy   Pertinent Vitals/Pain No pain HR 108-120, sats 91-93% on RA BP 128/85 EOB 122/56 standing      Mobility  Bed Mobility Bed Mobility: Not assessed Details for Bed Mobility Assistance: pt EOB on arrival. Educated for rolling to side and side to sit  Transfers Transfers: Sit to Stand;Stand to Sit Sit to Stand: 4: Min assist;From chair/3-in-1;From bed Stand to Sit: 4: Min assist;To chair/3-in-1 Details for Transfer Assistance: cueing for hand placement, anterior weight shift and control of descent as pt has a tendency to plop Ambulation/Gait Ambulation/Gait Assistance: 4: Min guard Ambulation Distance (Feet): 40 Feet Assistive device: Rolling walker Ambulation/Gait  Assistance Details: cueing for posture and position in RW Gait Pattern: Step-through pattern;Decreased stride length Gait velocity: decreased Stairs: No    Exercises     PT Diagnosis: Difficulty walking  PT Problem List: Decreased activity tolerance;Decreased mobility;Decreased knowledge of use of DME PT Treatment Interventions: Gait training;DME instruction;Stair training;Functional mobility training;Therapeutic activities;Therapeutic exercise;Patient/family education   PT Goals Acute Rehab PT Goals PT Goal Formulation: With patient/family Time For Goal Achievement: 09/23/12 Potential to Achieve Goals: Good Pt will go Supine/Side to Sit: with supervision;with HOB 0 degrees PT Goal: Supine/Side to Sit - Progress: Goal set today Pt will go Sit to Supine/Side: with modified independence;with HOB 0 degrees PT Goal: Sit to Supine/Side - Progress: Goal set today Pt will go Sit to Stand: with supervision;with upper extremity assist PT Goal: Sit to Stand - Progress: Goal set today Pt will go Stand to Sit: with supervision;with upper extremity assist PT Goal: Stand to Sit - Progress: Goal set today Pt will Ambulate: >150 feet;with least restrictive assistive device;with supervision PT Goal: Ambulate - Progress: Goal set today Pt will Go Up / Down Stairs: 3-5 stairs;with supervision;with rail(s) PT Goal: Up/Down Stairs - Progress: Goal set today  Visit Information  Last PT Received On: 09/09/12 Assistance Needed: +1    Subjective Data  Subjective: I just feel woozy Patient Stated Goal: return home with wife   Prior Functioning  Home Living Lives With: Spouse Available Help at Discharge: Family;Available 24 hours/day Type of Home: House Home Access: Stairs to enter [employer] of Steps: 4 Entrance Stairs-Rails: Right Home Layout: One level Bathroom Shower/Tub: Health visitor: Standard Home Adaptive  Equipment: Straight cane Prior Function Level of  Independence: Independent Able to Take Stairs?: Yes Driving: Yes Vocation: Retired Comments: pt was actively going to water aerobics 3x/wk and using stationary bike 2x/wk Communication Communication: No difficulties    Cognition  Cognition Overall Cognitive Status: Appears within functional limits for tasks assessed/performed Arousal/Alertness: Awake/alert Orientation Level: Appears intact for tasks assessed Behavior During Session: [facility] for tasks performed    Extremity/Trunk Assessment Right Upper Extremity Assessment RUE ROM/Strength/Tone: [facility] for tasks assessed Left Upper Extremity Assessment LUE ROM/Strength/Tone: [facility] for tasks assessed Right Lower Extremity Assessment RLE ROM/Strength/Tone: [facility] for tasks assessed Left Lower Extremity Assessment LLE ROM/Strength/Tone: [facility] for tasks assessed Trunk Assessment Trunk Assessment: Normal   Balance    End of Session PT - End of Session Activity Tolerance: Patient limited by fatigue Patient left: in chair;with call bell/phone within reach;with family/visitor present  GP     [name] 09/09/2012, 8:15 AM  [name], PT [phone]

## 2012-09-09 NOTE — Progress Notes (Signed)
General Surgery Note  LOS: 5 days  POD -  2 Days Post-Op Room - 2616  Assessment/Plan: 1.  LAPAROSCOPIC ASSISTED SIGMOID COLON RESECTION, END Left OSTOMY, Hartmann's pouch, Placement of power port - D. Margarine Grosshans - 09/07/2012  For obstructing colon cancer.  No ostomy output.  Will keep NPO until bowel function.  Otherwise doing well.   2.  Traumatic hematuria - seen by Dr. Laverle Patter  BPH  Will d/c foley today. 3.  Metastatic disease to liver.  Seen by Dr. Earl Gala 4.  Diabetes mellitus 5.  Cardiomyopathy with CAD  Distant h/o anterior MI with 100% occluded LAD & associated Ischemic Cardiomyopathy EF ~35% with chronic mild dependent edema - followed by Dr. Ranae Palms 6.  DVT prophylaxis - SQ heparin 7.  On levaquin - not needed from my standpoint  Subjective:  Doing well, though a little nauseated.  Wife at bedside. Objective:   Filed Vitals:   09/09/12 0809  BP: 122/56  Pulse: 108  Temp:   Resp:      Intake/Output from previous day:  02/14 0701 - 02/15 0700 In: 2420 [P.O.:180; I.V.:1650; IV Piggyback:550] Out: 775 [Urine:775]  Intake/Output this shift:  Total I/O In: -  Out: 200 [Urine:200]   Physical Exam:   General: WN older WM who is alert and oriented. He is sitting in the chair.   HEENT: Normal. Pupils equal. .   Lungs: Clear.  IS about 1,000 cc.   Abdomen: Has bowel sounds.   Wound: Ostomy okay.  Dressing intact.   Neurologic:  Grossly intact to motor and sensory function.     Lab Results:    Recent Labs  09/08/12 0315 09/09/12 0500  WBC 9.2 11.2*  HGB 10.6* 10.1*  HCT 30.9* 30.4*  PLT 175 211    BMET   Recent Labs  09/08/12 0315 09/09/12 0500  NA 137 136  K 3.7 3.2*  CL 104 100  CO2 22 25  GLUCOSE 112* 123*  BUN 17 12  CREATININE 0.76 0.79  CALCIUM 8.1* 8.1*    PT/INR  No results found for this basename: LABPROT, INR,  in the last 72 hours  ABG  No results found for this basename: PHART, PCO2, PO2, HCO3,  in the last 72  hours   Studies/Results:  Dg Chest Port 1 View  09/07/2012  *RADIOLOGY REPORT*  Clinical Data: Post port insertion  PORTABLE CHEST - 1 VIEW  Comparison: 09/06/2012.  Findings: Right port has been placed.  The tip appears be at the level of the cavoatrial junction and possibly proximal right atrium.  This can be assessed on follow-up two-view exam when the patient is able.  No gross pneumothorax.  Cardiomegaly.  Central pulmonary vascular congestion/prominence.  Left base subsegmental atelectasis.  Elevated hemidiaphragms more notable on the right secondary to poor inspiration.  Calcified aorta with mild prominence of the mediastinum.  IMPRESSION: Right port has been placed.  The tip appears be at the level of the cavoatrial junction and possibly proximal right atrium.  This can be assessed on follow-up two-view exam when the patient is able.  This is a call report.   Original Report Authenticated By: Lacy Duverney, M.D.    Dg Fluoro Guide Cv Line-no Report  09/07/2012  CLINICAL DATA: port a cath   FLOURO GUIDE CV LINE  Fluoroscopy was utilized by the requesting physician.  No radiographic  interpretation.       Anti-infectives:   Anti-infectives   Start  Dose/Rate Route Frequency Ordered Stop   09/06/12 1930  levofloxacin (LEVAQUIN) IVPB 750 mg     750 mg 100 mL/hr over 90 Minutes Intravenous Every 24 hours 09/06/12 1812     09/05/12 1200  ciprofloxacin (CIPRO) IVPB 400 mg  Status:  Discontinued     400 mg 200 mL/hr over 60 Minutes Intravenous Every 12 hours 09/05/12 0404 09/06/12 1812   09/05/12 1000  metroNIDAZOLE (FLAGYL) IVPB 500 mg  Status:  Discontinued     500 mg 100 mL/hr over 60 Minutes Intravenous Every 8 hours 09/05/12 0404 09/08/12 1103   09/05/12 0115  ciprofloxacin (CIPRO) IVPB 400 mg     400 mg 200 mL/hr over 60 Minutes Intravenous  Once 09/05/12 0101 09/05/12 0256   09/05/12 0115  metroNIDAZOLE (FLAGYL) IVPB 500 mg     500 mg 100 mL/hr over 60 Minutes Intravenous  Once  09/05/12 0101 09/05/12 0225      Ovidio Kin, MD, FACS Pager: [phone],   [facility] Office: [phone] 09/09/2012

## 2012-09-10 LAB — CBC
Hemoglobin: 9.9 g/dL — ABNORMAL LOW (ref 13.0–17.0)
MCH: 29.2 pg (ref 26.0–34.0)
MCV: 82.9 fL (ref 78.0–100.0)
RBC: 3.39 MIL/uL — ABNORMAL LOW (ref 4.22–5.81)

## 2012-09-10 LAB — BASIC METABOLIC PANEL
BUN: 17 mg/dL (ref 6–23)
CO2: 25 mEq/L (ref 19–32)
Calcium: 8.3 mg/dL — ABNORMAL LOW (ref 8.4–10.5)
Chloride: 98 mEq/L (ref 96–112)
Creatinine, Ser: 0.81 mg/dL (ref 0.50–1.35)
GFR calc Af Amer: >90 mL/min (ref >90)
GFR calc non Af Amer: 82 mL/min — ABNORMAL LOW (ref >90)
Glucose, Bld: 135 mg/dL — ABNORMAL HIGH (ref 70–99)
Potassium: 3.2 mEq/L — ABNORMAL LOW (ref 3.5–5.1)
Sodium: 135 mEq/L (ref 135–145)

## 2012-09-10 LAB — GLUCOSE, CAPILLARY
Glucose-Capillary: 122 mg/dL — ABNORMAL HIGH (ref 70–99)
Glucose-Capillary: 115 mg/dL — ABNORMAL HIGH (ref 70–99)

## 2012-09-10 LAB — MAGNESIUM: Magnesium: 1.6 mg/dL (ref 1.5–2.5)

## 2012-09-10 MED ORDER — POTASSIUM CHLORIDE 10 MEQ/100ML IV SOLN
INTRAVENOUS | Status: AC
  Administered 2012-09-10: 10 meq via INTRAVENOUS
  Filled 2012-09-10: qty 400

## 2012-09-10 MED ORDER — SODIUM CHLORIDE 0.9 % IJ SOLN
10.0000 mL | INTRAMUSCULAR | Status: DC | PRN
  Administered 2012-09-12 – 2012-09-14 (×2): 10 mL

## 2012-09-10 MED ORDER — POTASSIUM CHLORIDE 10 MEQ/50ML IV SOLN
10.0000 meq | INTRAVENOUS | Status: AC
  Administered 2012-09-10 (×4): 10 meq via INTRAVENOUS

## 2012-09-10 MED ORDER — POTASSIUM CHLORIDE 20 MEQ PO PACK: 20.0000 meq | PACK | Freq: Three times a day (TID) | ORAL | Status: DC

## 2012-09-10 MED ORDER — MAGNESIUM SULFATE 40 MG/ML IJ SOLN
2.0000 g | Freq: Once | INTRAMUSCULAR | Status: AC
  Administered 2012-09-10: 2 g via INTRAVENOUS
  Filled 2012-09-10: qty 50

## 2012-09-10 MED ORDER — POTASSIUM CHLORIDE CRYS ER 20 MEQ PO TBCR
20.0000 meq | EXTENDED_RELEASE_TABLET | Freq: Three times a day (TID) | ORAL | Status: DC
  Administered 2012-09-10 – 2012-09-11 (×3): 20 meq via ORAL
  Filled 2012-09-10 (×5): qty 1

## 2012-09-10 NOTE — Progress Notes (Signed)
Pt converted to sinus rhythm. MD notified.

## 2012-09-10 NOTE — Progress Notes (Signed)
Family Medicine Teaching Service Daily Note Service Pager: [phone]  Patient name: Todd [name] Medical record number: [id number] Date of birth: 1929-09-18 Age: 79 y.o. Gender: male  Primary Care Provider: [name], MD Consultants:   General Surgery  [facility]  CODE STATUS: DNR  SUBJECTIVE  Feeling okay.  No concerns currently.  Was flushed overnight when entered A. Fib with RVR.  Noted to be increasing liquid PO intake.  Tried KDUR with some stomach upset  OBJECTIVE  Vitals: Temp:  [98.2 F (36.8 C)-98.9 F (37.2 C)] 98.2 F (36.8 C) (02/16 0400) Pulse Rate:  [70-146] 81 (02/16 0705) Resp:  [12-26] 15 (02/16 0705) BP: (98-152)/(52-106) 100/52 mmHg (02/16 0705) SpO2:  [90 %-96 %] 95 % (02/16 0705)  Weight: Wt Readings from Last 3 Encounters:  09/08/12 233 lb 11 oz (106 kg)  09/08/12 233 lb 11 oz (106 kg)  09/08/12 233 lb 11 oz (106 kg)    I&Os: Yesterday: 02/15 0701 - 02/16 0700 In: 1352.8 [I.V.:1202.8; IV Piggyback:150] Out: 4625 [Urine:4625] This shift:     PE: Gen: NAD, alert, cooperative with exam, sitting in chair  HEENT: NCAT, EOMI, PERRL  CV: RRR, 2-3/6 systolic murmur  Resp: Some scattered wheezes, no crackles. non-labored  Abd: slightly tense, tender to palpation throughout., new ostomy in place, pink, minimal output. BS hypoactive  Ext: 1+ edema, warm  Neuro: Alert and oriented, No gross deficits   LABS: CBC BMET   Recent Labs Lab 09/08/12 0315 09/09/12 0500 09/10/12 0525  WBC 9.2 11.2* 9.2  HGB 10.6* 10.1* 9.9*  HCT 30.9* 30.4* 28.1*  PLT 175 211 211    Recent Labs Lab 09/08/12 0315 09/09/12 0500 09/10/12 0525  NA 137 136 135  K 3.7 3.2* 3.0*  CL 104 100 96  CO2 22 25 25   BUN 17 12 15   CREATININE 0.76 0.79 0.79  GLUCOSE 112* 123* 135*  CALCIUM 8.1* 8.1* 8.3*      Ref. Range 09/06/2012 15:22  CEA Latest Range: 0.0-5.0 ng/mL 59.3 (H)   URINE STUDIES:  09/05/2012 08:37 09/07/2012 22:01  Color, Urine AMBER (A)  BROWN (A)  APPearance CLEAR TURBID (A)  Specific Gravity, Urine 1.010 1.023  pH 5.0 5.5  Glucose NEGATIVE NEGATIVE  Bilirubin Urine NEGATIVE LARGE (A)  Ketones, ur 15 (A) 15 (A)  Protein NEGATIVE 100 (A)  Urobilinogen, UA 0.2 1.0  Nitrite POSITIVE (A) NEGATIVE  Leukocytes, UA NEGATIVE MODERATE (A)  Hgb urine dipstick NEGATIVE LARGE (A)  Urine-Other  LESS THAN 10 mL OF URINE SUBMITTED  WBC, UA 0-2 7-10  RBC / HPF 0-2 TOO NUMEROUS TO COUNT  Bacteria, UA RARE RARE  Casts HYALINE CASTS (A)    MICRO: 2/10 - Stool Culture >> Normal - Final 2/12 - C Diff >> Negative 2/12 - Blood Cultures X 4 >> NGTD 2/13 - Urine Culture >> No Growth - Final  SURGICAL PATHOLOGY 2/11 - Biopsy from Sigmoidoscopy >> INVASIVE ADENOCARCINOMA,  IMAGING: 09/06/2012  Dg Chest 2 View  1.  Suspected lower lobe airspace disease may represent early pneumonia.  2.  Low lung volumes and basilar atelectasis.  3.  Mild cardiomegaly.      09/05/2012  Ct Abdomen Pelvis W Contrast 1.  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  No evidence for obstruction; no perforation seen.  Trace associated free fluid noted.  2.  Mild diffuse wall  thickening involving the proximal sigmoid colon may reflect chronic inflammation due to the more distal mass; the remaining colon is unremarkable.  3.  Multiple mildly heterogeneous slightly ill-defined masses throughout the right hepatic lobe, measuring up to 11.5 cm, compatible with metastatic disease.  4.  2.4 x 1.5 cm peripancreatic node demonstrates mild low attenuation, likely reflecting metastatic disease.  5.  2.6 cm adrenal mass may reflect metastatic disease, though it is nonspecific in appearance.  6.  Focal left basilar airspace opacification may reflect atelectasis or possibly pneumonia.  7.  Diffuse coronary artery calcifications seen.  8.  Bilateral renal  cysts, particularly prominent on the left side.  9.  Diffuse calcification along the abdominal aorta and its branches.  10.  Markedly enlarged prostate noted, measuring 7.7 cm in size.  11.  Diffuse degenerative change noted along the lumbar spine.   09/07/2012  Dg Chest Port 1 View: Right port has been placed.  The tip appears be at the level of the cavoatrial junction and possibly proximal right atrium.  This can be assessed on follow-up two-view exam when the patient is able.  This is a call report.    No results found.   PROCEDURES:   2/11 - Flexible Sigmoidoscopy:  Circumferential obstructing mass at the sigmoid colon at approximately 35 cm unable to be traversed with the scope. - INVASIVE ADENOCARCINOMA, per PATH.   2/12 - 2D ECHO:  35-40% EF & Grade 1 Diastolic Dysfunction  Mild Aortic Stenosis  Mild Mitral Viale regurgitation  Left Atrial Dilation  Mild Triscupid regurgitation  Pulmonary HTN: above RA pressure  2/13 - Sigmoid Colon Resection with Left Ostomy, Hartmann's Pouch 2/13 - Porta-Cath Placement   Medications:    . doxazosin  8 mg Oral Daily  . furosemide  40 mg Oral BID  . heparin  5,000 Units Subcutaneous Q8H  . insulin aspart  0-9 Units Subcutaneous Q4H  . isosorbide dinitrate  30 mg Oral BID  . levofloxacin (LEVAQUIN) IV  750 mg Intravenous Q24H  . metoprolol succinate  25 mg Oral Daily  . potassium chloride  20 mEq Oral TID  . senna  2 tablet Oral Daily  . sodium chloride  3 mL Intravenous Q12H  . traMADol  50 mg Oral Q6H    Assessment & Plan  LOS: 10 79 y.o. year old male with significant cardiac history who presented with acute onset of Nausea, Vomiting, & Diarrhea found to have colonic mass on CT scan.  Flex Sig revealed occlusive adenocarcinoma and underwent colonic resection with colostomy.  Developed new onset A. Fib with RVR post op Day # 2  # Adenocarcinoma of Sigmoid Colon, s/p sigmoid colectomy with colostomy & Power Port Placement,  with Metastatic Disease to Liver.: Given findings on CT likely metastatic process.  Empirically tx with Cipro/Flagyl now narrowed to Levaquin for CAP.  Followed by GI, Gen Surg, and Oncology.  S/p Port placement for out patient CHEMO.   PRN Morphine  Tramadol scheduled  Advancing diet per surgery, improved po intake overnight. Continue Zofran  # A. Fib with RVR - resolved - now with apparent NSR with frequent >30 PVCs with 1 noted triplet.  Asymptomatic: Developed post op Day #2 (Hosp Day #5).  Rate controlled following Metoprolol X 1 and Dilt Drip  SE cardiology to re-evaluate.  Appreciate anticoagulation recommendations  ? Transition to po dilt today  Repeat EKG  # Confusion & Agitation - improved: likely some ICU delirum with Dilaudid.  Have  changed to Morphine.  Continue to monitor.   # CAD, Systolic and Diastolic CHF, Ischemic Cardiomyopathy, Hx of MI, Hx of HTN:  Hx of 100% occluded LAD Followed by [facility].  Given >10-11% Intraoperative risk.  Holding home HCTZ and Benzapril due to previoulsy low UOP (improved).  Pt has previously refused Pacer with ICD and not interested at this time but will defer to CARDS.  Imdur & BB  Dilt    # ID: Pneumonia - CAP, ?UTI: Remained afebrile after initiation of Levaquin,  Normal work of breathing Levaquin for coverage of ? Urinary source as well, urine culture negative  Levaquin  # Hematuria (Likely Traumatic Cath), Low UOP, BPH: Had traumatic foley placement with some blood clots noted.  Required Coude catheter.  Taken out and tolerating well.  UOP significantly improved 2/16.  Cardura   # Anemia: Trended from 13.0 >> 9s and 10s throughout hospitalization.  Had some BRB per rectum  Stable, continue to monitor,  # DM2 & HLD: Last A1c 6.2; CBGs controlled.  Holding home statin.   # Hypokalemia: running in fluids and has repleated po X1. Will recheck BMET this PM.    Consider runs of KCL now that using porta-cath with central  access.  Check Mag & Phos  --- FEN  *1/2 NS + 20KCl @ 173ml/hr -NPO --- PPx: SQ Heparin   Disposition  Pt was improving however developed A Fib with RVR.  Now in apparent NSR (ekg pending) with frequent PVCs and noted triplet.  Will optimize electrolyte status.  Cardiology to re-evaluate today.  Needs to be transitioned to po Dilt and discuss anti-coagulation.  Continue to advance diet per surgery.  F/u with ONC as OP   Andrena Mews, DO [facility] Resident - PGY-2 09/10/2012 8:51 AM

## 2012-09-10 NOTE — Progress Notes (Signed)
FMTS Attending Daily Note: Lawren Sexson MD [phone] pager office [phone] I  have seen and examined this patient, reviewed their chart. I have discussed this patient with the resident. I agree with the resident's findings, assessment and care plan. 

## 2012-09-10 NOTE — Progress Notes (Signed)
[facility]  Subjective: No complaints.   Objective: Vital signs in last 24 hours: Temp:  [98.2 F (36.8 C)-98.9 F (37.2 C)] 98.2 F (36.8 C) (02/16 0400) Pulse Rate:  [70-146] 76 (02/16 0900) Resp:  [12-26] 21 (02/16 0900) BP: (98-152)/(52-106) 116/55 mmHg (02/16 0900) SpO2:  [90 %-96 %] 96 % (02/16 0900) Last BM Date: 09/09/12 (colostomy bag)  Intake/Output from previous day: 02/15 0701 - 02/16 0700 In: 1352.8 [I.V.:1202.8; IV Piggyback:150] Out: 4625 [Urine:4625] Intake/Output this shift:    Medications Current Facility-Administered Medications  Medication Dose Route Frequency Provider Last Rate Last Dose  . 0.45 % NaCl with KCl 20 mEq / L infusion   Intravenous Continuous Kandis Cocking, MD 100 mL/hr at 09/10/12 0104    . chlorpheniramine-HYDROcodone (TUSSIONEX) 10-8 MG/5ML suspension 5 mL  5 mL Oral Q8H PRN Latrelle Dodrill, MD   5 mL at 09/08/12 2305  . diltiazem (CARDIZEM) 100 mg in dextrose 5 % 100 mL infusion  5-15 mg/hr Intravenous Titrated Elenora Gamma, MD 5 mL/hr at 09/10/12 0850 5 mg/hr at 09/10/12 0850  . doxazosin (CARDURA) tablet 8 mg  8 mg Oral Daily Eda Paschal Cantril, [place]   8 mg at 09/10/12 4010  . furosemide (LASIX) tablet 40 mg  40 mg Oral BID Marykay Lex, MD   40 mg at 09/10/12 0920  . heparin injection 5,000 Units  5,000 Units Subcutaneous Q8H Kandis Cocking, MD   5,000 Units at 09/10/12 [id number]  . insulin aspart (novoLOG) injection 0-9 Units  0-9 Units Subcutaneous Q4H Christina P Rama, MD      . isosorbide dinitrate (ISORDIL) tablet 30 mg  30 mg Oral BID Atilano Ina, MD,FACS   30 mg at 09/10/12 0919  . lactated ringers infusion   Intravenous Continuous Kandis Cocking, MD 50 mL/hr at 09/07/12 1000    . levofloxacin (LEVAQUIN) IVPB 750 mg  750 mg Intravenous Q24H Ardyth Gal, MD   750 mg at 09/09/12 2032  . loratadine (CLARITIN) tablet 10 mg  10 mg Oral Daily PRN Andrena Mews, DO      . magnesium sulfate IVPB  2 g 50 mL  2 g Intravenous Once Andrena Mews, DO      . metoprolol (LOPRESSOR) injection 5 mg  5 mg Intravenous Q4H PRN Chrystie Nose, MD   5 mg at 09/09/12 1912  . metoprolol succinate (TOPROL-XL) 24 hr tablet 25 mg  25 mg Oral Daily Chrystie Nose, MD   25 mg at 09/10/12 0920  . morphine 2 MG/ML injection 2 mg  2 mg Intravenous Q2H PRN Elenora Gamma, MD   2 mg at 09/09/12 1502  . nitroGLYCERIN (NITROSTAT) SL tablet 0.4 mg  0.4 mg Sublingual Q5 min PRN Maryruth Bun Rama, MD      . ondansetron (ZOFRAN) tablet 4 mg  4 mg Oral Q6H PRN Kandis Cocking, MD   4 mg at 09/09/12 2358   Or  . ondansetron [facility]) injection 4 mg  4 mg Intravenous Q6H PRN Kandis Cocking, MD   4 mg at 09/09/12 3664  . potassium chloride 10 mEq in 50 mL *CENTRAL LINE* IVPB  10 mEq Intravenous Q1 Hr x 4 Andrena Mews, DO      . potassium chloride SA (K-DUR,KLOR-CON) CR tablet 20 mEq  20 mEq Oral TID Nestor Ramp, MD   20 mEq at 09/10/12 0920  . senna (SENOKOT) tablet 17.2 mg  2 tablet Oral Daily Garnetta Buddy, MD   17.2 mg at 09/09/12 1021  . simethicone (MYLICON) chewable tablet 80 mg  80 mg Oral QID PRN Garnetta Buddy, MD      . sodium chloride 0.9 % injection 10-40 mL  10-40 mL Intracatheter PRN Nestor Ramp, MD      . sodium chloride 0.9 % injection 3 mL  3 mL Intravenous Q12H Maryruth Bun Rama, MD   3 mL at 09/09/12 2236  . traMADol (ULTRAM) tablet 50 mg  50 mg Oral Q6H Elenora Gamma, MD   50 mg at 09/10/12 0272    PE: General appearance: alert, cooperative and no distress Lungs: clear to auscultation bilaterally Heart: regular rate and rhythm Extremities: no LEE Pulses: 2+ and symmetric Skin: warm and dry Neurologic: Grossly normal  Lab Results:   Recent Labs  09/08/12 0315 09/09/12 0500 09/10/12 0525  WBC 9.2 11.2* 9.2  HGB 10.6* 10.1* 9.9*  HCT 30.9* 30.4* 28.1*  PLT 175 211 211   BMET  Recent Labs  09/08/12 0315 09/09/12 0500 09/10/12 0525  NA 137 136 135  K 3.7 3.2*  3.0*  CL 104 100 96  CO2 22 25 25   GLUCOSE 112* 123* 135*  BUN 17 12 15   CREATININE 0.76 0.79 0.79  CALCIUM 8.1* 8.1* 8.3*   Assessment/Plan  Principal Problem:   Nausea with vomiting and diarrhea Active Problems:   DIABETES MELLITUS, CONTROLLED, WITHOUT COMPLICATIONS   HYPERLIPIDEMIA   Gout, unspecified   OBESITY, NOS   HYPERTENSION, BENIGN SYSTEMIC   HYPERTENSION   Ischemic cardiomyopathy, EF 30% Myoview 3/12   Colonic mass   Elevated lactic acid level   Elevated lipase   Leukocytosis, unspecified   Prostate enlargement   CAD, Ant MI 1990- total LAD with collaterals, Myoview low risk 3/12   Encounter for pre-operative cardiovascular clearance   Heart murmur    LOS: 6 days   Plan: Pt developed A-fib with RVR w/ rates in the 130's. He was placed on IV Diltiazem and we were asked to re-evaluate. He is now in NSR on telemetry with frequent PVCs. BP is also stable. He was asymptomatic during episode. Can D/C IV Dilt. Consider increasing Toprol XL to 50 mg. ICD therapy was discussed with the patient and his daughter, who was present at the time. We do not recommend anticoagulation, due to age and bleeding risk.   Lajune Perine M. Sharol Harness, PA-C 09/10/2012 11:46 AM

## 2012-09-10 NOTE — Progress Notes (Signed)
General Surgery Note  LOS: 6 days  POD -  3 Days Post-Op Room - 2616  Assessment/Plan: 1.  LAPAROSCOPIC ASSISTED SIGMOID COLON RESECTION, END Left OSTOMY, Hartmann's pouch, Placement of power port - Todd [name] - 09/07/2012  For obstructing colon cancer.  No ostomy output.  Will allow sips from floor, no tray.   2.  Traumatic hematuria - seen by Dr. Laverle Patter  BPH - has tolerated foley out  On Cardura - voiding frequently with lasix 3.  Metastatic disease to liver.  Seen by Dr. Earl Gala 4.  Diabetes mellitus 5.  Cardiomyopathy with CAD  Distant h/o anterior MI with 100% occluded LAD & associated Ischemic Cardiomyopathy EF ~35% with chronic mild dependent edema - followed by Dr. Ranae Palms  Had episode of rapid A. Fib - on cardizem drip 6.  DVT prophylaxis - SQ heparin 7.  On levaquin - not needed from my standpoint 8.  Hypokalemia  Will replace orally  Subjective:  Doing well.  Had cardiac issues last PM.  Better this AM.  Wife at bedside. Objective:   Filed Vitals:   09/10/12 0705  BP: 100/52  Pulse: 81  Temp:   Resp: 15     Intake/Output from previous day:  02/15 0701 - 02/16 0700 In: 1352.8 [I.V.:1202.8; IV Piggyback:150] Out: 4625 [Urine:4625]  Intake/Output this shift:      Physical Exam:   General: WN older WM who is alert and oriented. He is sitting in the chair.   HEENT: Normal. Pupils equal. .   Lungs: Clear.    Abdomen: Has bowel sounds.   Wound: Ostomy okay.  Dressing intact.   Neurologic:  Grossly intact to motor and sensory function.     Lab Results:     Recent Labs  09/09/12 0500 09/10/12 0525  WBC 11.2* 9.2  HGB 10.1* 9.9*  HCT 30.4* 28.1*  PLT 211 211    BMET    Recent Labs  09/09/12 0500 09/10/12 0525  NA 136 135  K 3.2* 3.0*  CL 100 96  CO2 25 25  GLUCOSE 123* 135*  BUN 12 15  CREATININE 0.79 0.79  CALCIUM 8.1* 8.3*    PT/INR  No results found for this basename: LABPROT, INR,  in the last 72 hours  ABG  No results found  for this basename: PHART, PCO2, PO2, HCO3,  in the last 72 hours   Studies/Results:  No results found.   Anti-infectives:   Anti-infectives   Start     Dose/Rate Route Frequency Ordered Stop   09/06/12 1930  levofloxacin (LEVAQUIN) IVPB 750 mg     750 mg 100 mL/hr over 90 Minutes Intravenous Every 24 hours 09/06/12 1812     09/05/12 1200  ciprofloxacin (CIPRO) IVPB 400 mg  Status:  Discontinued     400 mg 200 mL/hr over 60 Minutes Intravenous Every 12 hours 09/05/12 0404 09/06/12 1812   09/05/12 1000  metroNIDAZOLE (FLAGYL) IVPB 500 mg  Status:  Discontinued     500 mg 100 mL/hr over 60 Minutes Intravenous Every 8 hours 09/05/12 0404 09/08/12 1103   09/05/12 0115  ciprofloxacin (CIPRO) IVPB 400 mg     400 mg 200 mL/hr over 60 Minutes Intravenous  Once 09/05/12 0101 09/05/12 0256   09/05/12 0115  metroNIDAZOLE (FLAGYL) IVPB 500 mg     500 mg 100 mL/hr over 60 Minutes Intravenous  Once 09/05/12 0101 09/05/12 0225      Ovidio Kin, MD, FACS Pager: [phone],   [facility]  [place] [facility] Office: [phone] 09/10/2012

## 2012-09-11 ENCOUNTER — Encounter (HOSPITAL_COMMUNITY): Payer: Self-pay | Admitting: Surgery

## 2012-09-11 DIAGNOSIS — M109 Gout, unspecified: Secondary | ICD-10-CM

## 2012-09-11 LAB — BASIC METABOLIC PANEL
BUN: 14 mg/dL (ref 6–23)
CO2: 25 mEq/L (ref 19–32)
Calcium: 8.5 mg/dL (ref 8.4–10.5)
Chloride: 99 mEq/L (ref 96–112)
Creatinine, Ser: 0.72 mg/dL (ref 0.50–1.35)
Glucose, Bld: 141 mg/dL — ABNORMAL HIGH (ref 70–99)

## 2012-09-11 LAB — CBC
HCT: 30.5 % — ABNORMAL LOW (ref 39.0–52.0)
Hemoglobin: 10.6 g/dL — ABNORMAL LOW (ref 13.0–17.0)
MCH: 29 pg (ref 26.0–34.0)
MCV: 83.3 fL (ref 78.0–100.0)
RBC: 3.66 MIL/uL — ABNORMAL LOW (ref 4.22–5.81)
WBC: 8.5 10*3/uL (ref 4.0–10.5)

## 2012-09-11 LAB — GLUCOSE, CAPILLARY: Glucose-Capillary: 141 mg/dL — ABNORMAL HIGH (ref 70–99)

## 2012-09-11 MED ORDER — METOPROLOL TARTRATE 25 MG PO TABS
25.0000 mg | ORAL_TABLET | Freq: Once | ORAL | Status: AC
  Administered 2012-09-11: 25 mg via ORAL
  Filled 2012-09-11: qty 1

## 2012-09-11 MED ORDER — PREDNISONE 20 MG PO TABS
40.0000 mg | ORAL_TABLET | Freq: Every day | ORAL | Status: DC
  Filled 2012-09-11: qty 2

## 2012-09-11 MED ORDER — METOPROLOL SUCCINATE ER 50 MG PO TB24
50.0000 mg | ORAL_TABLET | Freq: Every day | ORAL | Status: DC
  Administered 2012-09-12 – 2012-09-14 (×3): 50 mg via ORAL
  Filled 2012-09-11 (×3): qty 1

## 2012-09-11 MED ORDER — FUROSEMIDE 40 MG PO TABS
40.0000 mg | ORAL_TABLET | Freq: Every day | ORAL | Status: DC
  Administered 2012-09-12 – 2012-09-14 (×3): 40 mg via ORAL
  Filled 2012-09-11 (×3): qty 1

## 2012-09-11 MED ORDER — PREDNISONE 20 MG PO TABS
40.0000 mg | ORAL_TABLET | Freq: Every day | ORAL | Status: AC
  Administered 2012-09-11 – 2012-09-13 (×3): 40 mg via ORAL
  Filled 2012-09-11 (×3): qty 2

## 2012-09-11 NOTE — Progress Notes (Signed)
Occupational Therapy Evaluation Patient Details Name: Todd [name] MRN: [id number] DOB: 1929-11-28 Today's Date: 09/11/2012 Time: 4098-1191 OT Time Calculation (min): 44 min  OT Assessment / Plan / Recommendation Clinical Impression  79 yo with recent colostomy due to colon CA.  Hx of CHF.PTA, pt independent with ADL and mobility. Pt currently requires mod A for mobility and max A with LB ADL. Somewhat limited by gout pain.  Pt will benefit from skilled OT services to max indepdence with ADL and functional mobility to facilitate D/C home with [facility] services. discussed D/C situation at length with pt/pt's son. Pt wants to go home and does not want to go to SNF. Feel that this option is appropriate with level of supprt that pt has. Pt will need w/c, RW (may need platform if wrist continues to be painful), 3 in 1 for home D/C. Also rec HHaide. Pt/family also given information regarding services, i.e. [facility]. Pt will have 24/7 assistnace after D/C.     OT Assessment  Patient needs continued OT Services    Follow Up Recommendations  Home health OT;Supervision/Assistance - 24 hour;Other (comment) (HH aide)    Barriers to Discharge None    Equipment Recommendations  3 in 1 bedside comode;Wheelchair (measurements OT);Wheelchair cushion (measurements OT)    Recommendations for Other Services    Frequency  Min 3X/week    Precautions / Restrictions Precautions Precautions: Fall Precaution Comments: ostomy   Pertinent Vitals/Pain Pain 3. Abdomen HR 75. )2 97 RA. RR 19. BP 119/47    ADL  Eating/Feeding: Independent Where Assessed - Eating/Feeding: Chair Grooming: Set up;Supervision/safety Where Assessed - Grooming: Supported sitting Upper Body Bathing: Minimal assistance Where Assessed - Upper Body Bathing: Supported sitting Lower Body Bathing: Maximal assistance Where Assessed - Lower Body Bathing: Supported sit to stand Upper Body Dressing: Minimal assistance Where Assessed -  Upper Body Dressing: Supported sitting Lower Body Dressing: Maximal assistance Where Assessed - Lower Body Dressing: Supported sit to Pharmacist, hospital: Moderate assistance Toilet Transfer Method: Sit to stand;Stand pivot Toilet Transfer Equipment: Bedside commode Toileting - Clothing Manipulation and Hygiene: Moderate assistance Where Assessed - Toileting Clothing Manipulation and Hygiene: Standing Equipment Used: Gait belt;Rolling walker Transfers/Ambulation Related to ADLs: mod A . limited by gout R wrist and L ankle ADL Comments: limited by gout and abdominal pain. would benefit from AE education    OT Diagnosis: Generalized weakness;Acute pain  OT Problem List: Decreased strength;Decreased activity tolerance;Decreased knowledge of use of DME or AE;Decreased knowledge of precautions;Cardiopulmonary status limiting activity;Obesity;Pain;Increased edema OT Treatment Interventions: Self-care/ADL training;Therapeutic exercise;Energy conservation;DME and/or AE instruction;Therapeutic activities;Patient/family education   OT Goals Acute Rehab OT Goals OT Goal Formulation: With patient Time For Goal Achievement: 09/25/12 Potential to Achieve Goals: Good ADL Goals Pt Will Perform Lower Body Bathing: with caregiver independent in assisting;with min assist;Sit to stand from chair;Unsupported;with adaptive equipment;Independently ADL Goal: Lower Body Bathing - Progress: Goal set today Pt Will Perform Lower Body Dressing: with min assist;with caregiver independent in assisting;Sit to stand from chair;Unsupported;with adaptive equipment ADL Goal: Lower Body Dressing - Progress: Goal set today Pt Will Transfer to Toilet: with supervision;with DME;3-in-1;with caregiver independent in assisting ADL Goal: Toilet Transfer - Progress: Goal set today Pt Will Perform Toileting - Clothing Manipulation: with modified independence;Standing ADL Goal: Toileting - Clothing Manipulation - Progress: Goal set  today Pt Will Perform Tub/Shower Transfer: with min assist;with caregiver independent in assisting;Ambulation;Shower transfer;Other (comment) (using 3 in 1) ADL Goal: Tub/Shower Transfer - Progress: Goal set today Additional ADL  Goal #1: Pt/family will verbalize understanding of proper home set up and use of DME to increase safety and decrease caregiver burden ADL Goal: Additional Goal #1 - Progress: Goal set today Arm Goals Pt Will Complete Theraband Exer: with supervision, verbal cues required/provided;Bilateral upper extremities;to increase strength;2 sets;Level 2 Theraband Arm Goal: Theraband Exercises - Progress: Goal set today  Visit Information  Last OT Received On: 09/11/12 Assistance Needed: +1    Subjective Data      Prior Functioning     Home Living Lives With: Spouse Available Help at Discharge: Family;Available 24 hours/day Type of Home: House Home Access: Stairs to enter [employer] of Steps: 4 Entrance Stairs-Rails: Right Home Layout: One level Bathroom Shower/Tub: Health visitor: Standard Bathroom Accessibility: Yes How Accessible: Accessible via walker Home Adaptive Equipment: Straight cane Additional Comments: may be able to boorrow a w/c and walker Prior Function Level of Independence: Independent Able to Take Stairs?: Yes Driving: Yes Vocation: Retired Musician: No difficulties Dominant Hand: Right         Vision/Perception Vision - History Baseline Vision: Wears glasses only for reading   Cognition  Cognition Overall Cognitive Status: Appears within functional limits for tasks assessed/performed Arousal/Alertness: Awake/alert Orientation Level: Appears intact for tasks assessed Behavior During Session: [facility] for tasks performed    Extremity/Trunk Assessment Right Upper Extremity Assessment RUE ROM/Strength/Tone: Deficits;Due to pain RUE ROM/Strength/Tone Deficits: R wrist - gout (general  weakness @ 4/5) RUE Sensation: WFL - Light Touch;WFL - Proprioception RUE Coordination: WFL - gross/fine motor Left Upper Extremity Assessment LUE ROM/Strength/Tone: WFL for tasks assessed Right Lower Extremity Assessment RLE ROM/Strength/Tone: Within functional levels Left Lower Extremity Assessment LLE ROM/Strength/Tone: Within functional levels Trunk Assessment Trunk Assessment: Normal     Mobility Transfers Transfers: Sit to Stand;Stand to Sit Sit to Stand: With upper extremity assist;From chair/3-in-1;3: Mod assist Stand to Sit: To chair/3-in-1;3: Mod assist Details for Transfer Assistance: vc for sequencing and safety     Exercise Other Exercises Other Exercises: BUE general AROM exercises Other Exercises: sitting with trunk off back of chair to facilitate core   Balance Balance Balance Assessed: Yes Static Sitting Balance Static Sitting - Balance Support: Bilateral upper extremity supported;Feet unsupported Static Sitting - Level of Assistance: 6: Modified independent (Device/Increase time) Static Sitting - Comment/# of Minutes: 8 Static Standing Balance Static Standing - Balance Support: Right upper extremity supported;During functional activity Static Standing - Level of Assistance: 5: Stand by assistance   End of Session OT - End of Session Equipment Utilized During Treatment: Gait belt Activity Tolerance: Patient tolerated treatment well Patient left: in chair;with call bell/phone within reach;with family/visitor present Nurse Communication: Mobility status  GO     Sybil Shrader,HILLARY 09/11/2012, 4:58 PM [facility], OTR/L  [phone] 09/11/2012

## 2012-09-11 NOTE — Progress Notes (Signed)
Family Medicine Teaching Service Daily Progress Note Service Page: [phone]  Subjective:  Pt doing well today, glad to be advancing diet today.  No current diarrhea and denies palpitations.   Objective: Temp:  [97.9 F (36.6 C)-99.3 F (37.4 C)] 97.9 F (36.6 C) (02/17 0817) Pulse Rate:  [65-78] 75 (02/17 0817) Resp:  [15-27] 21 (02/17 0817) BP: (119-146)/(52-76) 140/58 mmHg (02/17 0817) SpO2:  [91 %-99 %] 99 % (02/17 0817) Weight:  [102 kg (224 lb 13.9 oz)] 102 kg (224 lb 13.9 oz) (02/17 0023) Exam: Gen: NAD, alert, cooperative with exam, sitting in chair HEENT: NCAT, EOMI, PERRL CV: RRR, 2-3/6 systolic murmur Resp: Some scattered wheezes, no crackles. non-labored Abd: slightly tense, tender to palpation throughout., ostomy in place. BS hypoactive Ext: 1+ edema, warm Neuro: Alert and oriented, No gross deficits  I have reviewed the patient's medications, labs, imaging, and diagnostic testing.  Notable results are summarized below.  CBC BMET   Recent Labs Lab 09/09/12 0500 09/10/12 0525 09/11/12 0540  WBC 11.2* 9.2 8.5  HGB 10.1* 9.9* 10.6*  HCT 30.4* 28.1* 30.5*  PLT 211 211 230    Recent Labs Lab 09/10/12 0525 09/10/12 1522 09/11/12 0540  NA 135 136 135  K 3.0* 3.2* 3.9  CL 96 98 99  CO2 25 27 25   BUN 15 17 14   CREATININE 0.79 0.81 0.72  GLUCOSE 135* 140* 141*  CALCIUM 8.3* 8.2* 8.5     Imaging/Diagnostic Tests: CT abd/Pelvis with 09/05/2012 IMPRESSION:  1. Suspect primary colonic malignancy at the mid sigmoid colon,  measuring 5.2 x 4.7 x 4.1 cm, with scattered small adjacent nodules  reflecting local spread of disease. No evidence for obstruction;  no perforation seen. Trace associated free fluid noted.  2. Mild diffuse wall thickening involving the proximal sigmoid  colon may reflect chronic inflammation due to the more distal mass;  the remaining colon is unremarkable.  3. Multiple mildly heterogeneous slightly ill-defined masses  throughout the  right hepatic lobe, measuring up to 11.5 cm,  compatible with metastatic disease.  4. 2.4 x 1.5 cm peripancreatic node demonstrates mild low  attenuation, likely reflecting metastatic disease.  5. 2.6 cm adrenal mass may reflect metastatic disease, though it  is nonspecific in appearance.  6. Focal left basilar airspace opacification may reflect  atelectasis or possibly pneumonia.  7. Diffuse coronary artery calcifications seen.  8. Bilateral renal cysts, particularly prominent on the left side.  9. Diffuse calcification along the abdominal aorta and its  branches.  10. Markedly enlarged prostate noted, measuring 7.7 cm in size.  11. Diffuse degenerative change noted along the lumbar spine.  CXR 09/06/2012 IMPRESSION:  1. Suspected lower lobe airspace disease may represent early  pneumonia.  2. Low lung volumes and basilar atelectasis.  3. Mild cardiomegaly.  CXR 09/08/2012 IMPRESSION:  Right port has been placed. The tip appears be at the level of the  cavoatrial junction and possibly proximal right atrium. This can  be assessed on follow-up two-view exam when the patient is able.  Plan: 79 y/o male here with acute onset nausea, vomiting, and diarrhea, found to have colonic mass on CT scan.   Adenocarcinoma Sigmoid Colon, now S/p sigmoid colectomy and power port placement day 3 - Likely metastsatic GI Ca, Biopsy results show invasive adenocarcinoma from tissue Bx on 2/11 - initially treated with cipro falgyl for 2 days, now narrowed to levaquin and completed course (concern for Cardiac SE of drug and Afib RVR) - GI Consulting -  appprecuiate reccs  - Flex sig on 09/05/2012- circumferential mass nearly obstructing the colon 35 cm in  - Cdiff negative, Stool culture pending - Surgery consulting - appreciate reccs and management - Oncology consulting- apprecfiate reccs  - CEA is 59.3, port for expected chemo, agree with surgery - PRN dilaudid for pain will change to morphine, will  add back tramadol scheduled  Afib with RVR - Resolved with NSR and frequent PVCs on Telemetry -   -Placed on Dilt drip, now on Toprol XL 25 mg, consider increasing to 50 mg.  -ICD placement being consider, glady appreciate cards recs  -No anticoagulation due to bleeding risk   Anemia - Hgb 10.6-->9.9, 13.09 on admission  - some expected blood loss anemia and was previously having BRB per rectum before surgery.  - monitor daily CBC  Nausea - some overnight, possibly related to dilaudid (pt's explaanation), also will onsider ileus but has been tolerating sips and chips well until now - PRN zofran, change to morphine + scheduled tramadol  Pneumonia- CAP - Now resolved  - Afebrile  - Normal work of breathinig - Levaquin Completed  - Blood cultures NGTD  UTI - UA on 2/13 showing Large LE, U cx with no growth - Foley now in place, on levaquin for PNA which will adequately cover UTI - Ok to dc foley per urology, I agree with Dr. Ezzard Standing with his only moderate UOP will keep it for one more day to watch I/O, esp considering that he would not like to have it replaced right away.   CAD, CHF, Hx of MI, CHF -Cardiology consulting- appreciate reccs.   - Continue IMDUR, metoprolol succ 25 daily  - TTE with 35-40% and grade 1 diastolic dysf  - Hold ASA and statin  HTN - BP stable - Hold home: HCTZ, benzapril due to UOP, will consider adding back on if BP starts to become elevated  - IMDUR, BB started/addded per cards  HLD - hold simvastatin  DM2 - CBGs: 105-117, pt refusing CBGs at this point  - A1C 6.2 - SSI, Q8 checks  BPH - Continue doxazosin  Hypokalemia and Hypomagnesium  -Pt K+ 3.2 yesterday, after multiple runs 3.9 today.  Mg 1.6 on 09/10/12  -Continue to monitor   FEN/GI: 1/2NS +20KCl @ 100 cc/hr.  Advance Diet today to clears PPx: Heparin SQ   Dispo: Home after surgical intervention, transfer to tele Code Status: DNR  Todd Cranker, DO 09/11/2012, 9:27 AM

## 2012-09-11 NOTE — Progress Notes (Signed)
Physical Therapy Treatment Patient Details Name: Todd [name] MRN: [id number] DOB: 03-29-1930 Today's Date: 09/11/2012 Time: 8295-6213 PT Time Calculation (min): 17 min  PT Assessment / Plan / Recommendation Comments on Treatment Session  Pt admitted with colon CA with mets s/p colectomy/colostomy and currently limited today by gout in left ankle/foot and unable to tolerate movement of ankle or weight bearing. Pt denied attempts at standing due to already in chair after assist of family. Pt reports his normal gout medication can't be taken without eating solid food which he has not had yet and the pain medicine isn't helping. Pr encouraged to continue HEP and mobiiity as able and will continue to follow and progress activity as able. Pt also demonstrated and eduated for use of incentive spirometer x 10.     Follow Up Recommendations        Does the patient have the potential to tolerate intense rehabilitation     Barriers to Discharge        Equipment Recommendations       Recommendations for Other Services    Frequency     Plan Discharge plan remains appropriate;Frequency remains appropriate    Precautions / Restrictions Precautions Precautions: Fall Precaution Comments: ostomy   Pertinent Vitals/Pain 8/10 left ankle/foot pain from gout sats 95% on RA    Mobility  Bed Mobility Bed Mobility: Not assessed Transfers Transfers: Not assessed Details for Transfer Assistance: pt in recliner on arrival and denied attempting standing due to pain in left foot and with movement    Exercises General Exercises - Lower Extremity Long Arc Quad: AROM;Both;20 reps;Seated Hip ABduction/ADduction: AROM;Both;20 reps;Seated Hip Flexion/Marching: AROM;Both;20 reps;Seated Toe Raises: AROM;Right;20 reps;Seated Heel Raises: AROM;Right;20 reps;Seated   PT Diagnosis:    PT Problem List:   PT Treatment Interventions:     PT Goals Acute Rehab PT Goals Pt will Perform Home Exercise Program:  Independently PT Goal: Perform Home Exercise Program - Progress: Goal set today  Visit Information  Last PT Received On: 09/11/12 Assistance Needed: +1    Subjective Data  Subjective: I can't stand today cause of the gout in my foot   Cognition  Cognition Overall Cognitive Status: Appears within functional limits for tasks assessed/performed Arousal/Alertness: Awake/alert Orientation Level: Appears intact for tasks assessed Behavior During Session: [facility] for tasks performed    Balance     End of Session PT - End of Session Activity Tolerance: Patient limited by pain Patient left: in chair;with call bell/phone within reach;with family/visitor present Nurse Communication: Mobility status   GP     Delorse Lek 09/11/2012, 11:17 AM [name], PT [phone]

## 2012-09-11 NOTE — Progress Notes (Signed)
Pt noted to have possible clots of blood in urine. MD notified, sample saved per MD request to examine at next rounding. Cont to monitor pt. Todd [name]

## 2012-09-11 NOTE — Progress Notes (Signed)
4 Days Post-Op  Subjective: Tolerated sips  Objective: Vital signs in last 24 hours: Temp:  [97.9 F (36.6 C)-99.3 F (37.4 C)] 97.9 F (36.6 C) (02/17 0817) Pulse Rate:  [65-78] 75 (02/17 0817) Resp:  [15-27] 21 (02/17 0817) BP: (116-146)/(52-76) 140/58 mmHg (02/17 0817) SpO2:  [91 %-99 %] 99 % (02/17 0817) Weight:  [224 lb 13.9 oz (102 kg)] 224 lb 13.9 oz (102 kg) (02/17 0023) Last BM Date: 09/10/12  Intake/Output from previous day: 02/16 0701 - 02/17 0700 In: 3050 [P.O.:180; I.V.:2520; IV Piggyback:350] Out: 2275 [Urine:2275] Intake/Output this shift: Total I/O In: -  Out: 200 [Urine:200]  Abdomen soft, gas in bag, ostomy viable  Lab Results:   Recent Labs  09/10/12 0525 09/11/12 0540  WBC 9.2 8.5  HGB 9.9* 10.6*  HCT 28.1* 30.5*  PLT 211 230   BMET  Recent Labs  09/10/12 1522 09/11/12 0540  NA 136 135  K 3.2* 3.9  CL 98 99  CO2 27 25  GLUCOSE 140* 141*  BUN 17 14  CREATININE 0.81 0.72  CALCIUM 8.2* 8.5   PT/INR No results found for this basename: LABPROT, INR,  in the last 72 hours ABG No results found for this basename: PHART, PCO2, PO2, HCO3,  in the last 72 hours  Studies/Results: No results found.  Anti-infectives: Anti-infectives   Start     Dose/Rate Route Frequency Ordered Stop   09/06/12 1930  levofloxacin (LEVAQUIN) IVPB 750 mg  Status:  Discontinued     750 mg 100 mL/hr over 90 Minutes Intravenous Every 24 hours 09/06/12 1812 09/10/12 1203   09/05/12 1200  ciprofloxacin (CIPRO) IVPB 400 mg  Status:  Discontinued     400 mg 200 mL/hr over 60 Minutes Intravenous Every 12 hours 09/05/12 0404 09/06/12 1812   09/05/12 1000  metroNIDAZOLE (FLAGYL) IVPB 500 mg  Status:  Discontinued     500 mg 100 mL/hr over 60 Minutes Intravenous Every 8 hours 09/05/12 0404 09/08/12 1103   09/05/12 0115  ciprofloxacin (CIPRO) IVPB 400 mg     400 mg 200 mL/hr over 60 Minutes Intravenous  Once 09/05/12 0101 09/05/12 0256   09/05/12 0115  metroNIDAZOLE  (FLAGYL) IVPB 500 mg     500 mg 100 mL/hr over 60 Minutes Intravenous  Once 09/05/12 0101 09/05/12 0225      Assessment/Plan: s/p Procedure(s): LAPAROSCOPIC ASSISTED SIGMOID COLON RESECTION END OSTOMY (N/A) Placement of power port (N/A)  Will start clear liquids  LOS: 7 days    Daija Routson A 09/11/2012

## 2012-09-11 NOTE — Progress Notes (Signed)
Pt is refusing CBG checks and SSI coverage.

## 2012-09-11 NOTE — Progress Notes (Signed)
Attempted to call report x 1. Todd [name]  

## 2012-09-11 NOTE — Progress Notes (Signed)
Report called to Misty Stanley, [place] RN. Pt transferred to [id number] via wheelchair, IV, no O2. Meds in chart, family and belongings at bedside. VS stable at time of transfer. No current questions or complaints. Kerra Guilfoil L

## 2012-09-11 NOTE — Progress Notes (Signed)
FMTS Attending Daily Note:  Todd Don MD  [phone] pager  Family Practice pager:  [phone] I have seen and examined this patient and have reviewed their chart. I have discussed this patient with the resident. I agree with the resident's findings, assessment and care plan.  Additionally: - Pt doing well, sitting in chair beside bed this afternoon. - Only complaint is of gout attack in Left ankle and Right wrist.  Willing to try Prednisone for relief, does not want Diclofenac which he takes at home nor colchicine.  Appreciate specialty input.  He has air in his colostomy bag and is tolerating clear liquids.  No further RVR, NSR with frequent PVCs on cardiac monitoring.  Plan to transfer to telemetry bed and start Prednisone for relief.

## 2012-09-11 NOTE — Plan of Care (Signed)
Problem: Phase II Progression Outcomes Goal: Progress activity as tolerated unless otherwise ordered Outcome: Not Progressing Not progressing due to pain from gout

## 2012-09-12 DIAGNOSIS — M719 Bursopathy, unspecified: Secondary | ICD-10-CM

## 2012-09-12 LAB — CULTURE, BLOOD (ROUTINE X 2): Culture: NO GROWTH

## 2012-09-12 LAB — CBC
HCT: 30.7 % — ABNORMAL LOW (ref 39.0–52.0)
Hemoglobin: 10.6 g/dL — ABNORMAL LOW (ref 13.0–17.0)
MCH: 29.1 pg (ref 26.0–34.0)
MCHC: 34.5 g/dL (ref 30.0–36.0)
MCV: 84.3 fL (ref 78.0–100.0)
RBC: 3.64 MIL/uL — ABNORMAL LOW (ref 4.22–5.81)

## 2012-09-12 LAB — BASIC METABOLIC PANEL
BUN: 12 mg/dL (ref 6–23)
CO2: 27 mEq/L (ref 19–32)
Calcium: 8.7 mg/dL (ref 8.4–10.5)
Glucose, Bld: 153 mg/dL — ABNORMAL HIGH (ref 70–99)
Sodium: 136 mEq/L (ref 135–145)

## 2012-09-12 MED ORDER — ENSURE COMPLETE PO LIQD
237.0000 mL | Freq: Two times a day (BID) | ORAL | Status: DC
  Administered 2012-09-12 – 2012-09-14 (×5): 237 mL via ORAL

## 2012-09-12 NOTE — Progress Notes (Signed)
Physical Therapy Treatment Patient Details Name: Cole Eastridge MRN: [id number] DOB: Dec 28, 1929 Today's Date: 09/12/2012 Time: 9147-8295 PT Time Calculation (min): 23 min  PT Assessment / Plan / Recommendation Comments on Treatment Session  Pt admitted with colon CA with mets s/p colectomy/colostomy. Gout improving and gait improving as well.  Encouraged pt to amb with family.  Left rolling walker in room.    Follow Up Recommendations  Home health PT     Does the patient have the potential to tolerate intense rehabilitation     Barriers to Discharge        Equipment Recommendations  Rolling walker with 5" wheels;Other (comment)    Recommendations for Other Services    Frequency Min 3X/week   Plan Discharge plan remains appropriate;Frequency remains appropriate    Precautions / Restrictions Precautions Precautions: Fall Precaution Comments: ostomy   Pertinent Vitals/Pain Lt foot pain due to gout.    Mobility  Transfers Sit to Stand: 4: Min assist;With upper extremity assist;With armrests;From chair/3-in-1 Stand to Sit: 4: Min assist;With upper extremity assist;With armrests;To chair/3-in-1 Details for Transfer Assistance: verbal cues for hand placement when returning to chair Ambulation/Gait Ambulation/Gait Assistance: 5: Supervision Ambulation Distance (Feet): 175 Feet Assistive device: Rolling walker Gait Pattern: Step-through pattern;Decreased stride length Gait velocity: decreased    Exercises     PT Diagnosis:    PT Problem List:   PT Treatment Interventions:     PT Goals Acute Rehab PT Goals PT Goal: Sit to Stand - Progress: Progressing toward goal PT Goal: Stand to Sit - Progress: Progressing toward goal Pt will Ambulate: 51 - 150 feet;with modified independence;with least restrictive assistive device PT Goal: Ambulate - Progress: Updated due to goal met  Visit Information  Last PT Received On: 09/12/12 Assistance Needed: +1    Subjective Data  Subjective: Pt states his gout is improving.   Cognition  Cognition Overall Cognitive Status: Appears within functional limits for tasks assessed/performed Arousal/Alertness: Awake/alert Orientation Level: Appears intact for tasks assessed Behavior During Session: [facility] for tasks performed    Balance  Static Standing Balance Static Standing - Balance Support: Bilateral upper extremity supported;During functional activity Static Standing - Level of Assistance: 5: Stand by assistance  End of Session PT - End of Session Activity Tolerance: Patient tolerated treatment well Patient left: in chair;with call bell/phone within reach;with family/visitor present Nurse Communication: Mobility status   GP     [facility] 09/12/2012, 3:49 PM  [facility] PT [phone]

## 2012-09-12 NOTE — Progress Notes (Signed)
I have seen and examined the patient and agree with the assessment and plans.  Nalaya Wojdyla A. Leslee Suire  MD, FACS  

## 2012-09-12 NOTE — Progress Notes (Signed)
5 Days Post-Op  Subjective: Pt feels good today.  Pt having good BM and gas in ostomy bag.  Pt ambulating some, but c/o his left foot hurting due to a gout attack.  Pt tolerating clear liquid diet.    Objective: Vital signs in last 24 hours: Temp:  [97.6 F (36.4 C)-98.2 F (36.8 C)] 97.7 F (36.5 C) (02/18 0750) Pulse Rate:  [62-75] 64 (02/18 0750) Resp:  [16-21] 18 (02/18 0750) BP: (121-150)/(53-80) 146/76 mmHg (02/18 0750) SpO2:  [92 %-99 %] 97 % (02/18 0750) Last BM Date: 09/11/12 (colostomy pouch)  Intake/Output from previous day: 02/17 0701 - 02/18 0700 In: 2690 [P.O.:340; I.V.:2350] Out: 2390 [Urine:2350; Stool:40] Intake/Output this shift:    PE: Gen:  Alert, NAD, pleasant Abd: Soft, mild tenderness, ND, +BS, no HSM, incisions C/D/I  Lab Results:   Recent Labs  09/11/12 0540 09/12/12 0500  WBC 8.5 7.4  HGB 10.6* 10.6*  HCT 30.5* 30.7*  PLT 230 263   BMET  Recent Labs  09/11/12 0540 09/12/12 0500  NA 135 136  K 3.9 4.1  CL 99 99  CO2 25 27  GLUCOSE 141* 153*  BUN 14 12  CREATININE 0.72 0.68  CALCIUM 8.5 8.7   PT/INR No results found for this basename: LABPROT, INR,  in the last 72 hours CMP     Component Value Date/Time   NA 136 09/12/2012 0500   K 4.1 09/12/2012 0500   CL 99 09/12/2012 0500   CO2 27 09/12/2012 0500   GLUCOSE 153* 09/12/2012 0500   BUN 12 09/12/2012 0500   CREATININE 0.68 09/12/2012 0500   CREATININE 0.76 06/27/2012 0856   CALCIUM 8.7 09/12/2012 0500   PROT 8.0 09/04/2012 2215   ALBUMIN 4.0 09/04/2012 2215   AST 39* 09/04/2012 2215   ALT 28 09/04/2012 2215   ALKPHOS 173* 09/04/2012 2215   BILITOT 0.4 09/04/2012 2215   GFRNONAA 88* 09/12/2012 0500   GFRAA >90 09/12/2012 0500   Lipase     Component Value Date/Time   LIPASE 104* 09/04/2012 2215       Studies/Results: No results found.  Anti-infectives: Anti-infectives   Start     Dose/Rate Route Frequency Ordered Stop   09/06/12 1930  levofloxacin (LEVAQUIN) IVPB 750 mg   Status:  Discontinued     750 mg 100 mL/hr over 90 Minutes Intravenous Every 24 hours 09/06/12 1812 09/10/12 1203   09/05/12 1200  ciprofloxacin (CIPRO) IVPB 400 mg  Status:  Discontinued     400 mg 200 mL/hr over 60 Minutes Intravenous Every 12 hours 09/05/12 0404 09/06/12 1812   09/05/12 1000  metroNIDAZOLE (FLAGYL) IVPB 500 mg  Status:  Discontinued     500 mg 100 mL/hr over 60 Minutes Intravenous Every 8 hours 09/05/12 0404 09/08/12 1103   09/05/12 0115  ciprofloxacin (CIPRO) IVPB 400 mg     400 mg 200 mL/hr over 60 Minutes Intravenous  Once 09/05/12 0101 09/05/12 0256   09/05/12 0115  metroNIDAZOLE (FLAGYL) IVPB 500 mg     500 mg 100 mL/hr over 60 Minutes Intravenous  Once 09/05/12 0101 09/05/12 0225       Assessment/Plan 1. POD #4 LAPAROSCOPIC ASSISTED SIGMOID COLON RESECTION, END Left OSTOMY, Hartmann's pouch, Placement of power port - D. Newman - 09/07/2012 - For obstructing Adenocarcinoma sigmoid colon cancer.   Ostomy patent with good output. Advance diet to fulls, then heart healthy diet tomorrow if tolerating 2. Traumatic hematuria - seen by Dr. Laverle Patter   BPH -  has tolerated foley out, a blood clot in his urine yesterday, but no gross blood since  On Cardura - voiding frequently with lasix.  Could likely lower IVF down given increase in PO. 3. Metastatic disease to liver.  Seen by Dr. Earl Gala  4. Diabetes mellitus  5. Cardiomyopathy with CAD   Distant h/o anterior MI with 100% occluded LAD & associated Ischemic Cardiomyopathy EF ~35% with  chronic mild dependent edema - followed by Dr. Ranae Palms   Had episode of rapid A. Fib 6. DVT prophylaxis - SQ heparin  7. Disp from surgical standpoint:  when he is tolerating regular diet, good bowel function (BM & flatus), ambulating well, and pain well controlled on orals.         LOS: 8 days    DORT, Aundra Millet 09/12/2012, 8:14 AM Pager: [phone]

## 2012-09-12 NOTE — Progress Notes (Signed)
IP PROGRESS NOTE  Subjective:   Patient feels well. No issues noted.  He has no questions for me today.   Objective:  Vital signs in last 24 hours: Temp:  [97.6 F (36.4 C)-98.2 F (36.8 C)] 98.2 F (36.8 C) (02/18 1030) Pulse Rate:  [64-75] 75 (02/18 1030) Resp:  [16-20] 18 (02/18 1030) BP: (128-150)/(49-80) 139/49 mmHg (02/18 1030) SpO2:  [92 %-98 %] 98 % (02/18 1030) Weight change:  Last BM Date: 09/11/12 (colostomy pouch)  Intake/Output from previous day: 02/17 0701 - 02/18 0700 In: 2690 [P.O.:340; I.V.:2350] Out: 2390 [Urine:2350; Stool:40]  Mouth: mucous membranes moist, pharynx normal without lesions Resp: clear to auscultation bilaterally Cardio: regular rate and rhythm, S1, S2 normal, no murmur, click, rub or gallop GI: soft, non-tender; bowel sounds normal; no masses,  no organomegaly Extremities: extremities normal, atraumatic, no cyanosis or edema  Portacath without erythema  Lab Results:  Recent Labs  09/11/12 0540 09/12/12 0500  WBC 8.5 7.4  HGB 10.6* 10.6*  HCT 30.5* 30.7*  PLT 230 263    BMET  Recent Labs  09/11/12 0540 09/12/12 0500  NA 135 136  K 3.9 4.1  CL 99 99  CO2 25 27  GLUCOSE 141* 153*  BUN 14 12  CREATININE 0.72 0.68  CALCIUM 8.5 8.7    Studies/Results: No results found.  Medications: I have reviewed the patient's current medications.  Assessment/Plan:  79 year old with:  Stave IV colon cancer. He has a large sigmoid mass as well as liver lesions. He is S/P Laparoscopic-assisted sigmoid colectomy with end colostomy and Hartmann's pouch, and placement of right internal jugular power port on 2/13 without complications. He will need IV chemotherapy to palliate his cancer and possible future symptoms and I think he is a reasonable candidate for that.  Upon discharge, I will arrange follow up at the [facility] for follow up or his pathology and evaluation for possible chemotherapy.  His prognosis and  possible chemotherapy success will be discussed as outpatient pending his recovery.  All of his question answered today.   Please call with questions.        LOS: 8 days   [facility] 09/12/2012, 12:15 PM

## 2012-09-12 NOTE — Progress Notes (Signed)
FMTS Attending Daily Note:  Todd Don MD  [phone] pager  Family Practice pager:  [phone] I have seen and examined this patient and have reviewed their chart. I have discussed this patient with the resident. I agree with the resident's findings, assessment and care plan.  Additionally: - Gout much improved s/p Prednisone.  Able to bear weight for several moments whereas unable to bear weight yesterday. - Eating well.  Good ostomy output.  Plan is for transition to PO heart healthy diet tomorrow. - Of note, patient on HCTZ chronically.  Discussed that this is trigger for gout.  He states that he has been on this regimen of Diclofenac + HCTZ for 7-8 years and takes one to reduce the effects of the other and is not interested in changing.  Will revisit with notion of switch tomorrow since he suffers from such long-standing gout and then defer to outpt management.

## 2012-09-12 NOTE — Progress Notes (Signed)
Family Medicine Teaching Service Daily Progress Note Service Page: [phone]  Subjective:  Pt doing well today, advancing his diet to full liquids.    Objective: Temp:  [97.6 F (36.4 C)-98.2 F (36.8 C)] 97.7 F (36.5 C) (02/18 0750) Pulse Rate:  [62-73] 64 (02/18 0750) Resp:  [16-20] 18 (02/18 0750) BP: (121-150)/(53-80) 146/76 mmHg (02/18 0750) SpO2:  [92 %-97 %] 97 % (02/18 0750) Exam: Gen: NAD, alert, cooperative with exam, sitting in chair HEENT: NCAT, EOMI, PERRL CV: RRR, 2-3/6 systolic murmur Resp: Some scattered wheezes, no crackles. non-labored Abd: slightly tense, tender to palpation throughout., ostomy in place. BS hypoactive Ext: 1+ edema, warm Neuro: Alert and oriented, No gross deficits  I have reviewed the patient's medications, labs, imaging, and diagnostic testing.  Notable results are summarized below.  CBC BMET   Recent Labs Lab 09/10/12 0525 09/11/12 0540 09/12/12 0500  WBC 9.2 8.5 7.4  HGB 9.9* 10.6* 10.6*  HCT 28.1* 30.5* 30.7*  PLT 211 230 263    Recent Labs Lab 09/10/12 1522 09/11/12 0540 09/12/12 0500  NA 136 135 136  K 3.2* 3.9 4.1  CL 98 99 99  CO2 27 25 27   BUN 17 14 12   CREATININE 0.81 0.72 0.68  GLUCOSE 140* 141* 153*  CALCIUM 8.2* 8.5 8.7     Imaging/Diagnostic Tests: CT abd/Pelvis with 09/05/2012 IMPRESSION:  1. Suspect primary colonic malignancy at the mid sigmoid colon,  measuring 5.2 x 4.7 x 4.1 cm, with scattered small adjacent nodules  reflecting local spread of disease. No evidence for obstruction;  no perforation seen. Trace associated free fluid noted.  2. Mild diffuse wall thickening involving the proximal sigmoid  colon may reflect chronic inflammation due to the more distal mass;  the remaining colon is unremarkable.  3. Multiple mildly heterogeneous slightly ill-defined masses  throughout the right hepatic lobe, measuring up to 11.5 cm,  compatible with metastatic disease.  4. 2.4 x 1.5 cm peripancreatic  node demonstrates mild low  attenuation, likely reflecting metastatic disease.  5. 2.6 cm adrenal mass may reflect metastatic disease, though it  is nonspecific in appearance.  6. Focal left basilar airspace opacification may reflect  atelectasis or possibly pneumonia.  7. Diffuse coronary artery calcifications seen.  8. Bilateral renal cysts, particularly prominent on the left side.  9. Diffuse calcification along the abdominal aorta and its  branches.  10. Markedly enlarged prostate noted, measuring 7.7 cm in size.  11. Diffuse degenerative change noted along the lumbar spine.  CXR 09/06/2012 IMPRESSION:  1. Suspected lower lobe airspace disease may represent early  pneumonia.  2. Low lung volumes and basilar atelectasis.  3. Mild cardiomegaly.  CXR 09/08/2012 IMPRESSION:  Right port has been placed. The tip appears be at the level of the  cavoatrial junction and possibly proximal right atrium. This can  be assessed on follow-up two-view exam when the patient is able.  Plan: 79 y/o male here with acute onset nausea, vomiting, and diarrhea, found to have colonic mass on CT scan.   Adenocarcinoma Sigmoid Colon, now S/p sigmoid colectomy and power port placement day 3 - Likely metastsatic GI Ca to the Liver, Biopsy results show invasive adenocarcinoma from tissue Bx on 2/11 - initially treated with cipro falgyl for 2 days, now narrowed to levaquin and completed course (concern for Cardiac SE of drug and Afib RVR) - GI Consulting - appprecuiate reccs  - Flex sig on 09/05/2012- circumferential mass nearly obstructing the colon 35 cm in  -  Cdiff negative, Stool culture pending - Surgery consulting - appreciate reccs and management - Oncology consulting, appreciate recommendations.  Called Dr. Clelia Croft to please come evaluate the patient and go over the new information and regards for prognosis with the family.  - CEA is 59.3, port for expected chemo, agree with surgery - PRN dilaudid for  pain will change to morphine, will add back tramadol scheduled  Afib with RVR - Resolved with NSR and frequent PVCs on Telemetry -   -Placed on Dilt drip, now on Toprol XL 50 mg per cards  -ICD placement being consider, glady appreciate cards recs  -No anticoagulation due to bleeding risk   Anemia - Hgb 10.6, stable over the last few hospital days (admission 13.0) - some expected blood loss anemia and was previously having BRB per rectum before surgery.  - monitor daily CBC  Nausea - PRN zofran, change to morphine + scheduled tramadol  Pneumonia- CAP - Now resolved  - Afebrile  - Normal work of breathinig - Levaquin Completed  - Blood cultures NGTD  UTI - UA on 2/13 showing Large LE, U cx with no growth, completed Levaquin - Did pass large amount of blood yesterday.  Urology following, appreciate recommendations  CAD, CHF, Hx of MI, CHF -Cardiology consulting- appreciate reccs.   - Continue IMDUR, metoprolol succ 25 daily  - TTE with 35-40% and grade 1 diastolic dysf  - Hold ASA and statin  HTN - BP stable - Hold home: HCTZ, benzapril due to UOP, will consider adding back on if BP starts to become elevated  - IMDUR, BB started/addded per cards  HLD - hold simvastatin  DM2 - CBGs: 105-117, pt refusing CBGs at this point  - A1C 6.2 - SSI, Q8 checks  BPH - Continue doxazosin  Hypokalemia and Hypomagnesium  -Stable  -Continue to monitor   FEN/GI: 1/2NS +20KCl @ 100 cc/hr.  Advance Diet today to full liquids.  Consider decreasing fluids today.  PPx: Heparin SQ   Dispo: Home after surgical intervention and oncology recommendations.  Code Status: DNR  Gildardo Cranker, DO 09/12/2012, 9:51 AM

## 2012-09-12 NOTE — Care Management Note (Signed)
    Page 1 of 2   09/14/2012     10:52:52 AM   CARE MANAGEMENT NOTE 09/14/2012  Patient:  Todd [name], Todd [name]   Account Number:  [id number]  Date Initiated:  09/11/2012  Documentation initiated by:  Alvira Philips Assessment:   79 yr-old male w/metastatic colon CA; lives with spouse, independent PTA     Action/Plan:   Anticipated DC Date:  09/14/2012   Anticipated DC Plan:  HOME W HOME HEALTH SERVICES         [facility] Choice  HOME HEALTH   Choice offered to / List presented to:  C-1 Patient        HH arranged  HH-2 PT  HH-3 OT  HH-4 NURSE'S AIDE  HH-1 RN      [facility] agency  [facility]   Status of service:  Completed, signed off Medicare Important Message given?   (If response is "NO", the following Medicare IM given date fields will be blank) Date Medicare IM given:   Date Additional Medicare IM given:    Discharge Disposition:  HOME W HOME HEALTH SERVICES  Per UR Regulation:  Reviewed for med. necessity/level of care/duration of stay  If discussed at Long Length of Stay Meetings, dates discussed:    Comments:  PCP: Dr Paula Compton  09/14/12 10:50 Letha Cape RN, BSN [phone] patient for dc today, [facility] notified.  Patient has a new colostomy.  09/13/12 12:02 Letha Cape RN, BSN [phone] patient  informed Jill Alexanders with [facility] that he would like to have rolling walker, bsc, and w/chair delivered to his home.  Patient will have a reg diet today, will see how he tolerates and possible dc tomorrow.  Patient states he will let me know if he still wants DME delivered to his home or if he wants to take it with him.  09/12/12 12:01 Letha Cape RN, BSN [phone] patient chose [facility] , states he is already with them,  I called Lupita Leash with [facility] she states NCM gave them this referral yesterday for [facility], PT, OT and aide.  Soc will begin 24-48 hrs post discharge.  Patient is post op day 5 of lap colon resection, need to advance diet, make sure bowel is functioning,  patient ambulating etc.  09/11/12 0940 Henrietta Mayo RN BSN MSN CCM PT recommends home therapy, pt will also need OT and RN. Discussed home health services as well as equipment needs with pt and son.  Pt has a friend who will loan him a rolling walker, son states pt has a shower stool and he plans to arrange for safety rails.  Pt lives in a one-story home and there is a hand rail for the steps leading into the home.  Provided list of home health agencies, spouse states dtr is a CNA and she wants to get her advice before choosing agency.

## 2012-09-13 DIAGNOSIS — I10 Essential (primary) hypertension: Secondary | ICD-10-CM

## 2012-09-13 DIAGNOSIS — I5020 Unspecified systolic (congestive) heart failure: Secondary | ICD-10-CM

## 2012-09-13 LAB — CBC
Hemoglobin: 10.4 g/dL — ABNORMAL LOW (ref 13.0–17.0)
MCH: 28.8 pg (ref 26.0–34.0)
MCV: 84.2 fL (ref 78.0–100.0)
Platelets: 272 10*3/uL (ref 150–400)
RBC: 3.61 MIL/uL — ABNORMAL LOW (ref 4.22–5.81)
WBC: 8.9 10*3/uL (ref 4.0–10.5)

## 2012-09-13 LAB — BASIC METABOLIC PANEL
CO2: 28 mEq/L (ref 19–32)
Chloride: 100 mEq/L (ref 96–112)
Creatinine, Ser: 0.7 mg/dL (ref 0.50–1.35)
Glucose, Bld: 124 mg/dL — ABNORMAL HIGH (ref 70–99)
Sodium: 135 mEq/L (ref 135–145)

## 2012-09-13 MED ORDER — PREDNISONE 20 MG PO TABS
40.0000 mg | ORAL_TABLET | Freq: Every day | ORAL | Status: DC
  Filled 2012-09-13: qty 2

## 2012-09-13 MED ORDER — PREDNISONE 20 MG PO TABS
30.0000 mg | ORAL_TABLET | Freq: Once | ORAL | Status: AC
  Administered 2012-09-14: 30 mg via ORAL
  Filled 2012-09-13: qty 1

## 2012-09-13 NOTE — Plan of Care (Signed)
Problem: Food- and Nutrition-Related Knowledge Deficit (NB-1.1) Goal: Nutrition education Formal process to instruct or train a patient/client in a skill or to impart knowledge to help patients/clients voluntarily manage or modify food choices and eating behavior to maintain or improve health. Outcome: Completed/Met Date Met:  09/13/12 Nutrition Education Note:   Pt asked for information about diet. Pt s/p colostomy placement. RD provided pt with "Low Fiber Nutrition Therapy" hand out and discussed foods low in fiber to eat and high in fiber to avoid. Also talked about foods to avoid that can cause gas or foul smells. Pt encouraged to increase fiber slowly over a period of several weeks as tolerated and to introduce foods one at a time to assess tolerance. Pt encouraged to avoid using straws, chewing gum, or eating to quickly to reduce air intake into the stomach. Pt, wife, and son in room, verbalized understanding. Teach back method used.    Body mass index is 32.27 kg/(m^2). Obesity class 1 Current Diet: Regular. May benefit from a low fiber diet. RD encouraged pt to chose low fiber choices and discussed lunch order.   Chart reviewed, no additional nutrition interventions warranted at this time. Please re-consult as needed.    Clarene Duke RD, LDN Pager [phone] After Hours pager [phone]

## 2012-09-13 NOTE — Progress Notes (Signed)
6 Days Post-Op  Subjective: Doing well Tolerating full liquids  Objective: Vital signs in last 24 hours: Temp:  [97.6 F (36.4 C)-98.3 F (36.8 C)] 97.6 F (36.4 C) (02/19 0529) Pulse Rate:  [62-75] 65 (02/19 0529) Resp:  [16-18] 18 (02/19 0529) BP: (130-145)/(49-68) 144/68 mmHg (02/19 0529) SpO2:  [96 %-98 %] 97 % (02/19 0529) Last BM Date: 09/12/12  Intake/Output from previous day: 02/18 0701 - 02/19 0700 In: 1847 [P.O.:1847] Out: 900 [Urine:900] Intake/Output this shift:    Abdomen soft, ostomy working well  Lab Results:   Recent Labs  09/12/12 0500 09/13/12 0510  WBC 7.4 8.9  HGB 10.6* 10.4*  HCT 30.7* 30.4*  PLT 263 272   BMET  Recent Labs  09/12/12 0500 09/13/12 0510  NA 136 135  K 4.1 3.6  CL 99 100  CO2 27 28  GLUCOSE 153* 124*  BUN 12 16  CREATININE 0.68 0.70  CALCIUM 8.7 8.8   PT/INR No results found for this basename: LABPROT, INR,  in the last 72 hours ABG No results found for this basename: PHART, PCO2, PO2, HCO3,  in the last 72 hours  Studies/Results: No results found.  Anti-infectives: Anti-infectives   Start     Dose/Rate Route Frequency Ordered Stop   09/06/12 1930  levofloxacin (LEVAQUIN) IVPB 750 mg  Status:  Discontinued     750 mg 100 mL/hr over 90 Minutes Intravenous Every 24 hours 09/06/12 1812 09/10/12 1203   09/05/12 1200  ciprofloxacin (CIPRO) IVPB 400 mg  Status:  Discontinued     400 mg 200 mL/hr over 60 Minutes Intravenous Every 12 hours 09/05/12 0404 09/06/12 1812   09/05/12 1000  metroNIDAZOLE (FLAGYL) IVPB 500 mg  Status:  Discontinued     500 mg 100 mL/hr over 60 Minutes Intravenous Every 8 hours 09/05/12 0404 09/08/12 1103   09/05/12 0115  ciprofloxacin (CIPRO) IVPB 400 mg     400 mg 200 mL/hr over 60 Minutes Intravenous  Once 09/05/12 0101 09/05/12 0256   09/05/12 0115  metroNIDAZOLE (FLAGYL) IVPB 500 mg     500 mg 100 mL/hr over 60 Minutes Intravenous  Once 09/05/12 0101 09/05/12 0225       Assessment/Plan: s/p Procedure(s): LAPAROSCOPIC ASSISTED SIGMOID COLON RESECTION END OSTOMY (N/A) Placement of power port (N/A)  Will start regular diet. Ready for d/c from gen surg standpoint  LOS: 9 days    Todd [name] A 09/13/2012

## 2012-09-13 NOTE — Progress Notes (Signed)
Family Medicine Teaching Service Daily Progress Note Service Page: [phone]  Subjective:  Pt doing well today, advancing his diet to full liquids.    Objective: Temp:  [97.6 F (36.4 C)-98.3 F (36.8 C)] 97.6 F (36.4 C) (02/19 0529) Pulse Rate:  [62-75] 65 (02/19 0529) Resp:  [16-18] 18 (02/19 0529) BP: (130-145)/(49-68) 144/68 mmHg (02/19 0529) SpO2:  [96 %-98 %] 97 % (02/19 0529) Exam: Gen: NAD, alert, cooperative with exam, sitting in chair HEENT: NCAT, EOMI, PERRL CV: RRR, 2-3/6 systolic murmur Resp: Some scattered wheezes, no crackles. non-labored Abd: slightly tense, tender to palpation throughout., ostomy in place. BS hypoactive Ext: 1+ edema, warm Neuro: Alert and oriented, No gross deficits  I have reviewed the patient's medications, labs, imaging, and diagnostic testing.  Notable results are summarized below.  CBC BMET   Recent Labs Lab 09/11/12 0540 09/12/12 0500 09/13/12 0510  WBC 8.5 7.4 8.9  HGB 10.6* 10.6* 10.4*  HCT 30.5* 30.7* 30.4*  PLT 230 263 272    Recent Labs Lab 09/11/12 0540 09/12/12 0500 09/13/12 0510  NA 135 136 135  K 3.9 4.1 3.6  CL 99 99 100  CO2 25 27 28   BUN 14 12 16   CREATININE 0.72 0.68 0.70  GLUCOSE 141* 153* 124*  CALCIUM 8.5 8.7 8.8     Imaging/Diagnostic Tests: CT abd/Pelvis with 09/05/2012 IMPRESSION:  1. Suspect primary colonic malignancy at the mid sigmoid colon,  measuring 5.2 x 4.7 x 4.1 cm, with scattered small adjacent nodules  reflecting local spread of disease. No evidence for obstruction;  no perforation seen. Trace associated free fluid noted.  2. Mild diffuse wall thickening involving the proximal sigmoid  colon may reflect chronic inflammation due to the more distal mass;  the remaining colon is unremarkable.  3. Multiple mildly heterogeneous slightly ill-defined masses  throughout the right hepatic lobe, measuring up to 11.5 cm,  compatible with metastatic disease.  4. 2.4 x 1.5 cm peripancreatic  node demonstrates mild low  attenuation, likely reflecting metastatic disease.  5. 2.6 cm adrenal mass may reflect metastatic disease, though it  is nonspecific in appearance.  6. Focal left basilar airspace opacification may reflect  atelectasis or possibly pneumonia.  7. Diffuse coronary artery calcifications seen.  8. Bilateral renal cysts, particularly prominent on the left side.  9. Diffuse calcification along the abdominal aorta and its  branches.  10. Markedly enlarged prostate noted, measuring 7.7 cm in size.  11. Diffuse degenerative change noted along the lumbar spine.  CXR 09/06/2012 IMPRESSION:  1. Suspected lower lobe airspace disease may represent early  pneumonia.  2. Low lung volumes and basilar atelectasis.  3. Mild cardiomegaly.  CXR 09/08/2012 IMPRESSION:  Right port has been placed. The tip appears be at the level of the  cavoatrial junction and possibly proximal right atrium. This can  be assessed on follow-up two-view exam when the patient is able.  Plan: 79 y/o male here with acute onset nausea, vomiting, and diarrhea, found to have colonic mass on CT scan.   Adenocarcinoma Sigmoid Colon, now S/p sigmoid colectomy and power port placement day 5  - Metastsatic GI Ca to the Liver, Biopsy results show invasive adenocarcinoma from tissue Bx on 2/11  -Tx with Cipro/Flagyl initially.   - GI Consulting - appprecuiate reccs  - Flex sig on 09/05/2012- circumferential mass nearly obstructing the colon 35 cm in  - Cdiff negative, Stool culture pending  - Surgery consulting - appreciate reccs and management.  Stable and ready  for d/c from their standpoint.   - Oncology consulting, appreciate recommendations.  On 09/12/12, called Dr. Clelia Croft to please come evaluate the patient and go over the new information and regards for prognosis with the family.  - CEA is 59.3, port for expected chemo, agree with surgery  - PRN dilaudid for pain will change to morphine, will add back  tramadol scheduled  -Home Health PT/OT/Home Health Aide, RN, and supplies ordered   Afib with RVR - Resolved with NSR and frequent PVCs on Telemetry -   -Placed on Dilt drip, now on Toprol XL 50 mg per cards  -ICD placement being consider, glady appreciate cards recs  -No anticoagulation due to bleeding risk  Gout - Pt has longstanding Hx of gout and seems to have acute flare.    -Pt completed three day course of prednisone  -Is also on HCTZ and Voltaren to counteract this.  Not interested at this point in changing this.  Will defer to outpatient setting and make not in d/c summary.    Anemia  - Hgb 10.4, stable over the last few hospital days (admission 13.0)  - some expected blood loss anemia and was previously having BRB per rectum before surgery.  - Can consider stopping checking these  Nausea - PRN zofran, Morphine,Tramadol  Pneumonia- CAP - Now resolved  - Afebrile  - Normal work of breathinig - Levaquin Completed  - Blood cultures NGTD  UTI - UA on 2/13 showing Large LE, U cx with no growth, completed Levaquin - Will need Urology f/u in outpatient setting  CAD, CHF, Hx of MI, CHF  -Cardiology consulting- appreciate reccs.   - Continue IMDUR, metoprolol succ 25 daily  - TTE with 35-40% and grade 1 diastolic dysf   - Hold ASA and statin  HTN  - BP stable  - Hold home: HCTZ, benzapril due to UOP, will consider adding back on if BP starts to become elevated   - IMDUR, BB started/addded per cards  HLD  - hold simvastatin  DM2  - CBGs: 105-117, no longer checking  - A1C 6.2    BPH  - Continue doxazosin  Hypokalemia and Hypomagnesium  -Stable  -Continue to monitor   FEN/GI:  Advance Diet today to Full today.  PPx: Heparin SQ   Dispo: Home after surgical intervention and oncology recommendations. Will consider D/C today.  Code Status: DNR  Gildardo Cranker, DO 09/13/2012, 8:51 AM

## 2012-09-13 NOTE — Progress Notes (Signed)
FMTS Attending Daily Note:  Todd Don MD  [phone] pager  Family Practice pager:  [phone] I have seen and examined this patient and have reviewed their chart. I have discussed this patient with the resident. I agree with the resident's findings, assessment and care plan.  Additionally: - Gout much improved. - Trial of regular diet today.  - Home health PT/OT/nursing being established. - May need fewer anti-hypertensives as outpatient.  A1C is pre-diabetic but not in diabetic range, goal is <150 which he has been.  Again discussed stopping HCTZ with him to lower risk of gout but he is not inclined to do to.  Defer to outpt management - Plan for DC home tomorrow.

## 2012-09-13 NOTE — Progress Notes (Signed)
Occupational Therapy Treatment Patient Details Name: Javaris Wigington MRN: [id number] DOB: 02-02-1930 Today's Date: 09/13/2012 Time: 4098-1191 OT Time Calculation (min): 39 min  OT Assessment / Plan / Recommendation Comments on Treatment Session This 79 yo male making progess, will benefit from contiued OT at home.    Follow Up Recommendations  Home health OT;Supervision/Assistance - 24 hour       Equipment Recommendations  3 in 1 bedside comode       Frequency Min 3X/week   Plan Discharge plan remains appropriate    Precautions / Restrictions Precautions Precautions: Fall Precaution Comments: colostomy bag Restrictions Weight Bearing Restrictions: No       ADL  Lower Body Dressing: Performed;Set up;Supervision/safety (doff/don socks with effort without sock aide; then let him p) Where Assessed - Lower Body Dressing: Supported sitting ADL Comments: Went over how he should be able to position colostomy bag in a different direction if he desires perhaps helping him with ease of emptying it (he is to ask ostomy nurse). Also to ask Ostomy nurse about showering with bag--if needs to keep dry then Press and [employer] works well. Explained to pt and family in room how to burp the bag when needed' as well as how to rinse out the bag when emptying it. Pt was not sure how often bag or adhesive pad (at abdoment) needed to be changed--I told him to ask the ostomy nurse this as well. We did talk about energy conservation techniques and I gave him a handout.. Advised him and family that once he gets home and gets started in his routine to then write down any issues that he is having so that he would be sure to ask any questions of the OT when then come out to evaluate him.      OT Goals ADL Goals ADL Goal: Lower Body Dressing - Progress: Met  Visit Information  Last OT Received On: 09/13/12 Assistance Needed: +1    Subjective Data  Subjective: I have some questions about this bag  (colostomy)      Cognition  Cognition Overall Cognitive Status: Appears within functional limits for tasks assessed/performed Orientation Level: Appears intact for tasks assessed Behavior During Session: [facility] for tasks performed             End of Session OT - End of Session Equipment Utilized During Treatment:  (sock aide, gave he and family info on where to get one) Activity Tolerance: Patient tolerated treatment well Patient left: in chair;with family/visitor present       Evette Georges [phone] 09/13/2012, 4:07 PM

## 2012-09-14 DIAGNOSIS — E872 Acidosis: Secondary | ICD-10-CM

## 2012-09-14 MED ORDER — HEPARIN SOD (PORK) LOCK FLUSH 100 UNIT/ML IV SOLN
500.0000 [IU] | INTRAVENOUS | Status: AC | PRN
  Administered 2012-09-14: 500 [IU]

## 2012-09-14 MED ORDER — PREDNISONE (PAK) 10 MG PO TABS: 10.0000 mg | ORAL_TABLET | Freq: Every day | ORAL | Status: DC

## 2012-09-14 MED ORDER — ENSURE COMPLETE PO LIQD: 237.0000 mL | Freq: Two times a day (BID) | ORAL | Status: DC

## 2012-09-14 NOTE — Progress Notes (Signed)
Todd [name] Patient discharged Home with H/H per MD order.  Discharge instructions reviewed and discussed with the patient, all questions and concerns answered. Copy of instructions and scripts given to patient.  Explained to pt. On how to sign up for Mychart.      Medication List    STOP taking these medications       benazepril 40 MG tablet  Commonly known as:  LOTENSIN     felodipine 5 MG 24 hr tablet  Commonly known as:  PLENDIL      TAKE these medications       aspirin 81 MG tablet  Take 81 mg by mouth daily.     colchicine 0.6 MG tablet  Take 0.6 mg by mouth daily as needed (for gout flares).     diclofenac 75 MG EC tablet  Commonly known as:  VOLTAREN  Take 75 mg by mouth 2 (two) times daily.     doxazosin 8 MG tablet  Commonly known as:  CARDURA  Take 8 mg by mouth daily.     feeding supplement Liqd  Take 237 mLs by mouth 2 (two) times daily between meals.     FISH OIL DOUBLE STRENGTH 1200 MG Caps  Take 1 capsule by mouth 2 (two) times daily.     gabapentin 300 MG capsule  Commonly known as:  NEURONTIN  Take 1 capsule (300 mg total) by mouth 3 (three) times daily.     hydrochlorothiazide 25 MG tablet  Commonly known as:  HYDRODIURIL  Take 1 tablet (25 mg total) by mouth daily.     isosorbide mononitrate 60 MG 24 hr tablet  Commonly known as:  IMDUR  Take 1 tablet (60 mg total) by mouth daily.     loratadine 10 MG tablet  Commonly known as:  CLARITIN  Take 10 mg by mouth daily.     metoprolol succinate 50 MG 24 hr tablet  Commonly known as:  TOPROL-XL  Take 50 mg by mouth daily. Take with or immediately following a meal.     multivitamin tablet  Take 1 tablet by mouth daily.     nitroGLYCERIN 0.4 MG SL tablet  Commonly known as:  NITROSTAT  Place 0.4 mg under the tongue every 5 (five) minutes as needed.     psyllium 58.6 % powder  Commonly known as:  METAMUCIL  Take 1 packet by mouth 2 (two) times daily.     simvastatin 40 MG tablet  Commonly known  as:  ZOCOR  Take 1 tablet (40 mg total) by mouth at bedtime.     ZOSTAVAX 47829 UNT/0.65ML injection  Generic drug:  zoster vaccine live (PF)  Inject into the skin once. Dispense to patient to be admin in doctors office        Patients skin is clean, dry and intact, no evidence of skin break down, except for surgical site with how CDI and colostomy to left lower quadrant. IV site discontinued by IV team and catheter remains intact. Site without signs and symptoms of complications. Dressing and pressure applied.  Patient escorted to car by NT in a wheelchair,  no distress noted upon discharge.  Riffe, Sanayah Munro C 09/14/2012 3:20 PM

## 2012-09-14 NOTE — Progress Notes (Signed)
Family Medicine Teaching Service Daily Progress Note Service Page: [phone]  Subjective:  Pt able to tolerate full diet yesterday, ready to go home today.     Objective: Temp:  [97.7 F (36.5 C)-98.1 F (36.7 C)] 97.7 F (36.5 C) (02/20 0539) Pulse Rate:  [61-78] 61 (02/20 0539) Resp:  [20] 20 (02/20 0539) BP: (136-146)/(61-74) 146/74 mmHg (02/20 0539) SpO2:  [95 %-96 %] 96 % (02/20 0539) Exam: Gen: NAD HEENT: NCAT, EOMI, PERRL CV: RRR, 2-3/6 systolic murmur Resp: CTA Abd: ostomy in place. NABS Ext: 1+ edema, warm Neuro: Alert and oriented, No gross deficits  I have reviewed the patient's medications, labs, imaging, and diagnostic testing.  Notable results are summarized below.  CBC BMET   Recent Labs Lab 09/11/12 0540 09/12/12 0500 09/13/12 0510  WBC 8.5 7.4 8.9  HGB 10.6* 10.6* 10.4*  HCT 30.5* 30.7* 30.4*  PLT 230 263 272    Recent Labs Lab 09/11/12 0540 09/12/12 0500 09/13/12 0510  NA 135 136 135  K 3.9 4.1 3.6  CL 99 99 100  CO2 25 27 28   BUN 14 12 16   CREATININE 0.72 0.68 0.70  GLUCOSE 141* 153* 124*  CALCIUM 8.5 8.7 8.8     Imaging/Diagnostic Tests: CT abd/Pelvis with 09/05/2012 IMPRESSION:  1. Suspect primary colonic malignancy at the mid sigmoid colon,  measuring 5.2 x 4.7 x 4.1 cm, with scattered small adjacent nodules  reflecting local spread of disease. No evidence for obstruction;  no perforation seen. Trace associated free fluid noted.  2. Mild diffuse wall thickening involving the proximal sigmoid  colon may reflect chronic inflammation due to the more distal mass;  the remaining colon is unremarkable.  3. Multiple mildly heterogeneous slightly ill-defined masses  throughout the right hepatic lobe, measuring up to 11.5 cm,  compatible with metastatic disease.  4. 2.4 x 1.5 cm peripancreatic node demonstrates mild low  attenuation, likely reflecting metastatic disease.  5. 2.6 cm adrenal mass may reflect metastatic disease, though it   is nonspecific in appearance.  6. Focal left basilar airspace opacification may reflect  atelectasis or possibly pneumonia.  7. Diffuse coronary artery calcifications seen.  8. Bilateral renal cysts, particularly prominent on the left side.  9. Diffuse calcification along the abdominal aorta and its  branches.  10. Markedly enlarged prostate noted, measuring 7.7 cm in size.  11. Diffuse degenerative change noted along the lumbar spine.  CXR 09/06/2012 IMPRESSION:  1. Suspected lower lobe airspace disease may represent early  pneumonia.  2. Low lung volumes and basilar atelectasis.  3. Mild cardiomegaly.  CXR 09/08/2012 IMPRESSION:  Right port has been placed. The tip appears be at the level of the  cavoatrial junction and possibly proximal right atrium. This can  be assessed on follow-up two-view exam when the patient is able.  Plan: 79 y/o male here with acute onset nausea, vomiting, and diarrhea, found to have colonic mass on CT scan.   Adenocarcinoma Sigmoid Colon, now S/p sigmoid colectomy and power port placement day 5  - Metastsatic GI Ca to the Liver, Biopsy results show invasive adenocarcinoma from tissue Bx on 2/11  -Tx with Cipro/Flagyl initially.   - GI Consulting - appprecuiate reccs  - Flex sig on 09/05/2012- circumferential mass nearly obstructing the colon 35 cm in  - Cdiff negative, Stool culture pending  - Surgery consulting - appreciate reccs and management.  Stable and ready for d/c from their standpoint.   - Oncology consulting, appreciate recommendations.  On 09/12/12, called  Dr. Clelia Croft to please come evaluate the patient and go over the new information and regards for prognosis with the family.  - CEA is 59.3, port for expected chemo, agree with surgery  - PRN dilaudid for pain will change to morphine, will add back tramadol scheduled  -Home Health PT/OT/Home Health Aide, RN, and supplies ordered   Afib with RVR - Resolved with NSR and frequent PVCs on  Telemetry -   -Placed on Dilt drip, now on Toprol XL 50 mg per cards  -ICD placement being consider, glady appreciate cards recs  -No anticoagulation due to bleeding risk  Gout - Pt has longstanding Hx of gout.  Acute attack seems to be resolved.   -Pt completed three day course of prednisone.  Will do 4 day taper.   -Is also on HCTZ and Voltaren to counteract this.  Not interested at this point in changing this.  Will defer to outpatient setting and make not in d/c summary.    Normocytic Anemia  - Stable around 10 Nausea - PRN zofran, Morphine,Tramadol  Pneumonia- CAP - Now resolved   UTI - UA on 2/13 showing Large LE, U cx with no growth, completed Levaquin - Will need Urology f/u in outpatient setting  CAD, CHF, Hx of MI, CHF  -Cardiology consulting- appreciate reccs.   - Continue IMDUR, metoprolol succ 25 daily  - TTE with 35-40% and grade 1 diastolic dysf   - Hold ASA and statin  HTN  - BP stable  - Restart home HCTZ, will d/c ACE on d/c   - IMDUR, BB started/addded per cards  HLD  - hold simvastatin, restart on d/c  DM2  - CBGs: 105-117, no longer checking  - A1C 6.2   BPH  - Continue doxazosin  Hypokalemia and Hypomagnesium  -Stable  -Continue to monitor   FEN/GI:  Diet Full  PPx: Heparin SQ   Dispo: Home today w/ home health Code Status: DNR  Judie Grieve R. Paulina Fusi, DO of [facility] 09/14/2012, 8:48 AM

## 2012-09-14 NOTE — Progress Notes (Signed)
FMTS Attending Daily Note:  Renold Don MD  [phone] pager  Family Practice pager:  [phone] I have seen and examined this patient and have reviewed their chart. I have discussed this patient with the resident. I agree with the resident's findings, assessment and care plan.  Patient doing well.  Answered all questions.  Ready for DC home.  FU already established.

## 2012-09-14 NOTE — Consult Note (Addendum)
WOC ostomy consult  Stoma type/location: Pt had colostomy surgery to left lower quad on 2/13.  Initial WOC consult requested today.   Stomal assessment/size: Stoma red and viable, 1 1/4 inches, above skin level to upper area, but from 5 o'clock to 7 o'clock where os is located, stoma is only slightly above skin level. Peristomal assessment: Intact skin surrounding Output 50cc semi-formed brown stool Ostomy pouching: 1pc. with barrier ring.  Education provided: Demonstrated pouch application to pt, wife and son at bedside. Used one piece with barrier ring placed to lower half of stoma. Pt able to open, empty and close velcro.  Wife assisted with pouch application.  Educational materials left at bedside and pt placed on [facility] discharge program.  Plans to be followed by home health.  Discussed ordering supplies and pouching routines.  Pt denies further questions. 3 pouches and 3 barrier rings given to pt.   Cammie Mcgee, RN, MSN, [employer]  [phone]

## 2012-09-14 NOTE — Progress Notes (Signed)
Patient ID: Todd [name], male   DOB: 27-Apr-1930, 79 y.o.   MRN: [id number]  Doing well  Abdomen soft, incision clean  Ready for discharge  F/u with Dr. Ezzard Standing next week.

## 2012-09-15 ENCOUNTER — Telehealth: Payer: Self-pay | Admitting: Oncology

## 2012-09-15 ENCOUNTER — Telehealth (INDEPENDENT_AMBULATORY_CARE_PROVIDER_SITE_OTHER): Payer: Self-pay

## 2012-09-15 NOTE — Discharge Summary (Signed)
Family Medicine Teaching Service  Discharge Note : Attending Jeff Contrell Ballentine MD Pager [phone] Inpatient Team Pager:  [phone]  I have seen and examined this patient, reviewed their chart and discussed discharge planning with the resident at the time of discharge. I agree with the discharge plan as above.  

## 2012-09-15 NOTE — Telephone Encounter (Signed)
The pt needs to come in and see Dr Ezzard Standing next week for staple removal.  Please call.

## 2012-09-15 NOTE — Telephone Encounter (Signed)
emailed Tiffany " Pt just released from hospital and needs f/u with Dr. Clelia Croft..please call [phone].. The pt would like an appt by 2.28.14 if pissible

## 2012-09-18 ENCOUNTER — Telehealth: Payer: Self-pay | Admitting: Oncology

## 2012-09-18 ENCOUNTER — Encounter (INDEPENDENT_AMBULATORY_CARE_PROVIDER_SITE_OTHER): Payer: Self-pay

## 2012-09-18 ENCOUNTER — Ambulatory Visit (INDEPENDENT_AMBULATORY_CARE_PROVIDER_SITE_OTHER): Payer: Medicare Other

## 2012-09-18 VITALS — BP 126/76 | HR 68 | Temp 98.0°F | Resp 18 | Ht 70.0 in | Wt 210.0 lb

## 2012-09-18 DIAGNOSIS — Z4802 Encounter for removal of sutures: Secondary | ICD-10-CM

## 2012-09-18 NOTE — Telephone Encounter (Signed)
S/W PT WIFE IN REF TO HOSP. F/U ON 09/20/12@8 :00 DELIVERED CHART 09/18/12

## 2012-09-18 NOTE — Progress Notes (Signed)
Abdominal incision staples intact; No redness, no drainage noted. Cleansed area with Chlora prep ; Removed 10 staples; applied steri strep adhesive to area ; applied 8 steri streps to area ; Patient tolerated well; educated patient on keeping the clean and dry, S/S of infection. Patient verbalized understanding. He will return for Post op on  09/22/12 5:15pm with Dr. Ezzard Standing

## 2012-09-19 ENCOUNTER — Encounter: Payer: Self-pay | Admitting: Family Medicine

## 2012-09-19 ENCOUNTER — Ambulatory Visit (INDEPENDENT_AMBULATORY_CARE_PROVIDER_SITE_OTHER): Payer: Medicare Other | Admitting: Family Medicine

## 2012-09-19 VITALS — BP 142/66 | HR 68 | Ht 70.0 in | Wt 209.5 lb

## 2012-09-19 DIAGNOSIS — C189 Malignant neoplasm of colon, unspecified: Secondary | ICD-10-CM

## 2012-09-19 DIAGNOSIS — I10 Essential (primary) hypertension: Secondary | ICD-10-CM

## 2012-09-19 DIAGNOSIS — M109 Gout, unspecified: Secondary | ICD-10-CM

## 2012-09-19 DIAGNOSIS — C787 Secondary malignant neoplasm of liver and intrahepatic bile duct: Secondary | ICD-10-CM

## 2012-09-19 MED ORDER — BENAZEPRIL HCL 10 MG PO TABS: 10.0000 mg | ORAL_TABLET | Freq: Every day | ORAL | Status: DC

## 2012-09-19 NOTE — Assessment & Plan Note (Signed)
Hospital follow up after resection of colon mass; he reports no abdominal pain and no nausea/vomiting.  Appetite still sluggish.  Plan to have oncology office visit tomorrow to discuss treatment options, insists he is more interested in quality of life at this time.  Will discuss further after he meets with Dr Juliette Alcide and considers the options presented to him.

## 2012-09-19 NOTE — Progress Notes (Signed)
  Subjective:    Patient ID: Todd [name], male    DOB: 07/16/1930, 79 y.o.   MRN: [id number]  HPI Todd [name] is seen today in follow up after recent hospitalization.  He is accompanied by his wife, Todd [name].  Todd. [name] reports that he has been feeling well; he and wife have become more comfortable in caring for ostomy.  Had follow up with surgery yesterday for staple removal, has another follow up coming up.  To see Dr Shaddad/Oncology tomorrow and he and wife have many questions about the options they may be presented with.  Most important to Todd [name] is quality of life; he stresses that he is a 'fatalist' and is most interested in avoiding suffering.  He states he has a living will that states that he would not wish to be intubated, defibrillated or have CPR performed, and that these remain his wishes.  His wife, here present, is in agreement with this.  He plans to forward this document to me to be scanned into his EMR record.  His gouty attack of the left ankle has improved markedly, not painful anymore.  We discussed again the idea of stopping the HCTZ that he takes for BP, but he would rather leave his antihypertensive regimen as it is and risk an occasional gout flare.   Getting around with rolling walker.  No falls at home. Home PT coming to the house.  Family had planned to go on a cruise, he requests that I fill out a cancellation form for medical reasons.  Also, a permanent handicapped parking placard, which is completed before they leave the office today and returned to the patient.   Review of Systems See above     Objective:   Physical Exam Alert, in good spirits, oriented, and in no apparent distress HEENT Neck supple.  EXTS: 1+ bilateral ankle edema.  No erythema of L ankle, full active ROM with dorsi-plantar flexion.  Sensation grossly intact in distal toes on L. NEURO ambulates with rolling walker.        Assessment & Plan:

## 2012-09-19 NOTE — Assessment & Plan Note (Signed)
Reviewed medications, no changes at present.

## 2012-09-19 NOTE — Patient Instructions (Addendum)
It was a pleasure to see you today. I will be in touch after your meeting with Dr Juliette Alcide from oncology.   Living Will/Advance Directive form faxed to my office or dropped off when you pick up the cruise cancellation form.

## 2012-09-19 NOTE — Assessment & Plan Note (Signed)
The gouty flare of L ankle is much improved. Completed prednisone course.  He does not wish to stop the HCTZ that is given for his blood pressure.

## 2012-09-20 ENCOUNTER — Other Ambulatory Visit: Payer: Medicare Other | Admitting: Lab

## 2012-09-20 ENCOUNTER — Ambulatory Visit: Payer: Medicare Other

## 2012-09-20 ENCOUNTER — Ambulatory Visit (HOSPITAL_BASED_OUTPATIENT_CLINIC_OR_DEPARTMENT_OTHER): Payer: Medicare Other | Admitting: Oncology

## 2012-09-20 ENCOUNTER — Encounter: Payer: Self-pay | Admitting: Oncology

## 2012-09-20 VITALS — BP 149/67 | HR 69 | Temp 98.8°F | Resp 20 | Ht 70.0 in | Wt 206.3 lb

## 2012-09-20 DIAGNOSIS — C187 Malignant neoplasm of sigmoid colon: Secondary | ICD-10-CM

## 2012-09-20 DIAGNOSIS — C189 Malignant neoplasm of colon, unspecified: Secondary | ICD-10-CM

## 2012-09-20 NOTE — Progress Notes (Signed)
Hematology and Oncology Follow Up Visit  Todd [name] [id number] [birth year] 79 y.o. 09/20/2012 9:58 AM [name], MDBreen, Todd Lamas, MD   Principle Diagnosis: 79 year old with stage IV colon cancer diagnosed in 08/2012. He presented with a colonic mass and liver lesions.    Prior Therapy: He is S/P LAPAROSCOPIC ASSISTED SIGMOID COLON RESECTION, END Left OSTOMY, Hartmann's pouch, Placement of power port done on 09/07/2012.   Current therapy: under evaluation for possible systemic therapy.   Interim History: [name] presents today for a follow up visit. He is a very nice man I saw in consultation in the hospital for stage IV colon cancer. He under went surgical resection on 09/07/2012 and has been doing well.  The pathology reviewed and showed T4bN2M1 disease (5/20 lymph nodes noted). Clinically, he is recovering slowly but is doing better every day.  He is eating better and have resumed most activity of daily living.    Medications: I have reviewed the patient's current medications. Current outpatient prescriptions:aspirin 81 MG tablet, Take 81 mg by mouth daily.  , Disp: , Rfl: ;  benazepril (LOTENSIN) 10 MG tablet, Take 1 tablet (10 mg total) by mouth daily., Disp: 90 tablet, Rfl: 3;  colchicine 0.6 MG tablet, Take 0.6 mg by mouth daily as needed (for gout flares)., Disp: , Rfl: ;  diclofenac (VOLTAREN) 75 MG EC tablet, Take 75 mg by mouth 2 (two) times daily., Disp: , Rfl:  doxazosin (CARDURA) 8 MG tablet, Take 8 mg by mouth daily. , Disp: , Rfl: ;  feeding supplement (ENSURE COMPLETE) LIQD, Take 237 mLs by mouth 2 (two) times daily between meals., Disp: 60 Bottle, Rfl: 2;  hydrochlorothiazide (HYDRODIURIL) 25 MG tablet, Take 1 tablet (25 mg total) by mouth daily., Disp: 90 tablet, Rfl: 3;  isosorbide mononitrate (IMDUR) 60 MG 24 hr tablet, Take 1 tablet (60 mg total) by mouth daily., Disp: 90 tablet, Rfl: 3 loratadine (CLARITIN) 10 MG tablet, Take 10 mg by mouth daily.  , Disp: , Rfl: ;   metoprolol succinate (TOPROL-XL) 50 MG 24 hr tablet, Take 50 mg by mouth daily. Take with or immediately following a meal., Disp: , Rfl: ;  Multiple Vitamin (MULTIVITAMIN) tablet, Take 1 tablet by mouth daily.  , Disp: , Rfl: ;  nitroGLYCERIN (NITROSTAT) 0.4 MG SL tablet, Place 0.4 mg under the tongue every 5 (five) minutes as needed.  , Disp: , Rfl:  predniSONE (STERAPRED UNI-PAK) 10 MG tablet, Take 1 tablet (10 mg total) by mouth daily. Please take 30 mg (3 pills) on 09/15/12 and 20 mg (2 pills) on 09/16/12, Disp: 5 tablet, Rfl: 0;  simvastatin (ZOCOR) 40 MG tablet, Take 1 tablet (40 mg total) by mouth at bedtime., Disp: 90 tablet, Rfl: 3;  zoster vaccine live, PF, (ZOSTAVAX) 14782 UNT/0.65ML injection, Inject into the skin once. Dispense to patient to be admin in doctors office , Disp: , Rfl:   Allergies: No Known Allergies  Past Medical History, Surgical history, Social history, and Family History were reviewed and updated.  Review of Systems: Constitutional:  Negative for fever, chills, night sweats, anorexia, weight loss, pain. Cardiovascular: no chest pain or dyspnea on exertion Respiratory: negative Neurological: negative Dermatological: negative ENT: negative Skin: Negative. Gastrointestinal: negative Genito-Urinary: negative Hematological and Lymphatic:  Breast: negative Musculoskeletal: negative Remaining ROS negative. Physical Exam: Blood pressure 149/67, pulse 69, temperature 98.8 F (37.1 C), temperature source Oral, resp. rate 20, height 5\' 10"  (1.778 m), weight 206 lb 4.8 oz (93.577 kg). ECOG:  1 General appearance: alert Head: Normocephalic, without obvious abnormality, atraumatic Neck: no adenopathy, no carotid bruit, no JVD, supple, symmetrical, trachea midline and thyroid not enlarged, symmetric, no tenderness/mass/nodules Lymph nodes: Cervical, supraclavicular, and axillary nodes normal. Heart:regular rate and rhythm, S1, S2 normal, no murmur, click, rub or  gallop Lung:chest clear, no wheezing, rales, normal symmetric air entry Abdomin: soft, non-tender, without masses or organomegaly EXT:no erythema, induration, or nodules   Lab Results: Lab Results  Component Value Date   WBC 8.9 09/13/2012   HGB 10.4* 09/13/2012   HCT 30.4* 09/13/2012   MCV 84.2 09/13/2012   PLT 272 09/13/2012     Chemistry      Component Value Date/Time   NA 135 09/13/2012 0510   K 3.6 09/13/2012 0510   CL 100 09/13/2012 0510   CO2 28 09/13/2012 0510   BUN 16 09/13/2012 0510   CREATININE 0.70 09/13/2012 0510   CREATININE 0.76 06/27/2012 0856      Component Value Date/Time   CALCIUM 8.8 09/13/2012 0510   ALKPHOS 173* 09/04/2012 2215   AST 39* 09/04/2012 2215   ALT 28 09/04/2012 2215   BILITOT 0.4 09/04/2012 2215    Results for Todd [name], Todd [name] (MRN [id number]) as of 09/20/2012 10:02  Ref. Range 09/06/2012 15:22  CEA Latest Range: 0.0-5.0 ng/mL 59.3 (H)    Impression and Plan:  79 year old with :  1. Stage IV colon cancer. He is S/P sigmoid colon resection with a bulky tumor in the liver. Risks and benefits of palliative chemotherapy were discussed. I am inclined to use reduced dose FOLFIRI with avastin. Complications include GI, bleeding, infections, FTT and possible need for growth factors.  The benefits is palliative at this point. Chemotherapy can palliate future symptoms of advanced cancer and can increase his like expectancy close to double digit months.  He will discuss this with his family and get back to me.  I anticipate starting this close to 6 weeks from his operation.   2. Port management. Port in place, no flush will be needed.    [facility], MD 2/26/20149:58 AM

## 2012-09-20 NOTE — Progress Notes (Signed)
Checked in patient for follow up with Dr.Shadad.

## 2012-09-22 ENCOUNTER — Encounter (INDEPENDENT_AMBULATORY_CARE_PROVIDER_SITE_OTHER): Payer: Self-pay | Admitting: Surgery

## 2012-09-22 ENCOUNTER — Telehealth: Payer: Self-pay | Admitting: Family Medicine

## 2012-09-22 ENCOUNTER — Ambulatory Visit (INDEPENDENT_AMBULATORY_CARE_PROVIDER_SITE_OTHER): Payer: Medicare Other | Admitting: Surgery

## 2012-09-22 VITALS — BP 134/76 | HR 60 | Temp 98.0°F | Resp 18 | Ht 69.5 in | Wt 209.0 lb

## 2012-09-22 DIAGNOSIS — C787 Secondary malignant neoplasm of liver and intrahepatic bile duct: Secondary | ICD-10-CM

## 2012-09-22 DIAGNOSIS — C189 Malignant neoplasm of colon, unspecified: Secondary | ICD-10-CM

## 2012-09-22 NOTE — Telephone Encounter (Signed)
[phone] Called patient to inquire about his welfare, discuss his thoughts since his recent oncology visit on 09/20/12.  Left voice mail message.  I will have his living will document scanned into the EMR and flagged on record for future episodes of care. Paula Compton, MD

## 2012-09-22 NOTE — Progress Notes (Signed)
[facility]  Ovidio Kin, MD,  FACS [address]  [address] [place], [place]    [address] Phone:  [phone] FAX:  [phone]   Re:   Garrie Woodin DOB:   01-Dec-1929 MRN:   [id number]  ASSESSMENT AND PLAN: 1. LAPAROSCOPIC ASSISTED SIGMOID COLON RESECTION, END Left OSTOMY, Hartmann's pouch, Placement of power port - D. Champ Keetch - 09/07/2012   For obstructing sigmoid colon cancer.   T4b, N2a, M1.  To see me back in 4 months.  If doing well, that could be his last visit with me.  2.  Metastatic disease to liver.   Seen by Dr. Earl Gala   He is planning reduced dose FOLFIRI with avastin.  3.  BPH - seen by Dr. Laverle Patter in the hospital for traumatic hematuria. 4.  Diabetes mellitus  5.  Cardiomyopathy with CAD   Distant h/o anterior MI with 100% occluded LAD & associated Ischemic Cardiomyopathy EF ~35% with chronic mild dependent edema - followed by Dr. Ranae Palms  6.  History of gout left ankle.    HISTORY OF PRESENT ILLNESS: Chief Complaint  Patient presents with  . Routine Post Op    colon resc    Aadam Zhen is a 79 y.o. (DOB: [birth year])  white  male who is a patient of [name],JAMES O, MD and comes to me today for follow up of a diverting colostomy for an obstructing colon cancer.  He is doing well.  Getting his strength back.  His appetite is also improving.  His colostomy is working well.  He has talked to Dr. Willey Blade and he is planning of going ahead with chemotx for now.  Past Medical History  Diagnosis Date  . Myocardial infarction 1990    "Massive"  . Gout   . Hypertension   . Arthritis, rheumatoid   . Diabetes mellitus without complication   . CHF (congestive heart failure)   . BPH (benign prostatic hyperplasia)   . Neuropathy   . Hyperlipidemia    Current Outpatient Prescriptions  Medication Sig Dispense Refill  . aspirin 81 MG tablet Take 81 mg by mouth daily.        . benazepril (LOTENSIN) 10 MG tablet Take 1 tablet (10 mg  total) by mouth daily.  90 tablet  3  . colchicine 0.6 MG tablet Take 0.6 mg by mouth daily as needed (for gout flares).      . diclofenac (VOLTAREN) 75 MG EC tablet Take 75 mg by mouth 2 (two) times daily.      Marland Kitchen doxazosin (CARDURA) 8 MG tablet Take 8 mg by mouth daily.       . feeding supplement (ENSURE COMPLETE) LIQD Take 237 mLs by mouth 2 (two) times daily between meals.  60 Bottle  2  . hydrochlorothiazide (HYDRODIURIL) 25 MG tablet Take 1 tablet (25 mg total) by mouth daily.  90 tablet  3  . isosorbide mononitrate (IMDUR) 60 MG 24 hr tablet Take 1 tablet (60 mg total) by mouth daily.  90 tablet  3  . loratadine (CLARITIN) 10 MG tablet Take 10 mg by mouth daily.        . metoprolol succinate (TOPROL-XL) 50 MG 24 hr tablet Take 50 mg by mouth daily. Take with or immediately following a meal.      . Multiple Vitamin (MULTIVITAMIN) tablet Take 1 tablet by mouth daily.        . nitroGLYCERIN (NITROSTAT) 0.4 MG SL tablet Place 0.4 mg under the  tongue every 5 (five) minutes as needed.        . predniSONE (STERAPRED UNI-PAK) 10 MG tablet Take 1 tablet (10 mg total) by mouth daily. Please take 30 mg (3 pills) on 09/15/12 and 20 mg (2 pills) on 09/16/12  5 tablet  0  . simvastatin (ZOCOR) 40 MG tablet Take 1 tablet (40 mg total) by mouth at bedtime.  90 tablet  3  . zoster vaccine live, PF, (ZOSTAVAX) [address] UNT/0.65ML injection Inject into the skin once. Dispense to patient to be admin in doctors office       . gabapentin (NEURONTIN) 100 MG capsule        No current facility-administered medications for this visit.     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 Wife with patient.  PHYSICAL EXAM: BP 134/76  Pulse 60  Temp(Src) 98 F (36.7 C)  Resp 18  Ht 5' 9.5" (1.765 m)  Wt 209 lb (94.802 kg)  BMI 30.43 kg/m2  General: WN older WM who is alert .  He actually looks pretty good considering his recent surgery and diagnosis. HEENT: Normal. Pupils equal.   Lungs: Clear to auscultation and symmetric breath sounds. Heart:  RRR. No murmur or rub. Abdomen: Soft.  No tenderness. No hernia. Normal bowel sounds.  Left upper quadrant ostomy.  Midline wound and other smaller wounds look good.  DATA REVIEWED: Pathology report and Epic records.  Ovidio Kin, MD, FACS Office:  [phone]

## 2012-09-22 NOTE — Telephone Encounter (Signed)
Patient called back, is inclined to attempt palliative chemotherapy after meeting with Dr Clelia Croft this past Wednesday.  Will set up an appointment to see him in our office in about a month (soon after the start of his chemotx).  He will contact me with any other concerns. He did receive the travel reimbursement form for the travel agency with which he had scheduled his cruise. Paula Compton, MD

## 2012-09-25 ENCOUNTER — Telehealth: Payer: Self-pay | Admitting: *Deleted

## 2012-09-25 NOTE — Telephone Encounter (Signed)
Patient called and left message regarding the need for an appt. I have forwarded the message to the desk RN.  JMW

## 2012-09-25 NOTE — Telephone Encounter (Signed)
Pt called states " I'm calling to set up an appt as a presurser to my first chemo treatment." Informed pt MD out of office, message will be given to MD upon his return on 09/26/12. Pt to expect call from scheduling regarding future appt(s). Pt verbalized understanding. No further concerns

## 2012-09-26 ENCOUNTER — Other Ambulatory Visit: Payer: Self-pay | Admitting: Oncology

## 2012-09-26 DIAGNOSIS — C787 Secondary malignant neoplasm of liver and intrahepatic bile duct: Secondary | ICD-10-CM

## 2012-09-29 ENCOUNTER — Ambulatory Visit: Payer: Medicare Other | Admitting: Family Medicine

## 2012-10-08 ENCOUNTER — Telehealth: Payer: Self-pay | Admitting: Oncology

## 2012-10-08 NOTE — Telephone Encounter (Signed)
S/w pt and wife re next appt for 3/19 and pt to get schedule when he comes in. Shameeka to f/u w/FS re f/u for 4/23 (no avail).

## 2012-10-11 ENCOUNTER — Encounter: Payer: Self-pay | Admitting: *Deleted

## 2012-10-11 ENCOUNTER — Other Ambulatory Visit: Payer: Medicare Other

## 2012-10-11 ENCOUNTER — Other Ambulatory Visit: Payer: Self-pay | Admitting: *Deleted

## 2012-10-11 MED ORDER — LIDOCAINE-PRILOCAINE 2.5-2.5 % EX CREA: TOPICAL_CREAM | CUTANEOUS | Status: DC | PRN

## 2012-10-11 MED ORDER — PROCHLORPERAZINE MALEATE 10 MG PO TABS: 10.0000 mg | ORAL_TABLET | Freq: Four times a day (QID) | ORAL | Status: DC | PRN

## 2012-10-11 NOTE — Telephone Encounter (Signed)
E-scribed emla cream and compazine to [facility] @ [address]. Per patient's request. Lm on patient identified voice mail.

## 2012-10-12 ENCOUNTER — Other Ambulatory Visit: Payer: Self-pay | Admitting: Family Medicine

## 2012-10-16 ENCOUNTER — Other Ambulatory Visit (HOSPITAL_COMMUNITY): Payer: Self-pay | Admitting: Cardiovascular Disease

## 2012-10-16 DIAGNOSIS — I25810 Atherosclerosis of coronary artery bypass graft(s) without angina pectoris: Secondary | ICD-10-CM

## 2012-10-16 DIAGNOSIS — I519 Heart disease, unspecified: Secondary | ICD-10-CM

## 2012-10-17 ENCOUNTER — Other Ambulatory Visit (HOSPITAL_BASED_OUTPATIENT_CLINIC_OR_DEPARTMENT_OTHER): Payer: Medicare Other | Admitting: Lab

## 2012-10-17 ENCOUNTER — Ambulatory Visit (HOSPITAL_BASED_OUTPATIENT_CLINIC_OR_DEPARTMENT_OTHER): Payer: Medicare Other | Admitting: Oncology

## 2012-10-17 ENCOUNTER — Ambulatory Visit (HOSPITAL_COMMUNITY)
Admission: RE | Admit: 2012-10-17 | Discharge: 2012-10-17 | Disposition: A | Discharge status: Home / Self Care | Payer: Medicare Other | Source: Ambulatory Visit | Attending: Cardiovascular Disease | Admitting: Cardiovascular Disease

## 2012-10-17 VITALS — BP 163/73 | HR 58 | Temp 97.5°F | Resp 20 | Ht 69.5 in | Wt 215.9 lb

## 2012-10-17 DIAGNOSIS — I10 Essential (primary) hypertension: Secondary | ICD-10-CM | POA: Insufficient documentation

## 2012-10-17 DIAGNOSIS — E785 Hyperlipidemia, unspecified: Secondary | ICD-10-CM | POA: Insufficient documentation

## 2012-10-17 DIAGNOSIS — C779 Secondary and unspecified malignant neoplasm of lymph node, unspecified: Secondary | ICD-10-CM

## 2012-10-17 DIAGNOSIS — I25810 Atherosclerosis of coronary artery bypass graft(s) without angina pectoris: Secondary | ICD-10-CM

## 2012-10-17 DIAGNOSIS — E669 Obesity, unspecified: Secondary | ICD-10-CM | POA: Insufficient documentation

## 2012-10-17 DIAGNOSIS — I2510 Atherosclerotic heart disease of native coronary artery without angina pectoris: Secondary | ICD-10-CM

## 2012-10-17 DIAGNOSIS — C787 Secondary malignant neoplasm of liver and intrahepatic bile duct: Secondary | ICD-10-CM

## 2012-10-17 DIAGNOSIS — I319 Disease of pericardium, unspecified: Secondary | ICD-10-CM | POA: Insufficient documentation

## 2012-10-17 DIAGNOSIS — I379 Nonrheumatic pulmonary valve disorder, unspecified: Secondary | ICD-10-CM | POA: Insufficient documentation

## 2012-10-17 DIAGNOSIS — C187 Malignant neoplasm of sigmoid colon: Secondary | ICD-10-CM

## 2012-10-17 DIAGNOSIS — I059 Rheumatic mitral valve disease, unspecified: Secondary | ICD-10-CM | POA: Insufficient documentation

## 2012-10-17 DIAGNOSIS — I519 Heart disease, unspecified: Secondary | ICD-10-CM

## 2012-10-17 DIAGNOSIS — I509 Heart failure, unspecified: Secondary | ICD-10-CM | POA: Insufficient documentation

## 2012-10-17 DIAGNOSIS — I5030 Unspecified diastolic (congestive) heart failure: Secondary | ICD-10-CM | POA: Insufficient documentation

## 2012-10-17 DIAGNOSIS — I079 Rheumatic tricuspid valve disease, unspecified: Secondary | ICD-10-CM | POA: Insufficient documentation

## 2012-10-17 DIAGNOSIS — I252 Old myocardial infarction: Secondary | ICD-10-CM | POA: Insufficient documentation

## 2012-10-17 DIAGNOSIS — C189 Malignant neoplasm of colon, unspecified: Secondary | ICD-10-CM

## 2012-10-17 DIAGNOSIS — E119 Type 2 diabetes mellitus without complications: Secondary | ICD-10-CM | POA: Insufficient documentation

## 2012-10-17 DIAGNOSIS — I359 Nonrheumatic aortic valve disorder, unspecified: Secondary | ICD-10-CM | POA: Insufficient documentation

## 2012-10-17 LAB — COMPREHENSIVE METABOLIC PANEL (CC13)
Alkaline Phosphatase: 77 U/L (ref 40–150)
BUN: 17.1 mg/dL (ref 7.0–26.0)
CO2: 27 mEq/L (ref 22–29)
Creatinine: 0.9 mg/dL (ref 0.7–1.3)
Glucose: 131 mg/dl — ABNORMAL HIGH (ref 70–99)
Sodium: 140 mEq/L (ref 136–145)
Total Bilirubin: 0.54 mg/dL (ref 0.20–1.20)

## 2012-10-17 LAB — CBC WITH DIFFERENTIAL/PLATELET
Eosinophils Absolute: 0.2 10*3/uL (ref 0.0–0.5)
HCT: 36.3 % — ABNORMAL LOW (ref 38.4–49.9)
LYMPH%: 22.5 % (ref 14.0–49.0)
MCV: 86.7 fL (ref 79.3–98.0)
MONO%: 8 % (ref 0.0–14.0)
NEUT#: 5.1 10*3/uL (ref 1.5–6.5)
NEUT%: 66.4 % (ref 39.0–75.0)
Platelets: 194 10*3/uL (ref 140–400)
RBC: 4.18 10*6/uL — ABNORMAL LOW (ref 4.20–5.82)

## 2012-10-17 LAB — UA PROTEIN, DIPSTICK - CHCC: Protein, ur: NEGATIVE mg/dL

## 2012-10-17 NOTE — Progress Notes (Signed)
Hematology and Oncology Follow Up Visit  Todd [name] [id number] 10/13/1929 79 y.o. 10/17/2012 1:39 PM Todd [name], MDBreen, Todd Lamas, MD   Principle Diagnosis: 78 year old with stage IV colon cancer diagnosed in 08/2012. He presented with a colonic mass and liver lesions.  KRAS status is pending (it has been requested).   Prior Therapy: He is S/P LAPAROSCOPIC ASSISTED SIGMOID COLON RESECTION, END Left OSTOMY, Hartmann's pouch and acement of power port done on 09/07/2012.   Current therapy:  He is to start FOLFIRI and Avastin on 3/26.   Interim History: Todd [name] presents today for a follow up visit. He is a very nice man with stage IV colon cancer. He under went surgical resection on 09/07/2012 and has been doing well. The pathology reviewed and showed T4bN2M1 disease (5/20 lymph nodes noted). Clinically, he is recovering very well and his wounds fully healed at this time. His mobility is much improved so is his apatite.   No abdominal pain or discomfort.    Medications: I have reviewed the patient's current medications. Current outpatient prescriptions:aspirin 81 MG tablet, Take 81 mg by mouth daily.  , Disp: , Rfl: ;  benazepril (LOTENSIN) 10 MG tablet, Take 1 tablet (10 mg total) by mouth daily., Disp: 90 tablet, Rfl: 3;  chlorhexidine (PERIDEX) 0.12 % solution, Use as directed 15 mLs in the mouth or throat 2 (two) times daily. Use as directed for mouth irritation, Disp: , Rfl:  colchicine 0.6 MG tablet, Take 0.6 mg by mouth daily as needed (for gout flares)., Disp: , Rfl: ;  diclofenac (VOLTAREN) 75 MG EC tablet, Take 1 tablet (75 mg total) by mouth 2 (two) times daily as needed., Disp: 180 tablet, Rfl: 0;  doxazosin (CARDURA) 8 MG tablet, Take 8 mg by mouth daily. , Disp: , Rfl: ;  feeding supplement (ENSURE COMPLETE) LIQD, Take 237 mLs by mouth 2 (two) times daily between meals., Disp: 60 Bottle, Rfl: 2 gabapentin (NEURONTIN) 100 MG capsule, , Disp: , Rfl: ;  hydrochlorothiazide  (HYDRODIURIL) 25 MG tablet, Take 1 tablet (25 mg total) by mouth daily., Disp: 90 tablet, Rfl: 3;  isosorbide mononitrate (IMDUR) 60 MG 24 hr tablet, Take 1 tablet (60 mg total) by mouth daily., Disp: 90 tablet, Rfl: 3 lidocaine-prilocaine (EMLA) cream, Apply topically as needed. Prior to chemotherapy treatment, Apply approx 1/2 teaspoon to skin over port, do not rub in. Cover with plastic,, Disp: 30 g, Rfl: 1;  loratadine (CLARITIN) 10 MG tablet, Take 10 mg by mouth daily.  , Disp: , Rfl: ;  metoprolol succinate (TOPROL-XL) 50 MG 24 hr tablet, Take 50 mg by mouth daily. Take with or immediately following a meal., Disp: , Rfl:  Multiple Vitamin (MULTIVITAMIN) tablet, Take 1 tablet by mouth daily.  , Disp: , Rfl: ;  nitroGLYCERIN (NITROSTAT) 0.4 MG SL tablet, Place 0.4 mg under the tongue every 5 (five) minutes as needed.  , Disp: , Rfl: ;  Polyethyl Glycol-Propyl Glycol (SYSTANE OP), Apply 1 drop to eye daily. 1 or 2 drops in the affected eye as needed, Disp: , Rfl:  prochlorperazine (COMPAZINE) 10 MG tablet, Take 1 tablet (10 mg total) by mouth every 6 (six) hours as needed., Disp: 30 tablet, Rfl: 1;  simvastatin (ZOCOR) 40 MG tablet, Take 1 tablet (40 mg total) by mouth at bedtime., Disp: 90 tablet, Rfl: 3;  zoster vaccine live, PF, (ZOSTAVAX) 08657 UNT/0.65ML injection, Inject into the skin once. Dispense to patient to be admin in doctors office , Disp: ,  Rfl:   Allergies: No Known Allergies  Past Medical History, Surgical history, Social history, and Family History were reviewed and updated.  Review of Systems: Constitutional:  Negative for fever, chills, night sweats, anorexia, weight loss, pain. Cardiovascular: no chest pain or dyspnea on exertion Respiratory: negative Neurological: negative Dermatological: negative ENT: negative Skin: Negative. Gastrointestinal: negative Genito-Urinary: negative Hematological and Lymphatic:  Breast: negative Musculoskeletal: negative Remaining ROS  negative. Physical Exam: Blood pressure 163/73, pulse 58, temperature 97.5 F (36.4 C), temperature source Oral, resp. rate 20, height 5' 9.5" (1.765 m), weight 215 lb 14.4 oz (97.932 kg). ECOG: 1 General appearance: alert Head: Normocephalic, without obvious abnormality, atraumatic Neck: no adenopathy, no carotid bruit, no JVD, supple, symmetrical, trachea midline and thyroid not enlarged, symmetric, no tenderness/mass/nodules Lymph nodes: Cervical, supraclavicular, and axillary nodes normal. Heart:regular rate and rhythm, S1, S2 normal, no murmur, click, rub or gallop Lung:chest clear, no wheezing, rales, normal symmetric air entry Abdomin: soft, non-tender, without masses or organomegaly EXT:no erythema, induration, or nodules   Lab Results: Lab Results  Component Value Date   WBC 7.7 10/17/2012   HGB 12.6* 10/17/2012   HCT 36.3* 10/17/2012   MCV 86.7 10/17/2012   PLT 194 10/17/2012     Chemistry      Component Value Date/Time   NA 135 09/13/2012 0510   K 3.6 09/13/2012 0510   CL 100 09/13/2012 0510   CO2 28 09/13/2012 0510   BUN 16 09/13/2012 0510   CREATININE 0.70 09/13/2012 0510   CREATININE 0.76 06/27/2012 0856      Component Value Date/Time   CALCIUM 8.8 09/13/2012 0510   ALKPHOS 173* 09/04/2012 2215   AST 39* 09/04/2012 2215   ALT 28 09/04/2012 2215   BILITOT 0.4 09/04/2012 2215    Results for Todd [name] (MRN [id number]) as of 09/20/2012 10:02  Ref. Range 09/06/2012 15:22  CEA Latest Range: 0.0-5.0 ng/mL 59.3 (H)    Impression and Plan:  79 year old man with :  1. Stage IV colon cancer. He is S/P sigmoid colon resection with a bulky tumor in the liver. Risks and benefits of palliative chemotherapy were discussed again. Complications include GI, bleeding, infections, FTT and possible need for growth factors.  The benefits is palliative at this point and he understands that.  He will receive his first cycle on 3/26.  2. Port management. Port in place, no flush will be  needed.   3. Follow up: in 2 weeks for cycle 2.    Todd Hose, MD 3/25/20141:39 PM

## 2012-10-17 NOTE — Progress Notes (Signed)
[facility]   2D echo completed 10/17/2012.   Cindy Nicolette Gieske, RDCS  

## 2012-10-18 ENCOUNTER — Encounter: Payer: Self-pay | Admitting: *Deleted

## 2012-10-18 ENCOUNTER — Ambulatory Visit (HOSPITAL_BASED_OUTPATIENT_CLINIC_OR_DEPARTMENT_OTHER): Payer: Medicare Other

## 2012-10-18 VITALS — BP 147/77 | HR 74 | Temp 96.9°F

## 2012-10-18 DIAGNOSIS — C187 Malignant neoplasm of sigmoid colon: Secondary | ICD-10-CM

## 2012-10-18 DIAGNOSIS — C189 Malignant neoplasm of colon, unspecified: Secondary | ICD-10-CM

## 2012-10-18 DIAGNOSIS — Z5112 Encounter for antineoplastic immunotherapy: Secondary | ICD-10-CM

## 2012-10-18 DIAGNOSIS — Z5111 Encounter for antineoplastic chemotherapy: Secondary | ICD-10-CM

## 2012-10-18 LAB — CEA: CEA: 17 ng/mL — ABNORMAL HIGH (ref 0.0–5.0)

## 2012-10-18 MED ORDER — SODIUM CHLORIDE 0.9 % IV SOLN
Freq: Once | INTRAVENOUS | Status: AC
  Administered 2012-10-18: 09:00:00 via INTRAVENOUS

## 2012-10-18 MED ORDER — LEUCOVORIN CALCIUM INJECTION 350 MG
301.0000 mg/m2 | Freq: Once | INTRAVENOUS | Status: AC
  Administered 2012-10-18: 650 mg via INTRAVENOUS
  Filled 2012-10-18: qty 32.5

## 2012-10-18 MED ORDER — FLUOROURACIL CHEMO INJECTION 2.5 GM/50ML
300.0000 mg/m2 | Freq: Once | INTRAVENOUS | Status: AC
  Administered 2012-10-18: 650 mg via INTRAVENOUS
  Filled 2012-10-18: qty 13

## 2012-10-18 MED ORDER — DEXAMETHASONE SODIUM PHOSPHATE 4 MG/ML IJ SOLN
20.0000 mg | Freq: Once | INTRAMUSCULAR | Status: AC
  Administered 2012-10-18: 20 mg via INTRAVENOUS

## 2012-10-18 MED ORDER — ATROPINE SULFATE 1 MG/ML IJ SOLN: 0.5000 mg | Freq: Once | INTRAMUSCULAR | Status: DC | PRN

## 2012-10-18 MED ORDER — FLUOROURACIL CHEMO INJECTION 5 GM/100ML
1800.0000 mg/m2 | INTRAVENOUS | Status: DC
  Administered 2012-10-18: 3900 mg via INTRAVENOUS
  Filled 2012-10-18: qty 78

## 2012-10-18 MED ORDER — ONDANSETRON 16 MG/50ML IVPB (CHCC)
16.0000 mg | Freq: Once | INTRAVENOUS | Status: AC
  Administered 2012-10-18: 16 mg via INTRAVENOUS

## 2012-10-18 MED ORDER — SODIUM CHLORIDE 0.9 % IV SOLN
5.0000 mg/kg | Freq: Once | INTRAVENOUS | Status: AC
  Administered 2012-10-18: 475 mg via INTRAVENOUS
  Filled 2012-10-18: qty 19

## 2012-10-18 MED ORDER — IRINOTECAN HCL CHEMO INJECTION 100 MG/5ML
125.0000 mg/m2 | Freq: Once | INTRAVENOUS | Status: AC
  Administered 2012-10-18: 270 mg via INTRAVENOUS
  Filled 2012-10-18: qty 13.5

## 2012-10-18 NOTE — Patient Instructions (Signed)
[facility] Discharge Instructions for Patients Receiving Chemotherapy  Today you received the following chemotherapy agents        Avastin/Camptosar/Leukovorin/5FU  To help prevent nausea and vomiting after your treatment, we encourage you to take your nausea medication as directed.   If you develop nausea and vomiting that is not controlled by your nausea medication, call the clinic. If it is after clinic hours your family physician or the after hours number for the clinic or go to the Emergency Department.   BELOW ARE SYMPTOMS THAT SHOULD BE REPORTED IMMEDIATELY:  *FEVER GREATER THAN 100.5 F  *CHILLS WITH OR WITHOUT FEVER  NAUSEA AND VOMITING THAT IS NOT CONTROLLED WITH YOUR NAUSEA MEDICATION  *UNUSUAL SHORTNESS OF BREATH  *UNUSUAL BRUISING OR BLEEDING  TENDERNESS IN MOUTH AND THROAT WITH OR WITHOUT PRESENCE OF ULCERS  *URINARY PROBLEMS  *BOWEL PROBLEMS  UNUSUAL RASH Items with * indicate a potential emergency and should be followed up as soon as possible.  One of the nurses will contact you 24 hours after your treatment. Please let the nurse know about any problems that you may have experienced. Feel free to call the clinic you have any questions or concerns. The clinic phone number is [phone].   I have been informed and understand all the instructions given to me. I know to contact the clinic, my physician, or go to the Emergency Department if any problems should occur. I do not have any questions at this time, but understand that I may call the clinic during office hours   should I have any questions or need assistance in obtaining follow up care.    __________________________________________  _____________  __________ Signature of Patient or Authorized Representative            Date                   Time    __________________________________________ Nurse's Signature

## 2012-10-19 ENCOUNTER — Other Ambulatory Visit: Payer: Self-pay | Admitting: Certified Registered Nurse Anesthetist

## 2012-10-20 ENCOUNTER — Ambulatory Visit (HOSPITAL_BASED_OUTPATIENT_CLINIC_OR_DEPARTMENT_OTHER): Payer: Medicare Other

## 2012-10-20 VITALS — BP 137/51 | HR 59 | Temp 97.5°F

## 2012-10-20 DIAGNOSIS — C187 Malignant neoplasm of sigmoid colon: Secondary | ICD-10-CM

## 2012-10-20 DIAGNOSIS — C189 Malignant neoplasm of colon, unspecified: Secondary | ICD-10-CM

## 2012-10-20 MED ORDER — SODIUM CHLORIDE 0.9 % IJ SOLN
10.0000 mL | INTRAMUSCULAR | Status: DC | PRN
  Administered 2012-10-20: 10 mL
  Filled 2012-10-20: qty 10

## 2012-10-20 MED ORDER — HEPARIN SOD (PORK) LOCK FLUSH 100 UNIT/ML IV SOLN
500.0000 [IU] | Freq: Once | INTRAVENOUS | Status: AC | PRN
  Administered 2012-10-20: 500 [IU]
  Filled 2012-10-20: qty 5

## 2012-10-20 NOTE — Progress Notes (Signed)
Patient here today for pump dc.  Patient tolerated 75fu infusion without complaints.  He is eating well and getting his fluids in without difficulty.

## 2012-10-20 NOTE — Patient Instructions (Signed)
Call MD for problems or concerns 

## 2012-10-23 ENCOUNTER — Telehealth: Payer: Self-pay | Admitting: *Deleted

## 2012-10-23 NOTE — Telephone Encounter (Signed)
Patient calling to say he had diarrhea last Friday ( 10/20/12 ) took imodium and has not had a BM since Saturday ( 10/21/12/)  Instructed him to take a stool softener, that he has in the home, and to get a bottle of magnesium citrate. Drink half, wait 2 hours, if no results, drink the other half. Patient verbalized understanding. Note to dr Alver Fisher desk

## 2012-11-01 ENCOUNTER — Other Ambulatory Visit (HOSPITAL_BASED_OUTPATIENT_CLINIC_OR_DEPARTMENT_OTHER): Payer: Medicare Other | Admitting: Lab

## 2012-11-01 ENCOUNTER — Ambulatory Visit (HOSPITAL_BASED_OUTPATIENT_CLINIC_OR_DEPARTMENT_OTHER): Payer: Medicare Other | Admitting: Oncology

## 2012-11-01 ENCOUNTER — Ambulatory Visit (HOSPITAL_BASED_OUTPATIENT_CLINIC_OR_DEPARTMENT_OTHER): Payer: Medicare Other

## 2012-11-01 ENCOUNTER — Encounter: Payer: Self-pay | Admitting: Oncology

## 2012-11-01 VITALS — BP 130/58 | HR 53 | Temp 96.9°F | Resp 18 | Ht 69.0 in | Wt 214.3 lb

## 2012-11-01 DIAGNOSIS — Z5111 Encounter for antineoplastic chemotherapy: Secondary | ICD-10-CM

## 2012-11-01 DIAGNOSIS — C187 Malignant neoplasm of sigmoid colon: Secondary | ICD-10-CM

## 2012-11-01 DIAGNOSIS — C787 Secondary malignant neoplasm of liver and intrahepatic bile duct: Secondary | ICD-10-CM

## 2012-11-01 DIAGNOSIS — Z5112 Encounter for antineoplastic immunotherapy: Secondary | ICD-10-CM

## 2012-11-01 DIAGNOSIS — C189 Malignant neoplasm of colon, unspecified: Secondary | ICD-10-CM

## 2012-11-01 LAB — COMPREHENSIVE METABOLIC PANEL (CC13)
ALT: 10 U/L (ref 0–55)
AST: 19 U/L (ref 5–34)
Alkaline Phosphatase: 58 U/L (ref 40–150)
CO2: 24 mEq/L (ref 22–29)
Sodium: 138 mEq/L (ref 136–145)
Total Bilirubin: 0.57 mg/dL (ref 0.20–1.20)
Total Protein: 6.4 g/dL (ref 6.4–8.3)

## 2012-11-01 LAB — CBC WITH DIFFERENTIAL/PLATELET
EOS%: 2.8 % (ref 0.0–7.0)
Eosinophils Absolute: 0.2 10*3/uL (ref 0.0–0.5)
MCV: 85.6 fL (ref 79.3–98.0)
MONO%: 9.7 % (ref 0.0–14.0)
NEUT#: 3.7 10*3/uL (ref 1.5–6.5)
RBC: 3.97 10*6/uL — ABNORMAL LOW (ref 4.20–5.82)
RDW: 15.3 % — ABNORMAL HIGH (ref 11.0–14.6)
nRBC: 0 % (ref 0–0)

## 2012-11-01 MED ORDER — SODIUM CHLORIDE 0.9 % IJ SOLN
10.0000 mL | INTRAMUSCULAR | Status: DC | PRN
  Filled 2012-11-01: qty 10

## 2012-11-01 MED ORDER — LEUCOVORIN CALCIUM INJECTION 350 MG
301.0000 mg/m2 | Freq: Once | INTRAVENOUS | Status: AC
  Administered 2012-11-01: 650 mg via INTRAVENOUS
  Filled 2012-11-01: qty 32.5

## 2012-11-01 MED ORDER — SODIUM CHLORIDE 0.9 % IV SOLN
1800.0000 mg/m2 | INTRAVENOUS | Status: DC
  Administered 2012-11-01: 3900 mg via INTRAVENOUS
  Filled 2012-11-01: qty 78

## 2012-11-01 MED ORDER — ATROPINE SULFATE 1 MG/ML IJ SOLN: 0.5000 mg | Freq: Once | INTRAMUSCULAR | Status: DC | PRN

## 2012-11-01 MED ORDER — SODIUM CHLORIDE 0.9 % IV SOLN
5.0000 mg/kg | Freq: Once | INTRAVENOUS | Status: AC
  Administered 2012-11-01: 475 mg via INTRAVENOUS
  Filled 2012-11-01: qty 19

## 2012-11-01 MED ORDER — HEPARIN SOD (PORK) LOCK FLUSH 100 UNIT/ML IV SOLN
500.0000 [IU] | Freq: Once | INTRAVENOUS | Status: DC | PRN
  Filled 2012-11-01: qty 5

## 2012-11-01 MED ORDER — IRINOTECAN HCL CHEMO INJECTION 100 MG/5ML
125.0000 mg/m2 | Freq: Once | INTRAVENOUS | Status: AC
  Administered 2012-11-01: 270 mg via INTRAVENOUS
  Filled 2012-11-01: qty 13.5

## 2012-11-01 MED ORDER — FLUOROURACIL CHEMO INJECTION 2.5 GM/50ML
300.0000 mg/m2 | Freq: Once | INTRAVENOUS | Status: AC
  Administered 2012-11-01: 650 mg via INTRAVENOUS
  Filled 2012-11-01: qty 13

## 2012-11-01 MED ORDER — ONDANSETRON 16 MG/50ML IVPB (CHCC)
16.0000 mg | Freq: Once | INTRAVENOUS | Status: AC
  Administered 2012-11-01: 16 mg via INTRAVENOUS

## 2012-11-01 MED ORDER — SODIUM CHLORIDE 0.9 % IV SOLN: Freq: Once | INTRAVENOUS | Status: DC

## 2012-11-01 MED ORDER — DEXAMETHASONE SODIUM PHOSPHATE 4 MG/ML IJ SOLN
20.0000 mg | Freq: Once | INTRAMUSCULAR | Status: AC
  Administered 2012-11-01: 20 mg via INTRAVENOUS

## 2012-11-01 NOTE — Progress Notes (Signed)
Hematology and Oncology Follow Up Visit  Todd [name] [id number] [birth year] 79 y.o. 11/01/2012 10:32 AM Todd [name], MDBreen, Todd Lamas, MD   Principle Diagnosis: 79 year old with stage IV colon cancer diagnosed in 08/2012. He presented with a colonic mass and liver lesions.  KRAS status is pending (it has been requested).   Prior Therapy: He is S/P LAPAROSCOPIC ASSISTED SIGMOID COLON RESECTION, END Left OSTOMY, Hartmann's pouch and acement of power port done on 09/07/2012.   Current therapy:  He started FOLFIRI and Avastin on 3/26. Here for cycle 2 today.  Interim History: [name] presents today for a follow up visit. He is a very nice man with stage IV colon cancer. He under went surgical resection on 09/07/2012 and has been doing well. The pathology reviewed and showed [id number] disease (5/20 lymph nodes noted). Received first cycle of chemo 2 weeks ago and tolerated well. Had mild diarrhea for about 3 days and took Imodium which then caused constipation. Using prunes to help with constipation. No chest pain, shortness of breath, abdominal pain, nausea, or vomiting. No bleeding. Appetite and weight stable.  Medications: I have reviewed the patient's current medications. Current outpatient prescriptions:aspirin 81 MG tablet, Take 81 mg by mouth daily.  , Disp: , Rfl: ;  benazepril (LOTENSIN) 10 MG tablet, Take 1 tablet (10 mg total) by mouth daily., Disp: 90 tablet, Rfl: 3;  chlorhexidine (PERIDEX) 0.12 % solution, Use as directed 15 mLs in the mouth or throat 2 (two) times daily. Use as directed for mouth irritation, Disp: , Rfl:  colchicine 0.6 MG tablet, Take 0.6 mg by mouth daily as needed (for gout flares)., Disp: , Rfl: ;  diclofenac (VOLTAREN) 75 MG EC tablet, Take 1 tablet (75 mg total) by mouth 2 (two) times daily as needed., Disp: 180 tablet, Rfl: 0;  doxazosin (CARDURA) 8 MG tablet, Take 8 mg by mouth daily. , Disp: , Rfl: ;  feeding supplement (ENSURE COMPLETE) LIQD, Take 237 mLs by mouth  2 (two) times daily between meals., Disp: 60 Bottle, Rfl: 2 gabapentin (NEURONTIN) 100 MG capsule, , Disp: , Rfl: ;  hydrochlorothiazide (HYDRODIURIL) 25 MG tablet, Take 1 tablet (25 mg total) by mouth daily., Disp: 90 tablet, Rfl: 3;  isosorbide mononitrate (IMDUR) 60 MG 24 hr tablet, Take 1 tablet (60 mg total) by mouth daily., Disp: 90 tablet, Rfl: 3 lidocaine-prilocaine (EMLA) cream, Apply topically as needed. Prior to chemotherapy treatment, Apply approx 1/2 teaspoon to skin over port, do not rub in. Cover with plastic,, Disp: 30 g, Rfl: 1;  loratadine (CLARITIN) 10 MG tablet, Take 10 mg by mouth daily.  , Disp: , Rfl: ;  metoprolol succinate (TOPROL-XL) 50 MG 24 hr tablet, Take 50 mg by mouth daily. Take with or immediately following a meal., Disp: , Rfl:  Multiple Vitamin (MULTIVITAMIN) tablet, Take 1 tablet by mouth daily.  , Disp: , Rfl: ;  nitroGLYCERIN (NITROSTAT) 0.4 MG SL tablet, Place 0.4 mg under the tongue every 5 (five) minutes as needed.  , Disp: , Rfl: ;  Polyethyl Glycol-Propyl Glycol (SYSTANE OP), Apply 1 drop to eye daily. 1 or 2 drops in the affected eye as needed, Disp: , Rfl:  prochlorperazine (COMPAZINE) 10 MG tablet, Take 1 tablet (10 mg total) by mouth every 6 (six) hours as needed., Disp: 30 tablet, Rfl: 1;  simvastatin (ZOCOR) 40 MG tablet, Take 1 tablet (40 mg total) by mouth at bedtime., Disp: 90 tablet, Rfl: 3;  zoster vaccine live, PF, (ZOSTAVAX) 40981  UNT/0.65ML injection, Inject into the skin once. Dispense to patient to be admin in doctors office , Disp: , Rfl:   Allergies: No Known Allergies  Past Medical History, Surgical history, Social history, and Family History were reviewed and updated.  Review of Systems: Constitutional:  Negative for fever, chills, night sweats, anorexia, weight loss, pain. Cardiovascular: no chest pain or dyspnea on exertion Respiratory: negative Neurological: negative Dermatological: negative ENT: negative Skin:  Negative. Gastrointestinal: negative Genito-Urinary: negative Hematological and Lymphatic:  Breast: negative Musculoskeletal: negative Remaining ROS negative.  Physical Exam: Blood pressure 130/58, pulse 53, temperature 96.9 F (36.1 C), temperature source Oral, resp. rate 18, height 5\' 9"  (1.753 m), weight 214 lb 4.8 oz (97.206 kg). ECOG: 1 General appearance: alert Head: Normocephalic, without obvious abnormality, atraumatic Neck: no adenopathy, no carotid bruit, no JVD, supple, symmetrical, trachea midline and thyroid not enlarged, symmetric, no tenderness/mass/nodules Lymph nodes: Cervical, supraclavicular, and axillary nodes normal. Heart:regular rate and rhythm, S1, S2 normal, no murmur, click, rub or gallop Lung:chest clear, no wheezing, rales, normal symmetric air entry Abdomen: soft, non-tender, without masses or organomegaly EXT:no erythema, induration, or nodules   Lab Results: Lab Results  Component Value Date   WBC 6.0 11/01/2012   HGB 11.5* 11/01/2012   HCT 34.0* 11/01/2012   MCV 85.6 11/01/2012   PLT 138* 11/01/2012     Chemistry      Component Value Date/Time   NA 140 10/17/2012 1256   NA 135 09/13/2012 0510   K 4.1 10/17/2012 1256   K 3.6 09/13/2012 0510   CL 104 10/17/2012 1256   CL 100 09/13/2012 0510   CO2 27 10/17/2012 1256   CO2 28 09/13/2012 0510   BUN 17.1 10/17/2012 1256   BUN 16 09/13/2012 0510   CREATININE 0.9 10/17/2012 1256   CREATININE 0.70 09/13/2012 0510   CREATININE 0.76 06/27/2012 0856      Component Value Date/Time   CALCIUM 9.7 10/17/2012 1256   CALCIUM 8.8 09/13/2012 0510   ALKPHOS 77 10/17/2012 1256   ALKPHOS 173* 09/04/2012 2215   AST 28 10/17/2012 1256   AST 39* 09/04/2012 2215   ALT 15 10/17/2012 1256   ALT 28 09/04/2012 2215   BILITOT 0.54 10/17/2012 1256   BILITOT 0.4 09/04/2012 2215      Impression and Plan:  79 year old man with :  1. Stage IV colon cancer. He is S/P sigmoid colon resection with a bulky tumor in the liver. On palliative  chemotherapy with FOLFIRI/Avastin. Here for cycle 2. Recommend that he proceed without dose modification.  2. Port management. Port in place, no flush will be needed.   3. Follow up: in 2 weeks for cycle 3.    [name], [place] 4/9/201410:32 AM

## 2012-11-01 NOTE — Patient Instructions (Addendum)
Bevacizumab injection What is this medicine? BEVACIZUMAB (be va SIZ yoo mab) is a chemotherapy drug. It targets a protein found in many cancer cell types, and halts cancer growth. This drug treats many cancers including non-small cell lung cancer, and colon or rectal cancer. It is usually given with other chemotherapy drugs. This medicine may be used for other purposes; ask your health care provider or pharmacist if you have questions. What should I tell my health care provider before I take this medicine? They need to know if you have any of these conditions: -blood clots -heart disease, including heart failure, heart attack, or chest pain (angina) -high blood pressure -infection (especially a virus infection such as chickenpox, cold sores, or herpes) -kidney disease -lung disease -prior chemotherapy with doxorubicin, daunorubicin, epirubicin, or other anthracycline type chemotherapy agents -recent or ongoing radiation therapy -recent surgery -stroke -an unusual or allergic reaction to bevacizumab, hamster proteins, mouse proteins, other medicines, foods, dyes, or preservatives -pregnant or trying to get pregnant -breast-feeding How should I use this medicine? This medicine is for infusion into a vein. It is given by a health care professional in a hospital or clinic setting. Talk to your pediatrician regarding the use of this medicine in children. Special care may be needed. Overdosage: If you think you have taken too much of this medicine contact a poison control center or emergency room at once. NOTE: This medicine is only for you. Do not share this medicine with others. What if I miss a dose? It is important not to miss your dose. Call your doctor or health care professional if you are unable to keep an appointment. What may interact with this medicine? Interactions are not expected. This list may not describe all possible interactions. Give your health care provider a list of all  the medicines, herbs, non-prescription drugs, or dietary supplements you use. Also tell them if you smoke, drink alcohol, or use illegal drugs. Some items may interact with your medicine. What should I watch for while using this medicine? Your condition will be monitored carefully while you are receiving this medicine. You will need important blood work and urine testing done while you are taking this medicine. During your treatment, let your health care professional know if you have any unusual symptoms, such as difficulty breathing. This medicine may rarely cause 'gastrointestinal perforation' (holes in the stomach, intestines or colon), a serious side effect requiring surgery to repair. This medicine should be started at least 28 days following major surgery and the site of the surgery should be totally healed. Check with your doctor before scheduling dental work or surgery while you are receiving this treatment. Talk to your doctor if you have recently had surgery or if you have a wound that has not healed. Do not become pregnant while taking this medicine. Women should inform their doctor if they wish to become pregnant or think they might be pregnant. There is a potential for serious side effects to an unborn child. Talk to your health care professional or pharmacist for more information. Do not breast-feed an infant while taking this medicine. This medicine has caused ovarian failure in some women. This medicine may interfere with the ability to have a child. You should talk to your doctor or health care professional if you are concerned about your fertility. What side effects may I notice from receiving this medicine? Side effects that you should report to your doctor or health care professional as soon as possible: -allergic reactions like skin   rash, itching or hives, swelling of the face, lips, or tongue -signs of infection - fever or chills, cough, sore throat, pain or trouble passing  urine -signs of decreased platelets or bleeding - bruising, pinpoint red spots on the skin, black, tarry stools, nosebleeds, blood in the urine -breathing problems -changes in vision -chest pain -confusion -jaw pain, especially after dental work -mouth sores -seizures -severe abdominal pain -severe headache -sudden numbness or weakness of the face, arm or leg -swelling of legs or ankles -symptoms of a stroke: change in mental awareness, inability to talk or move one side of the body (especially in patients with lung cancer) -trouble passing urine or change in the amount of urine -trouble speaking or understanding -trouble walking, dizziness, loss of balance or coordination Side effects that usually do not require medical attention (report to your doctor or health care professional if they continue or are bothersome): -constipation -diarrhea -dry skin -headache -loss of appetite -nausea, vomiting This list may not describe all possible side effects. Call your doctor for medical advice about side effects. You may report side effects to FDA at 1-800-FDA-1088. Where should I keep my medicine? This drug is given in a hospital or clinic and will not be stored at home. NOTE: This sheet is a summary. It may not cover all possible information. If you have questions about this medicine, talk to your doctor, pharmacist, or health care provider.  2013, [employer]. (06/12/2010 4:25:37 PM) Fluorouracil, 5-FU injection What is this medicine? FLUOROURACIL, 5-FU (flure oh YOOR a sil) is a chemotherapy drug. It slows the growth of cancer cells. This medicine is used to treat many types of cancer like breast cancer, colon or rectal cancer, pancreatic cancer, and stomach cancer. This medicine may be used for other purposes; ask your health care provider or pharmacist if you have questions. What should I tell my health care provider before I take this medicine? They need to know if you have  any of these conditions: -blood disorders -dihydropyrimidine dehydrogenase (DPD) deficiency -infection (especially a virus infection such as chickenpox, cold sores, or herpes) -kidney disease -liver disease -malnourished, poor nutrition -recent or ongoing radiation therapy -an unusual or allergic reaction to fluorouracil, other chemotherapy, other medicines, foods, dyes, or preservatives -pregnant or trying to get pregnant -breast-feeding How should I use this medicine? This drug is given as an infusion or injection into a vein. It is administered in a hospital or clinic by a specially trained health care professional. Talk to your pediatrician regarding the use of this medicine in children. Special care may be needed. Overdosage: If you think you have taken too much of this medicine contact a poison control center or emergency room at once. NOTE: This medicine is only for you. Do not share this medicine with others. What if I miss a dose? It is important not to miss your dose. Call your doctor or health care professional if you are unable to keep an appointment. What may interact with this medicine? -allopurinol -cimetidine -dapsone -digoxin -hydroxyurea -leucovorin -levamisole -medicines for seizures like ethotoin, fosphenytoin, phenytoin -medicines to increase blood counts like filgrastim, pegfilgrastim, sargramostim -medicines that treat or prevent blood clots like warfarin, enoxaparin, and dalteparin -methotrexate -metronidazole -pyrimethamine -some other chemotherapy drugs like busulfan, cisplatin, estramustine, vinblastine -trimethoprim -trimetrexate -vaccines Talk to your doctor or health care professional before taking any of these medicines: -acetaminophen -aspirin -ibuprofen -ketoprofen -naproxen This list may not describe all possible interactions. Give your health care provider a list of all   the medicines, herbs, non-prescription drugs, or dietary supplements  you use. Also tell them if you smoke, drink alcohol, or use illegal drugs. Some items may interact with your medicine. What should I watch for while using this medicine? Visit your doctor for checks on your progress. This drug may make you feel generally unwell. This is not uncommon, as chemotherapy can affect healthy cells as well as cancer cells. Report any side effects. Continue your course of treatment even though you feel ill unless your doctor tells you to stop. In some cases, you may be given additional medicines to help with side effects. Follow all directions for their use. Call your doctor or health care professional for advice if you get a fever, chills or sore throat, or other symptoms of a cold or flu. Do not treat yourself. This drug decreases your body's ability to fight infections. Try to avoid being around people who are sick. This medicine may increase your risk to bruise or bleed. Call your doctor or health care professional if you notice any unusual bleeding. Be careful brushing and flossing your teeth or using a toothpick because you may get an infection or bleed more easily. If you have any dental work done, tell your dentist you are receiving this medicine. Avoid taking products that contain aspirin, acetaminophen, ibuprofen, naproxen, or ketoprofen unless instructed by your doctor. These medicines may hide a fever. Do not become pregnant while taking this medicine. Women should inform their doctor if they wish to become pregnant or think they might be pregnant. There is a potential for serious side effects to an unborn child. Talk to your health care professional or pharmacist for more information. Do not breast-feed an infant while taking this medicine. Men should inform their doctor if they wish to father a child. This medicine may lower sperm counts. Do not treat diarrhea with over the counter products. Contact your doctor if you have diarrhea that lasts more than 2 days or if it  is severe and watery. This medicine can make you more sensitive to the sun. Keep out of the sun. If you cannot avoid being in the sun, wear protective clothing and use sunscreen. Do not use sun lamps or tanning beds/booths. What side effects may I notice from receiving this medicine? Side effects that you should report to your doctor or health care professional as soon as possible: -allergic reactions like skin rash, itching or hives, swelling of the face, lips, or tongue -low blood counts - this medicine may decrease the number of white blood cells, red blood cells and platelets. You may be at increased risk for infections and bleeding. -signs of infection - fever or chills, cough, sore throat, pain or difficulty passing urine -signs of decreased platelets or bleeding - bruising, pinpoint red spots on the skin, black, tarry stools, blood in the urine -signs of decreased red blood cells - unusually weak or tired, fainting spells, lightheadedness -breathing problems -changes in vision -chest pain -mouth sores -nausea and vomiting -pain, swelling, redness at site where injected -pain, tingling, numbness in the hands or feet -redness, swelling, or sores on hands or feet -stomach pain -unusual bleeding Side effects that usually do not require medical attention (report to your doctor or health care professional if they continue or are bothersome): -changes in finger or toe nails -diarrhea -dry or itchy skin -hair loss -headache -loss of appetite -sensitivity of eyes to the light -stomach upset -unusually teary eyes This list may not describe all possible   side effects. Call your doctor for medical advice about side effects. You may report side effects to FDA at 1-800-FDA-1088. Where should I keep my medicine? This drug is given in a hospital or clinic and will not be stored at home. NOTE: This sheet is a summary. It may not cover all possible information. If you have questions about this  medicine, talk to your doctor, pharmacist, or health care provider.  2013, [employer]. (11/15/2007 1:53:16 PM) Leucovorin injection What is this medicine? LEUCOVORIN (loo koe VOR in) is used to prevent or treat the harmful effects of some medicines. This medicine is used to treat anemia caused by a low amount of folic acid in the body. It is also used with 5-fluorouracil (5-FU) to treat colon cancer. This medicine may be used for other purposes; ask your health care provider or pharmacist if you have questions. What should I tell my health care provider before I take this medicine? They need to know if you have any of these conditions: -anemia from low levels of vitamin B-12 in the blood -an unusual or allergic reaction to leucovorin, folic acid, other medicines, foods, dyes, or preservatives -pregnant or trying to get pregnant -breast-feeding How should I use this medicine? This medicine is for injection into a muscle or into a vein. It is given by a health care professional in a hospital or clinic setting. Talk to your pediatrician regarding the use of this medicine in children. Special care may be needed. Overdosage: If you think you have taken too much of this medicine contact a poison control center or emergency room at once. NOTE: This medicine is only for you. Do not share this medicine with others. What if I miss a dose? This does not apply. What may interact with this medicine? -capecitabine -fluorouracil -phenobarbital -phenytoin -primidone -trimethoprim-sulfamethoxazole This list may not describe all possible interactions. Give your health care provider a list of all the medicines, herbs, non-prescription drugs, or dietary supplements you use. Also tell them if you smoke, drink alcohol, or use illegal drugs. Some items may interact with your medicine. What should I watch for while using this medicine? Your condition will be monitored carefully while you are  receiving this medicine. This medicine may increase the side effects of 5-fluorouracil, 5-FU. Tell your doctor or health care professional if you have diarrhea or mouth sores that do not get better or that get worse. What side effects may I notice from receiving this medicine? Side effects that you should report to your doctor or health care professional as soon as possible: -allergic reactions like skin rash, itching or hives, swelling of the face, lips, or tongue -breathing problems -fever, infection -mouth sores -unusual bleeding or bruising -unusually weak or tired Side effects that usually do not require medical attention (report to your doctor or health care professional if they continue or are bothersome): -constipation or diarrhea -loss of appetite -nausea, vomiting This list may not describe all possible side effects. Call your doctor for medical advice about side effects. You may report side effects to FDA at 1-800-FDA-1088. Where should I keep my medicine? This drug is given in a hospital or clinic and will not be stored at home. NOTE: This sheet is a summary. It may not cover all possible information. If you have questions about this medicine, talk to your doctor, pharmacist, or health care provider.  2012, [employer]. (01/16/2008 4:50:29 PM)Irinotecan injection What is this medicine? IRINOTECAN (ir in oh TEE kan ) is a   chemotherapy drug. It is used to treat colon and rectal cancer. This medicine may be used for other purposes; ask your health care provider or pharmacist if you have questions. What should I tell my health care provider before I take this medicine? They need to know if you have any of these conditions: -blood disorders -dehydration -diarrhea -infection (especially a virus infection such as chickenpox, cold sores, or herpes) -liver disease -low blood counts, like low white cell, platelet, or red cell counts -recent or ongoing radiation therapy -an  unusual or allergic reaction to irinotecan, sorbitol, other chemotherapy, other medicines, foods, dyes, or preservatives -pregnant or trying to get pregnant -breast-feeding How should I use this medicine? This drug is given as an infusion into a vein. It is administered in a hospital or clinic by a specially trained health care professional. Talk to your pediatrician regarding the use of this medicine in children. Special care may be needed. Overdosage: If you think you have taken too much of this medicine contact a poison control center or emergency room at once. NOTE: This medicine is only for you. Do not share this medicine with others. What if I miss a dose? It is important not to miss your dose. Call your doctor or health care professional if you are unable to keep an appointment. What may interact with this medicine? Do not take this medicine with any of the following medications: -atazanavir -ketoconazole -St. Zayvian's Wort This medicine may also interact with the following medications: -dexamethasone -diuretics -laxatives -medicines for seizures like carbamazepine, mephobarbital, phenobarbital, phenytoin, primidone -medicines to increase blood counts like filgrastim, pegfilgrastim, sargramostim -prochlorperazine -vaccines This list may not describe all possible interactions. Give your health care provider a list of all the medicines, herbs, non-prescription drugs, or dietary supplements you use. Also tell them if you smoke, drink alcohol, or use illegal drugs. Some items may interact with your medicine. What should I watch for while using this medicine? Your condition will be monitored carefully while you are receiving this medicine. You will need important blood work done while you are taking this medicine. This drug may make you feel generally unwell. This is not uncommon, as chemotherapy can affect healthy cells as well as cancer cells. Report any side effects. Continue your course  of treatment even though you feel ill unless your doctor tells you to stop. In some cases, you may be given additional medicines to help with side effects. Follow all directions for their use. You may get drowsy or dizzy. Do not drive, use machinery, or do anything that needs mental alertness until you know how this medicine affects you. Do not stand or sit up quickly, especially if you are an older patient. This reduces the risk of dizzy or fainting spells. Call your doctor or health care professional for advice if you get a fever, chills or sore throat, or other symptoms of a cold or flu. Do not treat yourself. This drug decreases your body's ability to fight infections. Try to avoid being around people who are sick. This medicine may increase your risk to bruise or bleed. Call your doctor or health care professional if you notice any unusual bleeding. Be careful brushing and flossing your teeth or using a toothpick because you may get an infection or bleed more easily. If you have any dental work done, tell your dentist you are receiving this medicine. Avoid taking products that contain aspirin, acetaminophen, ibuprofen, naproxen, or ketoprofen unless instructed by your doctor. These medicines may   hide a fever. Do not become pregnant while taking this medicine. Women should inform their doctor if they wish to become pregnant or think they might be pregnant. There is a potential for serious side effects to an unborn child. Talk to your health care professional or pharmacist for more information. Do not breast-feed an infant while taking this medicine. What side effects may I notice from receiving this medicine? Side effects that you should report to your doctor or health care professional as soon as possible: -allergic reactions like skin rash, itching or hives, swelling of the face, lips, or tongue -low blood counts - this medicine may decrease the number of white blood cells, red blood cells and  platelets. You may be at increased risk for infections and bleeding. -signs of infection - fever or chills, cough, sore throat, pain or difficulty passing urine -signs of decreased platelets or bleeding - bruising, pinpoint red spots on the skin, black, tarry stools, blood in the urine -signs of decreased red blood cells - unusually weak or tired, fainting spells, lightheadedness -breathing problems -chest pain -diarrhea -feeling faint or lightheaded, falls -flushing, runny nose, sweating during infusion -mouth sores or pain -pain, swelling, redness or irritation where injected -pain, swelling, warmth in the leg -pain, tingling, numbness in the hands or feet -problems with balance, talking, walking -stomach cramps, pain -trouble passing urine or change in the amount of urine -vomiting as to be unable to hold down drinks or food -yellowing of the eyes or skin Side effects that usually do not require medical attention (report to your doctor or health care professional if they continue or are bothersome): -constipation -hair loss -headache -loss of appetite -nausea, vomiting -stomach upset This list may not describe all possible side effects. Call your doctor for medical advice about side effects. You may report side effects to FDA at 1-800-FDA-1088. Where should I keep my medicine? This drug is given in a hospital or clinic and will not be stored at home. NOTE: This sheet is a summary. It may not cover all possible information. If you have questions about this medicine, talk to your doctor, pharmacist, or health care provider.  2013, [employer]. (11/28/2007 4:29:12 PM)  

## 2012-11-02 ENCOUNTER — Other Ambulatory Visit: Payer: Self-pay | Admitting: Certified Registered Nurse Anesthetist

## 2012-11-03 ENCOUNTER — Ambulatory Visit (HOSPITAL_BASED_OUTPATIENT_CLINIC_OR_DEPARTMENT_OTHER): Payer: Medicare Other

## 2012-11-03 VITALS — BP 121/51 | HR 58 | Temp 97.7°F

## 2012-11-03 DIAGNOSIS — C787 Secondary malignant neoplasm of liver and intrahepatic bile duct: Secondary | ICD-10-CM

## 2012-11-03 DIAGNOSIS — Z452 Encounter for adjustment and management of vascular access device: Secondary | ICD-10-CM

## 2012-11-03 DIAGNOSIS — C187 Malignant neoplasm of sigmoid colon: Secondary | ICD-10-CM

## 2012-11-03 MED ORDER — HEPARIN SOD (PORK) LOCK FLUSH 100 UNIT/ML IV SOLN
500.0000 [IU] | Freq: Once | INTRAVENOUS | Status: AC | PRN
  Administered 2012-11-03: 500 [IU]
  Filled 2012-11-03: qty 5

## 2012-11-03 MED ORDER — SODIUM CHLORIDE 0.9 % IJ SOLN
10.0000 mL | INTRAMUSCULAR | Status: DC | PRN
  Administered 2012-11-03: 10 mL
  Filled 2012-11-03: qty 10

## 2012-11-13 ENCOUNTER — Encounter: Payer: Self-pay | Admitting: *Deleted

## 2012-11-15 ENCOUNTER — Ambulatory Visit (HOSPITAL_BASED_OUTPATIENT_CLINIC_OR_DEPARTMENT_OTHER): Payer: Medicare Other

## 2012-11-15 ENCOUNTER — Ambulatory Visit (HOSPITAL_BASED_OUTPATIENT_CLINIC_OR_DEPARTMENT_OTHER): Payer: Medicare Other | Admitting: Oncology

## 2012-11-15 ENCOUNTER — Telehealth: Payer: Self-pay | Admitting: *Deleted

## 2012-11-15 ENCOUNTER — Other Ambulatory Visit (HOSPITAL_BASED_OUTPATIENT_CLINIC_OR_DEPARTMENT_OTHER): Payer: Medicare Other | Admitting: Lab

## 2012-11-15 ENCOUNTER — Encounter: Payer: Self-pay | Admitting: *Deleted

## 2012-11-15 ENCOUNTER — Telehealth: Payer: Self-pay | Admitting: Oncology

## 2012-11-15 VITALS — BP 148/65 | HR 63 | Temp 97.3°F | Resp 18 | Ht 69.0 in | Wt 211.8 lb

## 2012-11-15 DIAGNOSIS — Z5112 Encounter for antineoplastic immunotherapy: Secondary | ICD-10-CM

## 2012-11-15 DIAGNOSIS — Z5111 Encounter for antineoplastic chemotherapy: Secondary | ICD-10-CM

## 2012-11-15 DIAGNOSIS — C187 Malignant neoplasm of sigmoid colon: Secondary | ICD-10-CM

## 2012-11-15 DIAGNOSIS — C787 Secondary malignant neoplasm of liver and intrahepatic bile duct: Secondary | ICD-10-CM

## 2012-11-15 LAB — COMPREHENSIVE METABOLIC PANEL (CC13)
ALT: 9 U/L (ref 0–55)
AST: 17 U/L (ref 5–34)
Albumin: 3.1 g/dL — ABNORMAL LOW (ref 3.5–5.0)
Alkaline Phosphatase: 52 U/L (ref 40–150)
Glucose: 113 mg/dl — ABNORMAL HIGH (ref 70–99)
Potassium: 3.8 mEq/L (ref 3.5–5.1)
Sodium: 137 mEq/L (ref 136–145)
Total Bilirubin: 0.64 mg/dL (ref 0.20–1.20)
Total Protein: 6.4 g/dL (ref 6.4–8.3)

## 2012-11-15 LAB — CBC WITH DIFFERENTIAL/PLATELET
Basophils Absolute: 0 10*3/uL (ref 0.0–0.1)
EOS%: 4.8 % (ref 0.0–7.0)
HGB: 11.9 g/dL — ABNORMAL LOW (ref 13.0–17.1)
MCH: 29.5 pg (ref 27.2–33.4)
MCV: 85.1 fL (ref 79.3–98.0)
MONO%: 8 % (ref 0.0–14.0)
NEUT#: 6.1 10*3/uL (ref 1.5–6.5)
RBC: 4.04 10*6/uL — ABNORMAL LOW (ref 4.20–5.82)
RDW: 15.8 % — ABNORMAL HIGH (ref 11.0–14.6)
lymph#: 1.5 10*3/uL (ref 0.9–3.3)
nRBC: 0 % (ref 0–0)

## 2012-11-15 MED ORDER — SODIUM CHLORIDE 0.9 % IV SOLN
1800.0000 mg/m2 | INTRAVENOUS | Status: DC
  Administered 2012-11-15: 3900 mg via INTRAVENOUS
  Filled 2012-11-15: qty 78

## 2012-11-15 MED ORDER — SODIUM CHLORIDE 0.9 % IJ SOLN
10.0000 mL | INTRAMUSCULAR | Status: DC | PRN
  Filled 2012-11-15: qty 10

## 2012-11-15 MED ORDER — LEUCOVORIN CALCIUM INJECTION 350 MG
301.0000 mg/m2 | Freq: Once | INTRAVENOUS | Status: AC
  Administered 2012-11-15: 650 mg via INTRAVENOUS
  Filled 2012-11-15: qty 32.5

## 2012-11-15 MED ORDER — DEXTROSE 5 % IV SOLN
125.0000 mg/m2 | Freq: Once | INTRAVENOUS | Status: AC
  Administered 2012-11-15: 270 mg via INTRAVENOUS
  Filled 2012-11-15: qty 13.5

## 2012-11-15 MED ORDER — SODIUM CHLORIDE 0.9 % IV SOLN
Freq: Once | INTRAVENOUS | Status: AC
  Administered 2012-11-15: 10:00:00 via INTRAVENOUS

## 2012-11-15 MED ORDER — ONDANSETRON 16 MG/50ML IVPB (CHCC)
16.0000 mg | Freq: Once | INTRAVENOUS | Status: AC
  Administered 2012-11-15: 16 mg via INTRAVENOUS

## 2012-11-15 MED ORDER — SODIUM CHLORIDE 0.9 % IV SOLN
5.0000 mg/kg | Freq: Once | INTRAVENOUS | Status: AC
  Administered 2012-11-15: 475 mg via INTRAVENOUS
  Filled 2012-11-15: qty 19

## 2012-11-15 MED ORDER — DEXAMETHASONE SODIUM PHOSPHATE 4 MG/ML IJ SOLN
20.0000 mg | Freq: Once | INTRAMUSCULAR | Status: AC
  Administered 2012-11-15: 20 mg via INTRAVENOUS

## 2012-11-15 MED ORDER — FLUOROURACIL CHEMO INJECTION 2.5 GM/50ML
300.0000 mg/m2 | Freq: Once | INTRAVENOUS | Status: AC
  Administered 2012-11-15: 650 mg via INTRAVENOUS
  Filled 2012-11-15: qty 13

## 2012-11-15 MED ORDER — HEPARIN SOD (PORK) LOCK FLUSH 100 UNIT/ML IV SOLN
500.0000 [IU] | Freq: Once | INTRAVENOUS | Status: DC | PRN
  Filled 2012-11-15: qty 5

## 2012-11-15 NOTE — Patient Instructions (Signed)
[facility] Discharge Instructions for Patients Receiving Chemotherapy  Today you received the following chemotherapy agents: avastin, leucovorin, irinotecan, 106fu, 69fu pump.  To help prevent nausea and vomiting after your treatment, we encourage you to take your nausea medication.  Take it as often as prescribed.     If you develop nausea and vomiting that is not controlled by your nausea medication, call the clinic. If it is after clinic hours your family physician or the after hours number for the clinic or go to the Emergency Department.   BELOW ARE SYMPTOMS THAT SHOULD BE REPORTED IMMEDIATELY:  *FEVER GREATER THAN 100.5 F  *CHILLS WITH OR WITHOUT FEVER  NAUSEA AND VOMITING THAT IS NOT CONTROLLED WITH YOUR NAUSEA MEDICATION  *UNUSUAL SHORTNESS OF BREATH  *UNUSUAL BRUISING OR BLEEDING  TENDERNESS IN MOUTH AND THROAT WITH OR WITHOUT PRESENCE OF ULCERS  *URINARY PROBLEMS  *BOWEL PROBLEMS  UNUSUAL RASH Items with * indicate a potential emergency and should be followed up as soon as possible.  Feel free to call the clinic you have any questions or concerns. The clinic phone number is [phone].   I have been informed and understand all the instructions given to me. I know to contact the clinic, my physician, or go to the Emergency Department if any problems should occur. I do not have any questions at this time, but understand that I may call the clinic during office hours   should I have any questions or need assistance in obtaining follow up care.    __________________________________________  _____________  __________ Signature of Patient or Authorized Representative            Date                   Time    __________________________________________ Nurse's Signature

## 2012-11-15 NOTE — Telephone Encounter (Signed)
Per staff phone call and POF I have schedueld appts.  JMW  

## 2012-11-15 NOTE — Progress Notes (Signed)
Hematology and Oncology Follow Up Visit  Todd [name] [id number] [birth year] 79 y.o. 11/15/2012 8:40 AM [name], MDBreen, [name], MD   Principle Diagnosis: 79 year old with stage IV colon cancer diagnosed in 08/2012. He presented with a colonic mass and liver lesions.  KRAS wild type.   Prior Therapy: He is S/P LAPAROSCOPIC ASSISTED SIGMOID COLON RESECTION, END Left OSTOMY, Hartmann's pouch and acement of power port done on 09/07/2012.   Current therapy:  He started FOLFIRI and Avastin on 3/26. Here for cycle 3 today.  Interim History: Mr. Yoo presents today for a follow up visit. He is a very nice man with stage IV colon cancer. He under went surgical resection on 09/07/2012 and has been doing well.He Received first two cycles of chemotherapy that was tolerated well. Had mild diarrhea for about 3 days and took Imodium which then caused constipation. Using prunes to help with constipation. No chest pain, shortness of breath, abdominal pain, nausea, or vomiting. No bleeding. Appetite and weight stable. No bleeding noted. Overall, continues to have good quality of life.    Medications: I have reviewed the patient's current medications. Current outpatient prescriptions:aspirin 81 MG tablet, Take 81 mg by mouth daily.  , Disp: , Rfl: ;  benazepril (LOTENSIN) 10 MG tablet, Take 1 tablet (10 mg total) by mouth daily., Disp: 90 tablet, Rfl: 3;  chlorhexidine (PERIDEX) 0.12 % solution, Use as directed 15 mLs in the mouth or throat 2 (two) times daily. Use as directed for mouth irritation, Disp: , Rfl:  colchicine 0.6 MG tablet, Take 0.6 mg by mouth daily as needed (for gout flares)., Disp: , Rfl: ;  diclofenac (VOLTAREN) 75 MG EC tablet, Take 1 tablet (75 mg total) by mouth 2 (two) times daily as needed., Disp: 180 tablet, Rfl: 0;  doxazosin (CARDURA) 8 MG tablet, Take 8 mg by mouth daily. , Disp: , Rfl: ;  feeding supplement (ENSURE COMPLETE) LIQD, Take 237 mLs by mouth 2 (two) times daily between  meals., Disp: 60 Bottle, Rfl: 2 gabapentin (NEURONTIN) 100 MG capsule, , Disp: , Rfl: ;  hydrochlorothiazide (HYDRODIURIL) 25 MG tablet, Take 1 tablet (25 mg total) by mouth daily., Disp: 90 tablet, Rfl: 3;  isosorbide mononitrate (IMDUR) 60 MG 24 hr tablet, Take 1 tablet (60 mg total) by mouth daily., Disp: 90 tablet, Rfl: 3 lidocaine-prilocaine (EMLA) cream, Apply topically as needed. Prior to chemotherapy treatment, Apply approx 1/2 teaspoon to skin over port, do not rub in. Cover with plastic,, Disp: 30 g, Rfl: 1;  loratadine (CLARITIN) 10 MG tablet, Take 10 mg by mouth daily.  , Disp: , Rfl: ;  metoprolol succinate (TOPROL-XL) 50 MG 24 hr tablet, Take 50 mg by mouth daily. Take with or immediately following a meal., Disp: , Rfl:  Multiple Vitamin (MULTIVITAMIN) tablet, Take 1 tablet by mouth daily.  , Disp: , Rfl: ;  nitroGLYCERIN (NITROSTAT) 0.4 MG SL tablet, Place 0.4 mg under the tongue every 5 (five) minutes as needed.  , Disp: , Rfl: ;  Polyethyl Glycol-Propyl Glycol (SYSTANE OP), Apply 1 drop to eye daily. 1 or 2 drops in the affected eye as needed, Disp: , Rfl:  prochlorperazine (COMPAZINE) 10 MG tablet, Take 1 tablet (10 mg total) by mouth every 6 (six) hours as needed., Disp: 30 tablet, Rfl: 1;  simvastatin (ZOCOR) 40 MG tablet, Take 1 tablet (40 mg total) by mouth at bedtime., Disp: 90 tablet, Rfl: 3;  zoster vaccine live, PF, (ZOSTAVAX) 11914 UNT/0.65ML injection, Inject into  the skin once. Dispense to patient to be admin in doctors office , Disp: , Rfl:   Allergies: No Known Allergies  Past Medical History, Surgical history, Social history, and Family History were reviewed and updated.  Review of Systems: Constitutional:  Negative for fever, chills, night sweats, anorexia, weight loss, pain. Cardiovascular: no chest pain or dyspnea on exertion Respiratory: negative Neurological: negative Dermatological: negative ENT: negative Skin: Negative. Gastrointestinal:  negative Genito-Urinary: negative Hematological and Lymphatic:  Breast: negative Musculoskeletal: negative Remaining ROS negative.  Physical Exam: Blood pressure 148/65, pulse 63, temperature 97.3 F (36.3 C), temperature source Oral, resp. rate 18, height 5\' 9"  (1.753 m), weight 211 lb 12.8 oz (96.072 kg). ECOG: 1 General appearance: alert Head: Normocephalic, without obvious abnormality, atraumatic Neck: no adenopathy, no carotid bruit, no JVD, supple, symmetrical, trachea midline and thyroid not enlarged, symmetric, no tenderness/mass/nodules Lymph nodes: Cervical, supraclavicular, and axillary nodes normal. Heart:regular rate and rhythm, S1, S2 normal, no murmur, click, rub or gallop Lung:chest clear, no wheezing, rales, normal symmetric air entry Abdomen: soft, non-tender, without masses or organomegaly EXT:no erythema, induration, or nodules   Lab Results: Lab Results  Component Value Date   WBC 8.8 11/15/2012   HGB 11.9* 11/15/2012   HCT 34.4* 11/15/2012   MCV 85.1 11/15/2012   PLT 140 11/15/2012     Chemistry      Component Value Date/Time   NA 138 11/01/2012 0805   NA 135 09/13/2012 0510   K 4.2 11/01/2012 0805   K 3.6 09/13/2012 0510   CL 106 11/01/2012 0805   CL 100 09/13/2012 0510   CO2 24 11/01/2012 0805   CO2 28 09/13/2012 0510   BUN 21.4 11/01/2012 0805   BUN 16 09/13/2012 0510   CREATININE 1.0 11/01/2012 0805   CREATININE 0.70 09/13/2012 0510   CREATININE 0.76 06/27/2012 0856      Component Value Date/Time   CALCIUM 8.9 11/01/2012 0805   CALCIUM 8.8 09/13/2012 0510   ALKPHOS 58 11/01/2012 0805   ALKPHOS 173* 09/04/2012 2215   AST 19 11/01/2012 0805   AST 39* 09/04/2012 2215   ALT 10 11/01/2012 0805   ALT 28 09/04/2012 2215   BILITOT 0.57 11/01/2012 0805   BILITOT 0.4 09/04/2012 2215      Impression and Plan:  79 year old man with :  1. Stage IV colon cancer. He is S/P sigmoid colon resection with a bulky tumor in the liver. On palliative chemotherapy with FOLFIRI/Avastin.  Here for cycle 3. Recommend that he proceed without dose modification. He will get a CT scan after cycle 4.   2. Port management. Port in place, no flush will be needed.   3. Follow up: in 2 weeks for cycle 4.    Caliyah Sieh 4/23/20148:40 AM

## 2012-11-16 ENCOUNTER — Encounter: Payer: Self-pay | Admitting: *Deleted

## 2012-11-16 NOTE — Progress Notes (Unsigned)
Faxed signed order, for ostomy supplies to [facility]  [phone]

## 2012-11-17 ENCOUNTER — Ambulatory Visit (HOSPITAL_BASED_OUTPATIENT_CLINIC_OR_DEPARTMENT_OTHER): Payer: Medicare Other

## 2012-11-17 VITALS — BP 133/61 | HR 52 | Temp 98.1°F

## 2012-11-17 DIAGNOSIS — C787 Secondary malignant neoplasm of liver and intrahepatic bile duct: Secondary | ICD-10-CM

## 2012-11-17 DIAGNOSIS — Z452 Encounter for adjustment and management of vascular access device: Secondary | ICD-10-CM

## 2012-11-17 DIAGNOSIS — C187 Malignant neoplasm of sigmoid colon: Secondary | ICD-10-CM

## 2012-11-17 DIAGNOSIS — C189 Malignant neoplasm of colon, unspecified: Secondary | ICD-10-CM

## 2012-11-17 MED ORDER — SODIUM CHLORIDE 0.9 % IJ SOLN
10.0000 mL | INTRAMUSCULAR | Status: DC | PRN
  Administered 2012-11-17: 10 mL
  Filled 2012-11-17: qty 10

## 2012-11-17 MED ORDER — HEPARIN SOD (PORK) LOCK FLUSH 100 UNIT/ML IV SOLN
500.0000 [IU] | Freq: Once | INTRAVENOUS | Status: AC | PRN
  Administered 2012-11-17: 500 [IU]
  Filled 2012-11-17: qty 5

## 2012-11-29 ENCOUNTER — Ambulatory Visit (HOSPITAL_BASED_OUTPATIENT_CLINIC_OR_DEPARTMENT_OTHER): Payer: Medicare Other | Admitting: Oncology

## 2012-11-29 ENCOUNTER — Other Ambulatory Visit (HOSPITAL_BASED_OUTPATIENT_CLINIC_OR_DEPARTMENT_OTHER): Payer: Medicare Other | Admitting: Lab

## 2012-11-29 ENCOUNTER — Telehealth: Payer: Self-pay | Admitting: Family Medicine

## 2012-11-29 ENCOUNTER — Ambulatory Visit (HOSPITAL_BASED_OUTPATIENT_CLINIC_OR_DEPARTMENT_OTHER): Payer: Medicare Other

## 2012-11-29 ENCOUNTER — Encounter: Payer: Self-pay | Admitting: Oncology

## 2012-11-29 VITALS — BP 144/71 | HR 57 | Temp 97.0°F | Resp 18 | Ht 69.0 in | Wt 208.9 lb

## 2012-11-29 DIAGNOSIS — C189 Malignant neoplasm of colon, unspecified: Secondary | ICD-10-CM

## 2012-11-29 DIAGNOSIS — C787 Secondary malignant neoplasm of liver and intrahepatic bile duct: Secondary | ICD-10-CM

## 2012-11-29 DIAGNOSIS — C187 Malignant neoplasm of sigmoid colon: Secondary | ICD-10-CM

## 2012-11-29 DIAGNOSIS — B379 Candidiasis, unspecified: Secondary | ICD-10-CM

## 2012-11-29 DIAGNOSIS — Z5112 Encounter for antineoplastic immunotherapy: Secondary | ICD-10-CM

## 2012-11-29 DIAGNOSIS — Z5111 Encounter for antineoplastic chemotherapy: Secondary | ICD-10-CM

## 2012-11-29 LAB — CBC WITH DIFFERENTIAL/PLATELET
BASO%: 0.3 % (ref 0.0–2.0)
Basophils Absolute: 0 10*3/uL (ref 0.0–0.1)
HCT: 35.5 % — ABNORMAL LOW (ref 38.4–49.9)
HGB: 12.3 g/dL — ABNORMAL LOW (ref 13.0–17.1)
MONO#: 0.8 10*3/uL (ref 0.1–0.9)
NEUT%: 76.4 % — ABNORMAL HIGH (ref 39.0–75.0)
WBC: 10.7 10*3/uL — ABNORMAL HIGH (ref 4.0–10.3)
lymph#: 1.4 10*3/uL (ref 0.9–3.3)

## 2012-11-29 LAB — CEA: CEA: 2.1 ng/mL (ref 0.0–5.0)

## 2012-11-29 LAB — COMPREHENSIVE METABOLIC PANEL (CC13)
ALT: 11 U/L (ref 0–55)
Albumin: 3.1 g/dL — ABNORMAL LOW (ref 3.5–5.0)
CO2: 25 mEq/L (ref 22–29)
Calcium: 8.8 mg/dL (ref 8.4–10.4)
Chloride: 105 mEq/L (ref 98–107)
Creatinine: 0.8 mg/dL (ref 0.7–1.3)
Potassium: 3.8 mEq/L (ref 3.5–5.1)

## 2012-11-29 MED ORDER — FLUOROURACIL CHEMO INJECTION 2.5 GM/50ML
300.0000 mg/m2 | Freq: Once | INTRAVENOUS | Status: AC
  Administered 2012-11-29: 650 mg via INTRAVENOUS
  Filled 2012-11-29: qty 13

## 2012-11-29 MED ORDER — SODIUM CHLORIDE 0.9 % IV SOLN: Freq: Once | INTRAVENOUS | Status: DC

## 2012-11-29 MED ORDER — NYSTATIN 100000 UNIT/GM EX CREA: TOPICAL_CREAM | Freq: Two times a day (BID) | CUTANEOUS | Status: DC

## 2012-11-29 MED ORDER — SODIUM CHLORIDE 0.9 % IV SOLN
5.0000 mg/kg | Freq: Once | INTRAVENOUS | Status: AC
  Administered 2012-11-29: 475 mg via INTRAVENOUS
  Filled 2012-11-29: qty 19

## 2012-11-29 MED ORDER — SODIUM CHLORIDE 0.9 % IV SOLN
1800.0000 mg/m2 | INTRAVENOUS | Status: DC
  Administered 2012-11-29: 3900 mg via INTRAVENOUS
  Filled 2012-11-29: qty 78

## 2012-11-29 MED ORDER — SODIUM CHLORIDE 0.9 % IV SOLN
Freq: Once | INTRAVENOUS | Status: AC
  Administered 2012-11-29: 11:00:00 via INTRAVENOUS

## 2012-11-29 MED ORDER — DEXAMETHASONE SODIUM PHOSPHATE 20 MG/5ML IJ SOLN
20.0000 mg | Freq: Once | INTRAMUSCULAR | Status: AC
  Administered 2012-11-29: 20 mg via INTRAVENOUS

## 2012-11-29 MED ORDER — ONDANSETRON 16 MG/50ML IVPB (CHCC)
16.0000 mg | Freq: Once | INTRAVENOUS | Status: AC
  Administered 2012-11-29: 16 mg via INTRAVENOUS

## 2012-11-29 MED ORDER — ATROPINE SULFATE 1 MG/ML IJ SOLN
0.5000 mg | Freq: Once | INTRAMUSCULAR | Status: AC | PRN
  Administered 2012-11-29: 0.5 mg via INTRAVENOUS

## 2012-11-29 MED ORDER — IRINOTECAN HCL CHEMO INJECTION 100 MG/5ML
125.0000 mg/m2 | Freq: Once | INTRAVENOUS | Status: AC
  Administered 2012-11-29: 270 mg via INTRAVENOUS
  Filled 2012-11-29: qty 13.5

## 2012-11-29 MED ORDER — LEUCOVORIN CALCIUM INJECTION 350 MG
301.0000 mg/m2 | Freq: Once | INTRAMUSCULAR | Status: AC
  Administered 2012-11-29: 650 mg via INTRAVENOUS
  Filled 2012-11-29: qty 32.5

## 2012-11-29 NOTE — Progress Notes (Signed)
Hematology and Oncology Follow Up Visit  Todd [name] [id number] [birth year] 79 y.o. 11/29/2012 10:26 AM Todd [name], MDBreen, Todd Lamas, MD   Principle Diagnosis: 79 year old with stage IV colon cancer diagnosed in 08/2012. He presented with a colonic mass and liver lesions.  KRAS wild type.   Prior Therapy: He is S/P LAPAROSCOPIC ASSISTED SIGMOID COLON RESECTION, END Left OSTOMY, Hartmann's pouch and acement of power port done on 09/07/2012.   Current therapy:  He started FOLFIRI and Avastin on 3/26. Here for cycle 4 today.  Interim History: Todd [name] presents today for a follow up visit. He is a very nice man with stage IV colon cancer. He under went surgical resection on 09/07/2012 and has been doing well.He Received first three cycles of chemotherapy that was tolerated well. Had mild diarrhea for about 3 days and took Imodium. No longer having issues with constipation. No chest pain, shortness of breath, abdominal pain, nausea, or vomiting. No bleeding. Appetite and weight stable. No bleeding noted. Has a rash to his abdomen where his pants rub against his skin. He has been putting Neosporin on this and covering with a dressing. Overall, continues to have good quality of life.    Medications: I have reviewed the patient's current medications. Current outpatient prescriptions:aspirin 81 MG tablet, Take 81 mg by mouth daily.  , Disp: , Rfl: ;  benazepril (LOTENSIN) 10 MG tablet, Take 1 tablet (10 mg total) by mouth daily., Disp: 90 tablet, Rfl: 3;  chlorhexidine (PERIDEX) 0.12 % solution, Use as directed 15 mLs in the mouth or throat 2 (two) times daily. Use as directed for mouth irritation, Disp: , Rfl:  colchicine 0.6 MG tablet, Take 0.6 mg by mouth daily as needed (for gout flares)., Disp: , Rfl: ;  diclofenac (VOLTAREN) 75 MG EC tablet, Take 1 tablet (75 mg total) by mouth 2 (two) times daily as needed., Disp: 180 tablet, Rfl: 0;  doxazosin (CARDURA) 8 MG tablet, Take 8 mg by mouth daily. ,  Disp: , Rfl: ;  feeding supplement (ENSURE COMPLETE) LIQD, Take 237 mLs by mouth 2 (two) times daily between meals., Disp: 60 Bottle, Rfl: 2 gabapentin (NEURONTIN) 100 MG capsule, , Disp: , Rfl: ;  hydrochlorothiazide (HYDRODIURIL) 25 MG tablet, Take 1 tablet (25 mg total) by mouth daily., Disp: 90 tablet, Rfl: 3;  isosorbide mononitrate (IMDUR) 60 MG 24 hr tablet, Take 1 tablet (60 mg total) by mouth daily., Disp: 90 tablet, Rfl: 3 lidocaine-prilocaine (EMLA) cream, Apply topically as needed. Prior to chemotherapy treatment, Apply approx 1/2 teaspoon to skin over port, do not rub in. Cover with plastic,, Disp: 30 g, Rfl: 1;  loratadine (CLARITIN) 10 MG tablet, Take 10 mg by mouth daily.  , Disp: , Rfl: ;  metoprolol succinate (TOPROL-XL) 50 MG 24 hr tablet, Take 50 mg by mouth daily. Take with or immediately following a meal., Disp: , Rfl:  Multiple Vitamin (MULTIVITAMIN) tablet, Take 1 tablet by mouth daily.  , Disp: , Rfl: ;  nitroGLYCERIN (NITROSTAT) 0.4 MG SL tablet, Place 0.4 mg under the tongue every 5 (five) minutes as needed.  , Disp: , Rfl: ;  nystatin cream (MYCOSTATIN), Apply topically 2 (two) times daily., Disp: 30 g, Rfl: 2;  Polyethyl Glycol-Propyl Glycol (SYSTANE OP), Apply 1 drop to eye daily. 1 or 2 drops in the affected eye as needed, Disp: , Rfl:  prochlorperazine (COMPAZINE) 10 MG tablet, Take 1 tablet (10 mg total) by mouth every 6 (six) hours as needed., Disp:  30 tablet, Rfl: 1;  simvastatin (ZOCOR) 40 MG tablet, Take 1 tablet (40 mg total) by mouth at bedtime., Disp: 90 tablet, Rfl: 3;  zoster vaccine live, PF, (ZOSTAVAX) 45409 UNT/0.65ML injection, Inject into the skin once. Dispense to patient to be admin in doctors office , Disp: , Rfl:   Allergies: No Known Allergies  Past Medical History, Surgical history, Social history, and Family History were reviewed and updated.  Review of Systems: Constitutional:  Negative for fever, chills, night sweats, anorexia, weight loss,  pain. Cardiovascular: no chest pain or dyspnea on exertion Respiratory: negative Neurological: negative Dermatological: negative ENT: negative Skin: Negative. Gastrointestinal: negative Genito-Urinary: negative Hematological and Lymphatic:  Breast: negative Musculoskeletal: negative Remaining ROS negative.  Physical Exam: Blood pressure 144/71, pulse 57, temperature 97 F (36.1 C), temperature source Oral, resp. rate 18, height 5\' 9"  (1.753 m), weight 208 lb 14.4 oz (94.756 kg). ECOG: 1 General appearance: alert Head: Normocephalic, without obvious abnormality, atraumatic Neck: no adenopathy, no carotid bruit, no JVD, supple, symmetrical, trachea midline and thyroid not enlarged, symmetric, no tenderness/mass/nodules Lymph nodes: Cervical, supraclavicular, and axillary nodes normal. Heart:regular rate and rhythm, S1, S2 normal, no murmur, click, rub or gallop Lung:chest clear, no wheezing, rales, normal symmetric air entry Abdomen: soft, non-tender, without masses or organomegaly EXT:no erythema, induration, or nodules Skin: Red excoriated area to lower abdomen. Skin is macerated under dressing.   Lab Results: Lab Results  Component Value Date   WBC 10.7* 11/29/2012   HGB 12.3* 11/29/2012   HCT 35.5* 11/29/2012   MCV 85.7 11/29/2012   PLT 137* 11/29/2012     Chemistry      Component Value Date/Time   NA 137 11/15/2012 0801   NA 135 09/13/2012 0510   K 3.8 11/15/2012 0801   K 3.6 09/13/2012 0510   CL 104 11/15/2012 0801   CL 100 09/13/2012 0510   CO2 25 11/15/2012 0801   CO2 28 09/13/2012 0510   BUN 14.4 11/15/2012 0801   BUN 16 09/13/2012 0510   CREATININE 0.8 11/15/2012 0801   CREATININE 0.70 09/13/2012 0510   CREATININE 0.76 06/27/2012 0856      Component Value Date/Time   CALCIUM 8.7 11/15/2012 0801   CALCIUM 8.8 09/13/2012 0510   ALKPHOS 52 11/15/2012 0801   ALKPHOS 173* 09/04/2012 2215   AST 17 11/15/2012 0801   AST 39* 09/04/2012 2215   ALT 9 11/15/2012 0801   ALT 28 09/04/2012  2215   BILITOT 0.64 11/15/2012 0801   BILITOT 0.4 09/04/2012 2215      Impression and Plan:  79 year old man with :  1. Stage IV colon cancer. He is S/P sigmoid colon resection with a bulky tumor in the liver. On palliative chemotherapy with FOLFIRI/Avastin. Here for cycle 4. Recommend that he proceed without dose modification. He will get a CT scan after cycle 4.   2. Port management. Port in place, no flush will be needed.   3. Candidiasis to lower abdomen. I have prescribed Nystatin cream BID. I have asked him to stop using Neosporin and to leave area open.  4. Follow up: in 2 weeks for cycle 5.    [place], [place] 5/7/201410:26 AM

## 2012-11-29 NOTE — Patient Instructions (Signed)
[facility] Discharge Instructions for Patients Receiving Chemotherapy  Today you received the following chemotherapy agents Camptosar,leucovorin,[id number] To help prevent nausea and vomiting after your treatment, we encourage you to take your nausea medication   Take it as often as prescribed.   If you develop nausea and vomiting that is not controlled by your nausea medication, call the clinic. If it is after clinic hours your family physician or the after hours number for the clinic or go to the Emergency Department.   BELOW ARE SYMPTOMS THAT SHOULD BE REPORTED IMMEDIATELY:  *FEVER GREATER THAN 100.5 F  *CHILLS WITH OR WITHOUT FEVER  NAUSEA AND VOMITING THAT IS NOT CONTROLLED WITH YOUR NAUSEA MEDICATION  *UNUSUAL SHORTNESS OF BREATH  *UNUSUAL BRUISING OR BLEEDING  TENDERNESS IN MOUTH AND THROAT WITH OR WITHOUT PRESENCE OF ULCERS  *URINARY PROBLEMS  *BOWEL PROBLEMS  UNUSUAL RASH Items with * indicate a potential emergency and should be followed up as soon as possible.  If this is your first treatment one of the nurses will contact you 24 hours after your treatment. Please let the nurse know about any problems that you may have experienced. Feel free to call the clinic you have any questions or concerns. The clinic phone number is [phone].   I have been informed and understand all the instructions given to me. I know to contact the clinic, my physician, or go to the Emergency Department if any problems should occur. I do not have any questions at this time, but understand that I may call the clinic during office hours   should I have any questions or need assistance in obtaining follow up care.    __________________________________________  _____________  __________ Signature of Patient or Authorized Representative            Date                   Time    __________________________________________ Nurse's Signature

## 2012-11-29 NOTE — Telephone Encounter (Signed)
Pt is calling about his test for chemo - would like to speak to Dr Mauricio Po - needs his opinion

## 2012-11-30 NOTE — Telephone Encounter (Signed)
Called patient back.  He would like to talk with me after his upcoming CT on 5/19.  He will call me after he has the CT done, so that I may call him back.  JB

## 2012-12-01 ENCOUNTER — Ambulatory Visit (HOSPITAL_BASED_OUTPATIENT_CLINIC_OR_DEPARTMENT_OTHER): Payer: Medicare Other

## 2012-12-01 VITALS — BP 115/57 | HR 59 | Temp 96.9°F

## 2012-12-01 DIAGNOSIS — C187 Malignant neoplasm of sigmoid colon: Secondary | ICD-10-CM

## 2012-12-01 DIAGNOSIS — C787 Secondary malignant neoplasm of liver and intrahepatic bile duct: Secondary | ICD-10-CM

## 2012-12-01 DIAGNOSIS — C189 Malignant neoplasm of colon, unspecified: Secondary | ICD-10-CM

## 2012-12-01 MED ORDER — SODIUM CHLORIDE 0.9 % IJ SOLN
10.0000 mL | INTRAMUSCULAR | Status: DC | PRN
  Administered 2012-12-01: 10 mL
  Filled 2012-12-01: qty 10

## 2012-12-01 MED ORDER — HEPARIN SOD (PORK) LOCK FLUSH 100 UNIT/ML IV SOLN
500.0000 [IU] | Freq: Once | INTRAVENOUS | Status: AC | PRN
  Administered 2012-12-01: 500 [IU]
  Filled 2012-12-01: qty 5

## 2012-12-11 ENCOUNTER — Telehealth: Payer: Self-pay | Admitting: Family Medicine

## 2012-12-11 ENCOUNTER — Other Ambulatory Visit: Payer: Self-pay | Admitting: Family Medicine

## 2012-12-11 ENCOUNTER — Ambulatory Visit (HOSPITAL_COMMUNITY)
Admission: RE | Admit: 2012-12-11 | Discharge: 2012-12-11 | Disposition: A | Discharge status: Home / Self Care | Payer: Medicare Other | Source: Ambulatory Visit | Attending: Oncology | Admitting: Oncology

## 2012-12-11 ENCOUNTER — Encounter (HOSPITAL_COMMUNITY): Payer: Self-pay

## 2012-12-11 DIAGNOSIS — N400 Enlarged prostate without lower urinary tract symptoms: Secondary | ICD-10-CM | POA: Insufficient documentation

## 2012-12-11 DIAGNOSIS — C189 Malignant neoplasm of colon, unspecified: Secondary | ICD-10-CM | POA: Insufficient documentation

## 2012-12-11 DIAGNOSIS — I700 Atherosclerosis of aorta: Secondary | ICD-10-CM | POA: Insufficient documentation

## 2012-12-11 DIAGNOSIS — C787 Secondary malignant neoplasm of liver and intrahepatic bile duct: Secondary | ICD-10-CM | POA: Insufficient documentation

## 2012-12-11 MED ORDER — IOHEXOL 300 MG/ML  SOLN
100.0000 mL | Freq: Once | INTRAMUSCULAR | Status: AC | PRN
  Administered 2012-12-11: 100 mL via INTRAVENOUS

## 2012-12-11 NOTE — Telephone Encounter (Signed)
I called patient back.  The results of the CT are not back yet.  He will discuss with Dr Clelia Croft at his appointment on Weds (May 21) morning. JB

## 2012-12-11 NOTE — Telephone Encounter (Signed)
I called patient to discuss radiology report of CT abdomen/pelvis.  He has appointment with Oncology on May 21 to discuss implications for further treatment. Paula Compton, MD

## 2012-12-11 NOTE — Telephone Encounter (Signed)
Patient is calling about his CT Scan.

## 2012-12-11 NOTE — Telephone Encounter (Signed)
Spoke with patient who is very anxious to receive the results of his CT scan done today.  Pt. States Dr. Mauricio Po will be out of town tomorrow so if he can call him today, patient would appreciate  that.  Emilie Rutter, Darlyne Russian

## 2012-12-13 ENCOUNTER — Other Ambulatory Visit (HOSPITAL_BASED_OUTPATIENT_CLINIC_OR_DEPARTMENT_OTHER): Payer: Medicare Other | Admitting: Lab

## 2012-12-13 ENCOUNTER — Telehealth: Payer: Self-pay | Admitting: Oncology

## 2012-12-13 ENCOUNTER — Ambulatory Visit (HOSPITAL_BASED_OUTPATIENT_CLINIC_OR_DEPARTMENT_OTHER): Payer: Medicare Other

## 2012-12-13 ENCOUNTER — Ambulatory Visit (HOSPITAL_BASED_OUTPATIENT_CLINIC_OR_DEPARTMENT_OTHER): Payer: Medicare Other | Admitting: Oncology

## 2012-12-13 ENCOUNTER — Telehealth: Payer: Self-pay | Admitting: *Deleted

## 2012-12-13 VITALS — BP 120/56 | HR 93 | Temp 97.9°F | Resp 18

## 2012-12-13 VITALS — BP 127/69 | HR 62 | Temp 97.0°F | Resp 19 | Ht 69.0 in | Wt 210.9 lb

## 2012-12-13 DIAGNOSIS — B379 Candidiasis, unspecified: Secondary | ICD-10-CM

## 2012-12-13 DIAGNOSIS — C187 Malignant neoplasm of sigmoid colon: Secondary | ICD-10-CM

## 2012-12-13 DIAGNOSIS — C787 Secondary malignant neoplasm of liver and intrahepatic bile duct: Secondary | ICD-10-CM

## 2012-12-13 DIAGNOSIS — C189 Malignant neoplasm of colon, unspecified: Secondary | ICD-10-CM

## 2012-12-13 DIAGNOSIS — R040 Epistaxis: Secondary | ICD-10-CM

## 2012-12-13 DIAGNOSIS — Z5111 Encounter for antineoplastic chemotherapy: Secondary | ICD-10-CM

## 2012-12-13 LAB — CBC WITH DIFFERENTIAL/PLATELET
BASO%: 0.6 % (ref 0.0–2.0)
EOS%: 5.4 % (ref 0.0–7.0)
HCT: 33.3 % — ABNORMAL LOW (ref 38.4–49.9)
LYMPH%: 23.3 % (ref 14.0–49.0)
MCH: 29.6 pg (ref 27.2–33.4)
MCHC: 33.9 g/dL (ref 32.0–36.0)
NEUT%: 61.9 % (ref 39.0–75.0)
RBC: 3.82 10*6/uL — ABNORMAL LOW (ref 4.20–5.82)
lymph#: 1.5 10*3/uL (ref 0.9–3.3)

## 2012-12-13 LAB — COMPREHENSIVE METABOLIC PANEL (CC13)
ALT: 11 U/L (ref 0–55)
AST: 18 U/L (ref 5–34)
Chloride: 105 mEq/L (ref 98–107)
Creatinine: 0.9 mg/dL (ref 0.7–1.3)
Sodium: 139 mEq/L (ref 136–145)
Total Bilirubin: 0.67 mg/dL (ref 0.20–1.20)
Total Protein: 6.4 g/dL (ref 6.4–8.3)

## 2012-12-13 MED ORDER — IRINOTECAN HCL CHEMO INJECTION 100 MG/5ML
125.0000 mg/m2 | Freq: Once | INTRAVENOUS | Status: AC
  Administered 2012-12-13: 270 mg via INTRAVENOUS
  Filled 2012-12-13: qty 13.5

## 2012-12-13 MED ORDER — SODIUM CHLORIDE 0.9 % IV SOLN
Freq: Once | INTRAVENOUS | Status: AC
  Administered 2012-12-13: 09:00:00 via INTRAVENOUS

## 2012-12-13 MED ORDER — ONDANSETRON 16 MG/50ML IVPB (CHCC)
16.0000 mg | Freq: Once | INTRAVENOUS | Status: AC
  Administered 2012-12-13: 16 mg via INTRAVENOUS

## 2012-12-13 MED ORDER — ATROPINE SULFATE 1 MG/ML IJ SOLN
0.5000 mg | Freq: Once | INTRAMUSCULAR | Status: AC | PRN
  Administered 2012-12-13: 0.5 mg via INTRAVENOUS

## 2012-12-13 MED ORDER — FLUOROURACIL CHEMO INJECTION 2.5 GM/50ML
300.0000 mg/m2 | Freq: Once | INTRAVENOUS | Status: AC
  Administered 2012-12-13: 650 mg via INTRAVENOUS
  Filled 2012-12-13: qty 13

## 2012-12-13 MED ORDER — FLUOROURACIL CHEMO INJECTION 5 GM/100ML
1800.0000 mg/m2 | INTRAVENOUS | Status: DC
  Administered 2012-12-13: 3900 mg via INTRAVENOUS
  Filled 2012-12-13: qty 78

## 2012-12-13 MED ORDER — DEXAMETHASONE SODIUM PHOSPHATE 20 MG/5ML IJ SOLN
20.0000 mg | Freq: Once | INTRAMUSCULAR | Status: AC
  Administered 2012-12-13: 20 mg via INTRAVENOUS

## 2012-12-13 MED ORDER — LEUCOVORIN CALCIUM INJECTION 350 MG
301.0000 mg/m2 | Freq: Once | INTRAVENOUS | Status: AC
  Administered 2012-12-13: 650 mg via INTRAVENOUS
  Filled 2012-12-13: qty 32.5

## 2012-12-13 NOTE — Telephone Encounter (Signed)
gv and printed appt sched and avs to pt...emailed MW to add tx

## 2012-12-13 NOTE — Patient Instructions (Signed)
Patient aware of next appointment; discharged home with no complaints. 

## 2012-12-13 NOTE — Progress Notes (Signed)
Hematology and Oncology Follow Up Visit  Todd [name] [id number] March 06, 1930 79 y.o. 12/13/2012 8:35 AM Todd [name], MDBreen, Todd Lamas, MD   Principle Diagnosis: 79 year old with stage IV colon cancer diagnosed in 08/2012. He presented with a colonic mass and liver lesions.  KRAS wild type.   Prior Therapy: He is S/P LAPAROSCOPIC ASSISTED SIGMOID COLON RESECTION, END Left OSTOMY, Hartmann's pouch and acement of power port done on 09/07/2012.   Current therapy:  He started FOLFIRI and Avastin on 3/26. Here for cycle 5 today.  Interim History: Todd [name] presents today for a follow up visit. He is a very nice man with stage IV colon cancer. He under went surgical resection on 09/07/2012 and has been doing well chemotherapy in general.He Received first 4 cycles of chemotherapy with little complications. Had mild diarrhea for about 3 days and took Imodium. No longer having issues with constipation. No chest pain, shortness of breath, abdominal pain, nausea, or vomiting. Appetite and weight stable.  Has a rash to his abdomen where his pants rub against his skin. He has been putting Neosporin on this and covering with a dressing which has improved at this time. Overall, continues to have good quality of life. He has reported more epistaxis at this time. He is noticing the bleeding more persistent and lasing longer than before.    Medications: I have reviewed the patient's current medications.  Current Outpatient Prescriptions  Medication Sig Dispense Refill  . aspirin 81 MG tablet Take 81 mg by mouth daily.        . benazepril (LOTENSIN) 10 MG tablet Take 1 tablet (10 mg total) by mouth daily.  90 tablet  3  . chlorhexidine (PERIDEX) 0.12 % solution Use as directed 15 mLs in the mouth or throat 2 (two) times daily. Use as directed for mouth irritation      . colchicine 0.6 MG tablet Take 0.6 mg by mouth daily as needed (for gout flares).      . diclofenac (VOLTAREN) 75 MG EC tablet Take 1 tablet (75  mg total) by mouth 2 (two) times daily as needed.  180 tablet  0  . doxazosin (CARDURA) 8 MG tablet Take 8 mg by mouth daily.       . feeding supplement (ENSURE COMPLETE) LIQD Take 237 mLs by mouth 2 (two) times daily between meals.  60 Bottle  2  . gabapentin (NEURONTIN) 100 MG capsule       . hydrochlorothiazide (HYDRODIURIL) 25 MG tablet Take 1 tablet (25 mg total) by mouth daily.  90 tablet  3  . isosorbide mononitrate (IMDUR) 60 MG 24 hr tablet Take 1 tablet (60 mg total) by mouth daily.  90 tablet  3  . lidocaine-prilocaine (EMLA) cream Apply topically as needed. Prior to chemotherapy treatment, Apply approx 1/2 teaspoon to skin over port, do not rub in. Cover with plastic,  30 g  1  . loratadine (CLARITIN) 10 MG tablet Take 10 mg by mouth daily.        . metoprolol succinate (TOPROL-XL) 50 MG 24 hr tablet Take 50 mg by mouth daily. Take with or immediately following a meal.      . Multiple Vitamin (MULTIVITAMIN) tablet Take 1 tablet by mouth daily.        . nitroGLYCERIN (NITROSTAT) 0.4 MG SL tablet Place 0.4 mg under the tongue every 5 (five) minutes as needed.        . nystatin cream (MYCOSTATIN) Apply topically 2 (two) times  daily.  30 g  2  . Polyethyl Glycol-Propyl Glycol (SYSTANE OP) Apply 1 drop to eye daily. 1 or 2 drops in the affected eye as needed      . prochlorperazine (COMPAZINE) 10 MG tablet Take 1 tablet (10 mg total) by mouth every 6 (six) hours as needed.  30 tablet  1  . simvastatin (ZOCOR) 40 MG tablet Take 1 tablet (40 mg total) by mouth at bedtime.  90 tablet  3  . zoster vaccine live, PF, (ZOSTAVAX) 96045 UNT/0.65ML injection Inject into the skin once. Dispense to patient to be admin in doctors office        No current facility-administered medications for this visit.    Allergies: No Known Allergies  Past Medical History, Surgical history, Social history, and Family History were reviewed and updated.  Review of Systems: Constitutional:  Negative for fever,  chills, night sweats, anorexia, weight loss, pain. Cardiovascular: no chest pain or dyspnea on exertion Respiratory: negative Neurological: negative Dermatological: negative ENT: negative Skin: Negative. Gastrointestinal: negative Genito-Urinary: negative Hematological and Lymphatic:  Breast: negative Musculoskeletal: negative Remaining ROS negative.  Physical Exam: Blood pressure 127/69, pulse 62, temperature 97 F (36.1 C), temperature source Oral, resp. rate 19, height 5\' 9"  (1.753 m), weight 210 lb 14.4 oz (95.664 kg). ECOG: 1 General appearance: alert Head: Normocephalic, without obvious abnormality, atraumatic Neck: no adenopathy, no carotid bruit, no JVD, supple, symmetrical, trachea midline and thyroid not enlarged, symmetric, no tenderness/mass/nodules Lymph nodes: Cervical, supraclavicular, and axillary nodes normal. Heart:regular rate and rhythm, S1, S2 normal, no murmur, click, rub or gallop Lung:chest clear, no wheezing, rales, normal symmetric air entry Abdomen: soft, non-tender, without masses or organomegaly EXT:no erythema, induration, or nodules Skin: Red excoriated area to lower abdomen. Skin is macerated under dressing.   Lab Results: Lab Results  Component Value Date   WBC 6.5 12/13/2012   HGB 11.3* 12/13/2012   HCT 33.3* 12/13/2012   MCV 87.2 12/13/2012   PLT 131* 12/13/2012     Chemistry      Component Value Date/Time   NA 139 11/29/2012 0913   NA 135 09/13/2012 0510   K 3.8 11/29/2012 0913   K 3.6 09/13/2012 0510   CL 105 11/29/2012 0913   CL 100 09/13/2012 0510   CO2 25 11/29/2012 0913   CO2 28 09/13/2012 0510   BUN 19.1 11/29/2012 0913   BUN 16 09/13/2012 0510   CREATININE 0.8 11/29/2012 0913   CREATININE 0.70 09/13/2012 0510   CREATININE 0.76 06/27/2012 0856      Component Value Date/Time   CALCIUM 8.8 11/29/2012 0913   CALCIUM 8.8 09/13/2012 0510   ALKPHOS 45 11/29/2012 0913   ALKPHOS 173* 09/04/2012 2215   AST 17 11/29/2012 0913   AST 39* 09/04/2012 2215    ALT 11 11/29/2012 0913   ALT 28 09/04/2012 2215   BILITOT 0.91 11/29/2012 0913   BILITOT 0.4 09/04/2012 2215      CT scan 12/11/2012  IMPRESSION:  1. Positive response to therapy with slight decrease in size of  numerous hepatic metastases, as above.  2. No new sites of metastatic disease are confidently identified  in the abdomen or pelvis.  3. Low attenuation right adrenal nodule is unchanged compatible  with an adenoma.  4. Prostatomegaly.  5. Additional incidental findings, as above.  Impression and Plan:  79 year old man with :  1. Stage IV colon cancer. He is S/P sigmoid colon resection with a bulky tumor in the liver.  On palliative chemotherapy with FOLFIRI/Avastin. He is S/P cycle 4 two weeks ago. CT scan results from 5/19 was discussed and showed about 25% reduction of his tumor burden. Based on that, I recommend that he proceed without dose modification for cycle 6 and 7. We will repeat CT scan after cycle 8.   2. Port management. Port in place, no flush will be needed.   3. Candidiasis to lower abdomen.Improved now.  5. Epistaxis: Likely Avastin related. I will stop it all together. I think it adds very little benefit and now with more complications.   4. Follow up: in 2 weeks for cycle 6.    Mahmoud Blazejewski 5/21/20148:35 AM

## 2012-12-13 NOTE — Telephone Encounter (Signed)
Per staff message and POF I have scheduled appts.  JMW  

## 2012-12-15 ENCOUNTER — Ambulatory Visit (HOSPITAL_BASED_OUTPATIENT_CLINIC_OR_DEPARTMENT_OTHER): Payer: Medicare Other

## 2012-12-15 DIAGNOSIS — C189 Malignant neoplasm of colon, unspecified: Secondary | ICD-10-CM

## 2012-12-15 DIAGNOSIS — C787 Secondary malignant neoplasm of liver and intrahepatic bile duct: Secondary | ICD-10-CM

## 2012-12-15 MED ORDER — HEPARIN SOD (PORK) LOCK FLUSH 100 UNIT/ML IV SOLN
500.0000 [IU] | Freq: Once | INTRAVENOUS | Status: AC | PRN
  Administered 2012-12-15: 500 [IU]
  Filled 2012-12-15: qty 5

## 2012-12-15 MED ORDER — SODIUM CHLORIDE 0.9 % IJ SOLN
10.0000 mL | INTRAMUSCULAR | Status: DC | PRN
  Administered 2012-12-15: 10 mL
  Filled 2012-12-15: qty 10

## 2012-12-15 MED ORDER — HEPARIN SOD (PORK) LOCK FLUSH 100 UNIT/ML IV SOLN
250.0000 [IU] | Freq: Once | INTRAVENOUS | Status: DC | PRN
  Filled 2012-12-15: qty 5

## 2012-12-15 NOTE — Patient Instructions (Signed)
Patient aware of next appointment; discharged home with no complaints. 

## 2012-12-22 ENCOUNTER — Encounter: Payer: Self-pay | Admitting: Family Medicine

## 2012-12-22 ENCOUNTER — Ambulatory Visit (HOSPITAL_BASED_OUTPATIENT_CLINIC_OR_DEPARTMENT_OTHER)
Admission: RE | Admit: 2012-12-22 | Discharge: 2012-12-22 | Disposition: A | Discharge status: Home / Self Care | Payer: Medicare Other | Source: Ambulatory Visit | Attending: Family Medicine | Admitting: Family Medicine

## 2012-12-22 ENCOUNTER — Ambulatory Visit (HOSPITAL_BASED_OUTPATIENT_CLINIC_OR_DEPARTMENT_OTHER): Payer: Medicare Other | Admitting: Family Medicine

## 2012-12-22 VITALS — BP 144/78 | HR 62 | Ht 70.0 in | Wt 198.0 lb

## 2012-12-22 DIAGNOSIS — M25561 Pain in right knee: Secondary | ICD-10-CM

## 2012-12-22 DIAGNOSIS — M25569 Pain in unspecified knee: Secondary | ICD-10-CM | POA: Insufficient documentation

## 2012-12-22 DIAGNOSIS — M1710 Unilateral primary osteoarthritis, unspecified knee: Secondary | ICD-10-CM | POA: Insufficient documentation

## 2012-12-22 DIAGNOSIS — IMO0002 Reserved for concepts with insufficient information to code with codable children: Secondary | ICD-10-CM | POA: Insufficient documentation

## 2012-12-22 NOTE — Patient Instructions (Addendum)
Your x-rays were negative for a fracture. This is a patellar contusion, traumatic prepatellar bursitis. These typically last about 2-3 weeks but can cause pain up to 6 weeks. Ice area 15 minutes at a time at least 3-4 times a day. Stay as active as possible. Elevate, use ace wrap for compression/swelling. Tylenol and/or aleve as needed for pain. Follow up with me as needed.

## 2012-12-25 ENCOUNTER — Encounter: Payer: Self-pay | Admitting: Family Medicine

## 2012-12-25 DIAGNOSIS — M25561 Pain in right knee: Secondary | ICD-10-CM | POA: Insufficient documentation

## 2012-12-25 NOTE — Progress Notes (Signed)
Patient ID: Todd [name], male   DOB: [birth year], 79 y.o.   MRN: [id number]  PCP: [name], MD  Subjective:   HPI: Patient is a 79 y.o. male here for right knee injury.  Patient reports on 5/27 he woke up in the morning and took a gabapentin. States he bent down to pick something up and fell forward directly onto right knee. Seemed to do ok over the next day - some pain in front of kneecap. Then by 5/29 had a lot of swelling in front of kneecap, some difficulty walking. Has been icing which helped a lot. Has not had any workup to date. No prior issues with right knee.  Past Medical History  Diagnosis Date  . Myocardial infarction 1990    "Massive"  . Gout   . Hypertension   . Arthritis, rheumatoid   . Diabetes mellitus without complication   . CHF (congestive heart failure)   . BPH (benign prostatic hyperplasia)   . Neuropathy   . Hyperlipidemia     Current Outpatient Prescriptions on File Prior to Visit  Medication Sig Dispense Refill  . aspirin 81 MG tablet Take 81 mg by mouth daily.        . benazepril (LOTENSIN) 10 MG tablet Take 1 tablet (10 mg total) by mouth daily.  90 tablet  3  . chlorhexidine (PERIDEX) 0.12 % solution Use as directed 15 mLs in the mouth or throat 2 (two) times daily. Use as directed for mouth irritation      . colchicine 0.6 MG tablet Take 0.6 mg by mouth daily as needed (for gout flares).      . diclofenac (VOLTAREN) 75 MG EC tablet Take 1 tablet (75 mg total) by mouth 2 (two) times daily as needed.  180 tablet  0  . doxazosin (CARDURA) 8 MG tablet Take 8 mg by mouth daily.       . feeding supplement (ENSURE COMPLETE) LIQD Take 237 mLs by mouth 2 (two) times daily between meals.  60 Bottle  2  . gabapentin (NEURONTIN) 100 MG capsule       . hydrochlorothiazide (HYDRODIURIL) 25 MG tablet Take 1 tablet (25 mg total) by mouth daily.  90 tablet  3  . isosorbide mononitrate (IMDUR) 60 MG 24 hr tablet Take 1 tablet (60 mg total) by mouth daily.  90  tablet  3  . lidocaine-prilocaine (EMLA) cream Apply topically as needed. Prior to chemotherapy treatment, Apply approx 1/2 teaspoon to skin over port, do not rub in. Cover with plastic,  30 g  1  . loratadine (CLARITIN) 10 MG tablet Take 10 mg by mouth daily.        . metoprolol succinate (TOPROL-XL) 50 MG 24 hr tablet Take 50 mg by mouth daily. Take with or immediately following a meal.      . Multiple Vitamin (MULTIVITAMIN) tablet Take 1 tablet by mouth daily.        . nitroGLYCERIN (NITROSTAT) 0.4 MG SL tablet Place 0.4 mg under the tongue every 5 (five) minutes as needed.        . nystatin cream (MYCOSTATIN) Apply topically 2 (two) times daily.  30 g  2  . Polyethyl Glycol-Propyl Glycol (SYSTANE OP) Apply 1 drop to eye daily. 1 or 2 drops in the affected eye as needed      . prochlorperazine (COMPAZINE) 10 MG tablet Take 1 tablet (10 mg total) by mouth every 6 (six) hours as needed.  30 tablet  1  .  simvastatin (ZOCOR) 40 MG tablet Take 1 tablet (40 mg total) by mouth at bedtime.  90 tablet  3  . zoster vaccine live, PF, (ZOSTAVAX) 16109 UNT/0.65ML injection Inject into the skin once. Dispense to patient to be admin in doctors office        No current facility-administered medications on file prior to visit.    Past Surgical History  Procedure Laterality Date  . Cataract extraction    . Flexible sigmoidoscopy N/A 09/05/2012    Procedure: FLEXIBLE SIGMOIDOSCOPY;  Surgeon: Barrie Folk, MD;  Location: [facility] ENDOSCOPY;  Service: Endoscopy;  Laterality: N/A;  Give 1000 cc tapwater enema on call to endoscopy.  . Colon resection N/A 09/07/2012    Procedure: LAPAROSCOPIC ASSISTED SIGMOID COLON RESECTION END OSTOMY;  Surgeon: Kandis Cocking, MD;  Location: [facility] OR;  Service: General;  Laterality: N/A;  . Portacath placement N/A 09/07/2012    Procedure: Placement of power port;  Surgeon: Kandis Cocking, MD;  Location[facility] OR;  Service: General;  Laterality: N/A;    No Known Allergies  History    Social History  . Marital Status: Married    Spouse Name: N/A    Number of Children: 2  . Years of Education: N/A   Occupational History  . Retired, food and nutritional services    Social History Main Topics  . Smoking status: Former Games developer  . Smokeless tobacco: Never Used     Comment: Quit 45 years ago.  . Alcohol Use: Yes     Comment: 1 drink a month  . Drug Use: No  . Sexually Active: Not Currently   Other Topics Concern  . Not on file   Social History Narrative   Emergency Contact:wife, Delvonte Berenson at [phone]   Health Care POA:   Who lives with you: Lives with wife in 1 story home.   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  Has a varied diet and is currently trying to lose weight by decreasing protein and carbohydrates daily.   Exercise: water aerobix 3x a week for 45 mins and uses row bike 2 times a week for 20 minutes   Seatbelts: wears seatbelt while in vehicles   Sun Exposure/Protection: Patient reports wearing sun screen   Hobbies: numbers, cruises             Family History  Problem Relation Age of Onset  . Stroke Father   . Diabetes Father   . Hypertension Father   . Heart attack Father   . Hyperlipidemia Neg Hx   . Sudden death Neg Hx     BP 144/78  Pulse 62  Ht 5\' 10"  (1.778 m)  Wt 198 lb (89.812 kg)  BMI 28.41 kg/m2  Review of Systems: See HPI above.    Objective:  Physical Exam:  Gen: NAD  R knee: No gross deformity, ecchymoses, swelling. TTP over patella.  No joint line, post patellar facet, quad/patellar tendon TTP. FROM. Negative ant/post drawers. Negative valgus/varus testing. Negative lachmanns. Negative mcmurrays, apleys, patellar apprehension, clarkes. NV intact distally.    Assessment & Plan:  1. Right knee pain - radiographs negative for fracture.  2/2 contusion.  Usually about 2-3 weeks of pain.  Icing, tylenol as needed for pain.  Elevation, ace wrap.   F/u prn.

## 2012-12-25 NOTE — Assessment & Plan Note (Signed)
radiographs negative for fracture.  2/2 contusion.  Usually about 2-3 weeks of pain.  Icing, tylenol as needed for pain.  Elevation, ace wrap.  F/u prn.

## 2012-12-25 NOTE — Progress Notes (Deleted)
  Subjective:    Patient ID: Todd [name], male    DOB: [birth year], 79 y.o.   MRN: [id number]  HPI    Review of Systems     Objective:   Physical Exam        Assessment & Plan:

## 2012-12-27 ENCOUNTER — Ambulatory Visit (HOSPITAL_BASED_OUTPATIENT_CLINIC_OR_DEPARTMENT_OTHER): Payer: Medicare Other

## 2012-12-27 ENCOUNTER — Encounter: Payer: Self-pay | Admitting: Oncology

## 2012-12-27 ENCOUNTER — Ambulatory Visit (HOSPITAL_BASED_OUTPATIENT_CLINIC_OR_DEPARTMENT_OTHER): Payer: Medicare Other | Admitting: Oncology

## 2012-12-27 ENCOUNTER — Other Ambulatory Visit (HOSPITAL_BASED_OUTPATIENT_CLINIC_OR_DEPARTMENT_OTHER): Payer: Medicare Other | Admitting: Lab

## 2012-12-27 VITALS — BP 121/60 | HR 57 | Temp 97.0°F | Resp 18 | Ht 70.0 in | Wt 205.0 lb

## 2012-12-27 DIAGNOSIS — Z5111 Encounter for antineoplastic chemotherapy: Secondary | ICD-10-CM

## 2012-12-27 DIAGNOSIS — C187 Malignant neoplasm of sigmoid colon: Secondary | ICD-10-CM

## 2012-12-27 DIAGNOSIS — C189 Malignant neoplasm of colon, unspecified: Secondary | ICD-10-CM

## 2012-12-27 DIAGNOSIS — R197 Diarrhea, unspecified: Secondary | ICD-10-CM

## 2012-12-27 DIAGNOSIS — R040 Epistaxis: Secondary | ICD-10-CM

## 2012-12-27 DIAGNOSIS — C787 Secondary malignant neoplasm of liver and intrahepatic bile duct: Secondary | ICD-10-CM

## 2012-12-27 LAB — CBC WITH DIFFERENTIAL/PLATELET
Basophils Absolute: 0 10*3/uL (ref 0.0–0.1)
EOS%: 2.8 % (ref 0.0–7.0)
HCT: 31.8 % — ABNORMAL LOW (ref 38.4–49.9)
HGB: 10.9 g/dL — ABNORMAL LOW (ref 13.0–17.1)
LYMPH%: 15.4 % (ref 14.0–49.0)
MCH: 29.9 pg (ref 27.2–33.4)
MCV: 87.1 fL (ref 79.3–98.0)
MONO%: 7.3 % (ref 0.0–14.0)
NEUT%: 74.1 % (ref 39.0–75.0)

## 2012-12-27 LAB — COMPREHENSIVE METABOLIC PANEL (CC13)
AST: 19 U/L (ref 5–34)
Alkaline Phosphatase: 47 U/L (ref 40–150)
BUN: 13.9 mg/dL (ref 7.0–26.0)
Calcium: 8.9 mg/dL (ref 8.4–10.4)
Creatinine: 0.9 mg/dL (ref 0.7–1.3)
Total Bilirubin: 0.97 mg/dL (ref 0.20–1.20)

## 2012-12-27 MED ORDER — ONDANSETRON 16 MG/50ML IVPB (CHCC)
16.0000 mg | Freq: Once | INTRAVENOUS | Status: AC
  Administered 2012-12-27: 16 mg via INTRAVENOUS

## 2012-12-27 MED ORDER — ATROPINE SULFATE 1 MG/ML IJ SOLN
0.5000 mg | Freq: Once | INTRAMUSCULAR | Status: AC | PRN
  Administered 2012-12-27: 0.5 mg via INTRAVENOUS

## 2012-12-27 MED ORDER — SODIUM CHLORIDE 0.9 % IV SOLN
Freq: Once | INTRAVENOUS | Status: AC
  Administered 2012-12-27: 11:00:00 via INTRAVENOUS

## 2012-12-27 MED ORDER — DIPHENOXYLATE-ATROPINE 2.5-0.025 MG PO TABS: 2.0000 | ORAL_TABLET | Freq: Four times a day (QID) | ORAL | Status: DC | PRN

## 2012-12-27 MED ORDER — FLUOROURACIL CHEMO INJECTION 2.5 GM/50ML
300.0000 mg/m2 | Freq: Once | INTRAVENOUS | Status: AC
  Administered 2012-12-27: 650 mg via INTRAVENOUS
  Filled 2012-12-27: qty 13

## 2012-12-27 MED ORDER — DEXAMETHASONE SODIUM PHOSPHATE 20 MG/5ML IJ SOLN
20.0000 mg | Freq: Once | INTRAMUSCULAR | Status: AC
  Administered 2012-12-27: 20 mg via INTRAVENOUS

## 2012-12-27 MED ORDER — SODIUM CHLORIDE 0.9 % IV SOLN
1800.0000 mg/m2 | INTRAVENOUS | Status: DC
  Administered 2012-12-27: 3900 mg via INTRAVENOUS
  Filled 2012-12-27: qty 78

## 2012-12-27 MED ORDER — IRINOTECAN HCL CHEMO INJECTION 100 MG/5ML
125.0000 mg/m2 | Freq: Once | INTRAVENOUS | Status: AC
  Administered 2012-12-27: 270 mg via INTRAVENOUS
  Filled 2012-12-27: qty 13.5

## 2012-12-27 MED ORDER — LEUCOVORIN CALCIUM INJECTION 350 MG
301.0000 mg/m2 | Freq: Once | INTRAVENOUS | Status: AC
  Administered 2012-12-27: 650 mg via INTRAVENOUS
  Filled 2012-12-27: qty 32.5

## 2012-12-27 NOTE — Progress Notes (Signed)
Hematology and Oncology Follow Up Visit  Todd [name] [id number] 12/23/1929 79 y.o. 12/27/2012 10:22 AM Todd [name], MDBreen, Todd Lamas, MD   Principle Diagnosis: 79 year old with stage IV colon cancer diagnosed in 08/2012. He presented with a colonic mass and liver lesions.  KRAS wild type.   Prior Therapy: He is S/P LAPAROSCOPIC ASSISTED SIGMOID COLON RESECTION, END Left OSTOMY, Hartmann's pouch and acement of power port done on 09/07/2012.   Current therapy:  He started FOLFIRI and Avastin on 3/26. Avastin was stopped after 4 cycles due to epistaxis. Here for cycle 6 today.  Interim History: Todd [name] presents today for a follow up visit. He is a very nice man with stage IV colon cancer. He under went surgical resection on 09/07/2012 and has been doing well chemotherapy in general. He has received first 5 cycles of chemotherapy with little complications. Had mild diarrhea for about 3 days and took Imodium. Thinks diarrhea was worse with last cycle. No longer having issues with constipation. No chest pain, shortness of breath, abdominal pain, nausea, or vomiting. Appetite and weight stable.  HOverall, continues to have good quality of life. He has reported more epistaxis at this time despite D/C of Avastin. He is noticing the bleeding more persistent and lasing longer than before.    Medications: I have reviewed the patient's current medications.  Current Outpatient Prescriptions  Medication Sig Dispense Refill  . aspirin 81 MG tablet Take 81 mg by mouth daily.        . benazepril (LOTENSIN) 10 MG tablet Take 1 tablet (10 mg total) by mouth daily.  90 tablet  3  . chlorhexidine (PERIDEX) 0.12 % solution Use as directed 15 mLs in the mouth or throat 2 (two) times daily. Use as directed for mouth irritation      . colchicine 0.6 MG tablet Take 0.6 mg by mouth daily as needed (for gout flares).      . diclofenac (VOLTAREN) 75 MG EC tablet Take 1 tablet (75 mg total) by mouth 2 (two) times daily  as needed.  180 tablet  0  . diphenoxylate-atropine (LOMOTIL) 2.5-0.025 MG per tablet Take 2 tablets by mouth 4 (four) times daily as needed for diarrhea or loose stools.  60 tablet  1  . doxazosin (CARDURA) 8 MG tablet Take 8 mg by mouth daily.       . feeding supplement (ENSURE COMPLETE) LIQD Take 237 mLs by mouth 2 (two) times daily between meals.  60 Bottle  2  . gabapentin (NEURONTIN) 100 MG capsule       . hydrochlorothiazide (HYDRODIURIL) 25 MG tablet Take 1 tablet (25 mg total) by mouth daily.  90 tablet  3  . isosorbide mononitrate (IMDUR) 60 MG 24 hr tablet Take 1 tablet (60 mg total) by mouth daily.  90 tablet  3  . lidocaine-prilocaine (EMLA) cream Apply topically as needed. Prior to chemotherapy treatment, Apply approx 1/2 teaspoon to skin over port, do not rub in. Cover with plastic,  30 g  1  . loratadine (CLARITIN) 10 MG tablet Take 10 mg by mouth daily.        . metoprolol succinate (TOPROL-XL) 50 MG 24 hr tablet Take 50 mg by mouth daily. Take with or immediately following a meal.      . Multiple Vitamin (MULTIVITAMIN) tablet Take 1 tablet by mouth daily.        . nitroGLYCERIN (NITROSTAT) 0.4 MG SL tablet Place 0.4 mg under the tongue every 5 (  five) minutes as needed.        . nystatin cream (MYCOSTATIN) Apply topically 2 (two) times daily.  30 g  2  . Polyethyl Glycol-Propyl Glycol (SYSTANE OP) Apply 1 drop to eye daily. 1 or 2 drops in the affected eye as needed      . prochlorperazine (COMPAZINE) 10 MG tablet Take 1 tablet (10 mg total) by mouth every 6 (six) hours as needed.  30 tablet  1  . simvastatin (ZOCOR) 40 MG tablet Take 1 tablet (40 mg total) by mouth at bedtime.  90 tablet  3  . zoster vaccine live, PF, (ZOSTAVAX) 45409 UNT/0.65ML injection Inject into the skin once. Dispense to patient to be admin in doctors office        No current facility-administered medications for this visit.    Allergies: No Known Allergies  Past Medical History, Surgical history,  Social history, and Family History were reviewed and updated.  Review of Systems: Constitutional:  Negative for fever, chills, night sweats, anorexia, weight loss, pain. Cardiovascular: no chest pain or dyspnea on exertion Respiratory: negative Neurological: negative Dermatological: negative ENT: negative Skin: Negative. Gastrointestinal: negative Genito-Urinary: negative Hematological and Lymphatic:  Breast: negative Musculoskeletal: negative Remaining ROS negative.  Physical Exam: Blood pressure 121/60, pulse 57, temperature 97 F (36.1 C), temperature source Oral, resp. rate 18, height 5\' 10"  (1.778 m), weight 205 lb (92.987 kg). ECOG: 1 General appearance: alert Head: Normocephalic, without obvious abnormality, atraumatic Neck: no adenopathy, no carotid bruit, no JVD, supple, symmetrical, trachea midline and thyroid not enlarged, symmetric, no tenderness/mass/nodules Lymph nodes: Cervical, supraclavicular, and axillary nodes normal. Heart:regular rate and rhythm, S1, S2 normal, no murmur, click, rub or gallop Lung:chest clear, no wheezing, rales, normal symmetric air entry Abdomen: soft, non-tender, without masses or organomegaly EXT:no erythema, induration, or nodules Skin: Red excoriated area to lower abdomen. Skin is macerated under dressing.   Lab Results: Lab Results  Component Value Date   WBC 8.3 12/27/2012   HGB 10.9* 12/27/2012   HCT 31.8* 12/27/2012   MCV 87.1 12/27/2012   PLT 136* 12/27/2012     Chemistry      Component Value Date/Time   NA 139 12/13/2012 0745   NA 135 09/13/2012 0510   K 4.0 12/13/2012 0745   K 3.6 09/13/2012 0510   CL 105 12/13/2012 0745   CL 100 09/13/2012 0510   CO2 26 12/13/2012 0745   CO2 28 09/13/2012 0510   BUN 20.0 12/13/2012 0745   BUN 16 09/13/2012 0510   CREATININE 0.9 12/13/2012 0745   CREATININE 0.70 09/13/2012 0510   CREATININE 0.76 06/27/2012 0856      Component Value Date/Time   CALCIUM 8.8 12/13/2012 0745   CALCIUM 8.8 09/13/2012  0510   ALKPHOS 46 12/13/2012 0745   ALKPHOS 173* 09/04/2012 2215   AST 18 12/13/2012 0745   AST 39* 09/04/2012 2215   ALT 11 12/13/2012 0745   ALT 28 09/04/2012 2215   BILITOT 0.67 12/13/2012 0745   BILITOT 0.4 09/04/2012 2215      Impression and Plan:  79 year old man with :  1. Stage IV colon cancer. He is S/P sigmoid colon resection with a bulky tumor in the liver. On palliative chemotherapy with FOLFIRI/Avastin. He is S/P cycle 5 cycles. CT scan results from 5/19 (after 4 cycles) showed about 25% reduction of his tumor burden. Recommend that he proceed with cycle 6 of chemo today without dose modification. We will repeat CT scan after cycle  8.   2. Port management. Port in place, no flush will be needed.   3. Candidiasis to lower abdomen.Improved now.  4. Epistaxis: Avastin has been stopped. I have recommended evaluation by ENT. He will think about this. He plans to talk with PCP for referral to ENT if he decides to pursue this.  5. Diarrhea. Due to Irinotecan. Recommend that he continue Imodium up to 8 tabs per day. I have also given him a prescription for Lomotil to alternate with the Imodium.  6. Follow up: in 2 weeks for cycle 7.    [place], [place] 6/4/201410:22 AM

## 2012-12-27 NOTE — Patient Instructions (Addendum)
[facility] Discharge Instructions for Patients Receiving Chemotherapy  Today you received the following chemotherapy agents Camptosar, Leucovorin and 5FU.  To help prevent nausea and vomiting after your treatment, we encourage you to take your nausea medication.   If you develop nausea and vomiting that is not controlled by your nausea medication, call the clinic.   BELOW ARE SYMPTOMS THAT SHOULD BE REPORTED IMMEDIATELY:  *FEVER GREATER THAN 100.5 F  *CHILLS WITH OR WITHOUT FEVER  NAUSEA AND VOMITING THAT IS NOT CONTROLLED WITH YOUR NAUSEA MEDICATION  *UNUSUAL SHORTNESS OF BREATH  *UNUSUAL BRUISING OR BLEEDING  TENDERNESS IN MOUTH AND THROAT WITH OR WITHOUT PRESENCE OF ULCERS  *URINARY PROBLEMS  *BOWEL PROBLEMS  UNUSUAL RASH Items with * indicate a potential emergency and should be followed up as soon as possible.  Feel free to call the clinic you have any questions or concerns. The clinic phone number is [phone].    

## 2012-12-28 ENCOUNTER — Telehealth: Payer: Self-pay | Admitting: Family Medicine

## 2012-12-28 ENCOUNTER — Telehealth (INDEPENDENT_AMBULATORY_CARE_PROVIDER_SITE_OTHER): Payer: Self-pay

## 2012-12-28 NOTE — Telephone Encounter (Signed)
Spoke with patient and scheduled and appt for 01/02/13 with Dr. Mauricio Po to discuss.  Sohail Capraro, Darlyne Russian, CMA

## 2012-12-28 NOTE — Telephone Encounter (Signed)
Patient is calling requesting a referral for a Podiatrist and an ENT and to have the after CT discussion.

## 2012-12-29 ENCOUNTER — Ambulatory Visit (HOSPITAL_BASED_OUTPATIENT_CLINIC_OR_DEPARTMENT_OTHER): Payer: Medicare Other

## 2012-12-29 VITALS — BP 123/65 | HR 57 | Temp 98.5°F | Resp 20

## 2012-12-29 DIAGNOSIS — C187 Malignant neoplasm of sigmoid colon: Secondary | ICD-10-CM

## 2012-12-29 DIAGNOSIS — Z452 Encounter for adjustment and management of vascular access device: Secondary | ICD-10-CM

## 2012-12-29 DIAGNOSIS — C787 Secondary malignant neoplasm of liver and intrahepatic bile duct: Secondary | ICD-10-CM

## 2012-12-29 MED ORDER — HEPARIN SOD (PORK) LOCK FLUSH 100 UNIT/ML IV SOLN
500.0000 [IU] | Freq: Once | INTRAVENOUS | Status: AC | PRN
  Administered 2012-12-29: 500 [IU]
  Filled 2012-12-29: qty 5

## 2012-12-29 MED ORDER — SODIUM CHLORIDE 0.9 % IJ SOLN
10.0000 mL | INTRAMUSCULAR | Status: DC | PRN
  Administered 2012-12-29: 10 mL
  Filled 2012-12-29: qty 10

## 2013-01-02 ENCOUNTER — Ambulatory Visit (INDEPENDENT_AMBULATORY_CARE_PROVIDER_SITE_OTHER): Payer: Medicare Other | Admitting: Family Medicine

## 2013-01-02 ENCOUNTER — Encounter: Payer: Self-pay | Admitting: *Deleted

## 2013-01-02 ENCOUNTER — Encounter: Payer: Self-pay | Admitting: Family Medicine

## 2013-01-02 VITALS — BP 129/68 | HR 64 | Ht 69.0 in | Wt 203.0 lb

## 2013-01-02 DIAGNOSIS — C189 Malignant neoplasm of colon, unspecified: Secondary | ICD-10-CM

## 2013-01-02 DIAGNOSIS — B351 Tinea unguium: Secondary | ICD-10-CM

## 2013-01-02 DIAGNOSIS — C787 Secondary malignant neoplasm of liver and intrahepatic bile duct: Secondary | ICD-10-CM

## 2013-01-02 DIAGNOSIS — R040 Epistaxis: Secondary | ICD-10-CM

## 2013-01-02 NOTE — Progress Notes (Signed)
Faxed signed order for ostomy supplies to [facility]. [phone]

## 2013-01-02 NOTE — Assessment & Plan Note (Signed)
Has continued despite stopping Avastin.  Will refer for ENT evaluation of other possible causes of epistaxis. Not concerning for large-volume blood loss at this time; stops easily with minimal pressure.

## 2013-01-02 NOTE — Progress Notes (Signed)
  Subjective:    Patient ID: Todd [name], male    DOB: 16-Jul-1930, 79 y.o.   MRN: [id number]  HPI Mr. Stmarie is here with his wife for evaluation of 2 issues.  He is currently undergoing palliative chemotherapy for stage IV colon cancer, had been on Avastin and developed epistaxis from both nares.  Despite discontinuation of the Avastin, he continues with 'continuous' blood from both nares.  Not brisk; is easy to stop with pressure.  No other sources of bleeding (no bleeding gums, minor scrapes, etc).    Also to evaluate thickening of toenails on all 10 toes.  Difficulty trimming nails.  Would like DPM to address his toenails, as well as persistent tinea pedis that is irritating/macerating the skin between Right toes 4 and 5.  He has used ketoconazole 2% cream intermittently, admits he has not been applying consistently.    Review of Systems  Some diarrhea surrounding chemo treatments.  This is an 'off' week, will restart next round of chemotx next week.       Objective:   Physical Exam Well appearing, no apparent distress.  HEENT Neck supple.  No visible source of bleeding in nasal mucosa.  No frontal or maxillary sinus tenderness.  FEET: slight maceration of skin between right toes 4 and 5.  Yellowish discoloration and thickening of distal nails on all 10 digits. Overgrown nails.        Assessment & Plan:

## 2013-01-02 NOTE — Assessment & Plan Note (Signed)
Thickening nails and tinea pedis; for DPM evaluation and attention to nail care.  Patient prefers appointment at a time when he is not initiating a cycle of chemotherapy.

## 2013-01-02 NOTE — Patient Instructions (Addendum)
It was a pleasure to see you today.  I placed referrals for ENT and podiatry.   In the meantime, I ask that you use the ketoconazole cream 2%, between the right 4th and 5th toes, one time daily.

## 2013-01-02 NOTE — Assessment & Plan Note (Signed)
Currently undergoing chemotherapy; tolerating well with exception of diarrhea, which is worse when initiating the cycle.  He has some additional paperwork for planned trip next year, for which he will try to get refund and cancel the trip.  I am glad to complete whatever paperwork he needs to this end.

## 2013-01-09 ENCOUNTER — Encounter: Payer: Self-pay | Admitting: Cardiology

## 2013-01-09 DIAGNOSIS — G629 Polyneuropathy, unspecified: Secondary | ICD-10-CM | POA: Insufficient documentation

## 2013-01-09 DIAGNOSIS — M069 Rheumatoid arthritis, unspecified: Secondary | ICD-10-CM

## 2013-01-09 DIAGNOSIS — E119 Type 2 diabetes mellitus without complications: Secondary | ICD-10-CM

## 2013-01-09 DIAGNOSIS — I219 Acute myocardial infarction, unspecified: Secondary | ICD-10-CM | POA: Insufficient documentation

## 2013-01-09 DIAGNOSIS — I10 Essential (primary) hypertension: Secondary | ICD-10-CM

## 2013-01-09 DIAGNOSIS — C189 Malignant neoplasm of colon, unspecified: Secondary | ICD-10-CM

## 2013-01-09 DIAGNOSIS — N400 Enlarged prostate without lower urinary tract symptoms: Secondary | ICD-10-CM | POA: Insufficient documentation

## 2013-01-10 ENCOUNTER — Ambulatory Visit (HOSPITAL_BASED_OUTPATIENT_CLINIC_OR_DEPARTMENT_OTHER): Payer: Medicare Other | Admitting: Oncology

## 2013-01-10 ENCOUNTER — Telehealth: Payer: Self-pay | Admitting: Oncology

## 2013-01-10 ENCOUNTER — Encounter: Payer: Self-pay | Admitting: *Deleted

## 2013-01-10 ENCOUNTER — Other Ambulatory Visit (HOSPITAL_BASED_OUTPATIENT_CLINIC_OR_DEPARTMENT_OTHER): Payer: Medicare Other | Admitting: Lab

## 2013-01-10 ENCOUNTER — Ambulatory Visit (HOSPITAL_BASED_OUTPATIENT_CLINIC_OR_DEPARTMENT_OTHER): Payer: Medicare Other

## 2013-01-10 ENCOUNTER — Encounter: Payer: Self-pay | Admitting: Cardiovascular Disease

## 2013-01-10 VITALS — BP 139/67 | HR 69 | Temp 97.3°F | Resp 18 | Ht 69.0 in | Wt 201.6 lb

## 2013-01-10 DIAGNOSIS — C187 Malignant neoplasm of sigmoid colon: Secondary | ICD-10-CM

## 2013-01-10 DIAGNOSIS — C787 Secondary malignant neoplasm of liver and intrahepatic bile duct: Secondary | ICD-10-CM

## 2013-01-10 DIAGNOSIS — R197 Diarrhea, unspecified: Secondary | ICD-10-CM

## 2013-01-10 DIAGNOSIS — R040 Epistaxis: Secondary | ICD-10-CM

## 2013-01-10 DIAGNOSIS — C189 Malignant neoplasm of colon, unspecified: Secondary | ICD-10-CM

## 2013-01-10 DIAGNOSIS — B379 Candidiasis, unspecified: Secondary | ICD-10-CM

## 2013-01-10 DIAGNOSIS — Z5111 Encounter for antineoplastic chemotherapy: Secondary | ICD-10-CM

## 2013-01-10 DIAGNOSIS — Z5112 Encounter for antineoplastic immunotherapy: Secondary | ICD-10-CM

## 2013-01-10 LAB — CBC WITH DIFFERENTIAL/PLATELET
Basophils Absolute: 0 10*3/uL (ref 0.0–0.1)
Eosinophils Absolute: 0.3 10*3/uL (ref 0.0–0.5)
HGB: 11.4 g/dL — ABNORMAL LOW (ref 13.0–17.1)
MCV: 89.6 fL (ref 79.3–98.0)
MONO%: 7.5 % (ref 0.0–14.0)
NEUT#: 5.1 10*3/uL (ref 1.5–6.5)
Platelets: 155 10*3/uL (ref 140–400)
RDW: 17.5 % — ABNORMAL HIGH (ref 11.0–14.6)

## 2013-01-10 LAB — COMPREHENSIVE METABOLIC PANEL (CC13)
Alkaline Phosphatase: 49 U/L (ref 40–150)
BUN: 14.2 mg/dL (ref 7.0–26.0)
Chloride: 105 mEq/L (ref 98–107)
Glucose: 124 mg/dl — ABNORMAL HIGH (ref 70–99)
Potassium: 3.9 mEq/L (ref 3.5–5.1)
Total Protein: 6.6 g/dL (ref 6.4–8.3)

## 2013-01-10 MED ORDER — ATROPINE SULFATE 1 MG/ML IJ SOLN
0.5000 mg | Freq: Once | INTRAMUSCULAR | Status: AC | PRN
  Administered 2013-01-10: 0.5 mg via INTRAVENOUS

## 2013-01-10 MED ORDER — IRINOTECAN HCL CHEMO INJECTION 100 MG/5ML
125.0000 mg/m2 | Freq: Once | INTRAVENOUS | Status: AC
  Administered 2013-01-10: 270 mg via INTRAVENOUS
  Filled 2013-01-10: qty 13.5

## 2013-01-10 MED ORDER — LEUCOVORIN CALCIUM INJECTION 350 MG
301.0000 mg/m2 | Freq: Once | INTRAVENOUS | Status: AC
  Administered 2013-01-10: 650 mg via INTRAVENOUS
  Filled 2013-01-10: qty 32.5

## 2013-01-10 MED ORDER — SODIUM CHLORIDE 0.9 % IV SOLN
Freq: Once | INTRAVENOUS | Status: AC
  Administered 2013-01-10: 10:00:00 via INTRAVENOUS

## 2013-01-10 MED ORDER — DEXAMETHASONE SODIUM PHOSPHATE 20 MG/5ML IJ SOLN
20.0000 mg | Freq: Once | INTRAMUSCULAR | Status: AC
  Administered 2013-01-10: 20 mg via INTRAVENOUS

## 2013-01-10 MED ORDER — ONDANSETRON 16 MG/50ML IVPB (CHCC)
16.0000 mg | Freq: Once | INTRAVENOUS | Status: AC
  Administered 2013-01-10: 16 mg via INTRAVENOUS

## 2013-01-10 MED ORDER — SODIUM CHLORIDE 0.9 % IV SOLN
5.0000 mg/kg | Freq: Once | INTRAVENOUS | Status: AC
  Administered 2013-01-10: 475 mg via INTRAVENOUS
  Filled 2013-01-10: qty 19

## 2013-01-10 MED ORDER — FLUOROURACIL CHEMO INJECTION 2.5 GM/50ML
300.0000 mg/m2 | Freq: Once | INTRAVENOUS | Status: AC
  Administered 2013-01-10: 650 mg via INTRAVENOUS
  Filled 2013-01-10: qty 13

## 2013-01-10 MED ORDER — SODIUM CHLORIDE 0.9 % IV SOLN
1800.0000 mg/m2 | INTRAVENOUS | Status: DC
  Administered 2013-01-10: 3900 mg via INTRAVENOUS
  Filled 2013-01-10: qty 78

## 2013-01-10 NOTE — Progress Notes (Signed)
Hematology and Oncology Follow Up Visit  Todd [name] [id number] 11/11/1929 79 y.o. 01/10/2013 9:09 AM [name], MDBreen, [name], MD   Principle Diagnosis: 79 year old with stage IV colon cancer diagnosed in 08/2012. He presented with a colonic mass and liver lesions.  KRAS wild type.   Prior Therapy: He is S/P LAPAROSCOPIC ASSISTED SIGMOID COLON RESECTION, END Left OSTOMY, Hartmann's pouch and acement of power port done on 09/07/2012.   Current therapy:  He started FOLFIRI and Avastin on 3/26. Avastin was stopped after 4 cycles due to epistaxis. Here for cycle 7 today.  Interim History: Todd [name] presents today for a follow up visit. He is a very nice man with stage IV colon cancer. He under went surgical resection on 09/07/2012 and has been doing well chemotherapy in general. He has received first 6 cycles of chemotherapy with little complications. Had mild diarrhea for about 1-2 days and took Imodium. Thinks diarrhea was worse with last cycle. No longer having issues with constipation. No chest pain, shortness of breath, abdominal pain, nausea, or vomiting. Appetite and weight stable. Overall, continues to have good quality of life. He has reported no change in epistaxis at this time despite D/C of Avastin. He is noticing the bleeding is less persistent and manageable.   Medications: I have reviewed the patient's current medications.  Current Outpatient Prescriptions  Medication Sig Dispense Refill  . aspirin 81 MG tablet Take 81 mg by mouth daily.        . benazepril (LOTENSIN) 10 MG tablet Take 1 tablet (10 mg total) by mouth daily.  90 tablet  3  . chlorhexidine (PERIDEX) 0.12 % solution Use as directed 15 mLs in the mouth or throat 2 (two) times daily. Use as directed for mouth irritation      . colchicine 0.6 MG tablet Take 0.6 mg by mouth daily as needed (for gout flares).      . diclofenac (VOLTAREN) 75 MG EC tablet Take 1 tablet (75 mg total) by mouth 2 (two) times daily as  needed.  180 tablet  0  . diphenoxylate-atropine (LOMOTIL) 2.5-0.025 MG per tablet Take 2 tablets by mouth 4 (four) times daily as needed for diarrhea or loose stools.  60 tablet  1  . doxazosin (CARDURA) 8 MG tablet Take 8 mg by mouth daily.       . feeding supplement (ENSURE COMPLETE) LIQD Take 237 mLs by mouth 2 (two) times daily between meals.  60 Bottle  2  . gabapentin (NEURONTIN) 100 MG capsule       . hydrochlorothiazide (HYDRODIURIL) 25 MG tablet Take 1 tablet (25 mg total) by mouth daily.  90 tablet  3  . isosorbide mononitrate (IMDUR) 60 MG 24 hr tablet Take 1 tablet (60 mg total) by mouth daily.  90 tablet  3  . lidocaine-prilocaine (EMLA) cream Apply topically as needed. Prior to chemotherapy treatment, Apply approx 1/2 teaspoon to skin over port, do not rub in. Cover with plastic,  30 g  1  . loratadine (CLARITIN) 10 MG tablet Take 10 mg by mouth daily.        . metoprolol succinate (TOPROL-XL) 50 MG 24 hr tablet Take 50 mg by mouth daily. Take with or immediately following a meal.      . Multiple Vitamin (MULTIVITAMIN) tablet Take 1 tablet by mouth daily.        . nitroGLYCERIN (NITROSTAT) 0.4 MG SL tablet Place 0.4 mg under the tongue every 5 (five) minutes  as needed.        . nystatin cream (MYCOSTATIN) Apply topically 2 (two) times daily.  30 g  2  . Polyethyl Glycol-Propyl Glycol (SYSTANE OP) Apply 1 drop to eye daily. 1 or 2 drops in the affected eye as needed      . prochlorperazine (COMPAZINE) 10 MG tablet Take 1 tablet (10 mg total) by mouth every 6 (six) hours as needed.  30 tablet  1  . simvastatin (ZOCOR) 40 MG tablet Take 1 tablet (40 mg total) by mouth at bedtime.  90 tablet  3  . zoster vaccine live, PF, (ZOSTAVAX) 62130 UNT/0.65ML injection Inject into the skin once. Dispense to patient to be admin in doctors office        No current facility-administered medications for this visit.    Allergies: No Known Allergies  Past Medical History, Surgical history, Social  history, and Family History were reviewed and updated.  Review of Systems: Constitutional:  Negative for fever, chills, night sweats, anorexia, weight loss, pain. Cardiovascular: no chest pain or dyspnea on exertion Respiratory: negative Neurological: negative Dermatological: negative ENT: negative Skin: Negative. Gastrointestinal: negative Genito-Urinary: negative Hematological and Lymphatic:  Breast: negative Musculoskeletal: negative Remaining ROS negative.  Physical Exam: Blood pressure 139/67, pulse 69, temperature 97.3 F (36.3 C), temperature source Oral, resp. rate 18, height 5\' 9"  (1.753 m), weight 201 lb 9.6 oz (91.445 kg). ECOG: 1 General appearance: alert Head: Normocephalic, without obvious abnormality, atraumatic Neck: no adenopathy, no carotid bruit, no JVD, supple, symmetrical, trachea midline and thyroid not enlarged, symmetric, no tenderness/mass/nodules Lymph nodes: Cervical, supraclavicular, and axillary nodes normal. Heart:regular rate and rhythm, S1, S2 normal, no murmur, click, rub or gallop Lung:chest clear, no wheezing, rales, normal symmetric air entry Abdomen: soft, non-tender, without masses or organomegaly EXT:no erythema, induration, or nodules Skin: Red excoriated area to lower abdomen. Skin is macerated under dressing.   Lab Results: Lab Results  Component Value Date   WBC 6.9 01/10/2013   HGB 11.4* 01/10/2013   HCT 33.6* 01/10/2013   MCV 89.6 01/10/2013   PLT 155 01/10/2013     Chemistry      Component Value Date/Time   NA 138 12/27/2012 0929   NA 135 09/13/2012 0510   K 3.8 12/27/2012 0929   K 3.6 09/13/2012 0510   CL 105 12/27/2012 0929   CL 100 09/13/2012 0510   CO2 26 12/27/2012 0929   CO2 28 09/13/2012 0510   BUN 13.9 12/27/2012 0929   BUN 16 09/13/2012 0510   CREATININE 0.9 12/27/2012 0929   CREATININE 0.70 09/13/2012 0510   CREATININE 0.76 06/27/2012 0856      Component Value Date/Time   CALCIUM 8.9 12/27/2012 0929   CALCIUM 8.8 09/13/2012 0510    ALKPHOS 47 12/27/2012 0929   ALKPHOS 173* 09/04/2012 2215   AST 19 12/27/2012 0929   AST 39* 09/04/2012 2215   ALT 15 12/27/2012 0929   ALT 28 09/04/2012 2215   BILITOT 0.97 12/27/2012 0929   BILITOT 0.4 09/04/2012 2215      Impression and Plan:  79 year old man with :  1. Stage IV colon cancer. He is S/P sigmoid colon resection with a bulky tumor in the liver. On palliative chemotherapy with FOLFIRI/Avastin. He is S/P cycle 5 cycles. CT scan results from 5/19 (after 4 cycles) showed about 25% reduction of his tumor burden. Recommend that he proceed with cycle 7 of chemotherapy today without dose modification. We will repeat CT scan after cycle  8.   2. Port management. Port in place, no flush will be needed.   3. Candidiasis to lower abdomen.Improved now.  4. Epistaxis: No change off Avastin in the last month. I will re challenge him with it again. If this get worse, we will stop it again.   5. Diarrhea. Due to Irinotecan. Recommend that he continue Imodium and Lomotil.   6. Follow up: in 2 weeks for cycle 8.    SHADAD,FIRAS 6/18/20149:09 AM

## 2013-01-10 NOTE — Patient Instructions (Signed)
[facility] Discharge Instructions for Patients Receiving Chemotherapy  Today you received the following chemotherapy agents:  Avastin, Camptosar, Leucovorin and 5-FU.  To help prevent nausea and vomiting after your treatment, we encourage you to take your nausea medication as ordered per MD.   If you develop nausea and vomiting that is not controlled by your nausea medication, call the clinic.   BELOW ARE SYMPTOMS THAT SHOULD BE REPORTED IMMEDIATELY:  *FEVER GREATER THAN 100.5 F  *CHILLS WITH OR WITHOUT FEVER  NAUSEA AND VOMITING THAT IS NOT CONTROLLED WITH YOUR NAUSEA MEDICATION  *UNUSUAL SHORTNESS OF BREATH  *UNUSUAL BRUISING OR BLEEDING  TENDERNESS IN MOUTH AND THROAT WITH OR WITHOUT PRESENCE OF ULCERS  *URINARY PROBLEMS  *BOWEL PROBLEMS  UNUSUAL RASH Items with * indicate a potential emergency and should be followed up as soon as possible.  Feel free to call the clinic you have any questions or concerns. The clinic phone number is [phone].    

## 2013-01-11 ENCOUNTER — Ambulatory Visit (INDEPENDENT_AMBULATORY_CARE_PROVIDER_SITE_OTHER): Payer: Medicare Other | Admitting: Cardiovascular Disease

## 2013-01-11 ENCOUNTER — Encounter: Payer: Self-pay | Admitting: Cardiovascular Disease

## 2013-01-11 ENCOUNTER — Encounter: Payer: Self-pay | Admitting: *Deleted

## 2013-01-11 VITALS — BP 128/52 | HR 59 | Ht 69.5 in | Wt 207.0 lb

## 2013-01-11 DIAGNOSIS — I255 Ischemic cardiomyopathy: Secondary | ICD-10-CM

## 2013-01-11 DIAGNOSIS — Z79899 Other long term (current) drug therapy: Secondary | ICD-10-CM

## 2013-01-11 DIAGNOSIS — I2510 Atherosclerotic heart disease of native coronary artery without angina pectoris: Secondary | ICD-10-CM

## 2013-01-11 DIAGNOSIS — R0989 Other specified symptoms and signs involving the circulatory and respiratory systems: Secondary | ICD-10-CM

## 2013-01-11 DIAGNOSIS — I2589 Other forms of chronic ischemic heart disease: Secondary | ICD-10-CM

## 2013-01-11 DIAGNOSIS — E785 Hyperlipidemia, unspecified: Secondary | ICD-10-CM

## 2013-01-11 NOTE — Progress Notes (Signed)
01/11/2013 Todd Todd [name]   06/15/1930  [id number]  Primary Physician Todd Barthel, MD Primary Cardiologist: Todd Gess MD Todd Todd [name]   HPI:  The Todd [name] is an 79 year old, mildly overweight, married Caucasian male, father of 2, grandfather to 5 grandchildren without is formerly a Todd [name] of Dr. Caprice [name]. I am assuming his care in Dr. Fredirick [name] absence. He has a history of ischemic heart disease status post anterior wall myocardial infarction in 1990, at which time he underwent cardiac catheterization revealing a total LAD with collaterals. His EF at that time was 30%. He has had no evidence of congestive heart failure. His other problems include hypertension and hyperlipidemia as well as gout. He was recently diagnosed with colon cancer and underwent resection by Dr. Ovidio [name] with a colostomy. He had no cardiovascular events in the perioperative period. He is scheduled to go on chemotherapy to be administered by Dr. Clelia [name] with reduced dose FOLFIRI with Avastin. He is otherwise asymptomatic. He has received chemotherapy for his colon cancer. A 2-D echo performed 3 months ago revealed an EF in the 35% range with wall motion adenoids consistent with his LAD occlusion.     Current Outpatient Prescriptions  Medication Sig Dispense Refill  . aspirin 81 MG tablet Take 81 mg by mouth daily.        . benazepril (LOTENSIN) 10 MG tablet Take 1 tablet (10 mg total) by mouth daily.  90 tablet  3  . chlorhexidine (PERIDEX) 0.12 % solution Use as directed 15 mLs in the mouth or throat 2 (two) times daily. Use as directed for mouth irritation      . colchicine 0.6 MG tablet Take 0.6 mg by mouth daily as needed (for gout flares).      . diclofenac (VOLTAREN) 75 MG EC tablet Take 1 tablet (75 mg total) by mouth 2 (two) times daily as needed.  180 tablet  0  . diphenoxylate-atropine (LOMOTIL) 2.5-0.025 MG per tablet Take 2 tablets by mouth 4 (four) times daily as needed for diarrhea or  loose stools.  60 tablet  1  . doxazosin (CARDURA) 8 MG tablet Take 8 mg by mouth daily.       . feeding supplement (ENSURE COMPLETE) LIQD Take 237 mLs by mouth 2 (two) times daily between meals.  60 Bottle  2  . gabapentin (NEURONTIN) 300 MG capsule Take 300 mg by mouth as needed.      . hydrochlorothiazide (HYDRODIURIL) 25 MG tablet Take 1 tablet (25 mg total) by mouth daily.  90 tablet  3  . isosorbide mononitrate (IMDUR) 60 MG 24 hr tablet Take 1 tablet (60 mg total) by mouth daily.  90 tablet  3  . lidocaine-prilocaine (EMLA) cream Apply topically as needed. Prior to chemotherapy treatment, Apply approx 1/2 teaspoon to skin over port, do not rub in. Cover with plastic,  30 g  1  . loratadine (CLARITIN) 10 MG tablet Take 10 mg by mouth daily.        . metoprolol succinate (TOPROL-XL) 50 MG 24 hr tablet Take 50 mg by mouth daily. Take with or immediately following a meal.      . Multiple Vitamin (MULTIVITAMIN) tablet Take 1 tablet by mouth daily.        . nitroGLYCERIN (NITROSTAT) 0.4 MG SL tablet Place 0.4 mg under the tongue every 5 (five) minutes as needed.        . nystatin cream (MYCOSTATIN) Apply topically 2 (two) times daily.  30 g  2  . Polyethyl Glycol-Propyl Glycol (SYSTANE OP) Apply 1 drop to eye daily. 1 or 2 drops in the affected eye as needed      . prochlorperazine (COMPAZINE) 10 MG tablet Take 1 tablet (10 mg total) by mouth every 6 (six) hours as needed.  30 tablet  1  . simvastatin (ZOCOR) 40 MG tablet Take 1 tablet (40 mg total) by mouth at bedtime.  90 tablet  3  . zoster vaccine live, PF, (ZOSTAVAX) 14782 UNT/0.65ML injection Inject into the skin once. Dispense to Todd [name] to be admin in doctors office        No current facility-administered medications for this visit.    No Known Allergies  History   Social History  . Marital Status: Married    Spouse Name: N/A    Number of Children: 2  . Years of Education: N/A   Occupational History  . Retired, food and  nutritional services    Social History Main Topics  . Smoking status: Former Games developer  . Smokeless tobacco: Never Used     Comment: Quit 45 years ago.  . Alcohol Use: Yes     Comment: 1 drink a month  . Drug Use: No  . Sexually Active: Not Currently   Other Topics Concern  . Not on file   Social History Narrative   Emergency Contact:wife, Todd Todd [name] at [phone]   Health Care POA:   Who lives with you: Lives with wife in 1 story home.   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  Has a varied diet and is currently trying to lose weight by decreasing protein and carbohydrates daily.   Exercise: water aerobix 3x a week for 45 mins and uses row bike 2 times a week for 20 minutes   Seatbelts: wears seatbelt while in vehicles   Sun Exposure/Protection: Todd [name] reports wearing sun screen   Hobbies: numbers, cruises              Review of Systems: General: negative for chills, fever, night sweats or weight changes.  Cardiovascular: negative for chest pain, dyspnea on exertion, edema, orthopnea, palpitations, paroxysmal nocturnal dyspnea or shortness of breath Dermatological: negative for rash Respiratory: negative for cough or wheezing Urologic: negative for hematuria Abdominal: negative for nausea, vomiting, diarrhea, bright red blood per rectum, melena, or hematemesis Neurologic: negative for visual changes, syncope, or dizziness All other systems reviewed and are otherwise negative except as noted above.    Blood pressure 128/52, pulse 59, height 5' 9.5" (1.765 m), weight 207 lb (93.895 kg).  General appearance: alert and no distress Neck: no adenopathy, no JVD, supple, symmetrical, trachea midline and soft bilateral carotid bruits Lungs: clear to auscultation bilaterally Heart: soft outflow tract murmur Extremities: extremities normal, atraumatic, no cyanosis or edema  EKG sinus bradycardia 59 with incomplete left  bundle branch block unchanged from prior EKGs  ASSESSMENT AND PLAN:   Ischemic cardiomyopathy, EF 30% Myoview 3/12 History of a large anterior wall microinfarction 1990 with cath revealing a total LAD with collaterals. His EF has remained in the 30% range. He has no evidence of heart failure. Recent 2-D echo revealed EF of 35% with wall motions in the LAD the Todd [name]. His last Myoview performed 10/13/2010 revealed large scar in the anterior wall and septum and apex with an EF of 31%.      Todd Gess MD FACP,FACC,FAHA, [facility] 01/11/2013 10:06 AM

## 2013-01-11 NOTE — Patient Instructions (Addendum)
  Your physician wants you to follow-up with him in : 12 months                                             and with an extender in : 6 months                    You will receive a reminder letter in the mail one month in advance. If you don't receive a letter, please call our office to schedule the follow-up appointment.   Your physician recommends that you return for lab work in: 1-2 weeks, fasting      Your physician has ordered the following tests: carotid dopplers (ultrasound of neck arteries)

## 2013-01-11 NOTE — Assessment & Plan Note (Signed)
History of a large anterior wall microinfarction 1990 with cath revealing a total LAD with collaterals. His EF has remained in the 30% range. He has no evidence of heart failure. Recent 2-D echo revealed EF of 35% with wall motions in the LAD the patient. His last Myoview performed 10/13/2010 revealed large scar in the anterior wall and septum and apex with an EF of 31%.

## 2013-01-12 ENCOUNTER — Ambulatory Visit (HOSPITAL_BASED_OUTPATIENT_CLINIC_OR_DEPARTMENT_OTHER): Payer: Medicare Other

## 2013-01-12 VITALS — BP 107/64 | HR 60 | Temp 98.5°F | Resp 18

## 2013-01-12 DIAGNOSIS — C189 Malignant neoplasm of colon, unspecified: Secondary | ICD-10-CM

## 2013-01-12 DIAGNOSIS — Z452 Encounter for adjustment and management of vascular access device: Secondary | ICD-10-CM

## 2013-01-12 DIAGNOSIS — C187 Malignant neoplasm of sigmoid colon: Secondary | ICD-10-CM

## 2013-01-12 DIAGNOSIS — C787 Secondary malignant neoplasm of liver and intrahepatic bile duct: Secondary | ICD-10-CM

## 2013-01-12 MED ORDER — SODIUM CHLORIDE 0.9 % IJ SOLN
10.0000 mL | INTRAMUSCULAR | Status: DC | PRN
  Administered 2013-01-12: 10 mL
  Filled 2013-01-12: qty 10

## 2013-01-12 MED ORDER — HEPARIN SOD (PORK) LOCK FLUSH 100 UNIT/ML IV SOLN
500.0000 [IU] | Freq: Once | INTRAVENOUS | Status: AC | PRN
  Administered 2013-01-12: 500 [IU]
  Filled 2013-01-12: qty 5

## 2013-01-12 NOTE — Patient Instructions (Signed)
Call MD for problems or concerns 

## 2013-01-15 ENCOUNTER — Other Ambulatory Visit: Payer: Medicare Other

## 2013-01-15 LAB — HEPATIC FUNCTION PANEL
AST: 17 U/L (ref 0–37)
Alkaline Phosphatase: 39 U/L (ref 39–117)
Bilirubin, Direct: 0.4 mg/dL — ABNORMAL HIGH (ref 0.0–0.3)
Total Bilirubin: 1.6 mg/dL — ABNORMAL HIGH (ref 0.3–1.2)

## 2013-01-15 LAB — LIPID PANEL: HDL: 48 mg/dL (ref >39)

## 2013-01-17 ENCOUNTER — Encounter: Payer: Self-pay | Admitting: *Deleted

## 2013-01-17 ENCOUNTER — Telehealth: Payer: Self-pay | Admitting: *Deleted

## 2013-01-17 DIAGNOSIS — Z79899 Other long term (current) drug therapy: Secondary | ICD-10-CM

## 2013-01-17 DIAGNOSIS — E785 Hyperlipidemia, unspecified: Secondary | ICD-10-CM

## 2013-01-17 NOTE — Telephone Encounter (Signed)
Message copied by Marella Bile on Wed Jan 17, 2013  5:22 PM ------      Message from: Runell Gess      Created: Wed Jan 17, 2013  6:26 AM       Excellent FLP. Re check LFTs 3 months ------

## 2013-01-19 ENCOUNTER — Ambulatory Visit (HOSPITAL_COMMUNITY)
Admission: RE | Admit: 2013-01-19 | Discharge: 2013-01-19 | Disposition: A | Discharge status: Home / Self Care | Payer: Medicare Other | Source: Ambulatory Visit | Attending: Cardiovascular Disease | Admitting: Cardiovascular Disease

## 2013-01-19 DIAGNOSIS — R0989 Other specified symptoms and signs involving the circulatory and respiratory systems: Secondary | ICD-10-CM | POA: Insufficient documentation

## 2013-01-19 NOTE — Progress Notes (Signed)
Carotid Duplex Completed. °Todd [name] ° °

## 2013-01-24 ENCOUNTER — Ambulatory Visit (HOSPITAL_BASED_OUTPATIENT_CLINIC_OR_DEPARTMENT_OTHER): Payer: Medicare Other

## 2013-01-24 ENCOUNTER — Encounter: Payer: Self-pay | Admitting: Oncology

## 2013-01-24 ENCOUNTER — Other Ambulatory Visit (HOSPITAL_BASED_OUTPATIENT_CLINIC_OR_DEPARTMENT_OTHER): Payer: Medicare Other | Admitting: Lab

## 2013-01-24 ENCOUNTER — Ambulatory Visit (HOSPITAL_BASED_OUTPATIENT_CLINIC_OR_DEPARTMENT_OTHER): Payer: Medicare Other | Admitting: Oncology

## 2013-01-24 VITALS — BP 118/61 | HR 57 | Temp 97.5°F | Resp 18 | Ht 69.0 in | Wt 197.5 lb

## 2013-01-24 DIAGNOSIS — C787 Secondary malignant neoplasm of liver and intrahepatic bile duct: Secondary | ICD-10-CM

## 2013-01-24 DIAGNOSIS — C189 Malignant neoplasm of colon, unspecified: Secondary | ICD-10-CM

## 2013-01-24 DIAGNOSIS — R197 Diarrhea, unspecified: Secondary | ICD-10-CM

## 2013-01-24 DIAGNOSIS — C187 Malignant neoplasm of sigmoid colon: Secondary | ICD-10-CM

## 2013-01-24 DIAGNOSIS — Z5111 Encounter for antineoplastic chemotherapy: Secondary | ICD-10-CM

## 2013-01-24 DIAGNOSIS — R040 Epistaxis: Secondary | ICD-10-CM

## 2013-01-24 LAB — CBC WITH DIFFERENTIAL/PLATELET
BASO%: 1 % (ref 0.0–2.0)
EOS%: 4.6 % (ref 0.0–7.0)
LYMPH%: 20.6 % (ref 14.0–49.0)
MCH: 30.8 pg (ref 27.2–33.4)
MCHC: 34.3 g/dL (ref 32.0–36.0)
MCV: 89.9 fL (ref 79.3–98.0)
MONO#: 0.5 10*3/uL (ref 0.1–0.9)
MONO%: 7.8 % (ref 0.0–14.0)
NEUT%: 66 % (ref 39.0–75.0)
Platelets: 120 10*3/uL — ABNORMAL LOW (ref 140–400)
RBC: 3.57 10*6/uL — ABNORMAL LOW (ref 4.20–5.82)
WBC: 6.3 10*3/uL (ref 4.0–10.3)
nRBC: 0 % (ref 0–0)

## 2013-01-24 LAB — COMPREHENSIVE METABOLIC PANEL (CC13)
ALT: 19 U/L (ref 0–55)
AST: 25 U/L (ref 5–34)
BUN: 20.4 mg/dL (ref 7.0–26.0)
Creatinine: 0.9 mg/dL (ref 0.7–1.3)
Total Bilirubin: 0.87 mg/dL (ref 0.20–1.20)

## 2013-01-24 MED ORDER — LEUCOVORIN CALCIUM INJECTION 350 MG
301.0000 mg/m2 | Freq: Once | INTRAVENOUS | Status: AC
  Administered 2013-01-24: 650 mg via INTRAVENOUS
  Filled 2013-01-24: qty 32.5

## 2013-01-24 MED ORDER — SODIUM CHLORIDE 0.9 % IV SOLN
Freq: Once | INTRAVENOUS | Status: AC
  Administered 2013-01-24: 11:00:00 via INTRAVENOUS

## 2013-01-24 MED ORDER — IRINOTECAN HCL CHEMO INJECTION 100 MG/5ML
125.0000 mg/m2 | Freq: Once | INTRAVENOUS | Status: AC
  Administered 2013-01-24: 270 mg via INTRAVENOUS
  Filled 2013-01-24: qty 13.5

## 2013-01-24 MED ORDER — FLUOROURACIL CHEMO INJECTION 2.5 GM/50ML
300.0000 mg/m2 | Freq: Once | INTRAVENOUS | Status: AC
  Administered 2013-01-24: 650 mg via INTRAVENOUS
  Filled 2013-01-24: qty 13

## 2013-01-24 MED ORDER — ATROPINE SULFATE 1 MG/ML IJ SOLN
0.5000 mg | Freq: Once | INTRAMUSCULAR | Status: AC | PRN
  Administered 2013-01-24: 0.5 mg via INTRAVENOUS

## 2013-01-24 MED ORDER — DEXAMETHASONE SODIUM PHOSPHATE 20 MG/5ML IJ SOLN
20.0000 mg | Freq: Once | INTRAMUSCULAR | Status: AC
  Administered 2013-01-24: 20 mg via INTRAVENOUS

## 2013-01-24 MED ORDER — SODIUM CHLORIDE 0.9 % IV SOLN
1800.0000 mg/m2 | INTRAVENOUS | Status: DC
  Administered 2013-01-24: 3900 mg via INTRAVENOUS
  Filled 2013-01-24: qty 78

## 2013-01-24 MED ORDER — ONDANSETRON 16 MG/50ML IVPB (CHCC)
16.0000 mg | Freq: Once | INTRAVENOUS | Status: AC
  Administered 2013-01-24: 16 mg via INTRAVENOUS

## 2013-01-24 NOTE — Patient Instructions (Addendum)
[facility] Discharge Instructions for Patients Receiving Chemotherapy  Today you received the following chemotherapy agents CAMPTOSAR, LEUCOVORIN, 5FU push, 5FU pump  To help prevent nausea and vomiting after your treatment, we encourage you to take your nausea medication if needed. May start about 5 pm.   If you develop nausea and vomiting that is not controlled by your nausea medication, call the clinic.   BELOW ARE SYMPTOMS THAT SHOULD BE REPORTED IMMEDIATELY:  *FEVER GREATER THAN 100.5 F  *CHILLS WITH OR WITHOUT FEVER  NAUSEA AND VOMITING THAT IS NOT CONTROLLED WITH YOUR NAUSEA MEDICATION  *UNUSUAL SHORTNESS OF BREATH  *UNUSUAL BRUISING OR BLEEDING  TENDERNESS IN MOUTH AND THROAT WITH OR WITHOUT PRESENCE OF ULCERS  *URINARY PROBLEMS  *BOWEL PROBLEMS  UNUSUAL RASH Items with * indicate a potential emergency and should be followed up as soon as possible.  Feel free to call the clinic you have any questions or concerns. The clinic phone number is [phone].

## 2013-01-24 NOTE — Progress Notes (Signed)
Hematology and Oncology Follow Up Visit  Todd [name] [id number] 07/09/1930 79 y.o. 01/24/2013 12:27 PM Todd [name], MDBreen, Todd Lamas, MD   Principle Diagnosis: 79 year old with stage IV colon cancer diagnosed in 08/2012. He presented with a colonic mass and liver lesions.  KRAS wild type.   Prior Therapy: He is S/P LAPAROSCOPIC ASSISTED SIGMOID COLON RESECTION, END Left OSTOMY, Hartmann's pouch and acement of power port done on 09/07/2012.   Current therapy:  He started FOLFIRI and Avastin on 3/26. Avastin was stopped after 4 cycles due to epistaxis. He was rechallenged with Avastin with cycle 7. Here for cycle 8 today.  Interim History: Mr. [name] presents today for a follow up visit. He is a very nice man with stage IV colon cancer. He under went surgical resection on 09/07/2012 and has been doing well chemotherapy in general. He has received first 7 cycles of chemotherapy with little complications. He again developed worsening epistaxis with the rechallenge of Avastin with cycle 7. He was seen by ENT who cauterized his nares. The patient prefers not to have any further Avastin. Had mild diarrhea for about 1-2 days and is using Imodium alternating with Lomotil. No longer having issues with constipation. No chest pain, shortness of breath, abdominal pain, nausea, or vomiting. Appetite and weight stable. Overall, continues to have good quality of life.  Medications: I have reviewed the patient's current medications.  Current Outpatient Prescriptions  Medication Sig Dispense Refill  . aspirin 81 MG tablet Take 81 mg by mouth daily.        . benazepril (LOTENSIN) 10 MG tablet Take 1 tablet (10 mg total) by mouth daily.  90 tablet  3  . chlorhexidine (PERIDEX) 0.12 % solution Use as directed 15 mLs in the mouth or throat 2 (two) times daily. Use as directed for mouth irritation      . colchicine 0.6 MG tablet Take 0.6 mg by mouth daily as needed (for gout flares).      . diclofenac (VOLTAREN) 75  MG EC tablet Take 1 tablet (75 mg total) by mouth 2 (two) times daily as needed.  180 tablet  0  . diphenoxylate-atropine (LOMOTIL) 2.5-0.025 MG per tablet Take 2 tablets by mouth 4 (four) times daily as needed for diarrhea or loose stools.  60 tablet  1  . doxazosin (CARDURA) 8 MG tablet Take 8 mg by mouth daily.       . feeding supplement (ENSURE COMPLETE) LIQD Take 237 mLs by mouth 2 (two) times daily between meals.  60 Bottle  2  . gabapentin (NEURONTIN) 300 MG capsule Take 300 mg by mouth as needed.      . hydrochlorothiazide (HYDRODIURIL) 25 MG tablet Take 1 tablet (25 mg total) by mouth daily.  90 tablet  3  . isosorbide mononitrate (IMDUR) 60 MG 24 hr tablet Take 1 tablet (60 mg total) by mouth daily.  90 tablet  3  . lidocaine-prilocaine (EMLA) cream Apply topically as needed. Prior to chemotherapy treatment, Apply approx 1/2 teaspoon to skin over port, do not rub in. Cover with plastic,  30 g  1  . loratadine (CLARITIN) 10 MG tablet Take 10 mg by mouth daily.        . metoprolol succinate (TOPROL-XL) 50 MG 24 hr tablet Take 50 mg by mouth daily. Take with or immediately following a meal.      . Multiple Vitamin (MULTIVITAMIN) tablet Take 1 tablet by mouth daily.        Marland Kitchen  nitroGLYCERIN (NITROSTAT) 0.4 MG SL tablet Place 0.4 mg under the tongue every 5 (five) minutes as needed.        . nystatin cream (MYCOSTATIN) Apply topically 2 (two) times daily.  30 g  2  . Polyethyl Glycol-Propyl Glycol (SYSTANE OP) Apply 1 drop to eye daily. 1 or 2 drops in the affected eye as needed      . prochlorperazine (COMPAZINE) 10 MG tablet Take 1 tablet (10 mg total) by mouth every 6 (six) hours as needed.  30 tablet  1  . simvastatin (ZOCOR) 40 MG tablet Take 1 tablet (40 mg total) by mouth at bedtime.  90 tablet  3  . zoster vaccine live, PF, (ZOSTAVAX) 78469 UNT/0.65ML injection Inject into the skin once. Dispense to patient to be admin in doctors office        No current facility-administered medications  for this visit.   Facility-Administered Medications Ordered in Other Visits  Medication Dose Route Frequency Provider Last Rate Last Dose  . fluorouracil (ADRUCIL) 3,900 mg in sodium chloride 0.9 % 150 mL chemo infusion  1,800 mg/m2 (Treatment Plan Actual) Intravenous 1 day or 1 dose Benjiman Core, MD      . fluorouracil (ADRUCIL) chemo injection 650 mg  300 mg/m2 (Treatment Plan Actual) Intravenous Once Benjiman Core, MD      . irinotecan (CAMPTOSAR) 270 mg in dextrose 5 % 500 mL chemo infusion  125 mg/m2 (Treatment Plan Actual) Intravenous Once Benjiman Core, MD 342 mL/hr at 01/24/13 1145 270 mg at 01/24/13 1145  . leucovorin 650 mg in dextrose 5 % 250 mL infusion  301 mg/m2 (Order-Specific) Intravenous Once Benjiman Core, MD 188 mL/hr at 01/24/13 1145 650 mg at 01/24/13 1145    Allergies: No Known Allergies  Past Medical History, Surgical history, Social history, and Family History were reviewed and updated.  Review of Systems: Constitutional:  Negative for fever, chills, night sweats, anorexia, weight loss, pain. Cardiovascular: no chest pain or dyspnea on exertion Respiratory: negative Neurological: negative Dermatological: negative ENT: negative Skin: Negative. Gastrointestinal: negative Genito-Urinary: negative Hematological and Lymphatic:  Breast: negative Musculoskeletal: negative Remaining ROS negative.  Physical Exam: Blood pressure 118/61, pulse 57, temperature 97.5 F (36.4 C), temperature source Oral, resp. rate 18, height 5\' 9"  (1.753 m), weight 197 lb 8 oz (89.585 kg). ECOG: 1 General appearance: alert Head: Normocephalic, without obvious abnormality, atraumatic Neck: no adenopathy, no carotid bruit, no JVD, supple, symmetrical, trachea midline and thyroid not enlarged, symmetric, no tenderness/mass/nodules Lymph nodes: Cervical, supraclavicular, and axillary nodes normal. Heart:regular rate and rhythm, S1, S2 normal, no murmur, click, rub or  gallop Lung:chest clear, no wheezing, rales, normal symmetric air entry Abdomen: soft, non-tender, without masses or organomegaly EXT:no erythema, induration, or nodules Skin: Red excoriated area to lower abdomen. Skin is macerated under dressing.   Lab Results: Lab Results  Component Value Date   WBC 6.3 01/24/2013   HGB 11.0* 01/24/2013   HCT 32.1* 01/24/2013   MCV 89.9 01/24/2013   PLT 120* 01/24/2013     Chemistry      Component Value Date/Time   NA 138 01/24/2013 0828   NA 135 09/13/2012 0510   K 4.3 01/24/2013 0828   K 3.6 09/13/2012 0510   CL 105 01/10/2013 0831   CL 100 09/13/2012 0510   CO2 26 01/24/2013 0828   CO2 28 09/13/2012 0510   BUN 20.4 01/24/2013 0828   BUN 16 09/13/2012 0510   CREATININE 0.9 01/24/2013 6295  CREATININE 0.70 09/13/2012 0510   CREATININE 0.76 06/27/2012 0856      Component Value Date/Time   CALCIUM 9.1 01/24/2013 0828   CALCIUM 8.8 09/13/2012 0510   ALKPHOS 40 01/24/2013 0828   ALKPHOS 39 01/15/2013 0935   AST 25 01/24/2013 0828   AST 17 01/15/2013 0935   ALT 19 01/24/2013 0828   ALT 14 01/15/2013 0935   BILITOT 0.87 01/24/2013 0828   BILITOT 1.6* 01/15/2013 0935      Impression and Plan:  79 year old man with :  1. Stage IV colon cancer. He is S/P sigmoid colon resection with a bulky tumor in the liver. On palliative chemotherapy with FOLFIRI/Avastin. He is S/P cycle 7 cycles. CT scan results from 5/19 (after 4 cycles) showed about 25% reduction of his tumor burden. Recommend that he proceed with cycle 8 of chemotherapy today. We will again DC the Avastin due to epistaxis. We will repeat CT scan after cycle 8.   2. Port management. Port in place, no flush will be needed.   3. Candidiasis to lower abdomen.Improved now.  4. Epistaxis: Worsened with the rechallenge of Avastin. We will DC the Avastin. The patient is in agreement with this plan. Follow up with ENT as needed.  5. Diarrhea. Due to Irinotecan. Recommend that he continue Imodium and Lomotil.   6. Follow  up: in 2 weeks for cycle 9.    Clenton Pare 7/2/201412:27 PM

## 2013-01-25 ENCOUNTER — Telehealth: Payer: Self-pay | Admitting: *Deleted

## 2013-01-25 ENCOUNTER — Ambulatory Visit (HOSPITAL_BASED_OUTPATIENT_CLINIC_OR_DEPARTMENT_OTHER): Payer: Medicare Other

## 2013-01-25 VITALS — BP 119/58 | HR 50 | Temp 97.9°F | Resp 20

## 2013-01-25 DIAGNOSIS — Z452 Encounter for adjustment and management of vascular access device: Secondary | ICD-10-CM

## 2013-01-25 DIAGNOSIS — C787 Secondary malignant neoplasm of liver and intrahepatic bile duct: Secondary | ICD-10-CM

## 2013-01-25 DIAGNOSIS — C187 Malignant neoplasm of sigmoid colon: Secondary | ICD-10-CM

## 2013-01-25 MED ORDER — SODIUM CHLORIDE 0.9 % IJ SOLN
10.0000 mL | INTRAMUSCULAR | Status: DC | PRN
  Administered 2013-01-25: 10 mL via INTRAVENOUS
  Filled 2013-01-25: qty 10

## 2013-01-25 MED ORDER — HEPARIN SOD (PORK) LOCK FLUSH 100 UNIT/ML IV SOLN
500.0000 [IU] | Freq: Once | INTRAVENOUS | Status: AC
  Administered 2013-01-25: 500 [IU] via INTRAVENOUS
  Filled 2013-01-25: qty 5

## 2013-01-25 NOTE — Telephone Encounter (Signed)
Per staff message and POF I have scheduled appts.  JMW  

## 2013-01-26 ENCOUNTER — Other Ambulatory Visit: Payer: Self-pay | Admitting: Family Medicine

## 2013-01-31 ENCOUNTER — Other Ambulatory Visit: Payer: Self-pay | Admitting: Family Medicine

## 2013-02-01 ENCOUNTER — Other Ambulatory Visit: Payer: Self-pay

## 2013-02-05 ENCOUNTER — Encounter (HOSPITAL_COMMUNITY): Payer: Self-pay

## 2013-02-05 ENCOUNTER — Ambulatory Visit (HOSPITAL_COMMUNITY)
Admission: RE | Admit: 2013-02-05 | Discharge: 2013-02-05 | Disposition: A | Discharge status: Home / Self Care | Payer: Medicare Other | Source: Ambulatory Visit | Attending: Oncology | Admitting: Oncology

## 2013-02-05 DIAGNOSIS — C189 Malignant neoplasm of colon, unspecified: Secondary | ICD-10-CM

## 2013-02-05 DIAGNOSIS — IMO0002 Reserved for concepts with insufficient information to code with codable children: Secondary | ICD-10-CM | POA: Insufficient documentation

## 2013-02-05 DIAGNOSIS — N281 Cyst of kidney, acquired: Secondary | ICD-10-CM | POA: Insufficient documentation

## 2013-02-05 DIAGNOSIS — I2510 Atherosclerotic heart disease of native coronary artery without angina pectoris: Secondary | ICD-10-CM | POA: Insufficient documentation

## 2013-02-05 DIAGNOSIS — D3500 Benign neoplasm of unspecified adrenal gland: Secondary | ICD-10-CM | POA: Insufficient documentation

## 2013-02-05 DIAGNOSIS — C787 Secondary malignant neoplasm of liver and intrahepatic bile duct: Secondary | ICD-10-CM | POA: Insufficient documentation

## 2013-02-05 MED ORDER — IOHEXOL 300 MG/ML  SOLN
100.0000 mL | Freq: Once | INTRAMUSCULAR | Status: AC | PRN
  Administered 2013-02-05: 100 mL via INTRAVENOUS

## 2013-02-06 ENCOUNTER — Telehealth: Payer: Self-pay | Admitting: Family Medicine

## 2013-02-06 NOTE — Telephone Encounter (Signed)
Will fwd to Dr. [name].  Arora Coakley L, CMA  

## 2013-02-06 NOTE — Telephone Encounter (Signed)
Called patient back to cell phone.  Discussed the reading on the CTs from July 14th.  He has appt with oncology tomorrow.  Diarrhea, more hoarse than before.  Is interested in taking a break from the treatment given the side effects.  Debilitating for him and for his wife.  He has appt with oncology tomorrow to address these concerns.  JB

## 2013-02-06 NOTE — Telephone Encounter (Signed)
Pt is calling for Dr. Mauricio Po to see if has had a chance to look at his CT results that were done on 7/14. He would like Dr. Mauricio Po to call him back with the results of CT and what the next steps should be. He will be at home for the next hour and after that use cell phone [phone]. JW

## 2013-02-07 ENCOUNTER — Ambulatory Visit (HOSPITAL_BASED_OUTPATIENT_CLINIC_OR_DEPARTMENT_OTHER): Payer: Medicare Other | Admitting: Oncology

## 2013-02-07 ENCOUNTER — Telehealth: Payer: Self-pay | Admitting: Oncology

## 2013-02-07 ENCOUNTER — Other Ambulatory Visit (HOSPITAL_BASED_OUTPATIENT_CLINIC_OR_DEPARTMENT_OTHER): Payer: Medicare Other

## 2013-02-07 ENCOUNTER — Encounter (INDEPENDENT_AMBULATORY_CARE_PROVIDER_SITE_OTHER): Payer: Medicare Other | Admitting: Surgery

## 2013-02-07 ENCOUNTER — Inpatient Hospital Stay: Payer: Medicare Other

## 2013-02-07 VITALS — BP 128/70 | HR 57 | Temp 97.3°F | Resp 18 | Ht 69.0 in | Wt 203.8 lb

## 2013-02-07 DIAGNOSIS — C189 Malignant neoplasm of colon, unspecified: Secondary | ICD-10-CM

## 2013-02-07 DIAGNOSIS — C787 Secondary malignant neoplasm of liver and intrahepatic bile duct: Secondary | ICD-10-CM

## 2013-02-07 LAB — CBC WITH DIFFERENTIAL/PLATELET
Basophils Absolute: 0.1 10*3/uL (ref 0.0–0.1)
EOS%: 5.3 % (ref 0.0–7.0)
HCT: 31.7 % — ABNORMAL LOW (ref 38.4–49.9)
HGB: 10.8 g/dL — ABNORMAL LOW (ref 13.0–17.1)
LYMPH%: 18.6 % (ref 14.0–49.0)
MCH: 31 pg (ref 27.2–33.4)
MCV: 91.1 fL (ref 79.3–98.0)
MONO%: 8.2 % (ref 0.0–14.0)
NEUT%: 67.2 % (ref 39.0–75.0)

## 2013-02-07 NOTE — Telephone Encounter (Signed)
Gave pt appt for lab and MD on September 2014 °

## 2013-02-07 NOTE — Progress Notes (Signed)
Hematology and Oncology Follow Up Visit  Todd [name] [id number] 01-Jul-1930 79 y.o. 02/07/2013 9:51 AM Todd [name], MDBreen, Todd Lamas, MD   Principle Diagnosis: 79 year old with stage IV colon cancer diagnosed in 08/2012. He presented with a colonic mass and liver lesions.  KRAS wild type.   Prior Therapy: He is S/P LAPAROSCOPIC ASSISTED SIGMOID COLON RESECTION, END Left OSTOMY, Hartmann's pouch and acement of power port done on 09/07/2012.   Current therapy:  He started FOLFIRI and Avastin on 3/26. Avastin was stopped after 4 cycles due to epistaxis. He is S/P 8 cycles of treatment last one given on 01/24/2013.  Interim History: Todd [name] presents today for a follow up visit. He is a very nice man with stage IV colon cancer. He under went surgical resection on 09/07/2012 and has been doing well chemotherapy in general. He has received first 8 cycles of chemotherapy with little complications. He again developed worsening epistaxis with the rechallenge of Avastin with cycle 7. Since his last visit, he has done poorly. He is has more diarrhea for about 1-2 days and is using Imodium alternating with Lomotil. No longer having issues with constipation. He is more fatigued at this time. No chest pain, shortness of breath, abdominal pain, nausea, or vomiting. Appetite and weight is slightly down for the first time in a while.   Medications: I have reviewed the patient's current medications.  Current Outpatient Prescriptions  Medication Sig Dispense Refill  . aspirin 81 MG tablet Take 81 mg by mouth daily.        . benazepril (LOTENSIN) 10 MG tablet Take 1 tablet (10 mg total) by mouth daily.  90 tablet  3  . chlorhexidine (PERIDEX) 0.12 % solution Use as directed 15 mLs in the mouth or throat 2 (two) times daily. Use as directed for mouth irritation      . colchicine 0.6 MG tablet Take 0.6 mg by mouth daily as needed (for gout flares).      . diclofenac (VOLTAREN) 75 MG EC tablet Take 1 tablet (75 mg  total) by mouth 2 (two) times daily as needed.  180 tablet  0  . diphenoxylate-atropine (LOMOTIL) 2.5-0.025 MG per tablet Take 2 tablets by mouth 4 (four) times daily as needed for diarrhea or loose stools.  60 tablet  1  . doxazosin (CARDURA) 8 MG tablet Take 8 mg by mouth daily.       . feeding supplement (ENSURE COMPLETE) LIQD Take 237 mLs by mouth 2 (two) times daily between meals.  60 Bottle  2  . gabapentin (NEURONTIN) 300 MG capsule Take 300 mg by mouth as needed.      . hydrochlorothiazide (HYDRODIURIL) 25 MG tablet Take 1 tablet (25 mg total) by mouth daily.  90 tablet  3  . isosorbide mononitrate (IMDUR) 60 MG 24 hr tablet TAKE 1 TABLET BY MOUTH EVERY DAY  90 tablet  0  . lidocaine-prilocaine (EMLA) cream Apply topically as needed. Prior to chemotherapy treatment, Apply approx 1/2 teaspoon to skin over port, do not rub in. Cover with plastic,  30 g  1  . loratadine (CLARITIN) 10 MG tablet Take 10 mg by mouth daily.        . metoprolol succinate (TOPROL-XL) 50 MG 24 hr tablet Take 50 mg by mouth daily. Take with or immediately following a meal.      . Multiple Vitamin (MULTIVITAMIN) tablet Take 1 tablet by mouth daily.        . nitroGLYCERIN (  NITROSTAT) 0.4 MG SL tablet Place 0.4 mg under the tongue every 5 (five) minutes as needed.        . nystatin cream (MYCOSTATIN) Apply topically 2 (two) times daily.  30 g  2  . Polyethyl Glycol-Propyl Glycol (SYSTANE OP) Apply 1 drop to eye daily. 1 or 2 drops in the affected eye as needed      . prochlorperazine (COMPAZINE) 10 MG tablet Take 1 tablet (10 mg total) by mouth every 6 (six) hours as needed.  30 tablet  1  . simvastatin (ZOCOR) 40 MG tablet TAKE 1 TABLET BY MOUTH AT BEDTIME  90 tablet  0  . zoster vaccine live, PF, (ZOSTAVAX) 45409 UNT/0.65ML injection Inject into the skin once. Dispense to patient to be admin in doctors office        No current facility-administered medications for this visit.    Allergies: No Known Allergies  Past  Medical History, Surgical history, Social history, and Family History were reviewed and updated.  Review of Systems: Constitutional:  Negative for fever, chills, night sweats, anorexia, weight loss, pain. Cardiovascular: no chest pain or dyspnea on exertion Respiratory: negative Neurological: negative Dermatological: negative ENT: negative Skin: Negative. Gastrointestinal: negative Genito-Urinary: negative Hematological and Lymphatic:  Breast: negative Musculoskeletal: negative Remaining ROS negative.  Physical Exam: Blood pressure 128/70, pulse 57, temperature 97.3 F (36.3 C), temperature source Oral, resp. rate 18, height 5\' 9"  (1.753 m), weight 203 lb 12.8 oz (92.443 kg). ECOG: 1 General appearance: alert Head: Normocephalic, without obvious abnormality, atraumatic Neck: no adenopathy, no carotid bruit, no JVD, supple, symmetrical, trachea midline and thyroid not enlarged, symmetric, no tenderness/mass/nodules Lymph nodes: Cervical, supraclavicular, and axillary nodes normal. Heart:regular rate and rhythm, S1, S2 normal, no murmur, click, rub or gallop Lung:chest clear, no wheezing, rales, normal symmetric air entry Abdomen: soft, non-tender, without masses or organomegaly EXT:no erythema, induration, or nodules Skin: Red excoriated area to lower abdomen. Skin is macerated under dressing.   Lab Results: Lab Results  Component Value Date   WBC 7.3 02/07/2013   HGB 10.8* 02/07/2013   HCT 31.7* 02/07/2013   MCV 91.1 02/07/2013   PLT 134* 02/07/2013     Chemistry      Component Value Date/Time   NA 138 01/24/2013 0828   NA 135 09/13/2012 0510   K 4.3 01/24/2013 0828   K 3.6 09/13/2012 0510   CL 105 01/10/2013 0831   CL 100 09/13/2012 0510   CO2 26 01/24/2013 0828   CO2 28 09/13/2012 0510   BUN 20.4 01/24/2013 0828   BUN 16 09/13/2012 0510   CREATININE 0.9 01/24/2013 0828   CREATININE 0.70 09/13/2012 0510   CREATININE 0.76 06/27/2012 0856      Component Value Date/Time   CALCIUM  9.1 01/24/2013 0828   CALCIUM 8.8 09/13/2012 0510   ALKPHOS 40 01/24/2013 0828   ALKPHOS 39 01/15/2013 0935   AST 25 01/24/2013 0828   AST 17 01/15/2013 0935   ALT 19 01/24/2013 0828   ALT 14 01/15/2013 0935   BILITOT 0.87 01/24/2013 0828   BILITOT 1.6* 01/15/2013 0935     CT CHEST, ABDOMEN AND PELVIS WITH CONTRAST  Technique: Multidetector CT imaging of the chest, abdomen and  pelvis was performed following the standard protocol during bolus  administration of intravenous contrast.  Contrast: OMNIPAQUE IOHEXOL 300 MG/ML SOLN  Comparison: 12/11/2012  CT CHEST  Findings: The tip of the right-sided Port-A-Cath is positioned at  the distal SVC level. There is no  axillary lymphadenopathy. No  mediastinal or hilar lymphadenopathy. Heart size is borderline  enlarged. Coronary artery calcification is noted. No pericardial  or pleural effusion.  Bone windows show some persistent probable interstitial scarring in  the lower lingula. There is collapse / consolidation in the  posterior left lower lobe, unchanged.  IMPRESSION:  Stable exam. No new or progressive findings to suggest metastatic  disease.  Stable appearance of collapse / consolidation in the posterior left  lower lobe. This may well reflect post infectious/postinflammatory  scarring.  CT ABDOMEN AND PELVIS  Findings: Numerous low-density lesions are seen scattered through  the liver. A dominant posterior right hepatic dome lesion measures  9.2 x 7.1 cm today compared to 9.8 x 7.6 cm previously. A second  index lesion in the anterior hepatic dome is 5.4 x 4.0 cm today  compared to 6.0 x 4.7 cm previously.  No focal abnormality in the spleen. The stomach, duodenum,  pancreas, gallbladder, and left adrenal gland are unremarkable.  2.5 cm right adrenal nodule has attenuation low enough to allow  classification as a benign adrenal adenoma. Small cortical cyst  noted in the right kidney. The dominant lower pole exophytic  lesion in the  left kidney is stable at 10 x 9.3 cm compared 9.8 x  9.2 cm previously. This may have a thin septation along the  cranial margin with some subtle calcification.  No abdominal aortic aneurysm. There is no free fluid or  lymphadenopathy in the abdomen. A left abdominal sigmoid colostomy  is evident with parastomal herniation of small bowel. No evidence  for bowel dilatation or fluid within the hernia sac to suggest  complication.  Imaging through the pelvis shows no free intraperitoneal fluid. No  pelvic sidewall lymphadenopathy. Prostate gland is markedly  enlarged. Bladder is unremarkable. Hartmann's pouch is  identified. Terminal ileum is normal. The appendix is not  visualized, but there is no edema or inflammation in the region of  the cecum. Bone windows reveal no worrisome lytic or sclerotic  osseous lesions.  IMPRESSION:  Continued interval decrease in size of multiple hepatic metastases.  No evidence for new or progressive disease in the abdomen or  pelvis.  Stable appearance of right adrenal adenoma.  Prostatomegaly.  Impression and Plan:  79 year old man with :  1. Stage IV colon cancer. He is S/P sigmoid colon resection with a bulky tumor in the liver. On palliative chemotherapy with FOLFIRI/Avastin. He is S/P cycle 8 cycles. CT scan results from 7/14 (after 8 cycles) showed slight decline in tumor burden. Based on these findings and his overall health, we elected to give him a treatment holiday till 9/3 and will evaluate him then.   2. Port management. Port in place, no flush will be needed till 9/3.   3. Candidiasis to lower abdomen.Improved now.  4. Epistaxis: improved off Avastin.   5. Diarrhea. Due to Irinotecan. Recommend that he continue Imodium and Lomotil.   6. Follow up: on 9/3.   Lexie Morini 7/16/20149:51 AM

## 2013-02-09 ENCOUNTER — Ambulatory Visit (INDEPENDENT_AMBULATORY_CARE_PROVIDER_SITE_OTHER): Payer: Medicare Other | Admitting: Surgery

## 2013-02-09 ENCOUNTER — Encounter (INDEPENDENT_AMBULATORY_CARE_PROVIDER_SITE_OTHER): Payer: Self-pay | Admitting: Surgery

## 2013-02-09 VITALS — BP 132/74 | HR 60 | Temp 97.1°F | Resp 18 | Ht 70.0 in | Wt 206.0 lb

## 2013-02-09 DIAGNOSIS — C189 Malignant neoplasm of colon, unspecified: Secondary | ICD-10-CM

## 2013-02-09 DIAGNOSIS — C787 Secondary malignant neoplasm of liver and intrahepatic bile duct: Secondary | ICD-10-CM

## 2013-02-09 NOTE — Progress Notes (Addendum)
[facility]  Ovidio Kin, MD,  FACS [address] [place]  [address] [place], [place]    [address] Phone:  [phone] FAX:  [phone]   Re:   Todd [name] DOB:   02-Jun-1930 MRN:   [id number]  ASSESSMENT AND PLAN: 1. Obstructing sigmoid colon cancer.   T4b, N2a, M1.  A.  LAPAROSCOPIC ASSISTED SIGMOID COLON RESECTION, END Left OSTOMY, Hartmann's pouch, Placement of power port - D. Lalitha Ilyas - 09/07/2012   B.  Improvement of metastatic liver disease on chemotx.  His pre op CEA was 59, his last CEA on 01/24/2013 was 1.0.   He is on a chemo holiday for a couple of months.  He is doing well from a surgery standpoint and I made this his last visit with me.  He knows to call for any problems or concerns.  [Talked to patient on the phone.  He has been sick with pneumonia, complications with his kidneys, and his congestive heart failure.  He was in the hospital 5 days.  His last CT scan 11//05/2013 showed decrease in liver met size.  He had called about some discharge from his rectum.  He said that it looks like bloody mucous.  I expect that it is sloughing from his rectal stump, somewhat accelerated by the chemotx.  We will not set and appointment, but he will call if I can help.  DN 09/06/2013]  2.  Metastatic disease to liver.   Sees Dr. Earl Gala   Received 8 cycles of FOLFIRI.   He had trouble with avastin causing nose bleeds.  It is bette now that he is off the Avastin, but he still gets "crusting" in his nose.  3.  BPH - seen by Dr. Laverle Patter in the hospital for traumatic hematuria. 4.  Diabetes mellitus  5.  Cardiomyopathy with CAD   Distant h/o anterior MI with 100% occluded LAD & associated Ischemic Cardiomyopathy EF ~35% with chronic mild dependent edema - followed by Dr. Ranae Palms  6.  History of gout left ankle. 7.  Parastomal hernia  This is fairly small and will observe this for now. 8.  Power port - placed 09/07/2012 - D. Ezzard Standing  He will check about getting this  flushed regularly   HISTORY OF PRESENT ILLNESS: Chief Complaint  Patient presents with  . Follow-up    colon ca    Todd [name] is a 79 y.o. (DOB: 1930/02/29)  white  male who is a patient of [name],JAMES O, MD and comes to me today for follow up of a diverting colostomy for an obstructing colon cancer.  He is in good spirits.  His wife is with him.  He has done well with the chemotx so far and has had some improvement in his liver mets.  He had some diarrhea with the chemo.  But that is better.  He is on a chemo "holiday" for a couple of months to catch his breath. He had concerns about: 1) rash and leakage around the ostomy.  He is going to get in touch with the [facility] ostomy nurses.  His wife has used the paste before, so she may want to start that.  2) he has some bulging from his ostomy.  He does have a parastomal hernia on PE and CT scan.  But there is not much to do about that right now.  3) we talked about flushing the power port - they will check with the cancer center.  CT scan abdomen -  02/05/2013 - Continued interval decrease in size of multiple hepatic metastases.   Past Medical History  Diagnosis Date  . Myocardial infarction 1990    "Massive"- total LAD with collaterals  . Gout   . Hypertension   . Arthritis, rheumatoid   . Diabetes mellitus without complication   . CHF (congestive heart failure)   . BPH (benign prostatic hyperplasia)   . Neuropathy   . Hyperlipidemia   . Ischemic cardiomyopathy March 2012    EF 31% Myoview. Pt declines ICD  . Colon cancer 2014    Surg, chemo March 2014   Current Outpatient Prescriptions  Medication Sig Dispense Refill  . aspirin 81 MG tablet Take 81 mg by mouth daily.        . benazepril (LOTENSIN) 10 MG tablet Take 1 tablet (10 mg total) by mouth daily.  90 tablet  3  . chlorhexidine (PERIDEX) 0.12 % solution Use as directed 15 mLs in the mouth or throat 2 (two) times daily. Use as directed for mouth irritation      . colchicine 0.6  MG tablet Take 0.6 mg by mouth daily as needed (for gout flares).      . diclofenac (VOLTAREN) 75 MG EC tablet Take 1 tablet (75 mg total) by mouth 2 (two) times daily as needed.  180 tablet  0  . diphenoxylate-atropine (LOMOTIL) 2.5-0.025 MG per tablet Take 2 tablets by mouth 4 (four) times daily as needed for diarrhea or loose stools.  60 tablet  1  . doxazosin (CARDURA) 8 MG tablet Take 8 mg by mouth daily.       . feeding supplement (ENSURE COMPLETE) LIQD Take 237 mLs by mouth 2 (two) times daily between meals.  60 Bottle  2  . gabapentin (NEURONTIN) 300 MG capsule Take 300 mg by mouth as needed.      . hydrochlorothiazide (HYDRODIURIL) 25 MG tablet Take 1 tablet (25 mg total) by mouth daily.  90 tablet  3  . isosorbide mononitrate (IMDUR) 60 MG 24 hr tablet TAKE 1 TABLET BY MOUTH EVERY DAY  90 tablet  0  . lidocaine-prilocaine (EMLA) cream Apply topically as needed. Prior to chemotherapy treatment, Apply approx 1/2 teaspoon to skin over port, do not rub in. Cover with plastic,  30 g  1  . loratadine (CLARITIN) 10 MG tablet Take 10 mg by mouth daily.        . metoprolol succinate (TOPROL-XL) 50 MG 24 hr tablet Take 50 mg by mouth daily. Take with or immediately following a meal.      . Multiple Vitamin (MULTIVITAMIN) tablet Take 1 tablet by mouth daily.        . nitroGLYCERIN (NITROSTAT) 0.4 MG SL tablet Place 0.4 mg under the tongue every 5 (five) minutes as needed.        . nystatin cream (MYCOSTATIN) Apply topically 2 (two) times daily.  30 g  2  . Polyethyl Glycol-Propyl Glycol (SYSTANE OP) Apply 1 drop to eye daily. 1 or 2 drops in the affected eye as needed      . prochlorperazine (COMPAZINE) 10 MG tablet Take 1 tablet (10 mg total) by mouth every 6 (six) hours as needed.  30 tablet  1  . simvastatin (ZOCOR) 40 MG tablet TAKE 1 TABLET BY MOUTH AT BEDTIME  90 tablet  0  . zoster vaccine live, PF, (ZOSTAVAX) 08657 UNT/0.65ML injection Inject into the skin once. Dispense to patient to be  admin in doctors office  No current facility-administered medications for this visit.   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 Wife with patient.  PHYSICAL EXAM: BP 132/74  Pulse 60  Temp(Src) 97.1 F (36.2 C)  Resp 18  Ht 5\' 10"  (1.778 m)  Wt 206 lb (93.441 kg)  BMI 29.56 kg/m2  General: WN older WM who is alert .  He looks good today. HEENT: Normal. Pupils equal.  Lungs: Clear to auscultation and symmetric breath sounds. Heart:  RRR. No murmur or rub. Abdomen: Soft.  No tenderness. No hernia. Normal bowel sounds.  Left upper quadrant ostomy.  Has some bulging around the ostomy.  Probable parastomal hernia.  He has a rash below the ostomy that his wife is treating and they say is getting better.   Midline wound  look good.  DATA REVIEWED: Epic records.  Dr. Wynonia Musty notes.  Ovidio Kin, MD, FACS Office:  [phone]

## 2013-02-12 ENCOUNTER — Telehealth: Payer: Self-pay | Admitting: Hematology & Oncology

## 2013-02-12 ENCOUNTER — Telehealth: Payer: Self-pay | Admitting: *Deleted

## 2013-02-12 NOTE — Telephone Encounter (Signed)
Pt aware of 8-20 flush

## 2013-02-12 NOTE — Telephone Encounter (Signed)
Patient calling to say that he saw dr Ezzard Standing, who was concerned that his port flush was over 8 weeks away.  Put pof in for port flush mid-august. Note to dr Alver Fisher desk.Marland Kitchen

## 2013-02-22 ENCOUNTER — Encounter: Payer: Self-pay | Admitting: *Deleted

## 2013-02-28 ENCOUNTER — Other Ambulatory Visit: Payer: Self-pay

## 2013-03-06 ENCOUNTER — Other Ambulatory Visit: Payer: Self-pay | Admitting: Family Medicine

## 2013-03-09 ENCOUNTER — Encounter: Payer: Self-pay | Admitting: Family Medicine

## 2013-03-09 ENCOUNTER — Other Ambulatory Visit: Payer: Self-pay | Admitting: Family Medicine

## 2013-03-09 ENCOUNTER — Ambulatory Visit (INDEPENDENT_AMBULATORY_CARE_PROVIDER_SITE_OTHER): Payer: Medicare Other | Admitting: Family Medicine

## 2013-03-09 ENCOUNTER — Ambulatory Visit
Admission: RE | Admit: 2013-03-09 | Discharge: 2013-03-09 | Disposition: A | Discharge status: Home / Self Care | Payer: Medicare Other | Source: Ambulatory Visit | Attending: Family Medicine | Admitting: Family Medicine

## 2013-03-09 VITALS — BP 133/60 | HR 60 | Temp 98.0°F | Ht 70.0 in | Wt 209.0 lb

## 2013-03-09 DIAGNOSIS — R0600 Dyspnea, unspecified: Secondary | ICD-10-CM

## 2013-03-09 DIAGNOSIS — R040 Epistaxis: Secondary | ICD-10-CM

## 2013-03-09 DIAGNOSIS — R0609 Other forms of dyspnea: Secondary | ICD-10-CM

## 2013-03-09 MED ORDER — FUROSEMIDE 40 MG PO TABS: 40.0000 mg | ORAL_TABLET | Freq: Every day | ORAL | Status: DC

## 2013-03-09 NOTE — Assessment & Plan Note (Signed)
Patient with acute complaint of dyspnea and worsening leg swelling; of note, he has extensive CAD with systolic heart failure (EF on ECHO October 17, 2012 with LVEF 35-40% with grade II diastolic dysfunction).   Less likely PE or PNA in this clinical picture; cannot exclude presence of effusion.  Will get CXR today, I will call him with results. To diurese empirically with Lasix 40mg  daily for short-course (5 days), with daily home weights and follow up in 7-10 days.  He knows to call if worsening shortness of breath or other problems/concerns.

## 2013-03-09 NOTE — Assessment & Plan Note (Signed)
Continues despite holiday from chemo agents.  Appears to be fairly mild, easy for him to stop.  To continue using nasal saline/lubricants.  He has been seen by ENT for this and had cautery to R side (patient report).

## 2013-03-09 NOTE — Patient Instructions (Addendum)
It was a pleasure to see you today.  I believe your shortness of breath is related to fluid retention.   Please get the chest x-ray when you leave here [facility]).  I will call your home this afternoon when I see the results.   In the meantime I ask that you take the Furosemide 40mg  one time daily for 5 days, and continue to weigh yourself daily at home.   I would like to see you early in the week of August 25th, to recheck your weight and see how you are doing.   PLEASE SCHEDULE WITH DR Mauricio Po ON AUGUST 26TH FOR FOLLOW UP.

## 2013-03-09 NOTE — Telephone Encounter (Signed)
I called patient to report the results of his CXR, which does not show focal infiltrate or overt pulmonary edema.  Will continue with same plan.  JB

## 2013-03-09 NOTE — Progress Notes (Signed)
  Subjective:    Patient ID: Todd [name], male    DOB: 1929/10/24, 79 y.o.   MRN: [id number]  HPI Mr Bentz comes in for follow up visit.  He is on a chemo "holiday", but reports that he continues with the epistaxis almost daily, despite being off the Avastin and other chemo agents.  He reports recent worsening (@5  days) of fatigue with shortness of breath and some bilateral leg swelling.   He has continued to have a mild cough which is "unchanged in 20 years" and without hemoptysis.  He denies chest pain or pressure.  He says the shortness of breath may be mildly worsened by position (supine) but not markedly so.  He is eating a regular diet but sometimes feels that food and drink has trouble passing.  He does not cough, no nausea and no vomiting.  Is able to get the food to go down.  Robust appetite.  Eating more salads to help regulate his diarrhea, which continues.  He is able to control with Lomotil as needed.    Review of Systems Denies fevers or chills; some green-brown sputum with cough; no leg pain although he does have bilateral swelling.  No palpitations or chest pain/pressure.      Objective:   Physical Exam Generally well appearing, no apparent distress. Gaunt HEENT Neck supple, without cervical adenopathy. Mild crusting in L naris; clear nasal mucosa on R side.  Clear oropharynx.  COR Regular S1S2 PULM Rhonchorous breath sounds bilaterally.  No wheezes heard. No rales. LEs: 2-3+ bilateral pitting edema that is symmetric.  No cords noted. Palpable dp pulses bilat.  No popliteal tenderness bilaterally.        Assessment & Plan:

## 2013-03-14 ENCOUNTER — Ambulatory Visit (HOSPITAL_BASED_OUTPATIENT_CLINIC_OR_DEPARTMENT_OTHER): Payer: Medicare Other

## 2013-03-14 VITALS — BP 138/83 | HR 69 | Temp 97.0°F

## 2013-03-14 DIAGNOSIS — C187 Malignant neoplasm of sigmoid colon: Secondary | ICD-10-CM

## 2013-03-14 DIAGNOSIS — C189 Malignant neoplasm of colon, unspecified: Secondary | ICD-10-CM

## 2013-03-14 DIAGNOSIS — Z452 Encounter for adjustment and management of vascular access device: Secondary | ICD-10-CM

## 2013-03-14 DIAGNOSIS — C787 Secondary malignant neoplasm of liver and intrahepatic bile duct: Secondary | ICD-10-CM

## 2013-03-14 MED ORDER — HEPARIN SOD (PORK) LOCK FLUSH 100 UNIT/ML IV SOLN
500.0000 [IU] | Freq: Once | INTRAVENOUS | Status: AC
  Administered 2013-03-14: 500 [IU] via INTRAVENOUS
  Filled 2013-03-14: qty 5

## 2013-03-14 MED ORDER — SODIUM CHLORIDE 0.9 % IJ SOLN
10.0000 mL | INTRAMUSCULAR | Status: DC | PRN
  Administered 2013-03-14: 10 mL via INTRAVENOUS
  Filled 2013-03-14: qty 10

## 2013-03-20 ENCOUNTER — Other Ambulatory Visit: Payer: Self-pay | Admitting: *Deleted

## 2013-03-20 ENCOUNTER — Other Ambulatory Visit: Payer: Self-pay | Admitting: Family Medicine

## 2013-03-20 ENCOUNTER — Ambulatory Visit (INDEPENDENT_AMBULATORY_CARE_PROVIDER_SITE_OTHER): Payer: Medicare Other | Admitting: Family Medicine

## 2013-03-20 ENCOUNTER — Encounter: Payer: Self-pay | Admitting: Family Medicine

## 2013-03-20 VITALS — BP 120/77 | HR 65 | Temp 97.5°F | Ht 70.0 in | Wt 208.0 lb

## 2013-03-20 DIAGNOSIS — E119 Type 2 diabetes mellitus without complications: Secondary | ICD-10-CM

## 2013-03-20 DIAGNOSIS — R0609 Other forms of dyspnea: Secondary | ICD-10-CM

## 2013-03-20 DIAGNOSIS — R0600 Dyspnea, unspecified: Secondary | ICD-10-CM

## 2013-03-20 MED ORDER — DIPHENOXYLATE-ATROPINE 2.5-0.025 MG PO TABS: 2.0000 | ORAL_TABLET | Freq: Four times a day (QID) | ORAL | Status: DC | PRN

## 2013-03-20 MED ORDER — HYDROCHLOROTHIAZIDE 25 MG PO TABS: ORAL_TABLET | ORAL | Status: DC

## 2013-03-20 MED ORDER — TORSEMIDE 20 MG PO TABS: 20.0000 mg | ORAL_TABLET | Freq: Every day | ORAL | Status: DC

## 2013-03-20 MED ORDER — DICLOFENAC SODIUM 75 MG PO TBEC: 75.0000 mg | DELAYED_RELEASE_TABLET | Freq: Two times a day (BID) | ORAL | Status: DC | PRN

## 2013-03-20 MED ORDER — ISOSORBIDE MONONITRATE ER 60 MG PO TB24: ORAL_TABLET | ORAL | Status: DC

## 2013-03-20 NOTE — Patient Instructions (Addendum)
It was a pleasure to see you today.  Your Hemoglobin A1C today is 5.2!  We will follow the results of your kidney function and the rest of your labs when you have them checked next week.   I switched your diuretic to TORSEMIDE 20mg , take 1 tablet daily and monitor your "dry weight" until it reaches 200 lbs.  If no significant change in your weight after 7 consecutive days, give me a call.  Follow up in 2 months.

## 2013-03-21 MED ORDER — DICLOFENAC SODIUM 1 % TD GEL: 4.0000 g | Freq: Four times a day (QID) | TRANSDERMAL | Status: DC

## 2013-03-21 NOTE — Telephone Encounter (Signed)
Pt called and given message. Elizabeth Bernise Sylvain, RN-BSN  

## 2013-03-21 NOTE — Telephone Encounter (Signed)
I sent in the gel prescription.  I did not intend to refill tablets of diclofenac for him yesterday (we discussed the gel).  I discontinued the tablets on his med list.  Would you be able to call him and let him know this (that the gel is ordered, and that the tablet is discontinued)? Thanks, JB

## 2013-03-21 NOTE — Assessment & Plan Note (Signed)
He is markedly improved from previous visit 10 days ago.  I suspect that he may have had some mild fluid overload (prior ECHO March 2014 with EF 40%) with his impaired systolic function.  He feels that he does not achieve very good diuresis with the furosemide; we will plan a trial of torsemide 20mg  daily intermittently, based upon deviation from an ideal dry weight of around 200 lbs on home bathroom scale (he performs daily weights).

## 2013-03-21 NOTE — Progress Notes (Signed)
  Subjective:    Patient ID: Todd [name], male    DOB: 13-Oct-1929, 79 y.o.   MRN: [id number]  HPI Here for follow up of dyspnea, had taken 5 days of lasix 40mg /day and noticed weight loss to nadir of 199# at home on bathroom scale.  His breathing has improved markedly.  No chest pain.  Has noted his ideal dry weight at home to be 195 lbs.  Denies cough or sputum production now.  Has noticed some improvement in his lower extremity swelling.  Of note, he has an appointment with Oncology next week, during which he expects to discuss options for continued therapy/palliation for his stage IV colon cancer.  He reports that his epistaxis has improved since last visit, and he is able to eat anything he wants without diarrhea, which he attributed to the chemotx regimen.     Review of Systems  Denies fevers or chills, no chest pain or dyspnea at this time.  No dysuria.  Gets up around 4-6am to void.     Objective:   Physical Exam Well appearing, in good spirits and in no apparent distress HEENT Neck supple.  COR Regular S1S2 PULM Clear lung fields bilaterally, no wheezes or rales.  LEs still with 2+ bilateral pitting edema in legs.        Assessment & Plan:

## 2013-03-28 ENCOUNTER — Ambulatory Visit (HOSPITAL_BASED_OUTPATIENT_CLINIC_OR_DEPARTMENT_OTHER): Payer: Medicare Other | Admitting: Oncology

## 2013-03-28 ENCOUNTER — Other Ambulatory Visit (HOSPITAL_BASED_OUTPATIENT_CLINIC_OR_DEPARTMENT_OTHER): Payer: Medicare Other | Admitting: Lab

## 2013-03-28 ENCOUNTER — Telehealth: Payer: Self-pay | Admitting: Oncology

## 2013-03-28 ENCOUNTER — Telehealth: Payer: Self-pay | Admitting: Family Medicine

## 2013-03-28 VITALS — BP 139/75 | HR 87 | Temp 97.3°F | Resp 18 | Ht 70.0 in | Wt 213.4 lb

## 2013-03-28 DIAGNOSIS — C187 Malignant neoplasm of sigmoid colon: Secondary | ICD-10-CM

## 2013-03-28 DIAGNOSIS — R040 Epistaxis: Secondary | ICD-10-CM

## 2013-03-28 DIAGNOSIS — C787 Secondary malignant neoplasm of liver and intrahepatic bile duct: Secondary | ICD-10-CM

## 2013-03-28 DIAGNOSIS — C189 Malignant neoplasm of colon, unspecified: Secondary | ICD-10-CM

## 2013-03-28 DIAGNOSIS — R197 Diarrhea, unspecified: Secondary | ICD-10-CM

## 2013-03-28 LAB — CBC WITH DIFFERENTIAL/PLATELET
BASO%: 0.8 % (ref 0.0–2.0)
EOS%: 3 % (ref 0.0–7.0)
HCT: 36.3 % — ABNORMAL LOW (ref 38.4–49.9)
LYMPH%: 13.4 % — ABNORMAL LOW (ref 14.0–49.0)
MCH: 31 pg (ref 27.2–33.4)
MCHC: 33.6 g/dL (ref 32.0–36.0)
MCV: 92.4 fL (ref 79.3–98.0)
MONO#: 0.4 10*3/uL (ref 0.1–0.9)
NEUT%: 76.6 % — ABNORMAL HIGH (ref 39.0–75.0)
Platelets: 150 10*3/uL (ref 140–400)

## 2013-03-28 LAB — COMPREHENSIVE METABOLIC PANEL (CC13)
ALT: 15 U/L (ref 0–55)
CO2: 22 mEq/L (ref 22–29)
Creatinine: 1 mg/dL (ref 0.7–1.3)
Total Bilirubin: 1.1 mg/dL (ref 0.20–1.20)

## 2013-03-28 LAB — CEA: CEA: 0.7 ng/mL (ref 0.0–5.0)

## 2013-03-28 NOTE — Telephone Encounter (Signed)
gv pt appt schedule for September.  °

## 2013-03-28 NOTE — Telephone Encounter (Signed)
I had not tried to call Todd [name].  I have reviewed his lab work done this morning at the [facility].  Please let him know that I have reviewed his labs of this morning.  JB

## 2013-03-28 NOTE — Telephone Encounter (Signed)
Will route note to Dr. Mauricio Po.  Gaylene Brooks, RN

## 2013-03-28 NOTE — Telephone Encounter (Signed)
Called pt and notified of Dr. Marinell Blight message  Pt wants to report no weight loss since change in diuretic and pt states he is no better than he was before.  Will fwd this message to MD.   Radene Ou, CMA

## 2013-03-28 NOTE — Progress Notes (Signed)
Hematology and Oncology Follow Up Visit  Tracker Mance [id number] 1930-06-15 79 y.o. 03/28/2013 9:11 AM [name], MDBreen, [name], MD   Principle Diagnosis: 79 year old with stage IV colon cancer diagnosed in 08/2012. He presented with a colonic mass and liver lesions.  KRAS wild type.   Prior Therapy: He is S/P LAPAROSCOPIC ASSISTED SIGMOID COLON RESECTION, END Left OSTOMY, Hartmann's pouch and acement of power port done on 09/07/2012.   Current therapy:  He started FOLFIRI and Avastin on 3/26. Avastin was stopped after 4 cycles due to epistaxis. He is S/P 8 cycles of treatment last one given on 01/24/2013. He has been on a treatment break for the last 2 months.   Interim History: Mr. Stopa presents today for a follow up visit. He is a very nice man with stage IV colon cancer. He under went surgical resection on 09/07/2012 and have done well chemotherapy in general, but after 8 cycles of chemotherapy he developed worsening epistaxislast he has done poorly. After 2 months break, some issues has improved (mostly fatigue) but did develop leg edema and shortness of breath. He was treated with diuretics with some improvement in the last week. No longer having issues with constipation. No chest pain, shortness of breath, abdominal pain, nausea, or vomiting. Appetite is improved and his weight is up (mostly due to fluids). He reports some dry cough and slight congestion and times.   Medications: I have reviewed the patient's current medications.  Current Outpatient Prescriptions  Medication Sig Dispense Refill  . aspirin 81 MG tablet Take 81 mg by mouth daily.        . benazepril (LOTENSIN) 10 MG tablet Take 1 tablet (10 mg total) by mouth daily.  90 tablet  3  . chlorhexidine (PERIDEX) 0.12 % solution Use as directed 15 mLs in the mouth or throat 2 (two) times daily. Use as directed for mouth irritation      . colchicine 0.6 MG tablet Take 0.6 mg by mouth daily as needed (for gout flares).      .  diclofenac (VOLTAREN) 75 MG EC tablet Take 1 tablet (75 mg total) by mouth 2 (two) times daily as needed.  180 tablet  0  . diclofenac sodium (VOLTAREN) 1 % GEL Apply 4 g topically 4 (four) times daily.  100 g  2  . diphenoxylate-atropine (LOMOTIL) 2.5-0.025 MG per tablet Take 2 tablets by mouth 4 (four) times daily as needed for diarrhea or loose stools.  60 tablet  1  . doxazosin (CARDURA) 8 MG tablet Take 8 mg by mouth daily.       . feeding supplement (ENSURE COMPLETE) LIQD Take 237 mLs by mouth 2 (two) times daily between meals.  60 Bottle  2  . gabapentin (NEURONTIN) 300 MG capsule Take 300 mg by mouth as needed.      . hydrochlorothiazide (HYDRODIURIL) 25 MG tablet TAKE 1 TABLET BY MOUTH EVERY DAY  90 tablet  3  . isosorbide mononitrate (IMDUR) 60 MG 24 hr tablet TAKE 1 TABLET BY MOUTH EVERY DAY  90 tablet  3  . lidocaine-prilocaine (EMLA) cream Apply topically as needed. Prior to chemotherapy treatment, Apply approx 1/2 teaspoon to skin over port, do not rub in. Cover with plastic,  30 g  1  . loratadine (CLARITIN) 10 MG tablet Take 10 mg by mouth daily.        . metoprolol succinate (TOPROL-XL) 50 MG 24 hr tablet Take 50 mg by mouth daily. Take with  or immediately following a meal.      . Multiple Vitamin (MULTIVITAMIN) tablet Take 1 tablet by mouth daily.        . nitroGLYCERIN (NITROSTAT) 0.4 MG SL tablet Place 0.4 mg under the tongue every 5 (five) minutes as needed.        . nystatin cream (MYCOSTATIN) Apply topically 2 (two) times daily.  30 g  2  . Polyethyl Glycol-Propyl Glycol (SYSTANE OP) Apply 1 drop to eye daily. 1 or 2 drops in the affected eye as needed      . prochlorperazine (COMPAZINE) 10 MG tablet Take 1 tablet (10 mg total) by mouth every 6 (six) hours as needed.  30 tablet  1  . simvastatin (ZOCOR) 40 MG tablet TAKE 1 TABLET BY MOUTH EVERY NIGHT AT BEDTIME  90 tablet  0  . torsemide (DEMADEX) 20 MG tablet TAKE 1 TABLET BY MOUTH DAILY  90 tablet  1  . zoster vaccine live,  PF, (ZOSTAVAX) 16109 UNT/0.65ML injection Inject into the skin once. Dispense to patient to be admin in doctors office        No current facility-administered medications for this visit.    Allergies: No Known Allergies  Past Medical History, Surgical history, Social history, and Family History were reviewed and updated.  Review of Systems:  Remaining ROS negative.  Physical Exam: Blood pressure 139/75, pulse 87, temperature 97.3 F (36.3 C), temperature source Oral, resp. rate 18, height 5\' 10"  (1.778 m), weight 213 lb 6.4 oz (96.798 kg), SpO2 98.00%. ECOG: 1 General appearance: alert Head: Normocephalic, without obvious abnormality, atraumatic Neck: no adenopathy, no carotid bruit, no JVD, supple, symmetrical, trachea midline and thyroid not enlarged, symmetric, no tenderness/mass/nodules Lymph nodes: Cervical, supraclavicular, and axillary nodes normal. Heart:regular rate and rhythm, S1, S2 normal, no murmur, click, rub or gallop Lung:chest clear, no wheezing, rales, normal symmetric air entry Abdomen: soft, non-tender, without masses or organomegaly EXT:no erythema, induration, or nodules.  1 to 2 + edema.    Lab Results: Lab Results  Component Value Date   WBC 6.5 03/28/2013   HGB 12.2* 03/28/2013   HCT 36.3* 03/28/2013   MCV 92.4 03/28/2013   PLT 150 03/28/2013     Chemistry      Component Value Date/Time   NA 144 03/28/2013 0816   NA 135 09/13/2012 0510   K 4.3 03/28/2013 0816   K 3.6 09/13/2012 0510   CL 105 01/10/2013 0831   CL 100 09/13/2012 0510   CO2 22 03/28/2013 0816   CO2 28 09/13/2012 0510   BUN 20.3 03/28/2013 0816   BUN 16 09/13/2012 0510   CREATININE 1.0 03/28/2013 0816   CREATININE 0.70 09/13/2012 0510   CREATININE 0.76 06/27/2012 0856      Component Value Date/Time   CALCIUM 9.2 03/28/2013 0816   CALCIUM 8.8 09/13/2012 0510   ALKPHOS 44 03/28/2013 0816   ALKPHOS 39 01/15/2013 0935   AST 21 03/28/2013 0816   AST 17 01/15/2013 0935   ALT 15 03/28/2013 0816   ALT 14 01/15/2013  0935   BILITOT 1.10 03/28/2013 0816   BILITOT 1.6* 01/15/2013 0935     Impression and Plan:  79 year old man with :  1. Stage IV colon cancer. He is S/P sigmoid colon resection with a bulky tumor in the liver. On palliative chemotherapy with FOLFIRI/Avastin. He is S/P cycle 8 cycles. CT scan results from 7/14 (after 8 cycles) showed slight decline in tumor burden. I discussed risks and benefits  of restarting chemotherapy at this time. I feel a longer break can cause his cancer to grow more and result in further decline in his health. He is willing to start again next week with reduced dose FOLFIRI without avastin.    2. Port management. Port in place, no flush will be needed till 9/3.   3. Congestion/cough and shortness of breath: mostly due to CHF. Possibly Lotensin causing the dry cough? He is following with Dr. Mauricio Po regarding that.   4. Epistaxis: improved off Avastin.   5. Diarrhea. Due to Irinotecan. Recommend that he continue Imodium and Lomotil as needed.   6. Follow up: on 9/24.   Vendela Troung 9/3/20149:11 AM

## 2013-03-28 NOTE — Telephone Encounter (Signed)
Pt is returning a call from Dr. Mauricio Po.  He said to call back after 4 p.m. Myriam Jacobson

## 2013-03-29 NOTE — Telephone Encounter (Signed)
Called patient back, he has been taking torsemide 20mg  daily since last Fri, has just started to effect diuresis overnight last night.  He does not notice increased swelling in his ankles/feet, but has seen 5 lb weight gain in his bathroom scale.  Plan to continue at 20mg /day and he has permission to increase to 40mg /day if sx of increased edema or dyspnea, and he is to call me if he needs to increase dose. JB

## 2013-04-01 ENCOUNTER — Emergency Department (HOSPITAL_BASED_OUTPATIENT_CLINIC_OR_DEPARTMENT_OTHER): Payer: Medicare Other

## 2013-04-01 ENCOUNTER — Encounter (HOSPITAL_BASED_OUTPATIENT_CLINIC_OR_DEPARTMENT_OTHER): Payer: Self-pay | Admitting: Emergency Medicine

## 2013-04-01 ENCOUNTER — Inpatient Hospital Stay (HOSPITAL_BASED_OUTPATIENT_CLINIC_OR_DEPARTMENT_OTHER)
Admission: EM | Admit: 2013-04-01 | Discharge: 2013-04-05 | DRG: 175 | Disposition: A | Discharge status: Home / Self Care | Payer: Medicare Other | Attending: Family Medicine | Admitting: Family Medicine

## 2013-04-01 DIAGNOSIS — I5023 Acute on chronic systolic (congestive) heart failure: Secondary | ICD-10-CM

## 2013-04-01 DIAGNOSIS — Z86718 Personal history of other venous thrombosis and embolism: Secondary | ICD-10-CM

## 2013-04-01 DIAGNOSIS — E669 Obesity, unspecified: Secondary | ICD-10-CM | POA: Diagnosis present

## 2013-04-01 DIAGNOSIS — C189 Malignant neoplasm of colon, unspecified: Secondary | ICD-10-CM | POA: Diagnosis present

## 2013-04-01 DIAGNOSIS — I2699 Other pulmonary embolism without acute cor pulmonale: Secondary | ICD-10-CM | POA: Diagnosis present

## 2013-04-01 DIAGNOSIS — I10 Essential (primary) hypertension: Secondary | ICD-10-CM | POA: Diagnosis present

## 2013-04-01 DIAGNOSIS — E119 Type 2 diabetes mellitus without complications: Secondary | ICD-10-CM | POA: Diagnosis present

## 2013-04-01 DIAGNOSIS — R0600 Dyspnea, unspecified: Secondary | ICD-10-CM

## 2013-04-01 DIAGNOSIS — Z7982 Long term (current) use of aspirin: Secondary | ICD-10-CM

## 2013-04-01 DIAGNOSIS — I2510 Atherosclerotic heart disease of native coronary artery without angina pectoris: Secondary | ICD-10-CM | POA: Diagnosis present

## 2013-04-01 DIAGNOSIS — T45515A Adverse effect of anticoagulants, initial encounter: Secondary | ICD-10-CM | POA: Diagnosis present

## 2013-04-01 DIAGNOSIS — I2589 Other forms of chronic ischemic heart disease: Secondary | ICD-10-CM | POA: Diagnosis present

## 2013-04-01 DIAGNOSIS — Z87891 Personal history of nicotine dependence: Secondary | ICD-10-CM

## 2013-04-01 DIAGNOSIS — I252 Old myocardial infarction: Secondary | ICD-10-CM

## 2013-04-01 DIAGNOSIS — I219 Acute myocardial infarction, unspecified: Secondary | ICD-10-CM | POA: Diagnosis present

## 2013-04-01 DIAGNOSIS — Z933 Colostomy status: Secondary | ICD-10-CM

## 2013-04-01 DIAGNOSIS — G589 Mononeuropathy, unspecified: Secondary | ICD-10-CM | POA: Diagnosis present

## 2013-04-01 DIAGNOSIS — N400 Enlarged prostate without lower urinary tract symptoms: Secondary | ICD-10-CM | POA: Diagnosis present

## 2013-04-01 DIAGNOSIS — J189 Pneumonia, unspecified organism: Secondary | ICD-10-CM

## 2013-04-01 DIAGNOSIS — C787 Secondary malignant neoplasm of liver and intrahepatic bile duct: Secondary | ICD-10-CM | POA: Diagnosis present

## 2013-04-01 DIAGNOSIS — I5189 Other ill-defined heart diseases: Secondary | ICD-10-CM | POA: Diagnosis present

## 2013-04-01 DIAGNOSIS — D649 Anemia, unspecified: Secondary | ICD-10-CM | POA: Diagnosis present

## 2013-04-01 DIAGNOSIS — R791 Abnormal coagulation profile: Secondary | ICD-10-CM | POA: Diagnosis not present

## 2013-04-01 DIAGNOSIS — I509 Heart failure, unspecified: Secondary | ICD-10-CM

## 2013-04-01 DIAGNOSIS — M069 Rheumatoid arthritis, unspecified: Secondary | ICD-10-CM | POA: Diagnosis present

## 2013-04-01 DIAGNOSIS — M109 Gout, unspecified: Secondary | ICD-10-CM | POA: Diagnosis present

## 2013-04-01 DIAGNOSIS — Z6830 Body mass index (BMI) 30.0-30.9, adult: Secondary | ICD-10-CM

## 2013-04-01 DIAGNOSIS — Z5189 Encounter for other specified aftercare: Secondary | ICD-10-CM

## 2013-04-01 DIAGNOSIS — I5043 Acute on chronic combined systolic (congestive) and diastolic (congestive) heart failure: Secondary | ICD-10-CM | POA: Diagnosis present

## 2013-04-01 DIAGNOSIS — R040 Epistaxis: Secondary | ICD-10-CM

## 2013-04-01 DIAGNOSIS — E785 Hyperlipidemia, unspecified: Secondary | ICD-10-CM | POA: Diagnosis present

## 2013-04-01 DIAGNOSIS — E43 Unspecified severe protein-calorie malnutrition: Secondary | ICD-10-CM | POA: Insufficient documentation

## 2013-04-01 DIAGNOSIS — Z85038 Personal history of other malignant neoplasm of large intestine: Secondary | ICD-10-CM

## 2013-04-01 DIAGNOSIS — I255 Ischemic cardiomyopathy: Secondary | ICD-10-CM | POA: Diagnosis present

## 2013-04-01 LAB — CBC WITH DIFFERENTIAL/PLATELET
HCT: 35.5 % — ABNORMAL LOW (ref 39.0–52.0)
Hemoglobin: 11.6 g/dL — ABNORMAL LOW (ref 13.0–17.0)
Lymphocytes Relative: 17 % (ref 12–46)
Lymphs Abs: 1.1 10*3/uL (ref 0.7–4.0)
MCHC: 32.7 g/dL (ref 30.0–36.0)
Monocytes Absolute: 0.5 10*3/uL (ref 0.1–1.0)
Monocytes Relative: 7 % (ref 3–12)
Neutro Abs: 4.7 10*3/uL (ref 1.7–7.7)

## 2013-04-01 LAB — COMPREHENSIVE METABOLIC PANEL
BUN: 15 mg/dL (ref 6–23)
CO2: 26 mEq/L (ref 19–32)
Chloride: 104 mEq/L (ref 96–112)
Creatinine, Ser: 0.9 mg/dL (ref 0.50–1.35)
GFR calc non Af Amer: 78 mL/min — ABNORMAL LOW (ref >90)
Glucose, Bld: 108 mg/dL — ABNORMAL HIGH (ref 70–99)
Total Bilirubin: 0.9 mg/dL (ref 0.3–1.2)

## 2013-04-01 LAB — TROPONIN I: Troponin I: <0.3 ng/mL (ref <0.30)

## 2013-04-01 LAB — D-DIMER, QUANTITATIVE: D-Dimer, Quant: 1.95 ug/mL-FEU — ABNORMAL HIGH (ref 0.00–0.48)

## 2013-04-01 MED ORDER — ASPIRIN 81 MG PO TABS: 81.0000 mg | ORAL_TABLET | Freq: Every day | ORAL | Status: DC

## 2013-04-01 MED ORDER — ENSURE PO LIQD: 1.0000 | Freq: Every day | ORAL | Status: DC

## 2013-04-01 MED ORDER — ASPIRIN EC 81 MG PO TBEC
81.0000 mg | DELAYED_RELEASE_TABLET | Freq: Every day | ORAL | Status: DC
  Administered 2013-04-02 – 2013-04-05 (×4): 81 mg via ORAL
  Filled 2013-04-01 (×4): qty 1

## 2013-04-01 MED ORDER — LORATADINE 10 MG PO TABS
10.0000 mg | ORAL_TABLET | Freq: Every day | ORAL | Status: DC
  Administered 2013-04-02 – 2013-04-05 (×4): 10 mg via ORAL
  Filled 2013-04-01 (×4): qty 1

## 2013-04-01 MED ORDER — ENSURE COMPLETE PO LIQD
237.0000 mL | Freq: Every day | ORAL | Status: DC
  Administered 2013-04-02 – 2013-04-05 (×4): 237 mL via ORAL

## 2013-04-01 MED ORDER — DICLOFENAC SODIUM 1 % TD GEL
4.0000 g | Freq: Four times a day (QID) | TRANSDERMAL | Status: DC
  Administered 2013-04-03 (×2): 4 g via TOPICAL
  Filled 2013-04-01: qty 100

## 2013-04-01 MED ORDER — ISOSORBIDE MONONITRATE ER 60 MG PO TB24
60.0000 mg | ORAL_TABLET | Freq: Every day | ORAL | Status: DC
  Administered 2013-04-02 – 2013-04-05 (×4): 60 mg via ORAL
  Filled 2013-04-01 (×4): qty 1

## 2013-04-01 MED ORDER — ONE-DAILY MULTI VITAMINS PO TABS: 1.0000 | ORAL_TABLET | Freq: Every day | ORAL | Status: DC

## 2013-04-01 MED ORDER — HYDROCHLOROTHIAZIDE 25 MG PO TABS
25.0000 mg | ORAL_TABLET | Freq: Every day | ORAL | Status: DC
  Administered 2013-04-02 – 2013-04-05 (×4): 25 mg via ORAL
  Filled 2013-04-01 (×5): qty 1

## 2013-04-01 MED ORDER — ENOXAPARIN SODIUM 40 MG/0.4ML ~~LOC~~ SOLN
40.0000 mg | Freq: Every day | SUBCUTANEOUS | Status: DC
  Filled 2013-04-01: qty 0.4

## 2013-04-01 MED ORDER — CHLORHEXIDINE GLUCONATE 0.12 % MT SOLN
15.0000 mL | Freq: Two times a day (BID) | OROMUCOSAL | Status: DC
  Administered 2013-04-02 – 2013-04-04 (×3): 15 mL via OROMUCOSAL
  Filled 2013-04-01 (×9): qty 15

## 2013-04-01 MED ORDER — FUROSEMIDE 10 MG/ML IJ SOLN
40.0000 mg | Freq: Once | INTRAMUSCULAR | Status: AC
  Administered 2013-04-02: 40 mg via INTRAVENOUS
  Filled 2013-04-01: qty 4

## 2013-04-01 MED ORDER — FUROSEMIDE 10 MG/ML IJ SOLN
60.0000 mg | Freq: Once | INTRAMUSCULAR | Status: AC
  Administered 2013-04-01: 60 mg via INTRAVENOUS
  Filled 2013-04-01: qty 6

## 2013-04-01 MED ORDER — DICLOFENAC SODIUM 75 MG PO TBEC
75.0000 mg | DELAYED_RELEASE_TABLET | Freq: Two times a day (BID) | ORAL | Status: DC | PRN
  Filled 2013-04-01: qty 1

## 2013-04-01 MED ORDER — BENAZEPRIL HCL 10 MG PO TABS
10.0000 mg | ORAL_TABLET | Freq: Every day | ORAL | Status: DC
  Administered 2013-04-02 – 2013-04-05 (×4): 10 mg via ORAL
  Filled 2013-04-01 (×4): qty 1

## 2013-04-01 MED ORDER — LIDOCAINE-PRILOCAINE 2.5-2.5 % EX CREA
TOPICAL_CREAM | Freq: Once | CUTANEOUS | Status: AC
  Administered 2013-04-01: 22:00:00 via TOPICAL
  Filled 2013-04-01: qty 5

## 2013-04-01 MED ORDER — GABAPENTIN 300 MG PO CAPS: 300.0000 mg | ORAL_CAPSULE | ORAL | Status: DC | PRN

## 2013-04-01 MED ORDER — SIMVASTATIN 20 MG PO TABS
20.0000 mg | ORAL_TABLET | Freq: Every day | ORAL | Status: DC
  Administered 2013-04-02 – 2013-04-04 (×3): 20 mg via ORAL
  Filled 2013-04-01 (×4): qty 1

## 2013-04-01 MED ORDER — PIPERACILLIN-TAZOBACTAM 3.375 G IVPB
3.3750 g | Freq: Once | INTRAVENOUS | Status: AC
  Administered 2013-04-01: 3.375 g via INTRAVENOUS
  Filled 2013-04-01: qty 50

## 2013-04-01 MED ORDER — METOPROLOL SUCCINATE ER 50 MG PO TB24
50.0000 mg | ORAL_TABLET | Freq: Every day | ORAL | Status: DC
  Administered 2013-04-02 – 2013-04-05 (×4): 50 mg via ORAL
  Filled 2013-04-01 (×4): qty 1

## 2013-04-01 MED ORDER — VANCOMYCIN HCL IN DEXTROSE 1-5 GM/200ML-% IV SOLN
1000.0000 mg | Freq: Once | INTRAVENOUS | Status: AC
  Administered 2013-04-01: 1000 mg via INTRAVENOUS
  Filled 2013-04-01: qty 200

## 2013-04-01 MED ORDER — SALINE SPRAY 0.65 % NA SOLN
1.0000 | NASAL | Status: DC | PRN
  Administered 2013-04-02: 17:00:00 1 via NASAL
  Filled 2013-04-01: qty 44

## 2013-04-01 MED ORDER — ADULT MULTIVITAMIN W/MINERALS CH
1.0000 | ORAL_TABLET | Freq: Every day | ORAL | Status: DC
  Administered 2013-04-02 – 2013-04-05 (×4): 1 via ORAL
  Filled 2013-04-01 (×4): qty 1

## 2013-04-01 MED ORDER — DOXAZOSIN MESYLATE 4 MG PO TABS
4.0000 mg | ORAL_TABLET | Freq: Two times a day (BID) | ORAL | Status: DC
  Administered 2013-04-01 – 2013-04-05 (×8): 4 mg via ORAL
  Filled 2013-04-01 (×9): qty 1

## 2013-04-01 NOTE — ED Provider Notes (Signed)
CSN: [id number]     Arrival date & time 04/01/13  1305 History   First MD Initiated Contact with Patient 04/01/13 1407     Chief Complaint  Patient presents with  . Shortness of Breath   (Consider location/radiation/quality/duration/timing/severity/associated sxs/prior Treatment) HPI 79 y.o. Male with history of colon cancer, dm, chf, mi presents today with increasing waxing and waning sob for two weeks.  Today became very dyspneic with walking and resolved with rest.  Denies pain, but feels congested, cough productive of brown sputum.  History of smoking but quit many years ago.  Denies fever.  Diagnosed with colon cancer in February, had resection, and has been on chemo for metastatic disease.but on recent break due to low blood count.  Oncologist is Dr. Clelia Croft and is scheduled to restart on Wednesday. Patient seen by Dr. Mauricio Po on Monday and started on "another diuretic" because his feet were swollen.  Patient started amoxicillin at home yesterday and has had four doses.     PMD Dr. [facility] Oncologist [facility] Cardiology Dr. Allyson Sabal    Past Medical History  Diagnosis Date  . Myocardial infarction 1990    "Massive"- total LAD with collaterals  . Gout   . Hypertension   . Arthritis, rheumatoid   . Diabetes mellitus without complication   . CHF (congestive heart failure)   . BPH (benign prostatic hyperplasia)   . Neuropathy   . Hyperlipidemia   . Ischemic cardiomyopathy March 2012    EF 31% Myoview. Pt declines ICD  . Colon cancer 2014    Surg, chemo March 2014   Past Surgical History  Procedure Laterality Date  . Cataract extraction    . Flexible sigmoidoscopy N/A 09/05/2012    Procedure: FLEXIBLE SIGMOIDOSCOPY;  Surgeon: Barrie Folk, MD;  Location: [facility] ENDOSCOPY;  Service: Endoscopy;  Laterality: N/A;  Give 1000 cc tapwater enema on call to endoscopy.  . Colon resection N/A 09/07/2012    Procedure: LAPAROSCOPIC ASSISTED SIGMOID COLON RESECTION END OSTOMY;  Surgeon:  Kandis Cocking, MD;  Location: [facility] OR;  Service: General;  Laterality: N/A;  . Portacath placement N/A 09/07/2012    Procedure: Placement of power port;  Surgeon: Kandis Cocking, MD;  Location: [facility] OR;  Service: General;  Laterality: N/A;   Family History  Problem Relation Age of Onset  . Stroke Father   . Diabetes Father   . Hypertension Father   . Heart attack Father   . Hyperlipidemia Neg Hx   . Sudden death Neg Hx    History  Substance Use Topics  . Smoking status: Former Games developer  . Smokeless tobacco: Never Used     Comment: Quit 45 years ago.  . Alcohol Use: Yes     Comment: 1 drink a month    Review of Systems  Constitutional: Positive for appetite change. Negative for fever and chills.  HENT: Positive for congestion.   Eyes: Negative.   Respiratory: Positive for cough, chest tightness and shortness of breath.   Cardiovascular: Positive for leg swelling.  Gastrointestinal: Negative for vomiting and abdominal distention.  Endocrine: Negative.   Genitourinary: Negative.   Allergic/Immunologic: Negative.   Neurological: Negative.   Hematological: Negative.   Psychiatric/Behavioral: Negative.   All other systems reviewed and are negative.    Allergies  Review of patient's allergies indicates no known allergies.  Home Medications   Current Outpatient Rx  Name  Route  Sig  Dispense  Refill  . amoxicillin (AMOXIL) 500 MG tablet  Oral   Take 500 mg by mouth 3 (three) times daily.         Marland Kitchen aspirin 81 MG tablet   Oral   Take 81 mg by mouth daily.           . benazepril (LOTENSIN) 10 MG tablet   Oral   Take 1 tablet (10 mg total) by mouth daily.   90 tablet   3   . chlorhexidine (PERIDEX) 0.12 % solution   Mouth/Throat   Use as directed 15 mLs in the mouth or throat 2 (two) times daily. Use as directed for mouth irritation         . colchicine 0.6 MG tablet   Oral   Take 0.6 mg by mouth daily as needed (for gout flares).         . diclofenac  (VOLTAREN) 75 MG EC tablet   Oral   Take 1 tablet (75 mg total) by mouth 2 (two) times daily as needed.   180 tablet   0     **Patient requests 90 days supply**   . diclofenac sodium (VOLTAREN) 1 % GEL   Topical   Apply 4 g topically 4 (four) times daily.   100 g   2   . diphenoxylate-atropine (LOMOTIL) 2.5-0.025 MG per tablet   Oral   Take 2 tablets by mouth 4 (four) times daily as needed for diarrhea or loose stools.   60 tablet   1   . doxazosin (CARDURA) 8 MG tablet   Oral   Take 8 mg by mouth daily.          . feeding supplement (ENSURE COMPLETE) LIQD   Oral   Take 237 mLs by mouth 2 (two) times daily between meals.   60 Bottle   2   . gabapentin (NEURONTIN) 300 MG capsule   Oral   Take 300 mg by mouth as needed.         . hydrochlorothiazide (HYDRODIURIL) 25 MG tablet      TAKE 1 TABLET BY MOUTH EVERY DAY   90 tablet   3   . isosorbide mononitrate (IMDUR) 60 MG 24 hr tablet      TAKE 1 TABLET BY MOUTH EVERY DAY   90 tablet   3   . lidocaine-prilocaine (EMLA) cream   Topical   Apply topically as needed. Prior to chemotherapy treatment, Apply approx 1/2 teaspoon to skin over port, do not rub in. Cover with plastic,   30 g   1   . loratadine (CLARITIN) 10 MG tablet   Oral   Take 10 mg by mouth daily.           . metoprolol succinate (TOPROL-XL) 50 MG 24 hr tablet   Oral   Take 50 mg by mouth daily. Take with or immediately following a meal.         . Multiple Vitamin (MULTIVITAMIN) tablet   Oral   Take 1 tablet by mouth daily.           . nitroGLYCERIN (NITROSTAT) 0.4 MG SL tablet   Sublingual   Place 0.4 mg under the tongue every 5 (five) minutes as needed.           . nystatin cream (MYCOSTATIN)   Topical   Apply topically 2 (two) times daily.   30 g   2   . Polyethyl Glycol-Propyl Glycol (SYSTANE OP)   Ophthalmic   Apply 1 drop to eye daily. 1 or  2 drops in the affected eye as needed         . prochlorperazine  (COMPAZINE) 10 MG tablet   Oral   Take 1 tablet (10 mg total) by mouth every 6 (six) hours as needed.   30 tablet   1     For nausea and vomiting   . simvastatin (ZOCOR) 40 MG tablet      TAKE 1 TABLET BY MOUTH EVERY NIGHT AT BEDTIME   90 tablet   0   . torsemide (DEMADEX) 20 MG tablet      TAKE 1 TABLET BY MOUTH DAILY   90 tablet   1     **Patient requests 90 days supply**   . zoster vaccine live, PF, (ZOSTAVAX) 40981 UNT/0.65ML injection   Subcutaneous   Inject into the skin once. Dispense to patient to be admin in doctors office           BP 138/87  Pulse 70  Temp(Src) 97.5 F (36.4 C) (Oral)  Resp 18  Ht 5\' 10"  (1.778 m)  Wt 218 lb (98.884 kg)  BMI 31.28 kg/m2  SpO2 97% Physical Exam  Nursing note and vitals reviewed. Constitutional: He is oriented to person, place, and time. He appears well-developed and well-nourished.  pale  HENT:  Head: Normocephalic.  Right Ear: External ear normal.  Left Ear: External ear normal.  Nose: Nose normal.  Mouth/Throat: Oropharynx is clear and moist.  Eyes: Conjunctivae and EOM are normal. Pupils are equal, round, and reactive to light.  Neck: Normal range of motion. Neck supple.  Cardiovascular: Normal rate, regular rhythm and normal heart sounds.   Pulmonary/Chest: Tachypnea noted.  Bibasilar rhonchi  Abdominal: Soft. Bowel sounds are normal.  Colostomy bag in place with soft stool present  Musculoskeletal: Normal range of motion. He exhibits edema.  Neurological: He is alert and oriented to person, place, and time.  Skin: Skin is warm and dry.  Psychiatric: He has a normal mood and affect. His behavior is normal. Judgment and thought content normal.    ED Course  Procedures (including critical care time) Labs Review Labs Reviewed - No data to display Imaging Review No results found.  MDM   Results for orders placed during the hospital encounter of 04/01/13  CBC WITH DIFFERENTIAL      Result Value Range    WBC 6.3  4.0 - 10.5 K/uL   RBC 3.79 (*) 4.22 - 5.81 MIL/uL   Hemoglobin 11.6 (*) 13.0 - 17.0 g/dL   HCT 19.1 (*) 47.8 - 29.5 %   MCV 93.7  78.0 - 100.0 fL   MCH 30.6  26.0 - 34.0 pg   MCHC 32.7  30.0 - 36.0 g/dL   RDW 62.1  30.8 - 65.7 %   Platelets 149 (*) 150 - 400 K/uL   Neutrophils Relative % 74  43 - 77 %   Neutro Abs 4.7  1.7 - 7.7 K/uL   Lymphocytes Relative 17  12 - 46 %   Lymphs Abs 1.1  0.7 - 4.0 K/uL   Monocytes Relative 7  3 - 12 %   Monocytes Absolute 0.5  0.1 - 1.0 K/uL   Eosinophils Relative 2  0 - 5 %   Eosinophils Absolute 0.1  0.0 - 0.7 K/uL   Basophils Relative 1  0 - 1 %   Basophils Absolute 0.0  0.0 - 0.1 K/uL  COMPREHENSIVE METABOLIC PANEL      Result Value Range   Sodium 139  135 - 145 mEq/L   Potassium 4.3  3.5 - 5.1 mEq/L   Chloride 104  96 - 112 mEq/L   CO2 26  19 - 32 mEq/L   Glucose, Bld 108 (*) 70 - 99 mg/dL   BUN 15  6 - 23 mg/dL   Creatinine, Ser 2.95  0.50 - 1.35 mg/dL   Calcium 9.6  8.4 - 62.1 mg/dL   Total Protein 6.7  6.0 - 8.3 g/dL   Albumin 3.5  3.5 - 5.2 g/dL   AST 25  0 - 37 U/L   ALT 16  0 - 53 U/L   Alkaline Phosphatase 47  39 - 117 U/L   Total Bilirubin 0.9  0.3 - 1.2 mg/dL   GFR calc non Af Amer 78 (*) >90 mL/min   GFR calc Af Amer >90  >90 mL/min  TROPONIN I      Result Value Range   Troponin I <0.30  <0.30 ng/mL  D-DIMER, QUANTITATIVE      Result Value Range   D-Dimer, Quant 1.95 (*) 0.00 - 0.48 ug/mL-FEU  PRO B NATRIURETIC PEPTIDE      Result Value Range   Pro B Natriuretic peptide (BNP) 3356.0 (*) 0 - 450 pg/mL   No information on file. Dg Chest 2 View  04/01/2013   *RADIOLOGY REPORT*  Clinical Data: Shortness of breath.  CHEST - 2 VIEW  Comparison: Chest x-Sri Clegg 03/09/2013.  Findings: Right internal jugular single lumen power Port-A-Cath with tip terminating in the distal superior vena cava.  Lung volumes are low, and there are slightly increasing bibasilar opacities which may simply reflect subsegmental atelectasis or  scarring, however, sequelae of aspiration or early bronchopneumonia is difficult to exclude.  Small bilateral pleural effusions.  No evidence of pulmonary edema.  Heart size is within normal limits. Mediastinal contours are unremarkable.  Atherosclerosis of the thoracic aorta.  IMPRESSION: 1.  Persistent small bilateral pleural effusions. 2.  Bibasilar opacities slightly increased, concerning for sequela of recent aspiration or developing bronchopneumonia, although there is likely some component of underlying bibasilar atelectasis/scarring.   Original Report Authenticated By: Trudie Reed, M.D.   Dg Chest 2 View Date: 04/01/2013  Rate: 65  Rhythm: normal sinus rhythm  QRS Axis: normal  Intervals: normal  ST/T Wave abnormalities: normal  Conduction Disutrbances:poor r wave progression cw. previous anterior mi.   Narrative Interpretation:   Old EKG Reviewed: none available   79 year old male with active stage IV colon cancer, CHF, and increasing dyspnea. He does appear to have congestive heart failure but chest x-Ayman Brull is also concerning for pneumonia. Given his recent contact with the health care system he is being treated as healthcare associated pneumonia and given Zosyn and vancomycin. He is also given Lasix 40 mg IV.  He does have an elevated d-dimer but given significant edema and rhonchi this is likely elevated due to other causes and doubt pe.  Further evaluation for dvt may be warranted.      I have discussed the findings of congestive heart failure and possible pneumonia with the comorbidity of colon cancer with the patient, his wife, and his daughter and have discussed the risks benefits of hospitalization. We will proceed with hospitalization and Dr. Marinell Blight service has been paged. Patient care discussed with Dr. Piedad Climes and plan admission to telemetry.   Hilario Quarry, MD 04/01/13 [phone]

## 2013-04-01 NOTE — ED Notes (Signed)
Attempted to call report to floor-will attempt again in approx 10 minutes.

## 2013-04-01 NOTE — H&P (Signed)
Family Medicine Teaching [facility] Admission History and Physical Service Pager: [phone]  Patient name: Todd [name] Medical record number: [id number] Date of birth: May 27, 1930 Age: 79 y.o. Gender: male  Primary Care Provider: [name], MD Consultants: None Code Status: Full  Chief Complaint: Fatigue, dyspnea  Assessment and Plan: Todd [name] is a 79 y.o. male presenting with ~ 1 week history of increasing fatigue and recent dyspnea . PMH is significant for Colon cancer, cardiomyopathy, HTN, CHF and gout.   CHF: Patient with a BNP 3350 on admission (no prior to compare). Lasix 60 IV given at [facility] and patient reported great relief. Last ECHO 09/2012 Ef 35-40%. Systolic function was moderately reduced with severe hypokinesis of the mid-distal anteroseptal, anterior, inferoseptal and apical myocardium. Grade 2 diastolic dysfunction. Elevated left atrial filling pressure. Mild AS, AR and TR. Moderate to severe MR. LA mildly dilated. RV pressure increased.  PA peak pressure: 51mm Hg (S). - Strict I/O; daily weights. Dry weight ?196. Will need to clarify dry weight for patient in the morning. Considering recent chemotherapy he has not had an appetite and weights have varied considerably.  - Fluid restriction to 1.5 liter water - Lasix 40 mg IV in the AM (scheduled for 0500) ; patient requested to not have lasix at night. Considering clinically stable picture, will continue to monitor through night and if needed with give lasix and apply condom cath.  - Will inform Cards (SE) he is here - Repeat BNP in AM  Pneumonia vs PE: Patient had a CXR at [facility] concerning for possible PNA (see below). Patient's last chemo treatment (in hospital was July 3rd). He did not present with fever or elevated WBC, although he likely would not from his chemo treatments. Considering his history and immunosuppression he was started on vanc/zosyn (9/7) for HCAP at the [facility] ED.  - A D-dimer was performed at [facility] and  resulted with 1.95; a CTA chest was ordered to rule out PE at [facility]. Patient has not been tachycardic and has maintained good oxygenation on RA. VSS. Well's score is significant at 20.5% chance of PE (immobilization, malignancy treatment, ?hemoptysis with "brown" sputum).  - Vanc/zosyn per pharm ordered - Continue to monitor O2 levels  - Repeat CBC in the AM  Hypertension: Patient has had stable vitals.  - Continue current home medications ASA, benazepril, cardura (BPH), HCTZ, metoprolol, Zocor  Diabetes:History of diabetes, without current medications. BG have been stable this admission. - Will continue to monitor   Epistaxis: Since the start of chemo patient has had frequent epistaxis. He reports daily use of saline nasal drops has helped reduce the amount of nose bleeds.   - will continue to monitor for bleeding  - Saline nasal drops ordered for comfort  FEN/GI:  Heart healthy diet. Fluid restriciton  Prophylaxis: Lovenox 40 mg  Disposition: discharge home with wife, pending clinical improvement  History of Present Illness: Todd [name] is a 79 y.o. male presenting with dyspnea, fatigue, weakness and cough that has progressed over the past week. He felt his shortness of breath was worsening and went the [facility] ED. He endorses a cough that is sometimes greenish to brown. He denies fevers, chills, vomit or diarrhea. He has colon cancer of which is had been undergoing chemotherapy. He was recently on a chemo break d/t "blood levels" and tolerance. His last chemo treatment was July 3rd, they started in March of 2014. He has a colostomy bag placed. He denies chest pain or abdominal pain. He reports  after receiving lasix in the ED he has felt greatly improved. He admits his legs have been holding a great deal of fluid, but reports they too are much improved from the x1 dose of lasix in ED. He has had not had a appetite since his chemotherapy, and states he is not eating well, in fact he states he eats  about 1/4 of his normal.   Review Of Systems: Per HPI  Otherwise 12 point review of systems was performed and was unremarkable.  Patient Active Problem List   Diagnosis Date Noted  . Dyspnea 03/09/2013  . Myocardial infarction   . Hypertension   . Arthritis, rheumatoid   . Diabetes mellitus without complication   . BPH (benign prostatic hyperplasia)   . Neuropathy   . Colon cancer   . Epistaxis 01/02/2013  . Onychomycosis 01/02/2013  . Right knee pain 12/25/2012  . Colon cancer metastasized to liver 09/19/2012  . CAD, Ant MI 1990- total LAD with collaterals, Myoview low risk 3/12 09/06/2012  . Encounter for pre-operative cardiovascular clearance 09/06/2012  . Heart murmur 09/06/2012  . Colonic mass 09/05/2012  . Nausea with vomiting and diarrhea 09/05/2012  . Elevated lactic acid level 09/05/2012  . Elevated lipase 09/05/2012  . Leukocytosis, unspecified 09/05/2012  . Prostate enlargement 09/05/2012  . Seborrheic keratosis 07/07/2012  . Hip pain, right 08/10/2011  . Right elbow pain 05/26/2011  . Tinea pedis 10/23/2010  . HYPERTENSION 07/22/2009  . PROSTATE SPECIFIC ANTIGEN, ELEVATED 07/22/2009  . DIABETES MELLITUS, CONTROLLED, WITHOUT COMPLICATIONS 03/25/2009  . BACK PAIN, LUMBAR 03/25/2009  . COUGH 09/09/2008  . Olecranon bursitis 11/16/2007  . ROTATOR CUFF INJURY, RIGHT SHOULDER 05/04/2007  . FAILURE, SYSTOLIC HEART NOS 10/20/2006  . CHEST PAIN 10/20/2006  . HYPERLIPIDEMIA 09/22/2006  . Gout, unspecified 09/22/2006  . OBESITY, NOS 09/22/2006  . HYPERTENSION, BENIGN SYSTEMIC 09/22/2006  . Cor Athrscl-Uns Vessel 09/22/2006  . Ischemic cardiomyopathy, EF 30% Myoview 3/12 09/22/2006  . Allergic Rhinitis, Cause Unspecified 09/22/2006  . Hyperplasia of prostate 09/22/2006  . OSTEOARTHRITIS OF SPINE, NOS 09/22/2006   Past Medical History: Past Medical History  Diagnosis Date  . Myocardial infarction 1990    "Massive"- total LAD with collaterals  . Gout   .  Hypertension   . Arthritis, rheumatoid   . Diabetes mellitus without complication   . CHF (congestive heart failure)   . BPH (benign prostatic hyperplasia)   . Neuropathy   . Hyperlipidemia   . Ischemic cardiomyopathy March 2012    EF 31% Myoview. Pt declines ICD  . Colon cancer 2014    Surg, chemo March 2014   Past Surgical History: Past Surgical History  Procedure Laterality Date  . Cataract extraction    . Flexible sigmoidoscopy N/A 09/05/2012    Procedure: FLEXIBLE SIGMOIDOSCOPY;  Surgeon: Barrie Folk, MD;  Location: [facility] ENDOSCOPY;  Service: Endoscopy;  Laterality: N/A;  Give 1000 cc tapwater enema on call to endoscopy.  . Colon resection N/A 09/07/2012    Procedure: LAPAROSCOPIC ASSISTED SIGMOID COLON RESECTION END OSTOMY;  Surgeon: Kandis Cocking, MD;  Location: [facility] OR;  Service: General;  Laterality: N/A;  . Portacath placement N/A 09/07/2012    Procedure: Placement of power port;  Surgeon: Kandis Cocking, MD;  Location: [facility] OR;  Service: General;  Laterality: N/A;   Social History: History  Substance Use Topics  . Smoking status: Former Smoker -- 1.00 packs/day    Quit date: 04/01/2009  . Smokeless tobacco: Never Used  Comment: Quit 45 years ago.  . Alcohol Use: No   Additional social history: Patient lives at home with his wife.  Please also refer to relevant sections of EMR.  Family History: Family History  Problem Relation Age of Onset  . Stroke Father   . Diabetes Father   . Hypertension Father   . Heart attack Father   . Hyperlipidemia Neg Hx   . Sudden death Neg Hx    Allergies and Medications: No Known Allergies No current facility-administered medications on file prior to encounter.   Current Outpatient Prescriptions on File Prior to Encounter  Medication Sig Dispense Refill  . aspirin 81 MG tablet Take 81 mg by mouth daily.        . benazepril (LOTENSIN) 10 MG tablet Take 1 tablet (10 mg total) by mouth daily.  90 tablet  3  . chlorhexidine  (PERIDEX) 0.12 % solution Use as directed 15 mLs in the mouth or throat 2 (two) times daily. Use as directed for mouth irritation      . colchicine 0.6 MG tablet Take 0.6 mg by mouth daily as needed (for gout flares).      . diclofenac (VOLTAREN) 75 MG EC tablet Take 1 tablet (75 mg total) by mouth 2 (two) times daily as needed.  180 tablet  0  . diclofenac sodium (VOLTAREN) 1 % GEL Apply 4 g topically 4 (four) times daily.  100 g  2  . diphenoxylate-atropine (LOMOTIL) 2.5-0.025 MG per tablet Take 2 tablets by mouth 4 (four) times daily as needed for diarrhea or loose stools.  60 tablet  1  . doxazosin (CARDURA) 8 MG tablet Take 4 mg by mouth 2 (two) times daily.       . hydrochlorothiazide (HYDRODIURIL) 25 MG tablet TAKE 1 TABLET BY MOUTH EVERY DAY  90 tablet  3  . isosorbide mononitrate (IMDUR) 60 MG 24 hr tablet TAKE 1 TABLET BY MOUTH EVERY DAY  90 tablet  3  . lidocaine-prilocaine (EMLA) cream Apply topically as needed. Prior to chemotherapy treatment, Apply approx 1/2 teaspoon to skin over port, do not rub in. Cover with plastic,  30 g  1  . loratadine (CLARITIN) 10 MG tablet Take 10 mg by mouth daily.        . metoprolol succinate (TOPROL-XL) 50 MG 24 hr tablet Take 50 mg by mouth daily. Take with or immediately following a meal.      . Multiple Vitamin (MULTIVITAMIN) tablet Take 1 tablet by mouth daily.        . nitroGLYCERIN (NITROSTAT) 0.4 MG SL tablet Place 0.4 mg under the tongue every 5 (five) minutes as needed.        Bertram Gala Glycol-Propyl Glycol (SYSTANE OP) Place 1 drop into both eyes daily.       . prochlorperazine (COMPAZINE) 10 MG tablet Take 1 tablet (10 mg total) by mouth every 6 (six) hours as needed.  30 tablet  1  . gabapentin (NEURONTIN) 300 MG capsule Take 300 mg by mouth as needed.        Objective: BP 148/83  Pulse 70  Temp(Src) 98 F (36.7 C) (Oral)  Resp 16  Ht 5' 9.5" (1.765 m)  Wt 208 lb 14.4 oz (94.756 kg)  BMI 30.42 kg/m2  SpO2 94% Exam: General: NAD.  98% RA while in room and talking. Sitting at the side of the bed. Pleasant gentlemen.  HEENT: AT.[place]. MMM. Bilateral eyes without injections or icterus. Bilateral nares with mild irritation,  no blood noted, no drainage.  Cardiovascular: RRR. 2/6 SM Respiratory: Mild LLL wheeze, no crackles or rhonchi. Good air movement.  Abdomen: Soft. NTND. Colostomy bag in place, without leakage, erythema or signs of skin breakdown.  Extremities: Bilateral +3/4 pitting edema LE. Pulses are equal. Brisk cap refill. No erythema or tenderness noted.  Skin: No rashes or skin breakdown noted.  Neuro: Alert. Oriented. PERLA. EOMI. CN grossly intact. No focal deficits.   Labs and Imaging: CBC BMET   Recent Labs Lab 04/01/13 1350  WBC 6.3  HGB 11.6*  HCT 35.5*  PLT 149*    Recent Labs Lab 04/01/13 1350  NA 139  K 4.3  CL 104  CO2 26  BUN 15  CREATININE 0.90  GLUCOSE 108*  CALCIUM 9.6      Dg Chest 2 View  04/01/2013   *RADIOLOGY REPORT*  Clinical Data: Shortness of breath.  CHEST - 2 VIEW  Comparison: Chest x-ray 03/09/2013.  Findings: Right internal jugular single lumen power Port-A-Cath with tip terminating in the distal superior vena cava.  Lung volumes are low, and there are slightly increasing bibasilar opacities which may simply reflect subsegmental atelectasis or scarring, however, sequelae of aspiration or early bronchopneumonia is difficult to exclude.  Small bilateral pleural effusions.  No evidence of pulmonary edema.  Heart size is within normal limits. Mediastinal contours are unremarkable.  Atherosclerosis of the thoracic aorta.  IMPRESSION: 1.  Persistent small bilateral pleural effusions. 2.  Bibasilar opacities slightly increased, concerning for sequela of recent aspiration or developing bronchopneumonia, although there is likely some component of underlying bibasilar atelectasis/scarring.   Original Report Authenticated By: Trudie Reed, M.D.     Natalia Leatherwood, DO 04/01/2013, 11:11  PM PGY-2, [facility] FPTS Intern pager: [phone], text pages welcome

## 2013-04-01 NOTE — ED Notes (Signed)
Antibiotic still infusing on [facility] arrival.

## 2013-04-01 NOTE — ED Notes (Signed)
Sob for two weeks, worsening over time.  No changes in peripheral edema, pt states that usually doesn't change.  Pt speaking sentences.  Pt seen by Md one week ago, told slight fluid on left lung.  Pt is chemo patient for colon cancer.

## 2013-04-01 NOTE — ED Notes (Signed)
Port incision to right upper chest wall.

## 2013-04-01 NOTE — ED Notes (Signed)
Patient is alert, pleasant, and oriented.  C/o progressive SHOB with exertion x three days.  Reports that [facility] improves with rest.  Denies chest pain, dizziness, diaphoresis.  Is currently on chemotherapy for Stage 4 Colon CA.  Last treatment was July 3rd.  Hx of CHF, Cardiac Cath.

## 2013-04-02 ENCOUNTER — Inpatient Hospital Stay (HOSPITAL_COMMUNITY): Payer: Medicare Other

## 2013-04-02 ENCOUNTER — Encounter (HOSPITAL_COMMUNITY): Payer: Self-pay | Admitting: Radiology

## 2013-04-02 DIAGNOSIS — I2699 Other pulmonary embolism without acute cor pulmonale: Secondary | ICD-10-CM | POA: Diagnosis present

## 2013-04-02 DIAGNOSIS — I2510 Atherosclerotic heart disease of native coronary artery without angina pectoris: Secondary | ICD-10-CM

## 2013-04-02 DIAGNOSIS — C189 Malignant neoplasm of colon, unspecified: Secondary | ICD-10-CM

## 2013-04-02 DIAGNOSIS — I5043 Acute on chronic combined systolic (congestive) and diastolic (congestive) heart failure: Secondary | ICD-10-CM | POA: Diagnosis present

## 2013-04-02 DIAGNOSIS — I5189 Other ill-defined heart diseases: Secondary | ICD-10-CM | POA: Diagnosis present

## 2013-04-02 DIAGNOSIS — I5023 Acute on chronic systolic (congestive) heart failure: Secondary | ICD-10-CM

## 2013-04-02 DIAGNOSIS — C787 Secondary malignant neoplasm of liver and intrahepatic bile duct: Secondary | ICD-10-CM

## 2013-04-02 DIAGNOSIS — R0609 Other forms of dyspnea: Secondary | ICD-10-CM

## 2013-04-02 DIAGNOSIS — I219 Acute myocardial infarction, unspecified: Secondary | ICD-10-CM

## 2013-04-02 DIAGNOSIS — E43 Unspecified severe protein-calorie malnutrition: Secondary | ICD-10-CM | POA: Insufficient documentation

## 2013-04-02 DIAGNOSIS — I509 Heart failure, unspecified: Secondary | ICD-10-CM

## 2013-04-02 DIAGNOSIS — I2589 Other forms of chronic ischemic heart disease: Secondary | ICD-10-CM

## 2013-04-02 LAB — PROTIME-INR: Prothrombin Time: 15.4 seconds — ABNORMAL HIGH (ref 11.6–15.2)

## 2013-04-02 MED ORDER — PATIENT'S GUIDE TO USING COUMADIN BOOK
Freq: Once | Status: AC
  Administered 2013-04-02: 15:00:00
  Filled 2013-04-02: qty 1

## 2013-04-02 MED ORDER — IOHEXOL 350 MG/ML SOLN
80.0000 mL | Freq: Once | INTRAVENOUS | Status: AC | PRN
  Administered 2013-04-02: 01:00:00 53 mL via INTRAVENOUS

## 2013-04-02 MED ORDER — WARFARIN VIDEO: Freq: Once | Status: DC

## 2013-04-02 MED ORDER — PIPERACILLIN-TAZOBACTAM 3.375 G IVPB
3.3750 g | Freq: Three times a day (TID) | INTRAVENOUS | Status: DC
  Administered 2013-04-02: 02:00:00 3.375 g via INTRAVENOUS
  Filled 2013-04-02 (×3): qty 50

## 2013-04-02 MED ORDER — ENOXAPARIN SODIUM 100 MG/ML ~~LOC~~ SOLN
1.0000 mg/kg | Freq: Once | SUBCUTANEOUS | Status: AC
  Administered 2013-04-02: 02:00:00 95 mg via SUBCUTANEOUS
  Filled 2013-04-02: qty 1

## 2013-04-02 MED ORDER — SODIUM CHLORIDE 0.9 % IV SOLN
1500.0000 mg | INTRAVENOUS | Status: DC
  Administered 2013-04-02: 1500 mg via INTRAVENOUS
  Filled 2013-04-02: qty 1500

## 2013-04-02 MED ORDER — ENOXAPARIN SODIUM 100 MG/ML ~~LOC~~ SOLN
1.0000 mg/kg | Freq: Two times a day (BID) | SUBCUTANEOUS | Status: DC
  Administered 2013-04-02 – 2013-04-03 (×2): 95 mg via SUBCUTANEOUS
  Filled 2013-04-02 (×4): qty 1

## 2013-04-02 MED ORDER — WARFARIN SODIUM 7.5 MG PO TABS
7.5000 mg | ORAL_TABLET | Freq: Once | ORAL | Status: AC
  Administered 2013-04-02: 7.5 mg via ORAL
  Filled 2013-04-02: qty 1

## 2013-04-02 MED ORDER — SODIUM CHLORIDE 0.9 % IJ SOLN
10.0000 mL | INTRAMUSCULAR | Status: DC | PRN
  Administered 2013-04-03 – 2013-04-05 (×4): 10 mL

## 2013-04-02 MED ORDER — WARFARIN - PHARMACIST DOSING INPATIENT: Freq: Every day | Status: DC

## 2013-04-02 MED ORDER — PATIENT'S GUIDE TO USING COUMADIN BOOK
Freq: Once | Status: DC
  Filled 2013-04-02: qty 1

## 2013-04-02 NOTE — Progress Notes (Signed)
.  I discussed with  Dr Gayla Doss.  I agree with their plans documented in their progress note for today. See Dr Marinell Blight H&P note for details.

## 2013-04-02 NOTE — Progress Notes (Signed)
Family Medicine Teaching Service Daily Progress Note Intern Pager: [phone]  Patient name: Todd [name] Medical record number: [id number] Date of birth: 04/15/1930 Age: 79 y.o. Gender: male  Primary Care Provider: [name], MD Consultants: None Code Status: Full  Pt Overview and Major Events to Date:   9/7: CT Angio: PE in LLL w/ b/l pleural effusions; BNP 3350  Assessment and Plan: Todd [name] is a 79 y.o. male presenting with ~ 1 week history of increasing fatigue and recent dyspnea . PMH is significant for Colon cancer, cardiomyopathy, HTN, CHF and gout. Undergoing chemotherapy. He was recently on a chemo break d/t "blood levels" and tolerance. His last chemo treatment was July 3rd, they started in March of 2014.  CHF: Patient with a BNP 3350 on admission (no prior to compare). Lasix 60 IV given at [facility] and patient reported great relief. Last ECHO 09/2012 Ef 35-40%. Systolic function was moderately reduced with severe hypokinesis of the mid-distal anteroseptal, anterior, inferoseptal and apical myocardium. Grade 2 diastolic dysfunction. Elevated left atrial filling pressure. Mild AS, AR and TR. Moderate to severe MR. LA mildly dilated. RV pressure increased. PA peak pressure: 51mm Hg (S).  - Strict I/O; daily weights. Dry weight ~200-205. Considering recent chemotherapy he has not had an appetite and weights have varied considerably.   - Wt 9/8: 206.4; Good UOP (2.8 cc/kg/hr) - Fluid restriction to 1.5 liter water  - Lasix 40 mg IV this am 9/8 - Will call Cards (SE) today - Repeat labs: BNP: Inc 3608  # Possible Pneumonia: Unlikely Patient had a CXR at [facility] concerning for possible PNA (see below). Patient's last chemo treatment (in hospital was July 3rd). He did not present with fever or elevated WBC, although he likely would not from his chemo treatments. Considering his history and immunosuppression he was started on vanc/zosyn (9/7) for HCAP at the [facility] ED. - D/c'd Vanc/zosyn 9/8 -  Will continue to monitor for signs of infection - Repeat CBC pending  #PE:   - A D-dimer was performed at [facility] and resulted with 1.95; a CTA (+) for PE;  Patient has not been tachycardic and has maintained good oxygenation on RA. VSS.  - Continue to monitor O2 levels; BP stable  - INR 1.25 9/8 Subtheraputic  - Continue: Lovenox; Warfarin - Will discuss pro and cons with pt today - Will contact his Oncologist today for recs: Dr Clelia Croft [phone] out of office; back 9/9  Hypertension: Patient has had stable vitals.  - Continue current home medications ASA, benazepril, cardura (BPH), HCTZ, metoprolol, Zocor   Diabetes:History of diabetes, without current medications. BG have been stable this admission.  - Will continue to monitor   Epistaxis: Since the start of chemo patient has had frequent epistaxis. He reports daily use of saline nasal drops has helped reduce the amount of nose bleeds.  - will continue to monitor for bleeding  - Saline nasal drops ordered for comfort   FEN/GI:  Heart healthy diet.  Fluid restriciton  Prophylaxis: Lovenox 40 mg   Disposition: discharge home with wife, pending clinical improvement  Subjective: [name] reports improvement in his SOB in morning and decreased LE edema with starting Lasix.  He is aware of his PE, and is unsure about whether or not he wants to continue with Anti Coag therapy. He will discuss with his wife this afternoon.   Objective: Temp:  [97.4 F (36.3 C)-98.1 F (36.7 C)] 97.4 F (36.3 C) (09/08 0500) Pulse Rate:  [67-75]  75 (09/08 0500) Resp:  [13-18] 18 (09/08 0500) BP: (119-156)/(72-92) 119/74 mmHg (09/08 0500) SpO2:  [94 %-99 %] 98 % (09/08 0500) Weight:  [206 lb 6.4 oz (93.622 kg)-218 lb (98.884 kg)] 206 lb 6.4 oz (93.622 kg) (09/08 0548)  Physical Exam:   General: NAD. NAD. Resting in bed. Pleasant gentlemen.  HEENT: AT.[place]. MMM. Bilateral eyes without injections or icterus. Bilateral nares with mild irritation, no  blood noted, no drainage.  Cardiovascular: RRR. 2/6 SM  Respiratory: Mild crackles in b/l lower lobes, No rhonchi. Good air movement.  Abdomen: Soft. NTND. Colostomy bag in place, without leakage, erythema or signs of skin breakdown.  Extremities: Improving Bilateral +2/4 pitting edema LE. Pulses are equal. Brisk cap refill. No erythema or tenderness noted.  Skin: No rashes or skin breakdown noted.  Neuro: Alert. Oriented. PERLA. EOMI. CN grossly intact. No focal deficits.   Laboratory:  Recent Labs Lab 03/28/13 0816 04/01/13 1350  WBC 6.5 6.3  HGB 12.2* 11.6*  HCT 36.3* 35.5*  PLT 150 149*    Recent Labs Lab 03/28/13 0816 04/01/13 1350  NA 144 139  K 4.3 4.3  CL  --  104  CO2 22 26  BUN 20.3 15  CREATININE 1.0 0.90  CALCIUM 9.2 9.6  PROT 6.6 6.7  BILITOT 1.10 0.9  ALKPHOS 44 47  ALT 15 16  AST 21 25  GLUCOSE 139 108*    Recent Labs Lab 04/01/13 1350 04/02/13 0500  PROBNP 3356.0* 3608.0*    Recent Labs Lab 04/02/13 0500  INR 1.25   Imaging/Diagnostic Tests: Dg Chest 2 View  04/01/2013 *RADIOLOGY REPORT* Clinical Data: Shortness of breath. CHEST - 2 VIEW Comparison: Chest x-ray 03/09/2013. Findings: Right internal jugular single lumen power Port-A-Cath with tip terminating in the distal superior vena cava. Lung volumes are low, and there are slightly increasing bibasilar opacities which may simply reflect subsegmental atelectasis or scarring, however, sequelae of aspiration or early bronchopneumonia is difficult to exclude. Small bilateral pleural effusions. No evidence of pulmonary edema. Heart size is within normal limits. Mediastinal contours are unremarkable. Atherosclerosis of the thoracic aorta. IMPRESSION: 1. Persistent small bilateral pleural effusions. 2. Bibasilar opacities slightly increased, concerning for sequela of recent aspiration or developing bronchopneumonia, although there is likely some component of underlying bibasilar atelectasis/scarring.     CT ANGIOGRAPHY CHEST Positive study for pulmonary emboli and peripheral left lower lobe  pulmonary arteries. Bilateral pleural effusions and basilar  atelectasis are increasing since previous study. Diffuse ground  glass parenchymal changes likely represent edema. Passive  congestion in the liver. 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  Wenda Low, MD 04/02/2013, 6:31 AM PGY-1, [facility] FPTS Intern pager: [phone], text pages welcome

## 2013-04-02 NOTE — Progress Notes (Signed)
INITIAL NUTRITION ASSESSMENT  DOCUMENTATION CODES Per approved criteria  -Severe malnutrition in the context of chronic illness -Obesity Unspecified   INTERVENTION: Continue Ensure Complete daily. RD to continue to follow nutrition care plan.  NUTRITION DIAGNOSIS: Inadequate oral intake related to poor appetite and early satiety as evidenced by pt report and severe muscle/fat mass loss.   Goal: Intake to meet >90% of estimated nutrition needs.  Monitor:  weight trends, lab trends, I/O's, PO intake, supplement tolerance  Reason for Assessment: MD Consult  79 y.o. male  Admitting Dx: PE  ASSESSMENT: PMHx significant for colon CA, HTN, CHF, gout. Admitted with dyspnea, fatigue, weakness and cough that has progressed over the past week; also endorses poor appetite. Work-up reveals PE.  Recently undergoing chemo; last treatment was 7/3. Pt reports poor appetite 2/2 SOB and early satiety x 4-5 weeks. Pt reports that he is eating 1/4 of what he normally eats. He also has been drinking on average 1 Ensure daily since February. Follows a No-Added Salt and Low Fiber diet at baseline. Discussed weight trends - pt with fluctuating weights 2/2 fluid status. Endorses muscle mass loss.  Nutrition Focused Physical Exam:  Subcutaneous Fat:  Orbital Region: moderate depletion Upper Arm Region: severe depletion Thoracic and Lumbar Region: n/a  Muscle:  Temple Region: severe depletion Clavicle Bone Region: severe depletion Clavicle and Acromion Bone Region: severe depletion Scapular Bone Region: n/a Dorsal Hand: n/a Patellar Region: n/a Anterior Thigh Region: n/a Posterior Calf Region: n/a  Edema: significant LE edema  Pt meets criteria for severe MALNUTRITION in the context of chronic illness as evidenced by severe muscle mass loss, severe fat mass loss, and intake of <75% x at least 1 month.   Height: Ht Readings from Last 1 Encounters:  04/01/13 5' 9.5" (1.765 m)     Weight: Wt Readings from Last 1 Encounters:  04/02/13 206 lb 6.4 oz (93.622 kg)    Ideal Body Weight: 163 lb  % Ideal Body Weight: 126%  Wt Readings from Last 25 Encounters:  04/02/13 206 lb 6.4 oz (93.622 kg)  03/28/13 213 lb 6.4 oz (96.798 kg)  03/20/13 208 lb (94.348 kg)  03/09/13 209 lb (94.802 kg)  02/09/13 206 lb (93.441 kg)  02/07/13 203 lb 12.8 oz (92.443 kg)  01/24/13 197 lb 8 oz (89.585 kg)  01/11/13 207 lb (93.895 kg)  01/10/13 201 lb 9.6 oz (91.445 kg)  01/02/13 203 lb (92.08 kg)  12/27/12 205 lb (92.987 kg)  12/22/12 198 lb (89.812 kg)  12/13/12 210 lb 14.4 oz (95.664 kg)  11/29/12 208 lb 14.4 oz (94.756 kg)  11/15/12 211 lb 12.8 oz (96.072 kg)  11/01/12 214 lb 4.8 oz (97.206 kg)  10/17/12 215 lb 14.4 oz (97.932 kg)  09/22/12 209 lb (94.802 kg)  09/20/12 206 lb 4.8 oz (93.577 kg)  09/19/12 209 lb 8 oz (95.029 kg)  09/18/12 210 lb (95.255 kg)  09/11/12 224 lb 13.9 oz (102 kg)  09/11/12 224 lb 13.9 oz (102 kg)  09/11/12 224 lb 13.9 oz (102 kg)  07/07/12 216 lb (97.977 kg)   Usual Body Weight: 205 - 215 lb  % Usual Body Weight: 100%  BMI:  Body mass index is 30.05 kg/(m^2). Obese Class I  Estimated Nutritional Needs: Kcal: 1950-2200 Protein: 100 - 130 Fluid: approx 2 liters daily  Skin: intact  Diet Order: Cardiac  EDUCATION NEEDS: -No education needs identified at this time   Intake/Output Summary (Last 24 hours) at 04/02/13 1032 Last data filed  at 04/02/13 0904  Gross per 24 hour  Intake    460 ml  Output   5125 ml  Net  -4665 ml    Last BM: 9/8 - via colostomy  Labs:   Recent Labs Lab 03/28/13 0816 04/01/13 1350  NA 144 139  K 4.3 4.3  CL  --  104  CO2 22 26  BUN 20.3 15  CREATININE 1.0 0.90  CALCIUM 9.2 9.6  GLUCOSE 139 108*    CBG (last 3)   Recent Labs  04/01/13 2004  GLUCAP 122*    Scheduled Meds: . aspirin EC  81 mg Oral Daily  . benazepril  10 mg Oral Daily  . chlorhexidine  15 mL Mouth/Throat BID   . diclofenac sodium  4 g Topical QID  . doxazosin  4 mg Oral BID  . enoxaparin (LOVENOX) injection  1 mg/kg Subcutaneous Q12H  . feeding supplement  237 mL Oral Daily  . hydrochlorothiazide  25 mg Oral Daily  . isosorbide mononitrate  60 mg Oral Daily  . loratadine  10 mg Oral Daily  . metoprolol succinate  50 mg Oral Daily  . multivitamin with minerals  1 tablet Oral Daily  . patient's guide to using coumadin book   Does not apply Once  . simvastatin  20 mg Oral q1800  . warfarin  7.5 mg Oral ONCE-1800  . warfarin   Does not apply Once  . Warfarin - Pharmacist Dosing Inpatient   Does not apply q1800    Continuous Infusions:   Past Medical History  Diagnosis Date  . Myocardial infarction 1990    "Massive"- total LAD with collaterals  . Gout   . Hypertension   . Arthritis, rheumatoid   . Diabetes mellitus without complication   . CHF (congestive heart failure)   . BPH (benign prostatic hyperplasia)   . Neuropathy   . Hyperlipidemia   . Ischemic cardiomyopathy March 2012    EF 31% Myoview. Pt declines ICD  . Colon cancer 2014    Surg, chemo March 2014    Past Surgical History  Procedure Laterality Date  . Cataract extraction    . Flexible sigmoidoscopy N/A 09/05/2012    Procedure: FLEXIBLE SIGMOIDOSCOPY;  Surgeon: Barrie Folk, MD;  Location: [facility] ENDOSCOPY;  Service: Endoscopy;  Laterality: N/A;  Give 1000 cc tapwater enema on call to endoscopy.  . Colon resection N/A 09/07/2012    Procedure: LAPAROSCOPIC ASSISTED SIGMOID COLON RESECTION END OSTOMY;  Surgeon: Kandis Cocking, MD;  Location: [facility] OR;  Service: General;  Laterality: N/A;  . Portacath placement N/A 09/07/2012    Procedure: Placement of power port;  Surgeon: Kandis Cocking, MD;  Location: [facility] OR;  Service: General;  Laterality: N/A;    Jarold Motto MS, RD, LDN Pager: [phone] After-hours pager: [phone]

## 2013-04-02 NOTE — Progress Notes (Signed)
Utilization Review Completed Kaulder Zahner J. Chirstina Haan, RN, BSN, NCM [phone]  

## 2013-04-02 NOTE — Consult Note (Signed)
Reason for Consult: CHF  Requesting Physician: Dr Mauricio Po  HPI: This is a 79 y.o. male with a past medical history significant for CAD, s/p remote MI in the 74's in [place], Rx'd with TPA. He had a cath sometime later showing an occluded LAD with collaterals. He had a low risk Myoview in March 2012. He has had colon cancer with metastasis. He underwent surgery and chemotherapy March 2014. An echo done in March 2014 showed his EF to be 35-40%. He is admitted now with complaints of fatigue and dyspnea. A CTA showed Lt lower lobe and peripheral pulmonary embolism as well as CHF. He is improved after diuresis. We are asked for our input regarding his CHF.  PMHx:  Past Medical History  Diagnosis Date  . Myocardial infarction 1990    "Massive"- total LAD with collaterals  . Gout   . Hypertension   . Arthritis, rheumatoid   . Diabetes mellitus without complication   . CHF (congestive heart failure)   . BPH (benign prostatic hyperplasia)   . Neuropathy   . Hyperlipidemia   . Ischemic cardiomyopathy March 2012    EF 31% Myoview. Pt declines ICD  . Colon cancer 2014    Surg, chemo March 2014   Past Surgical History  Procedure Laterality Date  . Cataract extraction    . Flexible sigmoidoscopy N/A 09/05/2012    Procedure: FLEXIBLE SIGMOIDOSCOPY;  Surgeon: Barrie Folk, MD;  Location: [facility] ENDOSCOPY;  Service: Endoscopy;  Laterality: N/A;  Give 1000 cc tapwater enema on call to endoscopy.  . Colon resection N/A 09/07/2012    Procedure: LAPAROSCOPIC ASSISTED SIGMOID COLON RESECTION END OSTOMY;  Surgeon: Kandis Cocking, MD;  Location: [facility] OR;  Service: General;  Laterality: N/A;  . Portacath placement N/A 09/07/2012    Procedure: Placement of power port;  Surgeon: Kandis Cocking, MD;  Location: [facility] OR;  Service: General;  Laterality: N/A;    FAMHx: DM, CAD- father   SOCHx:  reports that he quit smoking about 4 years ago. He has never used smokeless tobacco. He reports that he does not drink alcohol  or use illicit drugs.  ALLERGIES: No Known Allergies  ROS: Pertinent items are noted in HPI. he denies any chest pain. He has had problems with nose bleeds when he was getting chemo therapy. He was scheduled to have a mainteneance round of chemo next week. He has declined ICD in the past and tells me he is "stage 4" in regards to his cancer.  HOME MEDICATIONS: Prescriptions prior to admission  Medication Sig Dispense Refill  . amoxicillin (AMOXIL) 500 MG tablet Take 500 mg by mouth 3 (three) times daily.      Marland Kitchen aspirin 81 MG tablet Take 81 mg by mouth daily.        . benazepril (LOTENSIN) 10 MG tablet Take 1 tablet (10 mg total) by mouth daily.  90 tablet  3  . chlorhexidine (PERIDEX) 0.12 % solution Use as directed 15 mLs in the mouth or throat 2 (two) times daily. Use as directed for mouth irritation      . colchicine 0.6 MG tablet Take 0.6 mg by mouth daily as needed (for gout flares).      . diclofenac (VOLTAREN) 75 MG EC tablet Take 1 tablet (75 mg total) by mouth 2 (two) times daily as needed.  180 tablet  0  . diclofenac sodium (VOLTAREN) 1 % GEL Apply 4 g topically 4 (four) times daily.  100 g  2  .  diphenoxylate-atropine (LOMOTIL) 2.5-0.025 MG per tablet Take 2 tablets by mouth 4 (four) times daily as needed for diarrhea or loose stools.  60 tablet  1  . doxazosin (CARDURA) 8 MG tablet Take 4 mg by mouth 2 (two) times daily.       . hydrochlorothiazide (HYDRODIURIL) 25 MG tablet TAKE 1 TABLET BY MOUTH EVERY DAY  90 tablet  3  . isosorbide mononitrate (IMDUR) 60 MG 24 hr tablet TAKE 1 TABLET BY MOUTH EVERY DAY  90 tablet  3  . lidocaine-prilocaine (EMLA) cream Apply topically as needed. Prior to chemotherapy treatment, Apply approx 1/2 teaspoon to skin over port, do not rub in. Cover with plastic,  30 g  1  . loratadine (CLARITIN) 10 MG tablet Take 10 mg by mouth daily.        . metoprolol succinate (TOPROL-XL) 50 MG 24 hr tablet Take 50 mg by mouth daily. Take with or immediately  following a meal.      . Multiple Vitamin (MULTIVITAMIN) tablet Take 1 tablet by mouth daily.        . nitroGLYCERIN (NITROSTAT) 0.4 MG SL tablet Place 0.4 mg under the tongue every 5 (five) minutes as needed.        . Nutritional Supplements (ENSURE PO) Take 1 Can by mouth daily.      Bertram Gala Glycol-Propyl Glycol (SYSTANE OP) Place 1 drop into both eyes daily.       . prochlorperazine (COMPAZINE) 10 MG tablet Take 1 tablet (10 mg total) by mouth every 6 (six) hours as needed.  30 tablet  1  . simvastatin (ZOCOR) 40 MG tablet Take 20 mg by mouth every morning.      . torsemide (DEMADEX) 20 MG tablet Take 20 mg by mouth every evening.      Marland Kitchen VITAMIN E PO Take 1 capsule by mouth at bedtime.      . gabapentin (NEURONTIN) 300 MG capsule Take 300 mg by mouth as needed.        HOSPITAL MEDICATIONS: I have reviewed the patient's current medications.  VITALS: Blood pressure 103/48, pulse 60, temperature 97.6 F (36.4 C), temperature source Oral, resp. rate 20, height 5' 9.5" (1.765 m), weight 206 lb 6.4 oz (93.622 kg), SpO2 98.00%.  PHYSICAL EXAM: General appearance: alert, cooperative and no distress Neck: no carotid bruit and no JVD Lungs: clear to auscultation bilaterally Heart: regular rate and rhythm Extremities: 2+ pre tibial pitting edema Skin: pale, cool, dry Neurologic: Grossly normal  LABS: Results for orders placed during the hospital encounter of 04/01/13 (from the past 48 hour(s))  CBC WITH DIFFERENTIAL     Status: Abnormal   Collection Time    04/01/13  1:50 PM      Result Value Range   WBC 6.3  4.0 - 10.5 K/uL   RBC 3.79 (*) 4.22 - 5.81 MIL/uL   Hemoglobin 11.6 (*) 13.0 - 17.0 g/dL   HCT 91.4 (*) 78.2 - 95.6 %   MCV 93.7  78.0 - 100.0 fL   MCH 30.6  26.0 - 34.0 pg   MCHC 32.7  30.0 - 36.0 g/dL   RDW 21.3  08.6 - 57.8 %   Platelets 149 (*) 150 - 400 K/uL   Neutrophils Relative % 74  43 - 77 %   Neutro Abs 4.7  1.7 - 7.7 K/uL   Lymphocytes Relative 17  12 - 46 %    Lymphs Abs 1.1  0.7 - 4.0 K/uL   Monocytes  Relative 7  3 - 12 %   Monocytes Absolute 0.5  0.1 - 1.0 K/uL   Eosinophils Relative 2  0 - 5 %   Eosinophils Absolute 0.1  0.0 - 0.7 K/uL   Basophils Relative 1  0 - 1 %   Basophils Absolute 0.0  0.0 - 0.1 K/uL  COMPREHENSIVE METABOLIC PANEL     Status: Abnormal   Collection Time    04/01/13  1:50 PM      Result Value Range   Sodium 139  135 - 145 mEq/L   Potassium 4.3  3.5 - 5.1 mEq/L   Chloride 104  96 - 112 mEq/L   CO2 26  19 - 32 mEq/L   Glucose, Bld 108 (*) 70 - 99 mg/dL   BUN 15  6 - 23 mg/dL   Creatinine, Ser 1.61  0.50 - 1.35 mg/dL   Calcium 9.6  8.4 - 09.6 mg/dL   Total Protein 6.7  6.0 - 8.3 g/dL   Albumin 3.5  3.5 - 5.2 g/dL   AST 25  0 - 37 U/L   ALT 16  0 - 53 U/L   Alkaline Phosphatase 47  39 - 117 U/L   Total Bilirubin 0.9  0.3 - 1.2 mg/dL   GFR calc non Af Amer 78 (*) >90 mL/min   GFR calc Af Amer >90  >90 mL/min   Comment: (NOTE)     The eGFR has been calculated using the CKD EPI equation.     This calculation has not been validated in all clinical situations.     eGFR's persistently <90 mL/min signify possible Chronic Kidney     Disease.  TROPONIN I     Status: None   Collection Time    04/01/13  1:50 PM      Result Value Range   Troponin I <0.30  <0.30 ng/mL   Comment:            Due to the release kinetics of cTnI,     a negative result within the first hours     of the onset of symptoms does not rule out     myocardial infarction with certainty.     If myocardial infarction is still suspected,     repeat the test at appropriate intervals.  D-DIMER, QUANTITATIVE     Status: Abnormal   Collection Time    04/01/13  1:50 PM      Result Value Range   D-Dimer, Quant 1.95 (*) 0.00 - 0.48 ug/mL-FEU   Comment:            AT THE INHOUSE ESTABLISHED CUTOFF     VALUE OF 0.48 ug/mL FEU,     THIS ASSAY HAS BEEN DOCUMENTED     IN THE LITERATURE TO HAVE     A SENSITIVITY AND NEGATIVE     PREDICTIVE VALUE OF AT  LEAST     98 TO 99%.  THE TEST RESULT     SHOULD BE CORRELATED WITH     AN ASSESSMENT OF THE CLINICAL     PROBABILITY OF DVT / VTE.  PRO B NATRIURETIC PEPTIDE     Status: Abnormal   Collection Time    04/01/13  1:50 PM      Result Value Range   Pro B Natriuretic peptide (BNP) 3356.0 (*) 0 - 450 pg/mL  GLUCOSE, CAPILLARY     Status: Abnormal   Collection Time    04/01/13  8:04 PM  Result Value Range   Glucose-Capillary 122 (*) 70 - 99 mg/dL  PRO B NATRIURETIC PEPTIDE     Status: Abnormal   Collection Time    04/02/13  5:00 AM      Result Value Range   Pro B Natriuretic peptide (BNP) 3608.0 (*) 0 - 450 pg/mL  PROTIME-INR     Status: Abnormal   Collection Time    04/02/13  5:00 AM      Result Value Range   Prothrombin Time 15.4 (*) 11.6 - 15.2 seconds   INR 1.25  0.00 - 1.49    EKG: NSR, AS Qs, PVCs  IMAGING: Dg Chest 2 View  04/01/2013   *RADIOLOGY REPORT*  Clinical Data: Shortness of breath.  CHEST - 2 VIEW  Comparison: Chest x-ray 03/09/2013.  Findings: Right internal jugular single lumen power Port-A-Cath with tip terminating in the distal superior vena cava.  Lung volumes are low, and there are slightly increasing bibasilar opacities which may simply reflect subsegmental atelectasis or scarring, however, sequelae of aspiration or early bronchopneumonia is difficult to exclude.  Small bilateral pleural effusions.  No evidence of pulmonary edema.  Heart size is within normal limits. Mediastinal contours are unremarkable.  Atherosclerosis of the thoracic aorta.  IMPRESSION: 1.  Persistent small bilateral pleural effusions. 2.  Bibasilar opacities slightly increased, concerning for sequela of recent aspiration or developing bronchopneumonia, although there is likely some component of underlying bibasilar atelectasis/scarring.   Original Report Authenticated By: Trudie Reed, M.D.   Ct Angio Chest Pe W/cm &/or Wo Cm  04/02/2013   *RADIOLOGY REPORT*  Clinical Data: Shortness of  breath and elevated D-dimer.  History of MI, hypertension, diabetes, CHF, BPH, ischemic cardiomyopathy, and colon cancer.  CT ANGIOGRAPHY CHEST  Technique:  Multidetector CT imaging of the chest using the standard protocol during bolus administration of intravenous contrast. Multiplanar reconstructed images including MIPs were obtained and reviewed to evaluate the vascular anatomy.  Contrast: 53mL OMNIPAQUE IOHEXOL 350 MG/ML SOLN  Comparison: 02/05/2013  Findings: Technically adequate study with good opacification of the central and segmental pulmonary arteries. Focal filling defects are demonstrated and left lower lobe pulmonary arteries consistent with peripheral pulmonary emboli.  These were not present on previous studies.  Bilateral pleural effusions and basilar atelectasis / consolidation or increasing since the previous study.  Focal area of consolidation or scarring in the inferior lingular segment of the left lung is stable.  Diffuse patchy ground-glass opacities throughout the lungs, new since previous study, possibly representing edema.  Airways appear patent.  Cardiac enlargement.  Coronary artery calcification.  Normal caliber thoracic aorta with calcification.  No significant lymphadenopathy in the chest.  Visualized portions of the upper abdominal organs again demonstrated a hypodense masses in the liver consistent with metastases.  Stable right adrenal gland nodule.  Reflux of contrast material into the inferior vena cava and hepatic veins consistent with passive congestion.  Degenerative changes in the thoracic spine.  No destructive bone lesions appreciated.  IMPRESSION: Positive study for pulmonary emboli and peripheral left lower lobe pulmonary arteries.  Bilateral pleural effusions and basilar atelectasis are increasing since previous study.  Diffuse ground glass parenchymal changes likely represent edema.  Passive congestion in the liver.  Hepatic and right adrenal gland metastases are again  demonstrated.  Results were discussed by telephone with Casimiro Needle, the patient's nurse on [facility] 4E, at 0104 hours on 04/02/2013.   Original Report Authenticated By: Burman Nieves, M.D.    IMPRESSION: Active Problems:   Acute pulmonary  embolism- 04/02/13   Acute on chronic combined systolic and diastolic congestive heart failure   DIABETES MELLITUS, CONTROLLED, WITHOUT COMPLICATIONS   Ischemic cardiomyopathy, EF 35-40% by echo March 2014   CAD, Ant MI 1990- total LAD with collaterals, Myoview low risk 3/12   Colon cancer metastasized to liver   Diastolic dysfunction- grade 24 September 2012   HYPERLIPIDEMIA   HYPERTENSION, BENIGN SYSTEMIC   HYPERTENSION   Myocardial infarction'1995   Protein-calorie malnutrition, severe   RECOMMENDATION: Will discuss his medications with Dr Allyson Sabal. ? If he is a candidate for an IVC filter.  Time Spent Directly with Patient: 45 minutes  Abelino Derrick [phone] beeper 04/02/2013, 4:10 PM  Agree with note written by Corine Shelter [facility]  Admitted with PE and CHF. D-Dimer + as well as CTA. BNP approx 3000. 2D shows LVD with EF 35%. No CP. Exam benign. Agree with Lovenox/coumadin A/C. Could also use Xarelto. Will need to discuss with his Oncologist about long term Lovenox with PE in setting of CA. Continue IV diuresis. Otherwise on approp meds. Will follow with you.   Runell Gess 04/02/2013 5:01 PM

## 2013-04-02 NOTE — H&P (Signed)
FMTS Attending Admit Note Todd [name] was seen and examined by me this morning, discussed with resident team and I agree with Todd [name] assess/plan as written, with following additions: 1. Since the H&P was written CTA has confirmed diagnosis of PE; to inform his Oncologist, Todd Clelia Croft, for input regarding anticoagulation regimen and possible postponement of his next round of chemotx, which was scheduled for this Weds, Sept 10th.  Todd [name] has had recurrent epistaxis and had cautery of R naris during last chemotx; had a significant nosebleed from L naris 3 days ago.  2. He has known systolic HF with EF of 35-40% on last ECHO approximately 6 months ago.  His cardiologist is Todd Allyson Sabal; to consult Cardiology for assistance in CHF management.  3. After reviewing CXR< I believe we can discontinue his abx, as I am not convinced of the PNA diagnosis.  Paula Compton, MD

## 2013-04-02 NOTE — Plan of Care (Signed)
Problem: Phase I Progression Outcomes Goal: EF % per last Echo/documented,Core Reminder form on chart Outcome: Completed/Met Date Met:  04/02/13 35-40%(10-17-12)

## 2013-04-02 NOTE — Progress Notes (Signed)
ANTICOAGULATION CONSULT NOTE - Initial Consult  Pharmacy Consult for LMWH and Coumadin Indication: pulmonary embolus  No Known Allergies  Patient Measurements: Height: 5' 9.5" (176.5 cm) Weight: 208 lb 14.4 oz (94.756 kg) (b scale) IBW/kg (Calculated) : 71.85   Vital Signs: Temp: 98.1 F (36.7 C) (09/08 0125) Temp src: Oral (09/08 0125) BP: 153/85 mmHg (09/08 0125) Pulse Rate: 68 (09/08 0125)  Labs:  Recent Labs  04/01/13 1350  HGB 11.6*  HCT 35.5*  PLT 149*  CREATININE 0.90  TROPONINI <0.30    Estimated Creatinine Clearance: 73.8 ml/min (by C-G formula based on Cr of 0.9).   Medical History: Past Medical History  Diagnosis Date  . Myocardial infarction 1990    "Massive"- total LAD with collaterals  . Gout   . Hypertension   . Arthritis, rheumatoid   . Diabetes mellitus without complication   . CHF (congestive heart failure)   . BPH (benign prostatic hyperplasia)   . Neuropathy   . Hyperlipidemia   . Ischemic cardiomyopathy March 2012    EF 31% Myoview. Pt declines ICD  . Colon cancer 2014    Surg, chemo March 2014    Medications:  Prescriptions prior to admission  Medication Sig Dispense Refill  . amoxicillin (AMOXIL) 500 MG tablet Take 500 mg by mouth 3 (three) times daily.      Marland Kitchen aspirin 81 MG tablet Take 81 mg by mouth daily.        . benazepril (LOTENSIN) 10 MG tablet Take 1 tablet (10 mg total) by mouth daily.  90 tablet  3  . chlorhexidine (PERIDEX) 0.12 % solution Use as directed 15 mLs in the mouth or throat 2 (two) times daily. Use as directed for mouth irritation      . colchicine 0.6 MG tablet Take 0.6 mg by mouth daily as needed (for gout flares).      . diclofenac (VOLTAREN) 75 MG EC tablet Take 1 tablet (75 mg total) by mouth 2 (two) times daily as needed.  180 tablet  0  . diclofenac sodium (VOLTAREN) 1 % GEL Apply 4 g topically 4 (four) times daily.  100 g  2  . diphenoxylate-atropine (LOMOTIL) 2.5-0.025 MG per tablet Take 2 tablets by  mouth 4 (four) times daily as needed for diarrhea or loose stools.  60 tablet  1  . doxazosin (CARDURA) 8 MG tablet Take 4 mg by mouth 2 (two) times daily.       . hydrochlorothiazide (HYDRODIURIL) 25 MG tablet TAKE 1 TABLET BY MOUTH EVERY DAY  90 tablet  3  . isosorbide mononitrate (IMDUR) 60 MG 24 hr tablet TAKE 1 TABLET BY MOUTH EVERY DAY  90 tablet  3  . lidocaine-prilocaine (EMLA) cream Apply topically as needed. Prior to chemotherapy treatment, Apply approx 1/2 teaspoon to skin over port, do not rub in. Cover with plastic,  30 g  1  . loratadine (CLARITIN) 10 MG tablet Take 10 mg by mouth daily.        . metoprolol succinate (TOPROL-XL) 50 MG 24 hr tablet Take 50 mg by mouth daily. Take with or immediately following a meal.      . Multiple Vitamin (MULTIVITAMIN) tablet Take 1 tablet by mouth daily.        . nitroGLYCERIN (NITROSTAT) 0.4 MG SL tablet Place 0.4 mg under the tongue every 5 (five) minutes as needed.        . Nutritional Supplements (ENSURE PO) Take 1 Can by mouth daily.      Marland Kitchen  Polyethyl Glycol-Propyl Glycol (SYSTANE OP) Place 1 drop into both eyes daily.       . prochlorperazine (COMPAZINE) 10 MG tablet Take 1 tablet (10 mg total) by mouth every 6 (six) hours as needed.  30 tablet  1  . simvastatin (ZOCOR) 40 MG tablet Take 20 mg by mouth every morning.      . torsemide (DEMADEX) 20 MG tablet Take 20 mg by mouth every evening.      Marland Kitchen VITAMIN E PO Take 1 capsule by mouth at bedtime.      . gabapentin (NEURONTIN) 300 MG capsule Take 300 mg by mouth as needed.        Assessment: Mr. Tully is an 79 yo man with + PE by CT angio.  His wt is 95 kg. His creat cl is > 30 ml/min.  His CBC is 11.6/35.5 and pltc 149 - a little low.  He will start LMWH and coumadin to treat PE.   Goal of Therapy:  INR 2-3 Anti-Xa level 0.6-1.2 units/ml 4hrs after LMWH dose given Monitor platelets by anticoagulation protocol: Yes   Plan:  Lovenox 1 mg/kg q12h = 95 mg sq q12 hrs - for a minimum of 5  days overlap with coumadin and until INR > 2 - must have minimum of 5 days of both LMWH and coumadin Coumadin 7.5 mg x 1 dose today Daily INR Coumadin teaching book and video ordered for education F/u bmet for renal function and CBC q72hrs to start on 04/04/13 Herby Abraham, Pharm.D. [phone] 04/02/2013 1:54 AM

## 2013-04-02 NOTE — Progress Notes (Signed)
ANTIBIOTIC CONSULT NOTE - INITIAL  Pharmacy Consult for Vancomycin and Zosyn  Indication: rule out pneumonia  No Known Allergies  Patient Measurements: Height: 5' 9.5" (176.5 cm) Weight: 208 lb 14.4 oz (94.756 kg) (b scale) IBW/kg (Calculated) : 71.85  Vital Signs: Temp: 98 F (36.7 C) (09/07 2052) Temp src: Oral (09/07 2052) BP: 148/83 mmHg (09/07 2052) Pulse Rate: 70 (09/07 2052) Intake/Output from previous day: 09/07 0701 - 09/08 0700 In: 50 [P.O.:50] Out: 2800 [Urine:2800] Intake/Output from this shift: Total I/O In: 50 [P.O.:50] Out: 1800 [Urine:1800]  Labs:  Recent Labs  04/01/13 1350  WBC 6.3  HGB 11.6*  PLT 149*  CREATININE 0.90   Estimated Creatinine Clearance: 73.8 ml/min (by C-G formula based on Cr of 0.9). No results found for this basename: VANCOTROUGH, VANCOPEAK, VANCORANDOM, GENTTROUGH, GENTPEAK, GENTRANDOM, TOBRATROUGH, TOBRAPEAK, TOBRARND, AMIKACINPEAK, AMIKACINTROU, AMIKACIN,  in the last 72 hours   Microbiology: No results found for this or any previous visit (from the past 720 hour(s)).  Medical History: Past Medical History  Diagnosis Date  . Myocardial infarction 1990    "Massive"- total LAD with collaterals  . Gout   . Hypertension   . Arthritis, rheumatoid   . Diabetes mellitus without complication   . CHF (congestive heart failure)   . BPH (benign prostatic hyperplasia)   . Neuropathy   . Hyperlipidemia   . Ischemic cardiomyopathy March 2012    EF 31% Myoview. Pt declines ICD  . Colon cancer 2014    Surg, chemo March 2014    Medications:  Prescriptions prior to admission  Medication Sig Dispense Refill  . amoxicillin (AMOXIL) 500 MG tablet Take 500 mg by mouth 3 (three) times daily.      Marland Kitchen aspirin 81 MG tablet Take 81 mg by mouth daily.        . benazepril (LOTENSIN) 10 MG tablet Take 1 tablet (10 mg total) by mouth daily.  90 tablet  3  . chlorhexidine (PERIDEX) 0.12 % solution Use as directed 15 mLs in the mouth or throat  2 (two) times daily. Use as directed for mouth irritation      . colchicine 0.6 MG tablet Take 0.6 mg by mouth daily as needed (for gout flares).      . diclofenac (VOLTAREN) 75 MG EC tablet Take 1 tablet (75 mg total) by mouth 2 (two) times daily as needed.  180 tablet  0  . diclofenac sodium (VOLTAREN) 1 % GEL Apply 4 g topically 4 (four) times daily.  100 g  2  . diphenoxylate-atropine (LOMOTIL) 2.5-0.025 MG per tablet Take 2 tablets by mouth 4 (four) times daily as needed for diarrhea or loose stools.  60 tablet  1  . doxazosin (CARDURA) 8 MG tablet Take 4 mg by mouth 2 (two) times daily.       . hydrochlorothiazide (HYDRODIURIL) 25 MG tablet TAKE 1 TABLET BY MOUTH EVERY DAY  90 tablet  3  . isosorbide mononitrate (IMDUR) 60 MG 24 hr tablet TAKE 1 TABLET BY MOUTH EVERY DAY  90 tablet  3  . lidocaine-prilocaine (EMLA) cream Apply topically as needed. Prior to chemotherapy treatment, Apply approx 1/2 teaspoon to skin over port, do not rub in. Cover with plastic,  30 g  1  . loratadine (CLARITIN) 10 MG tablet Take 10 mg by mouth daily.        . metoprolol succinate (TOPROL-XL) 50 MG 24 hr tablet Take 50 mg by mouth daily. Take with or immediately following a  meal.      . Multiple Vitamin (MULTIVITAMIN) tablet Take 1 tablet by mouth daily.        . nitroGLYCERIN (NITROSTAT) 0.4 MG SL tablet Place 0.4 mg under the tongue every 5 (five) minutes as needed.        . Nutritional Supplements (ENSURE PO) Take 1 Can by mouth daily.      Bertram Gala Glycol-Propyl Glycol (SYSTANE OP) Place 1 drop into both eyes daily.       . prochlorperazine (COMPAZINE) 10 MG tablet Take 1 tablet (10 mg total) by mouth every 6 (six) hours as needed.  30 tablet  1  . simvastatin (ZOCOR) 40 MG tablet Take 20 mg by mouth every morning.      . torsemide (DEMADEX) 20 MG tablet Take 20 mg by mouth every evening.      Marland Kitchen VITAMIN E PO Take 1 capsule by mouth at bedtime.      . gabapentin (NEURONTIN) 300 MG capsule Take 300 mg by  mouth as needed.       Assessment: 79 yo male with possible PNA for empiric antibiotics.  Vancomycin 1 g IV given in ED at 1715  Goal of Therapy:  Vancomycin trough level 15-20 mcg/ml  Plan:  Vancomycin 1500 mg IV q24h Zosyn 3.375 g IV q8h   Eddie Candle 04/02/2013,12:10 AM

## 2013-04-02 NOTE — Progress Notes (Signed)
Met with patient at bedside to explain [facility] Care Management services. Pleasantly declines [facility] Care Management. Left brochure for patient to call in the future in case he changes his mind.  Raiford Noble, MSN-Ed, RN,BSN- [facility] [phone]

## 2013-04-03 DIAGNOSIS — R040 Epistaxis: Secondary | ICD-10-CM

## 2013-04-03 DIAGNOSIS — I519 Heart disease, unspecified: Secondary | ICD-10-CM

## 2013-04-03 MED ORDER — ENOXAPARIN SODIUM 150 MG/ML ~~LOC~~ SOLN
140.0000 mg | SUBCUTANEOUS | Status: DC
  Administered 2013-04-04: 140 mg via SUBCUTANEOUS
  Filled 2013-04-03 (×2): qty 1

## 2013-04-03 MED ORDER — FUROSEMIDE 10 MG/ML IJ SOLN
40.0000 mg | Freq: Once | INTRAMUSCULAR | Status: AC
  Administered 2013-04-03: 40 mg via INTRAVENOUS
  Filled 2013-04-03: qty 4

## 2013-04-03 MED ORDER — ENOXAPARIN SODIUM 100 MG/ML ~~LOC~~ SOLN
95.0000 mg | Freq: Once | SUBCUTANEOUS | Status: AC
  Administered 2013-04-03: 95 mg via SUBCUTANEOUS
  Filled 2013-04-03: qty 1

## 2013-04-03 MED ORDER — WARFARIN SODIUM 7.5 MG PO TABS
7.5000 mg | ORAL_TABLET | Freq: Once | ORAL | Status: DC
  Filled 2013-04-03: qty 1

## 2013-04-03 NOTE — Progress Notes (Signed)
I have seen and examined this patient. I have discussed with Dr Joyner.  I agree with their findings and plans as documented in their progress note.    

## 2013-04-03 NOTE — Care Management Note (Addendum)
  Page 2 of 2   04/03/2013     3:36:12 PM   CARE MANAGEMENT NOTE 04/03/2013  Patient:  DRAPER, GALLON   Account Number:  [id number]  Date Initiated:  04/02/2013  Documentation initiated by:  [facility]  Subjective/Objective Assessment:   79 y.o. male presenting with   1 week history of increasing fatigue and recent dyspnea . PMH is significant for Colon cancer, cardiomyopathy, HTN, CHF and gout. //home with spouse     Action/Plan:   IV antibx, coumadin//home with self care   Anticipated DC Date:  04/05/2013   Anticipated DC Plan:  HOME/SELF CARE      DC Planning Services  CM consult      Choice offered to / List presented to:             Status of service:   Medicare Important Message given?   (If response is "NO", the following Medicare IM given date fields will be blank) Date Medicare IM given:   Date Additional Medicare IM given:    Discharge Disposition:    Per UR Regulation:    If discussed at Long Length of Stay Meetings, dates discussed:    Comments:  04/03/13 1500 Sameria Morss, RN, BSN, [employer] [phone] [employer] REP AT [facility], PATIENT IS CURRENTLY IN INITIAL COVERAGE PHASE, DURING THIS PHASE,] XARELTO IS COVERED, PATIENT WILL PAY AT MAX $95, IF PATIENT ENTERS COVERAGE GAP HE WILL PAY$141, ONCE IN CATASTROPHIC STAGE HE WILL PAY$15 LOVENOX IS COVERED WITH A 21 DAY MAX PER YEAR, IF MORE IS NEEDED AUTH IS REQUIRED. IN INITIAL COVERAGE HE WILL PAY AT MAX $95, IN COVERAGE GAP $950, CATASTROPHIC $66 IF PATIENT GOES TO [facility], [facility], OR [facility] PATIENT WILL NOT PAY QUITE AS MUCH AS THEY ARE PREFERRED PHARMACIES  04/02/13 1500  Oletta Cohn, RN, BSN, [place] [phone]  Contact Information   Name                                      Relation Home Work Mobile   [place]                            Spouse [phone]   Danielle Rankin                    Daughter [phone]

## 2013-04-03 NOTE — Progress Notes (Signed)
[facility]  Subjective: Breathing much better. Denies chest pain. Still has significant LEE.  Objective: Vital signs in last 24 hours: Temp:  [97.4 F (36.3 C)-97.8 F (36.6 C)] 97.4 F (36.3 C) (09/09 1610) Pulse Rate:  [60-81] 62 (09/09 0608) Resp:  [16-20] 16 (09/09 0608) BP: (103-137)/(48-86) 118/69 mmHg (09/09 0608) SpO2:  [96 %-100 %] 99 % (09/09 0608) Weight:  [206 lb 6.4 oz (93.622 kg)-226 lb 9.6 oz (102.785 kg)] 206 lb 6.4 oz (93.622 kg) (09/09 0608) Last BM Date: 04/02/13  Intake/Output from previous day: 09/08 0701 - 09/09 0700 In: 720 [P.O.:720] Out: 2015 [Urine:2015] Intake/Output this shift: Total I/O In: -  Out: 300 [Urine:300]  Medications Current Facility-Administered Medications  Medication Dose Route Frequency Provider Last Rate Last Dose  . aspirin EC tablet 81 mg  81 mg Oral Daily Uvaldo Rising, MD   81 mg at 04/02/13 1005  . benazepril (LOTENSIN) tablet 10 mg  10 mg Oral Daily Renee A Kuneff, DO   10 mg at 04/02/13 1005  . chlorhexidine (PERIDEX) 0.12 % solution 15 mL  15 mL Mouth/Throat BID Renee A Kuneff, DO   15 mL at 04/02/13 1010  . diclofenac (VOLTAREN) EC tablet 75 mg  75 mg Oral BID PRN Renee A Kuneff, DO      . diclofenac sodium (VOLTAREN) 1 % transdermal gel 4 g  4 g Topical QID Renee A Kuneff, DO      . doxazosin (CARDURA) tablet 4 mg  4 mg Oral BID Renee A Kuneff, DO   4 mg at 04/02/13 2154  . enoxaparin (LOVENOX) injection 95 mg  1 mg/kg Subcutaneous Q12H Herby Abraham, RPH   95 mg at 04/03/13 0535  . feeding supplement (ENSURE COMPLETE) liquid 237 mL  237 mL Oral Daily Uvaldo Rising, MD   237 mL at 04/02/13 1016  . furosemide (LASIX) injection 40 mg  40 mg Intravenous Once Wenda Low, MD      . hydrochlorothiazide (HYDRODIURIL) tablet 25 mg  25 mg Oral Daily Renee A Kuneff, DO   25 mg at 04/02/13 1007  . isosorbide mononitrate (IMDUR) 24 hr tablet 60 mg  60 mg Oral Daily Renee A Kuneff, DO   60 mg at  04/02/13 1010  . loratadine (CLARITIN) tablet 10 mg  10 mg Oral Daily Renee A Kuneff, DO   10 mg at 04/02/13 1006  . metoprolol succinate (TOPROL-XL) 24 hr tablet 50 mg  50 mg Oral Daily Renee A Kuneff, DO   50 mg at 04/02/13 1007  . multivitamin with minerals tablet 1 tablet  1 tablet Oral Daily Uvaldo Rising, MD   1 tablet at 04/02/13 1005  . simvastatin (ZOCOR) tablet 20 mg  20 mg Oral q1800 Renee A Kuneff, DO   20 mg at 04/02/13 1705  . sodium chloride (OCEAN) 0.65 % nasal spray 1 spray  1 spray Each Nare PRN Renee A Kuneff, DO   1 spray at 04/02/13 1705  . sodium chloride 0.9 % injection 10-40 mL  10-40 mL Intracatheter PRN Uvaldo Rising, MD   10 mL at 04/03/13 0630  . warfarin (COUMADIN) tablet 7.5 mg  7.5 mg Oral ONCE-1800 Uvaldo Rising, MD      . warfarin (COUMADIN) video   Does not apply Once Herby Abraham, [facility]      . Warfarin - Pharmacist Dosing Inpatient   Does not apply q1800 Herby Abraham, [facility]  PE: General appearance: alert, cooperative and no distress Lungs: clear to auscultation bilaterally Heart: 1/6 Murmur, irregular heart rate, regular rhythm  Extremities: 2+ bilateral LEE Pulses: 2+ and symmetric Skin: warm and dry' Neurologic: Grossly normal  Lab Results:   Recent Labs  04/01/13 1350  WBC 6.3  HGB 11.6*  HCT 35.5*  PLT 149*   BMET  Recent Labs  04/01/13 1350  NA 139  K 4.3  CL 104  CO2 26  GLUCOSE 108*  BUN 15  CREATININE 0.90  CALCIUM 9.6   PT/INR  Recent Labs  04/02/13 0500 04/03/13 0500  LABPROT 15.4* 17.0*  INR 1.25 1.42   Filed Weights   04/02/13 0548 04/03/13 0603 04/03/13 4098  Weight: 206 lb 6.4 oz (93.622 kg) 226 lb 9.6 oz (102.785 kg) 206 lb 6.4 oz (93.622 kg)   BNP (last 3 results)  Recent Labs  04/01/13 1350 04/02/13 0500  PROBNP 3356.0* 3608.0*     Assessment/Plan  Active Problems:   DIABETES MELLITUS, CONTROLLED, WITHOUT COMPLICATIONS   HYPERLIPIDEMIA   HYPERTENSION, BENIGN SYSTEMIC   HYPERTENSION    Ischemic cardiomyopathy, EF 35-40% by echo March 2014   CAD, Ant MI 1990- total LAD with collaterals, Myoview low risk 3/12   Colon cancer metastasized to liver   Myocardial infarction'1995   Protein-calorie malnutrition, severe   Acute pulmonary embolism- 04/02/13   Acute on chronic combined systolic and diastolic congestive heart failure   Diastolic dysfunction- grade 24 September 2012  Plan:  1. CHF - He continues to diurese well. Net output since admission is 5.3L. BNP on admission was 3,356. Recommend rechecking BNP today to help guide diuresis. No BMP since time of admission. Order is written as a standing order for every 3rd day. Will need to assess electrolyte status and renal function, in the setting of diuresis. Recommend repeating potassium as needed. HR and BP both stable.    2. PE- INR is subtherapeutic. Continue anticoagulation with Lovenox/Coumadin.   MD to follow with further recommendations.    LOS: 2 days    Brittainy M. Delmer Islam 04/03/2013 9:17 AM  Agree with note written by Boyce Medici  PAC  Diuresing. I/O neg. Still has a significant amount of lower extremity edema and would benefit from a few more days of iv diuretics before transitioning back to PO as an OP. Would re check BNP. I favor SQ Lovenox for systemic anticoagulation in a CA pt with VTE. If he has recurrent epistaxis on this could consider IVC filter. Will follow with you,   Runell Gess 04/03/2013 4:20 PM

## 2013-04-03 NOTE — Progress Notes (Signed)
Family Medicine Teaching Service Daily Progress Note Intern Pager: [phone]  Patient name: Todd [name] Medical record number: [id number] Date of birth: 1929/10/29 Age: 79 y.o. Gender: male  Primary Care Provider: [name], MD Consultants: None Code Status: Full  Pt Overview and Major Events to Date:   9/7: CT Angio: PE in LLL w/ b/l pleural effusions; BNP 3350 9/8: SE Cards: Discussing Anticoag options  Assessment and Plan: Todd [name] is a 79 y.o. male presenting with ~ 1 week history of increasing fatigue and recent dyspnea . PMH is significant for Colon cancer, cardiomyopathy, HTN, CHF and gout. Undergoing chemotherapy. He was recently on a chemo break d/t "blood levels" and tolerance. His last chemo treatment was July 3rd, they started in March of 2014.  CHF: Patient with a BNP 3350 on admission (no prior to compare) [name] to 3608 (9/8). Lasix 60 IV given at [name] and patient reported great relief. Last ECHO 09/2012 Ef 35-40%. Systolic function was moderately reduced with severe hypokinesis of the mid-distal anteroseptal, anterior, inferoseptal and apical myocardium. Grade 2 diastolic dysfunction. Elevated left atrial filling pressure. Mild AS, AR and TR. Moderate to severe Todd. LA mildly dilated. RV pressure increased. PA peak pressure: 51mm Hg (S).  - Strict I/O; daily weights. Dry weight ~200-205. Considering recent chemotherapy he has not had an appetite and weights have varied considerably.   - Wt 9/9: 206.4; Good UOP (0.9 cc/kg/hr) - Fluid restriction to 1.5 liter water  - Lasix 40 mg IV this am 9/9 - Recheck BMP tomorrow 9/10 - Cards (SE): consulted  # Possible Pneumonia: Unlikely Patient had a CXR at [name] concerning for possible PNA (see below). Patient's last chemo treatment (in hospital was July 3rd). He did not present with fever or elevated WBC, although he likely would not from his chemo treatments. Considering his history and immunosuppression he was started on vanc/zosyn  (9/7) for HCAP at the [name] ED. - D/c'd Vanc/zosyn 9/8 - Will continue to monitor for signs of infection - CBC tomorrow 9/10  #PE:   - A D-dimer was performed at [facility] and resulted with 1.95; CTA (+) for PE;  Patient has not been tachycardic and has maintained good oxygenation on RA. VSS.  - Continue to monitor O2 levels; BP stable  - INR 1.42 9/9 Subtheraputic  - Continue: Lovenox   - Discussing Warfarin, IVC with Pt, SE Cards, and Oncology  - Todd [name] recs: Lovenox 1st choice, then Warfarin: Ok w/ IVC filter if pt against Shots or having to follow INR   - pager: [phone]  - Will discuss pros and cons with pt today: debating Lovenox vs Xarelto   - Pharmacy consulted: Lovenox qd dosing? & discuss Xarelto with pt  - Case management consulted: Cost / Approval for Lovenox vs Xarelto  Hypertension: Patient has had stable vitals.  - Continue current home medications ASA, benazepril, cardura (BPH), HCTZ, metoprolol, Zocor   Diabetes:History of diabetes, without current medications. BG have been stable this admission.  - Will continue to monitor   Epistaxis: Since the start of chemo patient has had frequent epistaxis. He reports daily use of saline nasal drops has helped reduce the amount of nose bleeds.  - will continue to monitor for bleeding  - Saline nasal drops ordered for comfort & humidified nasal O2  FEN/GI:  Heart healthy diet.  Fluid restriciton  Prophylaxis: Lovenox 40 mg   Disposition: discharge home with wife, pending clinical improvement  Subjective: Todd [name] reports no SOB this morning  and; minimal LE edema.  He is in good spirits and awaiting anticoag recommendations from [name] and coag therapy discussion today.  Objective: Temp:  [97.4 F (36.3 C)-97.8 F (36.6 C)] 97.4 F (36.3 C) (09/09 1914) Pulse Rate:  [60-81] 62 (09/09 0608) Resp:  [16-20] 16 (09/09 0608) BP: (103-137)/(48-86) 118/69 mmHg (09/09 0608) SpO2:  [96 %-100 %] 99 % (09/09 0608) Weight:  [206 lb 6.4  oz (93.622 kg)-226 lb 9.6 oz (102.785 kg)] 206 lb 6.4 oz (93.622 kg) (09/09 7829)  Physical Exam:   General: NAD. NAD. Resting in bed. Pleasant gentlemen.  HEENT: AT.[place]. MMM. Bilateral nares with mild irritation, no blood noted, no drainage.  Cardiovascular: RRR. 2/6 SM  Respiratory: CTAB; No rhonchi. Good air movement.  Abdomen: Soft. NTND. Colostomy bag in place, without leakage, erythema or signs of skin breakdown.  Extremities: Improving Bilateral +1 pitting edema LE. Pulses are equal. Brisk cap refill. No erythema or tenderness noted.  Skin: No rashes or skin breakdown noted.  Neuro: Alert. Oriented. PERLA. EOMI. CN grossly intact. No focal deficits.   Laboratory:  Recent Labs Lab 03/28/13 0816 04/01/13 1350  WBC 6.5 6.3  HGB 12.2* 11.6*  HCT 36.3* 35.5*  PLT 150 149*    Recent Labs Lab 03/28/13 0816 04/01/13 1350  NA 144 139  K 4.3 4.3  CL  --  104  CO2 22 26  BUN 20.3 15  CREATININE 1.0 0.90  CALCIUM 9.2 9.6  PROT 6.6 6.7  BILITOT 1.10 0.9  ALKPHOS 44 47  ALT 15 16  AST 21 25  GLUCOSE 139 108*    Recent Labs Lab 04/01/13 1350 04/02/13 0500  PROBNP 3356.0* 3608.0*    Recent Labs Lab 04/02/13 0500 04/03/13 0500  INR 1.25 1.42   Imaging/Diagnostic Tests: Dg Chest 2 View  04/01/2013 *RADIOLOGY REPORT* Clinical Data: Shortness of breath. CHEST - 2 VIEW Comparison: Chest x-ray 03/09/2013. Findings: Right internal jugular single lumen power Port-A-Cath with tip terminating in the distal superior vena cava. Lung volumes are low, and there are slightly increasing bibasilar opacities which may simply reflect subsegmental atelectasis or scarring, however, sequelae of aspiration or early bronchopneumonia is difficult to exclude. Small bilateral pleural effusions. No evidence of pulmonary edema. Heart size is within normal limits. Mediastinal contours are unremarkable. Atherosclerosis of the thoracic aorta. IMPRESSION: 1. Persistent small bilateral pleural  effusions. 2. Bibasilar opacities slightly increased, concerning for sequela of recent aspiration or developing bronchopneumonia, although there is likely some component of underlying bibasilar atelectasis/scarring.    CT ANGIOGRAPHY CHEST Positive study for pulmonary emboli and peripheral left lower lobe  pulmonary arteries. Bilateral pleural effusions and basilar  atelectasis are increasing since previous study. Diffuse ground  glass parenchymal changes likely represent edema. Passive  congestion in the liver. 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  Wenda Low, MD 04/03/2013, 8:44 AM PGY-1,  Family Medicine FPTS Intern pager: [phone], text pages welcome

## 2013-04-03 NOTE — Progress Notes (Signed)
ANTICOAGULATION CONSULT NOTE  Pharmacy Consult for LMWH and Coumadin Indication: pulmonary embolus  No Known Allergies  Labs:  Recent Labs  04/01/13 1350 04/02/13 0500 04/03/13 0500  HGB 11.6*  --   --   HCT 35.5*  --   --   PLT 149*  --   --   LABPROT  --  15.4* 17.0*  INR  --  1.25 1.42  CREATININE 0.90  --   --   TROPONINI <0.30  --   --     Estimated Creatinine Clearance: 73.4 ml/min (by C-G formula based on Cr of 0.9).   Assessment: Mr. Schaller is an 79 yo man with + PE by CT angio.  His wt is 95 kg. His creat cl is > 30 ml/min.   He will start LMWH and coumadin to treat PE.   Goal of Therapy:  INR 2-3 Anti-Xa level 0.6-1.2 units/ml 4hrs after LMWH dose given Monitor platelets by anticoagulation protocol: Yes   Plan:  1) Lovenox 95 mg sq Q 12 hours 2) Coumadin 7.5 mg po x 1 3) Daily INR  Thank you. Okey Regal, PharmD [phone] 04/03/2013 8:53 AM

## 2013-04-03 NOTE — Progress Notes (Signed)
WRONG PT VITALS

## 2013-04-03 NOTE — Discharge Summary (Signed)
Family Medicine Teaching [facility] Discharge Summary  Patient name: Todd [name] Medical record number: [id number] Date of birth: May 27, 1930 Age: 79 y.o. Gender: male Date of Admission: 04/01/2013  Date of Discharge: 04/05/13 Admitting Physician: Uvaldo Rising, MD  Primary Care Provider: [name], MD Consultants: Cardiology, Oncology [facility])  Indication for Hospitalization: PE and CHF exacerbation   Discharge Diagnoses/Problem List:  CHF: Patient with a BNP 3350 on admission (no prior to compare) Inc to 3608 (9/8). Lasix 60 IV given at [facility] and patient reported great relief. Last ECHO 09/2012 Ef 35-40%. Systolic function was moderately reduced with severe hypokinesis of the mid-distal anteroseptal, anterior, inferoseptal and apical myocardium. Grade 2 diastolic dysfunction. Elevated left atrial filling pressure. Mild AS, AR and TR. Moderate to severe Todd. LA mildly dilated. RV pressure increased. PA peak pressure: 51mm Hg (S). Dry weight ~200-205. Considering recent chemotherapy he has not had an appetite and weights have varied considerably. Lasix 40 mg IV daily until discharge, then switched back to Torsemide 20 mg qd and advised to follow-up with PCP Dr. Mauricio Po on 9/16 to adjust as needed. Last proBNP was decreasing (1696).    Possible Pneumonia: Unlikely  Patient had a CXR at [facility] concerning for possible PNA (see below). Patient's last chemo treatment (in hospital was July 3rd). He did not present with fever or elevated WBC, although he likely would not from his chemo treatments. Considering his history and immunosuppression he was started on vanc/zosyn (9/7) for HCAP at the [facility] ED. Vanc/zosyn discontinued on 9/8 after PE discovered. CBC 9/10: WBC wnl; mild anemia stable since admit (11.3/ 33.6)   PE:  - A D-dimer was performed at [facility] and resulted with 1.95; CTA (+) for PE. Patient had not been tachycardic and had maintained good oxygenation on RA. VSS. Discussed anticoagulation with pt,  cardiology and oncology. Lovenox reccommended by all, but Todd [name] chose Xarelto Started 9/11 (Case management: approved by insurance). Pt aware Xarelto has not be study in cancer pts, and current recommendations are for Lovenox.   Hypertension: Patient has had stable vitals. Continued current home medications ASA, benazepril, cardura (BPH), HCTZ, metoprolol, Zocor   Diabetes:History of diabetes, without current medications. BG have been stable this admission.    Epistaxis: Since the start of chemo patient has had frequent epistaxis. He reports daily use of saline nasal drops has helped reduce the amount of nose bleeds. Saline nasal drops ordered for comfort & humidified nasal O2. Discussed increased risk of bleeding on Xarelto and need to monitor and seek help for concerning signs/symptoms of active bleeding.   Disposition: Home  Discharge Condition: Stable  Discharge Exam:   Lungs: CTAB, normal resp effort on room air CV: RRR, no m/r/g, CR < 3 secs LE: +1 pitting edema to ankles b/l  Brief Hospital Course: Todd [name] is a 79 y.o. Male who presented with ~ 1 week history of increasing fatigue and recent dyspnea . PMH is significant for Colon cancer, cardiomyopathy, HTN, CHF and gout. Undergoing chemotherapy. He was recently on a chemo break d/t "blood levels" and tolerance. His last chemo treatment was July 3rd, they started in March of 2014. CTA reveal PE, and BNP was elevated 3350 (admission) increased to 3608 the next day. He received IV Lasix 40 mg qd for diuresis, and was discharged on his home Torsemide 20 mg QD with instructions to follow up with PCP for adjustment as needed. Anticoagulation was discussed with Todd [name], his cardiologist and oncologist. Lovenox was recommend by all as  first line therapy, but Todd [name] chose Xarelto. His started Xarelto on 04/05/13, and was discharge with improvement of his SOB, edema, and BNP (1696).   Issues for Follow Up:  1. Adjust diuretics as  needed 2. Confirm Xarelto switch from 15 mg BID to 20 mg QD after 21 days on 04/25/13 1. Pt is aware Xarelto has not been studied in Cancer pts, and Onc, Cards, and FM were recommending Lovenox as first line treatment.   Significant Procedures: None  Significant Labs and Imaging:   Recent Labs Lab 04/01/13 1350 04/04/13 0538  WBC 6.3 6.1  HGB 11.6* 11.3*  HCT 35.5* 33.6*  PLT 149* 143*    Recent Labs Lab 04/01/13 1350 04/04/13 0538  NA 139 139  K 4.3 3.6  CL 104 103  CO2 26 27  GLUCOSE 108* 98  BUN 15 17  CREATININE 0.90 0.94  CALCIUM 9.6 9.1  ALKPHOS 47  --   AST 25  --   ALT 16  --   ALBUMIN 3.5  --     Recent Labs Lab 04/01/13 1350 04/02/13 0500 04/04/13 0538  PROBNP 3356.0* 3608.0* 1696.0*   Results/Tests Pending at Time of Discharge: None  Discharge Medications:    Medication List    STOP taking these medications       amoxicillin 500 MG tablet  Commonly known as:  AMOXIL     colchicine 0.6 MG tablet     loratadine 10 MG tablet  Commonly known as:  CLARITIN      TAKE these medications       aspirin 81 MG tablet  Take 81 mg by mouth daily.     benazepril 10 MG tablet  Commonly known as:  LOTENSIN  Take 1 tablet (10 mg total) by mouth daily.     chlorhexidine 0.12 % solution  Commonly known as:  PERIDEX  Use as directed 15 mLs in the mouth or throat 2 (two) times daily. Use as directed for mouth irritation     diclofenac 75 MG EC tablet  Commonly known as:  VOLTAREN  Take 1 tablet (75 mg total) by mouth 2 (two) times daily as needed.     diclofenac sodium 1 % Gel  Commonly known as:  VOLTAREN  Apply 4 g topically 4 (four) times daily.     diphenoxylate-atropine 2.5-0.025 MG per tablet  Commonly known as:  LOMOTIL  Take 2 tablets by mouth 4 (four) times daily as needed for diarrhea or loose stools.     doxazosin 8 MG tablet  Commonly known as:  CARDURA  Take 4 mg by mouth 2 (two) times daily.     ENSURE PO  Take 1 Can by mouth  daily.     gabapentin 300 MG capsule  Commonly known as:  NEURONTIN  Take 300 mg by mouth as needed.     hydrochlorothiazide 25 MG tablet  Commonly known as:  HYDRODIURIL  TAKE 1 TABLET BY MOUTH EVERY DAY     isosorbide mononitrate 60 MG 24 hr tablet  Commonly known as:  IMDUR  TAKE 1 TABLET BY MOUTH EVERY DAY     lidocaine-prilocaine cream  Commonly known as:  EMLA  Apply topically as needed. Prior to chemotherapy treatment, Apply approx 1/2 teaspoon to skin over port, do not rub in. Cover with plastic,     metoprolol succinate 50 MG 24 hr tablet  Commonly known as:  TOPROL-XL  Take 50 mg by mouth daily. Take with or immediately  following a meal.     multivitamin tablet  Take 1 tablet by mouth daily.     nitroGLYCERIN 0.4 MG SL tablet  Commonly known as:  NITROSTAT  Place 0.4 mg under the tongue every 5 (five) minutes as needed.     prochlorperazine 10 MG tablet  Commonly known as:  COMPAZINE  Take 1 tablet (10 mg total) by mouth every 6 (six) hours as needed.     Rivaroxaban 15 MG Tabs tablet  Commonly known as:  XARELTO  Take 1 tablet (15 mg total) by mouth 2 (two) times daily with a meal.     Rivaroxaban 20 MG Tabs tablet  Commonly known as:  XARELTO  Take 1 tablet (20 mg total) by mouth daily.  Start taking on:  04/27/2013     simvastatin 40 MG tablet  Commonly known as:  ZOCOR  Take 20 mg by mouth every morning.     sodium chloride 0.65 % Soln nasal spray  Commonly known as:  OCEAN  Place 1 spray into the nose as needed for congestion.     SYSTANE OP  Place 1 drop into both eyes daily.     torsemide 20 MG tablet  Commonly known as:  DEMADEX  Take 20 mg by mouth every evening.     VITAMIN E PO  Take 1 capsule by mouth at bedtime.        Discharge Instructions: Please refer to Patient Instructions section of EMR for full details.  Patient was counseled important signs and symptoms that should prompt return to medical care, changes in medications,  dietary instructions, activity restrictions, and follow up appointments.   Follow-Up Appointments:   Follow-up Information   Follow up with [name], MD On 04/10/2013. (Apt at 11:00 am)    Specialty:  Family Medicine   Contact information:   [address] [place] [address] [phone]       Follow up with Leone Brand, NP On 04/16/2013. (at 2:20 pm)    Specialty:  Cardiology   Contact information:   [address] [place] [address] [phone]      Wenda Low, MD 04/03/2013, 12:22 PM PGY-1, [facility] Family Medicine

## 2013-04-03 NOTE — Progress Notes (Signed)
The patient did not have any complaints this morning and did not have any acute changes overnight.

## 2013-04-04 ENCOUNTER — Other Ambulatory Visit: Payer: Medicare Other | Admitting: Lab

## 2013-04-04 ENCOUNTER — Inpatient Hospital Stay: Payer: Medicare Other

## 2013-04-04 LAB — PRO B NATRIURETIC PEPTIDE: Pro B Natriuretic peptide (BNP): 1696 pg/mL — ABNORMAL HIGH (ref 0–450)

## 2013-04-04 LAB — BASIC METABOLIC PANEL
CO2: 27 mEq/L (ref 19–32)
Calcium: 9.1 mg/dL (ref 8.4–10.5)
Creatinine, Ser: 0.94 mg/dL (ref 0.50–1.35)
GFR calc Af Amer: 88 mL/min — ABNORMAL LOW (ref >90)

## 2013-04-04 LAB — CBC
MCH: 30.5 pg (ref 26.0–34.0)
MCV: 90.8 fL (ref 78.0–100.0)
Platelets: 143 10*3/uL — ABNORMAL LOW (ref 150–400)
RDW: 14.8 % (ref 11.5–15.5)

## 2013-04-04 MED ORDER — RIVAROXABAN 20 MG PO TABS: 20.0000 mg | ORAL_TABLET | Freq: Every day | ORAL | Status: DC

## 2013-04-04 MED ORDER — FUROSEMIDE 10 MG/ML IJ SOLN
40.0000 mg | Freq: Once | INTRAMUSCULAR | Status: AC
  Administered 2013-04-04: 40 mg via INTRAVENOUS

## 2013-04-04 MED ORDER — RIVAROXABAN 15 MG PO TABS
15.0000 mg | ORAL_TABLET | Freq: Two times a day (BID) | ORAL | Status: DC
  Administered 2013-04-05: 07:00:00 15 mg via ORAL
  Filled 2013-04-04 (×3): qty 1

## 2013-04-04 NOTE — Progress Notes (Signed)
Family Medicine Teaching Service Daily Progress Note Intern Pager: [phone]  Patient name: Todd [name] Medical record number: [id number] Date of birth: 1929-11-20 Age: 79 y.o. Gender: male  Primary Care Provider: [name], MD Consultants: None Code Status: Full  Pt Overview and Major Events to Date:   9/7: CT Angio: PE in LLL w/ b/l pleural effusions; BNP 3350 9/8: SE Cards: Onc consults 9/10: Will start Xarelto and d/c today; Onc ([place]) will cal pt to schedule follow -up  Assessment and Plan: Todd [name] is a 79 y.o. male presenting with ~ 1 week history of increasing fatigue and recent dyspnea . PMH is significant for Colon cancer, cardiomyopathy, HTN, CHF and gout. Undergoing chemotherapy. He was recently on a chemo break d/t "blood levels" and tolerance. His last chemo treatment was July 3rd, they started in March of 2014.  CHF: Patient with a BNP 3350 on admission (no prior to compare) Inc to 3608 (9/8). Lasix 60 IV given at [facility] and patient reported great relief. Last ECHO 09/2012 Ef 35-40%. Systolic function was moderately reduced with severe hypokinesis of the mid-distal anteroseptal, anterior, inferoseptal and apical myocardium. Grade 2 diastolic dysfunction. Elevated left atrial filling pressure. Mild AS, AR and TR. Moderate to severe Todd. LA mildly dilated. RV pressure increased. PA peak pressure: 51mm Hg (S).  - Strict I/O; daily weights. Dry weight ~200-205. Considering recent chemotherapy he has not had an appetite and weights have varied considerably.   - Wt 9/9: 206.13 down 2 lb from Admit; Good UOP (1.4 cc/kg/hr) - Fluid restriction to 1.5 liter water  - Lasix 40 mg IV this am 9/10  - Will switch back to PO diuretic at d/c today - BMP 9/10: wnl - proBNP 1696 - Cards (SE): consulted  # Possible Pneumonia: Unlikely Patient had a CXR at [facility] concerning for possible PNA (see below). Patient's last chemo treatment (in hospital was July 3rd). He did not present with fever  or elevated WBC, although he likely would not from his chemo treatments. Considering his history and immunosuppression he was started on vanc/zosyn (9/7) for HCAP at the [facility] ED. - D/c'd Vanc/zosyn 9/8 - Will continue to monitor for signs of infection - CBC 9/10: WBC wnl; mild anemia stable since admit ( 11.3/ 33.6)  #PE:   - A D-dimer was performed at [facility] and resulted with 1.95; CTA (+) for PE;  Patient has not been tachycardic and has maintained good oxygenation on RA. VSS.  - Continue to monitor O2 levels; BP stable  - INR 1.26 9/10 Subtheraputic  - Lovenox d/c today: Switching to Xarelto (Case management: approved by insurance)  -Pt aware Xarelto has not be study in cancer pts, and current recommendations are for Lovenox   - Dr Venda Rodes recs: Lovenox 1st choice, then Warfarin: Ok w/ IVC filter if pt against Shots or having to follow INR   - pager: [phone]   Hypertension: Patient has had stable vitals.  - Continue current home medications ASA, benazepril, cardura (BPH), HCTZ, metoprolol, Zocor   Diabetes:History of diabetes, without current medications. BG have been stable this admission.  - Will continue to monitor   Epistaxis: Since the start of chemo patient has had frequent epistaxis. He reports daily use of saline nasal drops has helped reduce the amount of nose bleeds.  - will continue to monitor for bleeding  - Saline nasal drops ordered for comfort & humidified nasal O2  FEN/GI:  Heart healthy diet.  Fluid restriciton  Prophylaxis: Lovenox 40 mg  Disposition: discharge home with wife, pending clinical improvement  Subjective: Todd [name] reports no SOB this morning and; minimal LE edema.  We discussed pros and cons of Lovenox and Xarelto, and pt chooses to go with Xarelto.  Objective: Temp:  [97.5 F (36.4 C)-98.1 F (36.7 C)] 98.1 F (36.7 C) (09/10 0541) Pulse Rate:  [62-79] 79 (09/10 0541) Resp:  [18-20] 19 (09/10 0541) BP: (109-121)/(65-78) 120/71 mmHg (09/10  0541) SpO2:  [97 %-100 %] 97 % (09/10 0730) Weight:  [206 lb 2.1 oz (93.5 kg)] 206 lb 2.1 oz (93.5 kg) (09/10 0541)  Physical Exam:   General: NAD. NAD. Resting in bed. Pleasant gentlemen.  HEENT: AT.[place]. MMM. Bilateral nares with mild irritation, no blood noted, no drainage.  Cardiovascular: RRR. 2/6 SM  Respiratory: CTAB; No rhonchi. Good air movement.  Abdomen: Soft. NTND. Colostomy bag in place, without leakage, erythema or signs of skin breakdown.  Extremities: Improving Bilateral +1 pitting edema LE. Pulses are equal. Brisk cap refill. No erythema or tenderness noted.  Skin: No rashes or skin breakdown noted.  Neuro: Alert. Oriented. PERLA. EOMI. CN grossly intact. No focal deficits.   Laboratory:  Recent Labs Lab 04/01/13 1350 04/04/13 0538  WBC 6.3 6.1  HGB 11.6* 11.3*  HCT 35.5* 33.6*  PLT 149* 143*    Recent Labs Lab 04/01/13 1350 04/04/13 0538  NA 139 139  K 4.3 3.6  CL 104 103  CO2 26 27  BUN 15 17  CREATININE 0.90 0.94  CALCIUM 9.6 9.1  PROT 6.7  --   BILITOT 0.9  --   ALKPHOS 47  --   ALT 16  --   AST 25  --   GLUCOSE 108* 98    Recent Labs Lab 04/01/13 1350 04/02/13 0500 04/04/13 0538  PROBNP 3356.0* 3608.0* 1696.0*    Recent Labs Lab 04/02/13 0500 04/03/13 0500 04/04/13 0538  INR 1.25 1.42 1.26   Imaging/Diagnostic Tests: Dg Chest 2 View  04/01/2013 *RADIOLOGY REPORT* Clinical Data: Shortness of breath. CHEST - 2 VIEW Comparison: Chest x-ray 03/09/2013. Findings: Right internal jugular single lumen power Port-A-Cath with tip terminating in the distal superior vena cava. Lung volumes are low, and there are slightly increasing bibasilar opacities which may simply reflect subsegmental atelectasis or scarring, however, sequelae of aspiration or early bronchopneumonia is difficult to exclude. Small bilateral pleural effusions. No evidence of pulmonary edema. Heart size is within normal limits. Mediastinal contours are unremarkable.  Atherosclerosis of the thoracic aorta. IMPRESSION: 1. Persistent small bilateral pleural effusions. 2. Bibasilar opacities slightly increased, concerning for sequela of recent aspiration or developing bronchopneumonia, although there is likely some component of underlying bibasilar atelectasis/scarring.    CT ANGIOGRAPHY CHEST Positive study for pulmonary emboli and peripheral left lower lobe  pulmonary arteries. Bilateral pleural effusions and basilar  atelectasis are increasing since previous study. Diffuse ground  glass parenchymal changes likely represent edema. Passive  congestion in the liver. 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  Wenda Low, MD 04/04/2013, 9:01 AM PGY-1, [facility] Family Medicine FPTS Intern pager: [phone], text pages welcome

## 2013-04-04 NOTE — Progress Notes (Signed)
[facility]  Subjective: No complaints.   Objective: Vital signs in last 24 hours: Temp:  [97.6 F (36.4 C)-98.1 F (36.7 C)] 98.1 F (36.7 C) (09/10 0541) Pulse Rate:  [62-79] 79 (09/10 0541) Resp:  [18-19] 19 (09/10 0541) BP: (114-121)/(67-78) 120/71 mmHg (09/10 0541) SpO2:  [97 %-100 %] 97 % (09/10 0730) Weight:  [206 lb 2.1 oz (93.5 kg)] 206 lb 2.1 oz (93.5 kg) (09/10 0541) Last BM Date: 04/04/13 (pt has colostomy )  Intake/Output from previous day: 09/09 0701 - 09/10 0700 In: 480 [P.O.:480] Out: 3075 [Urine:3075] Intake/Output this shift: Total I/O In: 240 [P.O.:240] Out: -   Medications Current Facility-Administered Medications  Medication Dose Route Frequency Provider Last Rate Last Dose  . aspirin EC tablet 81 mg  81 mg Oral Daily Uvaldo Rising, MD   81 mg at 04/03/13 1039  . benazepril (LOTENSIN) tablet 10 mg  10 mg Oral Daily Renee A Kuneff, DO   10 mg at 04/03/13 1039  . chlorhexidine (PERIDEX) 0.12 % solution 15 mL  15 mL Mouth/Throat BID Renee A Kuneff, DO   15 mL at 04/03/13 1040  . diclofenac (VOLTAREN) EC tablet 75 mg  75 mg Oral BID PRN Renee A Kuneff, DO      . diclofenac sodium (VOLTAREN) 1 % transdermal gel 4 g  4 g Topical QID Renee A Kuneff, DO   4 g at 04/03/13 1436  . doxazosin (CARDURA) tablet 4 mg  4 mg Oral BID Renee A Kuneff, DO   4 mg at 04/03/13 2138  . feeding supplement (ENSURE COMPLETE) liquid 237 mL  237 mL Oral Daily Uvaldo Rising, MD   237 mL at 04/03/13 1025  . furosemide (LASIX) injection 40 mg  40 mg Intravenous Once Wenda Low, MD      . hydrochlorothiazide (HYDRODIURIL) tablet 25 mg  25 mg Oral Daily Renee A Kuneff, DO   25 mg at 04/03/13 1039  . isosorbide mononitrate (IMDUR) 24 hr tablet 60 mg  60 mg Oral Daily Renee A Kuneff, DO   60 mg at 04/03/13 1039  . loratadine (CLARITIN) tablet 10 mg  10 mg Oral Daily Renee A Kuneff, DO   10 mg at 04/03/13 1039  . metoprolol succinate (TOPROL-XL) 24 hr tablet  50 mg  50 mg Oral Daily Renee A Kuneff, DO   50 mg at 04/03/13 1039  . multivitamin with minerals tablet 1 tablet  1 tablet Oral Daily Uvaldo Rising, MD   1 tablet at 04/03/13 1039  . [START ON 04/05/2013] Rivaroxaban (XARELTO) tablet 15 mg  15 mg Oral BID WC Uvaldo Rising, MD      . Melene Muller ON 04/27/2013] Rivaroxaban (XARELTO) tablet 20 mg  20 mg Oral Daily Uvaldo Rising, MD      . simvastatin (ZOCOR) tablet 20 mg  20 mg Oral q1800 Renee A Kuneff, DO   20 mg at 04/03/13 1817  . sodium chloride (OCEAN) 0.65 % nasal spray 1 spray  1 spray Each Nare PRN Renee A Kuneff, DO   1 spray at 04/02/13 1705  . sodium chloride 0.9 % injection 10-40 mL  10-40 mL Intracatheter PRN Uvaldo Rising, MD   10 mL at 04/04/13 0538    PE: General appearance: alert, cooperative and no distress Lungs: clear to auscultation bilaterally Heart: irregularly irregular rhythm Extremities: 1+ bilateral pretibial edema Pulses: 2+ and symmetric Skin: warm and dry Neurologic: Grossly normal  Lab Results:  Recent Labs  04/01/13 1350 04/04/13 0538  WBC 6.3 6.1  HGB 11.6* 11.3*  HCT 35.5* 33.6*  PLT 149* 143*   BMET  Recent Labs  04/01/13 1350 04/04/13 0538  NA 139 139  K 4.3 3.6  CL 104 103  CO2 26 27  GLUCOSE 108* 98  BUN 15 17  CREATININE 0.90 0.94  CALCIUM 9.6 9.1   PT/INR  Recent Labs  04/02/13 0500 04/03/13 0500 04/04/13 0538  LABPROT 15.4* 17.0* 15.5*  INR 1.25 1.42 1.26   BNP (last 3 results)  Recent Labs  04/01/13 1350 04/02/13 0500 04/04/13 0538  PROBNP 3356.0* 3608.0* 1696.0*   Filed Weights   04/03/13 0603 04/03/13 0608 04/04/13 0541  Weight: 226 lb 9.6 oz (102.785 kg) 206 lb 6.4 oz (93.622 kg) 206 lb 2.1 oz (93.5 kg)    Assessment/Plan  Active Problems:   DIABETES MELLITUS, CONTROLLED, WITHOUT COMPLICATIONS   HYPERLIPIDEMIA   HYPERTENSION, BENIGN SYSTEMIC   HYPERTENSION   Ischemic cardiomyopathy, EF 35-40% by echo March 2014   CAD, Ant MI 1990- total LAD with  collaterals, Myoview low risk 3/12   Colon cancer metastasized to liver   Myocardial infarction'1995   Protein-calorie malnutrition, severe   Acute pulmonary embolism- 04/02/13   Acute on chronic combined systolic and diastolic congestive heart failure   Diastolic dysfunction- grade 24 September 2012  Plan: He has diuresed well. Net total 7.3 L. BNP is much improved, down to 1,696 compared to 3,608 2 days ago. He still has mild 1+ pretibial LEE. Renal function and electrolytes are WNL. Pt could probably benefit from another day of IV lasix, before transitioning to PO. MD to follow to determine. Xarelto has been started for [facility] for PE.     LOS: 3 days    Brittainy M. Delmer Islam 04/04/2013 10:45 AM`  Patient seen and examined. Agree with assessment and plan. Xarelto started for PE ; PE dosing is 15 mg bid for 21 days then 20 mg daily. I/O -8215 since admission.   BNP improved but still increased, agree with IV lasix today prior to converting to oral therapy tomorrow. Would keep in hospital today and re-asses in am.    Lennette Bihari, MD, [facility] 04/04/2013 12:43 PM

## 2013-04-04 NOTE — Progress Notes (Signed)
Patient was placed on 2L O2 nasal cannula last night, complained of shortness of breath at bedtime. Patient stated he frequently get short of breath at night at home. Patient had no further complaints.

## 2013-04-04 NOTE — Progress Notes (Signed)
04/04/13 1110  Spoke with pt and wife at bedside regarding benefits check for Xarelto.  Pt has brochures with 30 day free card and refill assistance card intact.  Information relayed to pt.  Pt verbalizes importance of filling medication upon discharge.

## 2013-04-05 MED ORDER — TORSEMIDE 20 MG PO TABS
20.0000 mg | ORAL_TABLET | Freq: Every day | ORAL | Status: DC
Start: 2013-04-05 — End: 2013-04-05
  Administered 2013-04-05: 20 mg via ORAL
  Filled 2013-04-05: qty 1

## 2013-04-05 MED ORDER — RIVAROXABAN 20 MG PO TABS: 20.0000 mg | ORAL_TABLET | Freq: Every day | ORAL | Status: DC

## 2013-04-05 MED ORDER — RIVAROXABAN 15 MG PO TABS: 15.0000 mg | ORAL_TABLET | Freq: Two times a day (BID) | ORAL | Status: DC

## 2013-04-05 MED ORDER — HEPARIN SOD (PORK) LOCK FLUSH 100 UNIT/ML IV SOLN
500.0000 [IU] | INTRAVENOUS | Status: AC | PRN
  Administered 2013-04-05: 500 [IU]

## 2013-04-05 MED ORDER — SALINE SPRAY 0.65 % NA SOLN: 1.0000 | NASAL | Status: DC | PRN

## 2013-04-05 NOTE — Progress Notes (Signed)
SATURATION QUALIFICATIONS: (This note is used to comply with regulatory documentation for home oxygen)  Patient Saturations on Room Air at Rest = 96%  Patient Saturations on Room Air while Ambulating = 90%  Patient Saturations on room air of oxygen while Ambulating = 90%  Please briefly explain why patient needs home oxygen: pt ambulated whole hallway, O2 90-93% on room air

## 2013-04-05 NOTE — Progress Notes (Signed)
I discussed with  Dr Joyner.  I agree with their plans documented in their progress note for today.  

## 2013-04-05 NOTE — Progress Notes (Addendum)
Subjective: No dyspnea, mild residual ankle edema  Objective: Vital signs in last 24 hours: Temp:  [97.2 F (36.2 C)-98.7 F (37.1 C)] 97.2 F (36.2 C) (09/11 0500) Pulse Rate:  [61-65] 61 (09/11 0500) Resp:  [18] 18 (09/11 0500) BP: (118-133)/(67-86) 133/67 mmHg (09/11 0500) SpO2:  [97 %-100 %] 97 % (09/11 0500) Weight:  [203 lb 4.8 oz (92.216 kg)] 203 lb 4.8 oz (92.216 kg) (09/11 0500) Weight change: -2 lb 13.3 oz (-1.284 kg) Last BM Date: 04/04/13 Intake/Output from previous day: -2100 (total -9680 since admit)   Wt 203.4 down from Pk of ?208 lbs on admit  09/10 0701 - 09/11 0700 In: 820 [P.O.:820] Out: 2920 [Urine:2920] Intake/Output this shift: Total I/O In: -  Out: 250 [Urine:250]  PE: General:Pleasant affect, NAD Skin:Warm and dry, brisk capillary refill HEENT:normocephalic, sclera clear, mucus membranes moist Heart:S1S2 RRR without murmur, gallup, rub or click Lungs:clear without rales, rhonchi, or wheezes ZOX:WRUE, non tender, + BS, do not palpate liver spleen or masses Ext:tr-1+ edema of feet Neuro:alert and oriented, MAE, follows commands, + facial symmetry   Lab Results:  Recent Labs  04/04/13 0538  WBC 6.1  HGB 11.3*  HCT 33.6*  PLT 143*   BMET  Recent Labs  04/04/13 0538  NA 139  K 3.6  CL 103  CO2 27  GLUCOSE 98  BUN 17  CREATININE 0.94  CALCIUM 9.1   No results found for this basename: TROPONINI, CK, MB,  in the last 72 hours  Lab Results  Component Value Date   CHOL 114 01/15/2013   HDL 48 01/15/2013   LDLCALC 53 01/15/2013   LDLDIRECT 66 12/25/2009   TRIG 65 01/15/2013   CHOLHDL 2.4 01/15/2013   Lab Results  Component Value Date   HGBA1C 5.2 03/20/2013     No results found for this basename: TSH   BNP (last 3 results)  Recent Labs  04/01/13 1350 04/02/13 0500 04/04/13 0538  PROBNP 3356.0* 3608.0* 1696.0*      Studies/Results: No results found.  Medications: I have reviewed the patient's current  medications. Scheduled Meds: . aspirin EC  81 mg Oral Daily  . benazepril  10 mg Oral Daily  . chlorhexidine  15 mL Mouth/Throat BID  . diclofenac sodium  4 g Topical QID  . doxazosin  4 mg Oral BID  . feeding supplement  237 mL Oral Daily  . hydrochlorothiazide  25 mg Oral Daily  . isosorbide mononitrate  60 mg Oral Daily  . loratadine  10 mg Oral Daily  . metoprolol succinate  50 mg Oral Daily  . multivitamin with minerals  1 tablet Oral Daily  . Rivaroxaban  15 mg Oral BID WC  . [START ON 04/27/2013] rivaroxaban  20 mg Oral Daily  . simvastatin  20 mg Oral q1800   Continuous Infusions:  PRN Meds:.diclofenac, sodium chloride, sodium chloride  Assessment/Plan: Principal Problem:   Acute pulmonary embolism- 04/02/13 Active Problems:   Acute on chronic combined systolic and diastolic congestive heart failure   DIABETES MELLITUS, CONTROLLED, WITHOUT COMPLICATIONS   HYPERLIPIDEMIA   HYPERTENSION, BENIGN SYSTEMIC   HYPERTENSION   Ischemic cardiomyopathy, EF 35-40% by echo March 2014   CAD, Ant MI 1990- total LAD with collaterals, Myoview low risk 3/12   Colon cancer metastasized to liver   Myocardial infarction'1995   Protein-calorie malnutrition, severe   Diastolic dysfunction- grade 24 September 2012  Plan:  Pt had rec'd approx 40 mg lasix daily, none  ordered for today-to go back to torsemide.   Now on full dose anticoagulation  EF 35-40% in 09/2012   LOS: 4 days   Time spent with pt. :15 minutes. [facility] R  Nurse Practitioner Certified Pager [phone] 04/05/2013, 8:29 AM  I have seen and examined the patient along with Nada Boozer NP.  I have reviewed the chart, notes and new data.  I agree with NP's note.  Key new complaints: no dyspnea Key examination changes: mild ankle edema, weight 203 lb, down 15 lb Key new findings / data: normal renal function  PLAN: Weigh daily and report gain > 3 lb. Early F/U.  Thurmon Fair, MD, [facility] [phone] 04/05/2013, 9:24 AM

## 2013-04-05 NOTE — Progress Notes (Signed)
NUTRITION FOLLOW-UP  DOCUMENTATION CODES Per approved criteria  -Severe malnutrition in the context of chronic illness -Obesity Unspecified   INTERVENTION: Continue Ensure Complete daily. RD to continue to follow nutrition care plan.  NUTRITION DIAGNOSIS: Inadequate oral intake related to poor appetite and early satiety as evidenced by pt report and severe muscle/fat mass loss. Improving.  Goal: Intake to meet >90% of estimated nutrition needs. Met.  Monitor:  weight trends, lab trends, I/O's, PO intake, supplement tolerance  ASSESSMENT: PMHx significant for colon CA, HTN, CHF, gout. Admitted with dyspnea, fatigue, weakness and cough that has progressed over the past week; also endorses poor appetite. Work-up reveals PE.  Continues on Heart Healthy diet at this time. Eating 100% of meals. Continues on Ensure Complete daily.  Pt meets criteria for severe MALNUTRITION in the context of chronic illness as evidenced by severe muscle mass loss, severe fat mass loss, and intake of <75% x at least 1 month.   Height: Ht Readings from Last 1 Encounters:  04/01/13 5' 9.5" (1.765 m)    Weight: Wt Readings from Last 1 Encounters:  04/05/13 203 lb 4.8 oz (92.216 kg)  Admit wt 218 lb - trending down with net fluid balance of -9/9 liters  BMI:  Body mass index is 29.6 kg/(m^2). Obese Class I  Estimated Nutritional Needs: Kcal: 1950-2200 Protein: 100 - 130 Fluid: approx 2 liters daily  Skin: intact  Diet Order: Cardiac  EDUCATION NEEDS: -No education needs identified at this time   Intake/Output Summary (Last 24 hours) at 04/05/13 1040 Last data filed at 04/05/13 1022  Gross per 24 hour  Intake    820 ml  Output   2945 ml  Net  -2125 ml    Last BM: 9/11 - via colostomy  Labs:   Recent Labs Lab 04/01/13 1350 04/04/13 0538  NA 139 139  K 4.3 3.6  CL 104 103  CO2 26 27  BUN 15 17  CREATININE 0.90 0.94  CALCIUM 9.6 9.1  GLUCOSE 108* 98    CBG (last 3)  No  results found for this basename: GLUCAP,  in the last 72 hours  Scheduled Meds: . aspirin EC  81 mg Oral Daily  . benazepril  10 mg Oral Daily  . chlorhexidine  15 mL Mouth/Throat BID  . diclofenac sodium  4 g Topical QID  . doxazosin  4 mg Oral BID  . feeding supplement  237 mL Oral Daily  . hydrochlorothiazide  25 mg Oral Daily  . isosorbide mononitrate  60 mg Oral Daily  . loratadine  10 mg Oral Daily  . metoprolol succinate  50 mg Oral Daily  . multivitamin with minerals  1 tablet Oral Daily  . Rivaroxaban  15 mg Oral BID WC  . [START ON 04/27/2013] rivaroxaban  20 mg Oral Daily  . simvastatin  20 mg Oral q1800  . torsemide  20 mg Oral Daily    Continuous Infusions:  none  Jarold Motto MS, RD, LDN Pager: [phone] After-hours pager: [phone]

## 2013-04-05 NOTE — Progress Notes (Signed)
Family Medicine Teaching Service Daily Progress Note Intern Pager: [phone]  Patient name: Todd [name] Medical record number: [id number] Date of birth: [birth year] Age: 79 y.o. Gender: male  Primary Care Provider: [name], MD Consultants: None Code Status: Full  Pt Overview and Major Events to Date:   9/7: CT Angio: PE in LLL w/ b/l pleural effusions; BNP 3350 9/8: SE Cards: Onc consults 9/10: Will start Xarelto and d/c Lovenox today; Onc (Shidad) will cal pt to schedule follow -up  Assessment and Plan: [name] is a 79 y.o. male presenting with ~ 1 week history of increasing fatigue and recent dyspnea . PMH is significant for Colon cancer, cardiomyopathy, HTN, CHF and gout. Undergoing chemotherapy. He was recently on a chemo break d/t "blood levels" and tolerance. His last chemo treatment was July 3rd, they started in March of 2014.  CHF: Patient with a BNP 3350 on admission (no prior to compare) Inc to 3608 (9/8). Lasix 60 IV given at [facility] and patient reported great relief. Last ECHO 09/2012 Ef 35-40%. Systolic function was moderately reduced with severe hypokinesis of the mid-distal anteroseptal, anterior, inferoseptal and apical myocardium. Grade 2 diastolic dysfunction. Elevated left atrial filling pressure. Mild AS, AR and TR. Moderate to severe Todd. LA mildly dilated. RV pressure increased. PA peak pressure: 51mm Hg (S).  - Strict I/O; daily weights. Dry weight ~200-205. Considering recent chemotherapy he has not had an appetite and weights have varied considerably.   - Wt 9/10: 203.13 down 5 lb from Admit; Good UOP (1.4 cc/kg/hr) - Fluid restriction to 1.5 liter water  - Lasix 40 mg IV completed 9/10 - Will switch back to PO torsemide 20mg  qd (home dose)  - Follow-up apt with PCP [name] on 9/16 to adjust as needed - BMP 9/10: wnl - proBNP 1696 - Cards (SE): consulted  # Possible Pneumonia: Unlikely Patient had a CXR at [facility] concerning for possible PNA (see below). Patient's  last chemo treatment (in hospital was July 3rd). He did not present with fever or elevated WBC, although he likely would not from his chemo treatments. Considering his history and immunosuppression he was started on vanc/zosyn (9/7) for HCAP at the [facility] ED. - D/c'd Vanc/zosyn 9/8 - Will continue to monitor for signs of infection - CBC 9/10: WBC wnl; mild anemia stable since admit ( 11.3/ 33.6)  #PE:   - A D-dimer was performed at [facility] and resulted with 1.95; CTA (+) for PE;  Patient has not been tachycardic and has maintained good oxygenation on RA. VSS.  - Continue to monitor O2 levels; BP stable  - INR 1.26 9/10 Subtheraputic  - Continuing Xarelto Started 9/10 (Case management: approved by insurance)  - Pt aware Xarelto has not be study in cancer pts, and current recommendations are for Lovenox   - Dr Venda Rodes recs: Lovenox 1st choice, then Warfarin: Ok w/ IVC filter if pt against Shots or having to follow INR   - pager: [phone]   Hypertension: Patient has had stable vitals.  - Continue current home medications ASA, benazepril, cardura (BPH), HCTZ, metoprolol, Zocor   Diabetes:History of diabetes, without current medications. BG have been stable this admission.  - Will continue to monitor   Epistaxis: Since the start of chemo patient has had frequent epistaxis. He reports daily use of saline nasal drops has helped reduce the amount of nose bleeds.  - will continue to monitor for bleeding  - Saline nasal drops ordered for comfort & humidified nasal O2  FEN/GI:  Heart healthy diet.  Fluid restriciton  Prophylaxis: Lovenox 40 mg   Disposition: discharge home with wife, pending clinical improvement  Subjective: Todd [name] reports no SOB this morning and improved LE edema. Tolerated Xarelto well. No complaints  Objective: Temp:  [97.2 F (36.2 C)-98.7 F (37.1 C)] 97.2 F (36.2 C) (09/11 0500) Pulse Rate:  [61-65] 61 (09/11 0500) Resp:  [18] 18 (09/11 0500) BP: (118-133)/(67-86)  133/67 mmHg (09/11 0500) SpO2:  [97 %-100 %] 97 % (09/11 0500) Weight:  [203 lb 4.8 oz (92.216 kg)] 203 lb 4.8 oz (92.216 kg) (09/11 0500)  Physical Exam:   General: NAD. NAD. Resting in bed. Pleasant gentlemen.  HEENT: AT.[place]. MMM. Bilateral nares with mild irritation, no blood noted, no drainage.  Cardiovascular: RRR. 2/6 SM  Respiratory: CTAB; No rhonchi. Good air movement.  Abdomen: Soft. NTND. Colostomy bag in place, without leakage, erythema or signs of skin breakdown.   Laboratory:  Recent Labs Lab 04/01/13 1350 04/04/13 0538  WBC 6.3 6.1  HGB 11.6* 11.3*  HCT 35.5* 33.6*  PLT 149* 143*    Recent Labs Lab 04/01/13 1350 04/04/13 0538  NA 139 139  K 4.3 3.6  CL 104 103  CO2 26 27  BUN 15 17  CREATININE 0.90 0.94  CALCIUM 9.6 9.1  PROT 6.7  --   BILITOT 0.9  --   ALKPHOS 47  --   ALT 16  --   AST 25  --   GLUCOSE 108* 98    Recent Labs Lab 04/01/13 1350 04/02/13 0500 04/04/13 0538  PROBNP 3356.0* 3608.0* 1696.0*    Recent Labs Lab 04/02/13 0500 04/03/13 0500 04/04/13 0538 04/05/13 0525  INR 1.25 1.42 1.26 1.21   Imaging/Diagnostic Tests: Dg Chest 2 View  04/01/2013 *RADIOLOGY REPORT* Clinical Data: Shortness of breath. CHEST - 2 VIEW Comparison: Chest x-ray 03/09/2013. Findings: Right internal jugular single lumen power Port-A-Cath with tip terminating in the distal superior vena cava. Lung volumes are low, and there are slightly increasing bibasilar opacities which may simply reflect subsegmental atelectasis or scarring, however, sequelae of aspiration or early bronchopneumonia is difficult to exclude. Small bilateral pleural effusions. No evidence of pulmonary edema. Heart size is within normal limits. Mediastinal contours are unremarkable. Atherosclerosis of the thoracic aorta. IMPRESSION: 1. Persistent small bilateral pleural effusions. 2. Bibasilar opacities slightly increased, concerning for sequela of recent aspiration or developing  bronchopneumonia, although there is likely some component of underlying bibasilar atelectasis/scarring.    CT ANGIOGRAPHY CHEST Positive study for pulmonary emboli and peripheral left lower lobe  pulmonary arteries. Bilateral pleural effusions and basilar  atelectasis are increasing since previous study. Diffuse ground  glass parenchymal changes likely represent edema. Passive  congestion in the liver. 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  Wenda Low, MD 04/05/2013, 7:21 AM PGY-1, [facility] FPTS Intern pager: [phone], text pages welcome

## 2013-04-05 NOTE — Progress Notes (Signed)
Pt being d/c to home with family, pt given d'c instructions, medications and follow up appoinments reviewed, pt and wife verbalized understanding, pt leaving via wheelchair with wife, pt stable

## 2013-04-09 ENCOUNTER — Telehealth: Payer: Self-pay | Admitting: Oncology

## 2013-04-09 NOTE — Telephone Encounter (Signed)
Per response from FS pt can keep next appt as scheduled foro 9/24. lmonvm informing pt's wife and confirming next appt d/t for 9/24.

## 2013-04-10 ENCOUNTER — Encounter: Payer: Self-pay | Admitting: Family Medicine

## 2013-04-10 ENCOUNTER — Ambulatory Visit (INDEPENDENT_AMBULATORY_CARE_PROVIDER_SITE_OTHER): Payer: Medicare Other | Admitting: Family Medicine

## 2013-04-10 VITALS — BP 129/75 | HR 66 | Ht 69.5 in | Wt 204.7 lb

## 2013-04-10 DIAGNOSIS — C189 Malignant neoplasm of colon, unspecified: Secondary | ICD-10-CM

## 2013-04-10 DIAGNOSIS — I5043 Acute on chronic combined systolic (congestive) and diastolic (congestive) heart failure: Secondary | ICD-10-CM

## 2013-04-10 DIAGNOSIS — I509 Heart failure, unspecified: Secondary | ICD-10-CM

## 2013-04-10 DIAGNOSIS — I2699 Other pulmonary embolism without acute cor pulmonale: Secondary | ICD-10-CM

## 2013-04-10 DIAGNOSIS — C787 Secondary malignant neoplasm of liver and intrahepatic bile duct: Secondary | ICD-10-CM

## 2013-04-10 MED ORDER — TORSEMIDE 20 MG PO TABS: 20.0000 mg | ORAL_TABLET | Freq: Every evening | ORAL | Status: DC

## 2013-04-10 MED ORDER — MUPIROCIN CALCIUM 2 % EX CREA: TOPICAL_CREAM | Freq: Two times a day (BID) | CUTANEOUS | Status: DC

## 2013-04-10 NOTE — Patient Instructions (Addendum)
It was a pleasure to see you today.  I refilled the torsemide 20mg  daily today.  IF YOUR HOME WEIGHT GOES UP BY 4 LBS OVER 2 DAYS, PLEASE TAKE 2 OF THE TORSEMIDE TABLETS/DAY AND GIVE ME A CALL.  I would like to see you back in 4 to 6 weeks for follow up.

## 2013-04-11 NOTE — Progress Notes (Signed)
  Subjective:    Patient ID: Todd [name], male    DOB: 1929/11/06, 79 y.o.   MRN: [id number]  HPI Seen today for hospital follow up; was admitted last week for dyspnea, found to have PE and also fluid overload related to systolic dysfunction.  Was successfully diuresed to optimal dry weight, which we are assuming to be around 199 lbs (on home bathroom scale).  He has been taking the torsemide 20mg  daily since discharge; home weights since then have been (starting Friday, Sept 12:) 196#, 199#, 201#, 199#.  He opted to use Xarelto as his anticoagulation at discharge, and he continues to take as prescribed.  Has some mild blood streaking in nasal discharge but not as bad as the epistaxis he had while on Avastin. Continues to be somewhat dyspneic at home.  Wife remarks that he is more mobile than before the hospitalization, walking more at home.  Goes to grocery store and walks with aid of shopping cart for 20 minutes at a time. Is able to do this.  Denies fevers or chills.  Some cough which he describes as productive of some dark sputum.    Has rescheduled his oncology appointment with Dr Clelia Croft for next week. Plans to restart his chemotherapy.   Review of Systems See ROS above     Objective:   Physical Exam Well appearing, no apparent distress HEENT Neck supple, no cervical adenopathy.  COR Regular S1S2, no extra sounds.  PULM Lung sounds clear, no rales or wheezes.  Good air movement bilaterally.  EXTS: 2+ bilateral lower extremity edema, larger in right than left. Much improved from my prior exam of patient one week ago while hospitalized, and improved from prior outpatient visits in the past 6 weeks.       Assessment & Plan:

## 2013-04-11 NOTE — Assessment & Plan Note (Signed)
Has follow up with his oncologist, Dr Clelia Croft, next week.

## 2013-04-11 NOTE — Assessment & Plan Note (Signed)
Adherent with Xarelto.  Discussed dosing.

## 2013-04-11 NOTE — Assessment & Plan Note (Signed)
Is maintaining his diuresis well; continues on torsemide 20mg  daily.  Discussed self-management of his diuresis, is watching weights on bathroom scale at home.  Has follow up with the Cardiology service as well next week.

## 2013-04-11 NOTE — Progress Notes (Signed)
  Subjective:    Patient ID: Todd [name], male    DOB: 22-Feb-1930, 79 y.o.   MRN: [id number]  HPI  Also of note, has a superficial mark on R upper chest that he noticed as he was discharged from the hospital.  Feels "like a burn".  Applying neosporin to the area, wife says it's marginally smaller than before. Not more painful.  Has not been spreading.  No vesicles observed prior to today's visit.  Patient confirms that he did receive the zoster vaccine.  Review of Systems     Objective:   Physical Exam R upper chest with 1.5 x 2.5cm area of erythema, slight serous exudate over the area.  No fluctuance, no purulence.  No bleeding from site.  No vesicles.  No dermatomal lesions elsewhere on trunk.        Assessment & Plan:

## 2013-04-16 ENCOUNTER — Telehealth: Payer: Self-pay | Admitting: Family Medicine

## 2013-04-16 ENCOUNTER — Encounter: Payer: Self-pay | Admitting: Cardiology

## 2013-04-16 ENCOUNTER — Ambulatory Visit (INDEPENDENT_AMBULATORY_CARE_PROVIDER_SITE_OTHER): Payer: Medicare Other | Admitting: Cardiology

## 2013-04-16 VITALS — BP 110/80 | HR 60 | Ht 69.5 in | Wt 208.2 lb

## 2013-04-16 DIAGNOSIS — I2699 Other pulmonary embolism without acute cor pulmonale: Secondary | ICD-10-CM

## 2013-04-16 DIAGNOSIS — I509 Heart failure, unspecified: Secondary | ICD-10-CM

## 2013-04-16 DIAGNOSIS — C189 Malignant neoplasm of colon, unspecified: Secondary | ICD-10-CM

## 2013-04-16 DIAGNOSIS — R238 Other skin changes: Secondary | ICD-10-CM

## 2013-04-16 DIAGNOSIS — I5043 Acute on chronic combined systolic (congestive) and diastolic (congestive) heart failure: Secondary | ICD-10-CM

## 2013-04-16 DIAGNOSIS — I10 Essential (primary) hypertension: Secondary | ICD-10-CM

## 2013-04-16 DIAGNOSIS — L989 Disorder of the skin and subcutaneous tissue, unspecified: Secondary | ICD-10-CM

## 2013-04-16 DIAGNOSIS — I2510 Atherosclerotic heart disease of native coronary artery without angina pectoris: Secondary | ICD-10-CM

## 2013-04-16 DIAGNOSIS — C787 Secondary malignant neoplasm of liver and intrahepatic bile duct: Secondary | ICD-10-CM

## 2013-04-16 NOTE — Progress Notes (Signed)
04/17/2013   PCP: [name], MD   Chief Complaint  Patient presents with  . Follow-up    post hospital:  Has good days and bad days.    Primary Cardiologist:Dr. Erlene Quan   HPI:  79 year old, mildly overweight, married Caucasian male, father of 2, grandfather to 5 grandchildren without is formerly a patient of Dr. Caprice Kluver. Dr. Allyson Sabal  assumed his care in Dr. Fredirick Maudlin absence. He has a history of ischemic heart disease status post anterior wall myocardial infarction in 1990, at which time he underwent cardiac catheterization revealing a total LAD with collaterals. His EF at that time was 30%. He has had no evidence of congestive heart failure. His other problems include hypertension and hyperlipidemia as well as gout. He was recently diagnosed with colon cancer and underwent resection by Dr. Ovidio Kin with a colostomy. He has undergone chemotherapy administered by Dr. Clelia Croft with reduced dose FOLFIRI with Avastin.   A 2-D echo performed 09/2012 revealed an EF in the 35% range with wall motion abnormality consistent with his LAD occlusion. He had grade 2 diastolic dysfunction.    He was recently hospitalized September 7 through 04/05/2013 for Lt pulmonary emboli and congestive heart failure exacerbation.  His dry weight is considered 200-205 pounds.  During hospitalization he was placed on IV Lasix and at discharge changed back to torsemide. He also underwent anticoagulation. Patient is on Xarelto and would prefer to stay on the medication though he would switch to Coumadin if Dr. Venda Rodes feels that is necessary. He does not want to take Lovenox on routine basis.  Today wt is stable at 208, ( wt at discharge was 203) he has no chest pain, + SOB though overall improved.    He is concerned about resuming chemo.  He is to see Dr. Teressa Lower on Wed. This week for discussion.  They will then decide to proceed or not.  Pt wonders if he is ready for chemo this close to Pul embolus.  Also if  Dr. Teressa Lower wants him on another anticoagulant is it OK with Dr. Allyson Sabal,  Yes to coumadin pt does not want Lovenox.  Pt has also developed reddened areas on chest, at times they drain.  Does not appear to be shingles appears to be reaction to electrodes.  If site increases in size we may need to add antibiotics.    No Known Allergies  Current Outpatient Prescriptions  Medication Sig Dispense Refill  . aspirin 81 MG tablet Take 81 mg by mouth daily.        . benazepril (LOTENSIN) 10 MG tablet Take 1 tablet (10 mg total) by mouth daily.  90 tablet  3  . chlorhexidine (PERIDEX) 0.12 % solution Use as directed 15 mLs in the mouth or throat 2 (two) times daily. Use as directed for mouth irritation      . diclofenac (VOLTAREN) 75 MG EC tablet Take 1 tablet (75 mg total) by mouth 2 (two) times daily as needed.  180 tablet  0  . diclofenac sodium (VOLTAREN) 1 % GEL Apply 4 g topically 4 (four) times daily.  100 g  2  . diphenoxylate-atropine (LOMOTIL) 2.5-0.025 MG per tablet Take 2 tablets by mouth 4 (four) times daily as needed for diarrhea or loose stools.  60 tablet  1  . doxazosin (CARDURA) 8 MG tablet Take 4 mg by mouth 2 (two) times daily.       Marland Kitchen gabapentin (NEURONTIN) 300 MG capsule Take  300 mg by mouth as needed.      . hydrochlorothiazide (HYDRODIURIL) 25 MG tablet TAKE 1 TABLET BY MOUTH EVERY DAY  90 tablet  3  . isosorbide mononitrate (IMDUR) 60 MG 24 hr tablet TAKE 1 TABLET BY MOUTH EVERY DAY  90 tablet  3  . lidocaine-prilocaine (EMLA) cream Apply topically as needed. Prior to chemotherapy treatment, Apply approx 1/2 teaspoon to skin over port, do not rub in. Cover with plastic,  30 g  1  . metoprolol succinate (TOPROL-XL) 50 MG 24 hr tablet Take 50 mg by mouth daily. Take with or immediately following a meal.      . Multiple Vitamin (MULTIVITAMIN) tablet Take 1 tablet by mouth daily.        . mupirocin cream (BACTROBAN) 2 % Apply topically 2 (two) times daily.  30 g  1  . nitroGLYCERIN  (NITROSTAT) 0.4 MG SL tablet Place 0.4 mg under the tongue every 5 (five) minutes as needed.        . Nutritional Supplements (ENSURE PO) Take 1 Can by mouth daily.      Bertram Gala Glycol-Propyl Glycol (SYSTANE OP) Place 1 drop into both eyes daily.       . prochlorperazine (COMPAZINE) 10 MG tablet Take 1 tablet (10 mg total) by mouth every 6 (six) hours as needed.  30 tablet  1  . Rivaroxaban (XARELTO) 15 MG TABS tablet Take 1 tablet (15 mg total) by mouth 2 (two) times daily with a meal.  40 tablet  0  . [START ON 04/27/2013] Rivaroxaban (XARELTO) 20 MG TABS tablet Take 1 tablet (20 mg total) by mouth daily.  30 tablet  0  . simvastatin (ZOCOR) 40 MG tablet Take 20 mg by mouth every morning.      . sodium chloride (OCEAN) 0.65 % SOLN nasal spray Place 1 spray into the nose as needed for congestion.  1 Bottle  0  . torsemide (DEMADEX) 20 MG tablet Take 1 tablet (20 mg total) by mouth every evening.  90 tablet  3  . VITAMIN E PO Take 1 capsule by mouth at bedtime.       No current facility-administered medications for this visit.    Past Medical History  Diagnosis Date  . Myocardial infarction 1990    "Massive"- total LAD with collaterals  . Gout   . Hypertension   . Arthritis, rheumatoid   . Diabetes mellitus without complication   . CHF (congestive heart failure)   . BPH (benign prostatic hyperplasia)   . Neuropathy   . Hyperlipidemia   . Ischemic cardiomyopathy March 2012    EF 31% Myoview. Pt declines ICD  . Colon cancer 2014    Surg, chemo March 2014    Past Surgical History  Procedure Laterality Date  . Cataract extraction    . Flexible sigmoidoscopy N/A 09/05/2012    Procedure: FLEXIBLE SIGMOIDOSCOPY;  Surgeon: Barrie Folk, MD;  Location: [facility] ENDOSCOPY;  Service: Endoscopy;  Laterality: N/A;  Give 1000 cc tapwater enema on call to endoscopy.  . Colon resection N/A 09/07/2012    Procedure: LAPAROSCOPIC ASSISTED SIGMOID COLON RESECTION END OSTOMY;  Surgeon: Kandis Cocking,  MD;  Location: [facility] OR;  Service: General;  Laterality: N/A;  . Portacath placement N/A 09/07/2012    Procedure: Placement of power port;  Surgeon: Kandis Cocking, MD;  Location: [facility] OR;  Service: General;  Laterality: N/A;    ZOX:WRUEAVW:UJ colds or fevers, stable weight  Skin:no rashes or  ulcers- area on Rt chest with reddened area HEENT:no blurred vision, no congestion CV:see HPI PUL:see HPI some streaked sputum with dk blood, now resolved GI:no diarrhea constipation or melena, no indigestion GU:no hematuria, no dysuria MS:no joint pain, no claudication Neuro:no syncope, no lightheadedness Endo:+ diabetes stable, no thyroid disease  PHYSICAL EXAM BP 110/80  Pulse 60  Ht 5' 9.5" (1.765 m)  Wt 208 lb 3.2 oz (94.439 kg)  BMI 30.32 kg/m2 General:Pleasant affect, NAD Skin:Warm and dry, brisk capillary refill HEENT:normocephalic, sclera clear, mucus membranes moist Neck:supple, no JVD, no bruits  Heart:S1S2 RRR without murmur, gallup, rub or click Lungs:clear  But diminished without rales, rhonchi, or wheezes AVW:UJWJ, non tender, + BS, do not palpate liver spleen or masses, colostomy with minimal drainage no redness. Ext:1+ lower ext edema, 2+ pedal pulses, 2+ radial pulses, edema improved from discharge Neuro:alert and oriented, MAE, follows commands, + facial symmetry  EKG:SR old ant septal infarct no changes from 04/01/13  ASSESSMENT AND PLAN Acute pulmonary embolism- 04/02/13 On Xarelto pt is concerned that he may not be able to continue if resumes chemo- may need to change to coumadin.  He will discuss with Dr. Teressa Lower. He does now want Lovenox.  Acute on chronic combined systolic and diastolic congestive heart failure Much improved.  Wt stable, slightly higher than in hospital  HYPERTENSION controlled  CAD, Ant MI 1990- total LAD with collaterals, Myoview low risk 3/12 No chest pain  Colon cancer metastasized to liver May need to resume chemo.  Pt would Like Dr. Hazle Coca  opinion on resuming now- but first pt meets with Dr. Teressa Lower on Wed to discuss.  Skin irritation At site of electrodes.  Continue to use antibiotic ointment, notify us if site increases in size   Pt will follow up with Dr. Allyson Sabal in 3-4 weeks.

## 2013-04-16 NOTE — Telephone Encounter (Signed)
Pt wanted to let you know that the lesion he was given the medication for is not doing well.   Will have the PA at his cardiologist office check it for him.  The second lesion he has is showing a lot of "blood" in it.  So will get back with you to discuss next step for it.

## 2013-04-16 NOTE — Telephone Encounter (Signed)
Fwd. To Dr.[name] for info. Todd [name], Todd [name]

## 2013-04-16 NOTE — Patient Instructions (Addendum)
If area on Rt chest enlarges or swells or drains, call us or Dr. Mauricio Po.  Follow up with Dr. Allyson Sabal in 3-4 weeks, I will have him address timing of Chemo and Xarelto vs. coumadin with using chemo

## 2013-04-17 DIAGNOSIS — R238 Other skin changes: Secondary | ICD-10-CM | POA: Insufficient documentation

## 2013-04-17 NOTE — Assessment & Plan Note (Signed)
No chest pain

## 2013-04-17 NOTE — Assessment & Plan Note (Signed)
Much improved.  Wt stable, slightly higher than in hospital

## 2013-04-17 NOTE — Assessment & Plan Note (Signed)
On Xarelto pt is concerned that he may not be able to continue if resumes chemo- may need to change to coumadin.  He will discuss with Dr. Teressa Lower. He does now want Lovenox.

## 2013-04-17 NOTE — Assessment & Plan Note (Signed)
May need to resume chemo.  Pt would Like Dr. Hazle Coca opinion on resuming now- but first pt meets with Dr. Teressa Lower on Wed to discuss.

## 2013-04-17 NOTE — Assessment & Plan Note (Signed)
controlled 

## 2013-04-17 NOTE — Assessment & Plan Note (Signed)
At site of electrodes.  Continue to use antibiotic ointment, notify us if site increases in size

## 2013-04-18 ENCOUNTER — Other Ambulatory Visit (HOSPITAL_BASED_OUTPATIENT_CLINIC_OR_DEPARTMENT_OTHER): Payer: Medicare Other | Admitting: Lab

## 2013-04-18 ENCOUNTER — Inpatient Hospital Stay: Payer: Medicare Other

## 2013-04-18 ENCOUNTER — Ambulatory Visit (HOSPITAL_BASED_OUTPATIENT_CLINIC_OR_DEPARTMENT_OTHER): Payer: Medicare Other | Admitting: Oncology

## 2013-04-18 ENCOUNTER — Telehealth: Payer: Self-pay | Admitting: *Deleted

## 2013-04-18 ENCOUNTER — Telehealth: Payer: Self-pay | Admitting: Oncology

## 2013-04-18 VITALS — BP 126/63 | HR 68 | Temp 97.7°F | Resp 19 | Ht 69.0 in | Wt 204.6 lb

## 2013-04-18 DIAGNOSIS — C189 Malignant neoplasm of colon, unspecified: Secondary | ICD-10-CM

## 2013-04-18 DIAGNOSIS — C787 Secondary malignant neoplasm of liver and intrahepatic bile duct: Secondary | ICD-10-CM

## 2013-04-18 LAB — CBC WITH DIFFERENTIAL/PLATELET
Eosinophils Absolute: 0.2 10*3/uL (ref 0.0–0.5)
LYMPH%: 19 % (ref 14.0–49.0)
MCV: 90.3 fL (ref 79.3–98.0)
MONO%: 5.8 % (ref 0.0–14.0)
NEUT#: 4.9 10*3/uL (ref 1.5–6.5)
NEUT%: 71.3 % (ref 39.0–75.0)
Platelets: 172 10*3/uL (ref 140–400)
RBC: 4 10*6/uL — ABNORMAL LOW (ref 4.20–5.82)

## 2013-04-18 NOTE — Telephone Encounter (Signed)
Per staff message and POF I have scheduled appts.  JMW  

## 2013-04-18 NOTE — Telephone Encounter (Signed)
Gave pt appt for lab and MD for October , emailed michelle regarding chemo for October 2014

## 2013-04-18 NOTE — Progress Notes (Signed)
Hematology and Oncology Follow Up Visit  [name] [name] [id number] 1930-07-20 79 y.o. 04/18/2013 10:44 AM [name], MDBreen, [name], MD   Principle Diagnosis: 79 year old with stage IV colon cancer diagnosed in 08/2012. He presented with a colonic mass and liver lesions.  KRAS wild type.   Prior Therapy: He is S/P LAPAROSCOPIC ASSISTED SIGMOID COLON RESECTION, END Left OSTOMY, Hartmann's pouch and acement of power port done on 09/07/2012.   Current therapy:  He started FOLFIRI and Avastin on 3/26. Avastin was stopped after 4 cycles due to epistaxis. He is S/P 8 cycles of treatment last one given on 01/24/2013. He has been on a treatment break for the last 3 months.   Interim History: Mr. Mccaslin presents today for a follow up visit. He is a very nice man with stage IV colon cancer. He under went surgical resection on 09/07/2012 and have done well chemotherapy in general, but after 8 cycles of chemotherapy he has been on a treatment break. Since his last visit, he was hospitalized briefly in the early parts of September for congestive heart failure and developed pulmonary embolism. He is currently on Xarelto and doing reasonably well. His symptoms has improved dramatically with decreased shortness of breath and stable weight. He is continue be on diuretics and he is monitoring his intake of a closely as well as his weight. His energy has improved as well as his appetite. He reports no chest pain, shortness of breath, abdominal pain, nausea, or vomiting. Appetite is improved and his weight is stable. He reports some dry cough and slight congestion and times.   Medications: I have reviewed the patient's current medications.  Current Outpatient Prescriptions  Medication Sig Dispense Refill  . aspirin 81 MG tablet Take 81 mg by mouth daily.        . benazepril (LOTENSIN) 10 MG tablet Take 1 tablet (10 mg total) by mouth daily.  90 tablet  3  . chlorhexidine (PERIDEX) 0.12 % solution Use as directed 15  mLs in the mouth or throat 2 (two) times daily. Use as directed for mouth irritation      . diclofenac (VOLTAREN) 75 MG EC tablet Take 1 tablet (75 mg total) by mouth 2 (two) times daily as needed.  180 tablet  0  . diclofenac sodium (VOLTAREN) 1 % GEL Apply 4 g topically 4 (four) times daily.  100 g  2  . diphenoxylate-atropine (LOMOTIL) 2.5-0.025 MG per tablet Take 2 tablets by mouth 4 (four) times daily as needed for diarrhea or loose stools.  60 tablet  1  . doxazosin (CARDURA) 8 MG tablet Take 4 mg by mouth 2 (two) times daily.       Marland Kitchen gabapentin (NEURONTIN) 300 MG capsule Take 300 mg by mouth as needed.      . hydrochlorothiazide (HYDRODIURIL) 25 MG tablet TAKE 1 TABLET BY MOUTH EVERY DAY  90 tablet  3  . isosorbide mononitrate (IMDUR) 60 MG 24 hr tablet TAKE 1 TABLET BY MOUTH EVERY DAY  90 tablet  3  . lidocaine-prilocaine (EMLA) cream Apply topically as needed. Prior to chemotherapy treatment, Apply approx 1/2 teaspoon to skin over port, do not rub in. Cover with plastic,  30 g  1  . metoprolol succinate (TOPROL-XL) 50 MG 24 hr tablet Take 50 mg by mouth daily. Take with or immediately following a meal.      . Multiple Vitamin (MULTIVITAMIN) tablet Take 1 tablet by mouth daily.        Marland Kitchen  mupirocin cream (BACTROBAN) 2 % Apply topically 2 (two) times daily.  30 g  1  . nitroGLYCERIN (NITROSTAT) 0.4 MG SL tablet Place 0.4 mg under the tongue every 5 (five) minutes as needed.        . Nutritional Supplements (ENSURE PO) Take 1 Can by mouth daily.      Bertram Gala Glycol-Propyl Glycol (SYSTANE OP) Place 1 drop into both eyes daily.       . prochlorperazine (COMPAZINE) 10 MG tablet Take 1 tablet (10 mg total) by mouth every 6 (six) hours as needed.  30 tablet  1  . Rivaroxaban (XARELTO) 15 MG TABS tablet Take 1 tablet (15 mg total) by mouth 2 (two) times daily with a meal.  40 tablet  0  . [START ON 04/27/2013] Rivaroxaban (XARELTO) 20 MG TABS tablet Take 1 tablet (20 mg total) by mouth daily.  30  tablet  0  . simvastatin (ZOCOR) 40 MG tablet Take 20 mg by mouth every morning.      . sodium chloride (OCEAN) 0.65 % SOLN nasal spray Place 1 spray into the nose as needed for congestion.  1 Bottle  0  . torsemide (DEMADEX) 20 MG tablet Take 1 tablet (20 mg total) by mouth every evening.  90 tablet  3  . VITAMIN E PO Take 1 capsule by mouth at bedtime.       No current facility-administered medications for this visit.    Allergies: No Known Allergies  Past Medical History, Surgical history, Social history, and Family History were reviewed and updated.  Review of Systems:  Remaining ROS negative.  Physical Exam: Blood pressure 126/63, pulse 68, temperature 97.7 F (36.5 C), temperature source Oral, resp. rate 19, height 5\' 9"  (1.753 m), weight 204 lb 9.6 oz (92.806 kg). ECOG: 1 General appearance: alert Head: Normocephalic, without obvious abnormality, atraumatic Neck: no adenopathy, no carotid bruit, no JVD, supple, symmetrical, trachea midline and thyroid not enlarged, symmetric, no tenderness/mass/nodules Lymph nodes: Cervical, supraclavicular, and axillary nodes normal. Heart:regular rate and rhythm, S1, S2 normal, no murmur, click, rub or gallop Lung:chest clear, no wheezing, rales, normal symmetric air entry Abdomen: soft, non-tender, without masses or organomegaly EXT:no erythema, induration, or nodules.  1 to 2 + edema.    Lab Results: Lab Results  Component Value Date   WBC 6.9 04/18/2013   HGB 11.9* 04/18/2013   HCT 36.1* 04/18/2013   MCV 90.3 04/18/2013   PLT 172 04/18/2013     Chemistry      Component Value Date/Time   NA 139 04/04/2013 0538   NA 144 03/28/2013 0816   K 3.6 04/04/2013 0538   K 4.3 03/28/2013 0816   CL 103 04/04/2013 0538   CL 105 01/10/2013 0831   CO2 27 04/04/2013 0538   CO2 22 03/28/2013 0816   BUN 17 04/04/2013 0538   BUN 20.3 03/28/2013 0816   CREATININE 0.94 04/04/2013 0538   CREATININE 1.0 03/28/2013 0816   CREATININE 0.76 06/27/2012 0856       Component Value Date/Time   CALCIUM 9.1 04/04/2013 0538   CALCIUM 9.2 03/28/2013 0816   ALKPHOS 47 04/01/2013 1350   ALKPHOS 44 03/28/2013 0816   AST 25 04/01/2013 1350   AST 21 03/28/2013 0816   ALT 16 04/01/2013 1350   ALT 15 03/28/2013 0816   BILITOT 0.9 04/01/2013 1350   BILITOT 1.10 03/28/2013 0816     Impression and Plan:  79 year old man with :  1. Stage IV  colon cancer. He is S/P sigmoid colon resection with a bulky tumor in the liver. On palliative chemotherapy with FOLFIRI/Avastin. He is S/P cycle 8 cycles. CT scan results from 7/14 (after 8 cycles) showed slight decline in tumor burden. I discussed risks and benefits of restarting chemotherapy at this time. I feel a longer break can cause his cancer to grow more and result in further decline in his health. He is willing to start again next week with reduced dose FOLFIRI without avastin.    2. Port management. Port in place.   3. Congestion/cough and shortness of breath: mostly due to CHF. This has improved dramatically at this time.  4. Epistaxis: improved off Avastin.   5. Diarrhea. Due to Irinotecan. Recommend that he continue Imodium and Lomotil as needed.   6. Follow up: on 10/1 for the resumption of chemotherapy and on 10/15 for a follow up.   Terresa Marlett 9/24/201410:44 AM

## 2013-04-19 ENCOUNTER — Encounter: Payer: Self-pay | Admitting: Cardiology

## 2013-04-25 ENCOUNTER — Ambulatory Visit (HOSPITAL_BASED_OUTPATIENT_CLINIC_OR_DEPARTMENT_OTHER): Payer: Medicare Other

## 2013-04-25 ENCOUNTER — Other Ambulatory Visit (HOSPITAL_BASED_OUTPATIENT_CLINIC_OR_DEPARTMENT_OTHER): Payer: Medicare Other | Admitting: Lab

## 2013-04-25 VITALS — BP 124/64 | HR 58 | Temp 97.0°F | Resp 20

## 2013-04-25 DIAGNOSIS — C787 Secondary malignant neoplasm of liver and intrahepatic bile duct: Secondary | ICD-10-CM

## 2013-04-25 DIAGNOSIS — C187 Malignant neoplasm of sigmoid colon: Secondary | ICD-10-CM

## 2013-04-25 DIAGNOSIS — Z5111 Encounter for antineoplastic chemotherapy: Secondary | ICD-10-CM

## 2013-04-25 DIAGNOSIS — C189 Malignant neoplasm of colon, unspecified: Secondary | ICD-10-CM

## 2013-04-25 LAB — CBC WITH DIFFERENTIAL/PLATELET
BASO%: 0.6 % (ref 0.0–2.0)
EOS%: 6.1 % (ref 0.0–7.0)
LYMPH%: 16.5 % (ref 14.0–49.0)
MCH: 29.1 pg (ref 27.2–33.4)
MCHC: 32.3 g/dL (ref 32.0–36.0)
MCV: 90 fL (ref 79.3–98.0)
MONO#: 0.5 10*3/uL (ref 0.1–0.9)
MONO%: 6.1 % (ref 0.0–14.0)
Platelets: 174 10*3/uL (ref 140–400)
RBC: 3.99 10*6/uL — ABNORMAL LOW (ref 4.20–5.82)
WBC: 7.7 10*3/uL (ref 4.0–10.3)
nRBC: 0 % (ref 0–0)

## 2013-04-25 LAB — COMPREHENSIVE METABOLIC PANEL (CC13)
ALT: 13 U/L (ref 0–55)
AST: 20 U/L (ref 5–34)
Alkaline Phosphatase: 44 U/L (ref 40–150)
BUN: 42.7 mg/dL — ABNORMAL HIGH (ref 7.0–26.0)
Creatinine: 1.2 mg/dL (ref 0.7–1.3)
Total Bilirubin: 0.81 mg/dL (ref 0.20–1.20)

## 2013-04-25 MED ORDER — SODIUM CHLORIDE 0.9 % IJ SOLN
10.0000 mL | INTRAMUSCULAR | Status: DC | PRN
  Filled 2013-04-25: qty 10

## 2013-04-25 MED ORDER — FLUOROURACIL CHEMO INJECTION 2.5 GM/50ML
300.0000 mg/m2 | Freq: Once | INTRAVENOUS | Status: AC
  Administered 2013-04-25: 650 mg via INTRAVENOUS
  Filled 2013-04-25: qty 13

## 2013-04-25 MED ORDER — DEXAMETHASONE SODIUM PHOSPHATE 20 MG/5ML IJ SOLN
INTRAMUSCULAR | Status: AC
  Filled 2013-04-25: qty 5

## 2013-04-25 MED ORDER — ATROPINE SULFATE 1 MG/ML IJ SOLN
0.5000 mg | Freq: Once | INTRAMUSCULAR | Status: AC | PRN
  Administered 2013-04-25: 0.5 mg via INTRAVENOUS

## 2013-04-25 MED ORDER — DEXAMETHASONE SODIUM PHOSPHATE 20 MG/5ML IJ SOLN
20.0000 mg | Freq: Once | INTRAMUSCULAR | Status: AC
  Administered 2013-04-25: 20 mg via INTRAVENOUS

## 2013-04-25 MED ORDER — IRINOTECAN HCL CHEMO INJECTION 100 MG/5ML
65.0000 mg/m2 | Freq: Once | INTRAVENOUS | Status: AC
  Administered 2013-04-25: 140 mg via INTRAVENOUS
  Filled 2013-04-25: qty 7

## 2013-04-25 MED ORDER — SODIUM CHLORIDE 0.9 % IV SOLN
Freq: Once | INTRAVENOUS | Status: AC
  Administered 2013-04-25: 09:00:00 via INTRAVENOUS

## 2013-04-25 MED ORDER — SODIUM CHLORIDE 0.9 % IV SOLN
1800.0000 mg/m2 | INTRAVENOUS | Status: DC
  Administered 2013-04-25: 3900 mg via INTRAVENOUS
  Filled 2013-04-25: qty 78

## 2013-04-25 MED ORDER — ONDANSETRON 16 MG/50ML IVPB (CHCC)
INTRAVENOUS | Status: AC
  Filled 2013-04-25: qty 16

## 2013-04-25 MED ORDER — ONDANSETRON 16 MG/50ML IVPB (CHCC)
16.0000 mg | Freq: Once | INTRAVENOUS | Status: AC
  Administered 2013-04-25: 16 mg via INTRAVENOUS

## 2013-04-25 MED ORDER — HEPARIN SOD (PORK) LOCK FLUSH 100 UNIT/ML IV SOLN
500.0000 [IU] | Freq: Once | INTRAVENOUS | Status: DC | PRN
  Filled 2013-04-25: qty 5

## 2013-04-25 MED ORDER — ATROPINE SULFATE 1 MG/ML IJ SOLN
INTRAMUSCULAR | Status: AC
  Filled 2013-04-25: qty 1

## 2013-04-25 MED ORDER — LEUCOVORIN CALCIUM INJECTION 350 MG
650.0000 mg | Freq: Once | INTRAVENOUS | Status: AC
  Administered 2013-04-25: 650 mg via INTRAVENOUS
  Filled 2013-04-25: qty 32.5

## 2013-04-25 NOTE — Patient Instructions (Addendum)
[facility] Discharge Instructions for Patients Receiving Chemotherapy  Today you received the following chemotherapy agents CAMPTOSAR, LEUCOVORIN, 5FU push, 5FU pump  To help prevent nausea and vomiting after your treatment, we encourage you to take your nausea medication if needed. May start about 5 pm.   If you develop nausea and vomiting that is not controlled by your nausea medication, call the clinic.   BELOW ARE SYMPTOMS THAT SHOULD BE REPORTED IMMEDIATELY:  *FEVER GREATER THAN 100.5 F  *CHILLS WITH OR WITHOUT FEVER  NAUSEA AND VOMITING THAT IS NOT CONTROLLED WITH YOUR NAUSEA MEDICATION  *UNUSUAL SHORTNESS OF BREATH  *UNUSUAL BRUISING OR BLEEDING  TENDERNESS IN MOUTH AND THROAT WITH OR WITHOUT PRESENCE OF ULCERS  *URINARY PROBLEMS  *BOWEL PROBLEMS  UNUSUAL RASH Items with * indicate a potential emergency and should be followed up as soon as possible.  Feel free to call the clinic you have any questions or concerns. The clinic phone number is [phone].

## 2013-04-25 NOTE — Progress Notes (Signed)
Noticeable red patch over to of right foot approx 3" in diameter. Pt. stated that this began on Saturday. Area feels itchy and painful. Tender to touch. Hx of gout and blood clot(was admitted appr. 3 weeks ago as stated).  Bilateral 1+ pitting edema noted.  Rt. foot temp warmer than left. Dr. Clelia Croft notified and in to assess pt.  Will  reassess use of antibiotic ointment or hydrocortisone ointment for pt.  Pt. will continue to monitor and report worsening of symptoms. Proceed with tmt today per Dr. Clelia Croft.  Both pt. And wife aware. Brita Romp RN notified of above.  HL

## 2013-04-26 ENCOUNTER — Ambulatory Visit (HOSPITAL_BASED_OUTPATIENT_CLINIC_OR_DEPARTMENT_OTHER): Payer: Medicare Other

## 2013-04-26 VITALS — BP 100/56 | HR 54 | Temp 97.6°F | Resp 20

## 2013-04-26 DIAGNOSIS — C189 Malignant neoplasm of colon, unspecified: Secondary | ICD-10-CM

## 2013-04-26 DIAGNOSIS — C787 Secondary malignant neoplasm of liver and intrahepatic bile duct: Secondary | ICD-10-CM

## 2013-04-26 MED ORDER — SODIUM CHLORIDE 0.9 % IJ SOLN
10.0000 mL | INTRAMUSCULAR | Status: DC | PRN
  Administered 2013-04-26: 10 mL
  Filled 2013-04-26: qty 10

## 2013-04-26 MED ORDER — HEPARIN SOD (PORK) LOCK FLUSH 100 UNIT/ML IV SOLN
500.0000 [IU] | Freq: Once | INTRAVENOUS | Status: AC | PRN
  Administered 2013-04-26: 500 [IU]
  Filled 2013-04-26: qty 5

## 2013-04-26 NOTE — Progress Notes (Signed)
Pt walked in for pump DC. 28 hour pump connected on 10/1. Denies diarrhea/ nausea. Pump disconnected. Port a cath site unremarkable.

## 2013-04-27 ENCOUNTER — Other Ambulatory Visit (HOSPITAL_COMMUNITY): Payer: Self-pay | Admitting: Family Medicine

## 2013-04-27 NOTE — Telephone Encounter (Signed)
Forward to Md. Burnard Hawthorne

## 2013-05-09 ENCOUNTER — Other Ambulatory Visit (HOSPITAL_BASED_OUTPATIENT_CLINIC_OR_DEPARTMENT_OTHER): Payer: Medicare Other | Admitting: Lab

## 2013-05-09 ENCOUNTER — Ambulatory Visit (HOSPITAL_BASED_OUTPATIENT_CLINIC_OR_DEPARTMENT_OTHER): Payer: Medicare Other | Admitting: Oncology

## 2013-05-09 ENCOUNTER — Ambulatory Visit (HOSPITAL_BASED_OUTPATIENT_CLINIC_OR_DEPARTMENT_OTHER): Payer: Medicare Other

## 2013-05-09 ENCOUNTER — Telehealth: Payer: Self-pay | Admitting: Oncology

## 2013-05-09 VITALS — BP 110/60 | HR 63 | Temp 97.2°F | Resp 20 | Ht 69.0 in | Wt 204.6 lb

## 2013-05-09 DIAGNOSIS — Z5111 Encounter for antineoplastic chemotherapy: Secondary | ICD-10-CM

## 2013-05-09 DIAGNOSIS — C787 Secondary malignant neoplasm of liver and intrahepatic bile duct: Secondary | ICD-10-CM

## 2013-05-09 DIAGNOSIS — C187 Malignant neoplasm of sigmoid colon: Secondary | ICD-10-CM

## 2013-05-09 DIAGNOSIS — C189 Malignant neoplasm of colon, unspecified: Secondary | ICD-10-CM

## 2013-05-09 DIAGNOSIS — I509 Heart failure, unspecified: Secondary | ICD-10-CM

## 2013-05-09 DIAGNOSIS — R197 Diarrhea, unspecified: Secondary | ICD-10-CM

## 2013-05-09 LAB — CBC WITH DIFFERENTIAL/PLATELET
BASO%: 0.6 % (ref 0.0–2.0)
Basophils Absolute: 0 10*3/uL (ref 0.0–0.1)
EOS%: 6.8 % (ref 0.0–7.0)
HCT: 33.5 % — ABNORMAL LOW (ref 38.4–49.9)
HGB: 11.1 g/dL — ABNORMAL LOW (ref 13.0–17.1)
MCH: 28.8 pg (ref 27.2–33.4)
MCHC: 33.1 g/dL (ref 32.0–36.0)
MCV: 87 fL (ref 79.3–98.0)
MONO#: 0.4 10*3/uL (ref 0.1–0.9)
MONO%: 5.7 % (ref 0.0–14.0)
NEUT%: 67.6 % (ref 39.0–75.0)
RBC: 3.85 10*6/uL — ABNORMAL LOW (ref 4.20–5.82)
WBC: 6.3 10*3/uL (ref 4.0–10.3)

## 2013-05-09 LAB — COMPREHENSIVE METABOLIC PANEL (CC13)
Albumin: 3.3 g/dL — ABNORMAL LOW (ref 3.5–5.0)
Alkaline Phosphatase: 42 U/L (ref 40–150)
Anion Gap: 8 mEq/L (ref 3–11)
BUN: 33.7 mg/dL — ABNORMAL HIGH (ref 7.0–26.0)
CO2: 27 mEq/L (ref 22–29)
Calcium: 9.1 mg/dL (ref 8.4–10.4)
Creatinine: 1.3 mg/dL (ref 0.7–1.3)
Glucose: 134 mg/dl (ref 70–140)
Potassium: 3.7 mEq/L (ref 3.5–5.1)
Total Bilirubin: 0.57 mg/dL (ref 0.20–1.20)
Total Protein: 6.6 g/dL (ref 6.4–8.3)

## 2013-05-09 MED ORDER — ATROPINE SULFATE 1 MG/ML IJ SOLN
INTRAMUSCULAR | Status: AC
  Filled 2013-05-09: qty 1

## 2013-05-09 MED ORDER — DEXAMETHASONE SODIUM PHOSPHATE 20 MG/5ML IJ SOLN
20.0000 mg | Freq: Once | INTRAMUSCULAR | Status: AC
  Administered 2013-05-09: 20 mg via INTRAVENOUS

## 2013-05-09 MED ORDER — FLUOROURACIL CHEMO INJECTION 2.5 GM/50ML
300.0000 mg/m2 | Freq: Once | INTRAVENOUS | Status: AC
  Administered 2013-05-09: 650 mg via INTRAVENOUS
  Filled 2013-05-09: qty 13

## 2013-05-09 MED ORDER — ONDANSETRON 16 MG/50ML IVPB (CHCC)
16.0000 mg | Freq: Once | INTRAVENOUS | Status: AC
  Administered 2013-05-09: 16 mg via INTRAVENOUS

## 2013-05-09 MED ORDER — ATROPINE SULFATE 1 MG/ML IJ SOLN
0.5000 mg | Freq: Once | INTRAMUSCULAR | Status: AC | PRN
  Administered 2013-05-09: 0.5 mg via INTRAVENOUS

## 2013-05-09 MED ORDER — IRINOTECAN HCL CHEMO INJECTION 100 MG/5ML
65.0000 mg/m2 | Freq: Once | INTRAVENOUS | Status: AC
  Administered 2013-05-09: 140 mg via INTRAVENOUS
  Filled 2013-05-09: qty 7

## 2013-05-09 MED ORDER — LEUCOVORIN CALCIUM INJECTION 350 MG
327.0000 mg/m2 | Freq: Once | INTRAVENOUS | Status: AC
  Administered 2013-05-09: 650 mg via INTRAVENOUS
  Filled 2013-05-09: qty 32.5

## 2013-05-09 MED ORDER — FLUOROURACIL CHEMO INJECTION 5 GM/100ML
1800.0000 mg/m2 | INTRAVENOUS | Status: DC
  Administered 2013-05-09: 3900 mg via INTRAVENOUS
  Filled 2013-05-09: qty 78

## 2013-05-09 MED ORDER — SODIUM CHLORIDE 0.9 % IV SOLN
Freq: Once | INTRAVENOUS | Status: AC
  Administered 2013-05-09: 11:00:00 via INTRAVENOUS

## 2013-05-09 MED ORDER — ONDANSETRON 16 MG/50ML IVPB (CHCC)
INTRAVENOUS | Status: AC
  Filled 2013-05-09: qty 16

## 2013-05-09 MED ORDER — DEXAMETHASONE SODIUM PHOSPHATE 20 MG/5ML IJ SOLN
INTRAMUSCULAR | Status: AC
  Filled 2013-05-09: qty 5

## 2013-05-09 NOTE — Patient Instructions (Signed)
[facility] Discharge Instructions for Patients Receiving Chemotherapy  Today you received the following chemotherapy agents camptosar, %fu and leucovorin.  To help prevent nausea and vomiting after your treatment, we encourage you to take your nausea medication.   If you develop nausea and vomiting that is not controlled by your nausea medication, call the clinic.   BELOW ARE SYMPTOMS THAT SHOULD BE REPORTED IMMEDIATELY:  *FEVER GREATER THAN 100.5 F  *CHILLS WITH OR WITHOUT FEVER  NAUSEA AND VOMITING THAT IS NOT CONTROLLED WITH YOUR NAUSEA MEDICATION  *UNUSUAL SHORTNESS OF BREATH  *UNUSUAL BRUISING OR BLEEDING  TENDERNESS IN MOUTH AND THROAT WITH OR WITHOUT PRESENCE OF ULCERS  *URINARY PROBLEMS  *BOWEL PROBLEMS  UNUSUAL RASH Items with * indicate a potential emergency and should be followed up as soon as possible.  Feel free to call the clinic you have any questions or concerns. The clinic phone number is [phone].

## 2013-05-09 NOTE — Progress Notes (Signed)
Hematology and Oncology Follow Up Visit  Todd [name] [id number] 25-May-1930 80 y.o. 05/09/2013 10:08 AM Todd [name], MDBreen, Todd Lamas, MD   Principle Diagnosis: 79 year old with stage IV colon cancer diagnosed in 08/2012. He presented with a colonic mass and liver lesions.  KRAS wild type.   Prior Therapy: He is S/P LAPAROSCOPIC ASSISTED SIGMOID COLON RESECTION, END Left OSTOMY, Hartmann's pouch and acement of power port done on 09/07/2012.   Current therapy:  He started FOLFIRI and Avastin on 3/26. Avastin was stopped after 4 cycles due to epistaxis. He is S/P 8 cycles of treatment last one given on 01/24/2013. He has been on a treatment break for the last 3 months.  FOLFIRI restarted on 10/1 with reduced doses.   Interim History: Todd [name] presents today for a follow up visit. He is a very nice man with stage IV colon cancer. He under went surgical resection on 09/07/2012 and have done well chemotherapy in general, but after 8 cycles of chemotherapy he has been on a treatment break.  He was started on FOLFIRI at a reduced dose and received the first cycle of that without any complications. Has not reported any nausea or vomiting. Has not reported any diarrhea or constipation. He reported grade 2 fatigue but otherwise no other complications. He still able to perform most activities of daily living without any hindrance or decline. His lower extremity edema has significantly improved at this time.   Medications: I have reviewed the patient's current medications.  Current Outpatient Prescriptions  Medication Sig Dispense Refill  . aspirin 81 MG tablet Take 81 mg by mouth daily.        . benazepril (LOTENSIN) 10 MG tablet Take 1 tablet (10 mg total) by mouth daily.  90 tablet  3  . chlorhexidine (PERIDEX) 0.12 % solution Use as directed 15 mLs in the mouth or throat 2 (two) times daily. Use as directed for mouth irritation      . diclofenac (VOLTAREN) 75 MG EC tablet Take 1 tablet (75 mg total)  by mouth 2 (two) times daily as needed.  180 tablet  0  . diclofenac sodium (VOLTAREN) 1 % GEL Apply 4 g topically 4 (four) times daily.  100 g  2  . diphenoxylate-atropine (LOMOTIL) 2.5-0.025 MG per tablet Take 2 tablets by mouth 4 (four) times daily as needed for diarrhea or loose stools.  60 tablet  1  . doxazosin (CARDURA) 8 MG tablet Take 4 mg by mouth 2 (two) times daily.       Marland Kitchen gabapentin (NEURONTIN) 300 MG capsule Take 300 mg by mouth as needed.      . hydrochlorothiazide (HYDRODIURIL) 25 MG tablet TAKE 1 TABLET BY MOUTH EVERY DAY  90 tablet  3  . isosorbide mononitrate (IMDUR) 60 MG 24 hr tablet TAKE 1 TABLET BY MOUTH EVERY DAY  90 tablet  3  . lidocaine-prilocaine (EMLA) cream Apply topically as needed. Prior to chemotherapy treatment, Apply approx 1/2 teaspoon to skin over port, do not rub in. Cover with plastic,  30 g  1  . metoprolol succinate (TOPROL-XL) 50 MG 24 hr tablet Take 50 mg by mouth daily. Take with or immediately following a meal.      . Multiple Vitamin (MULTIVITAMIN) tablet Take 1 tablet by mouth daily.        . mupirocin cream (BACTROBAN) 2 % Apply topically 2 (two) times daily.  30 g  1  . nitroGLYCERIN (NITROSTAT) 0.4 MG SL tablet Place  0.4 mg under the tongue every 5 (five) minutes as needed.        . Nutritional Supplements (ENSURE PO) Take 1 Can by mouth daily.      Todd [name] (SYSTANE OP) Place 1 drop into both eyes daily.       . prochlorperazine (COMPAZINE) 10 MG tablet Take 1 tablet (10 mg total) by mouth every 6 (six) hours as needed.  30 tablet  1  . Rivaroxaban (XARELTO) 15 MG TABS tablet Take 1 tablet (15 mg total) by mouth 2 (two) times daily with a meal.  40 tablet  0  . simvastatin (ZOCOR) 40 MG tablet Take 20 mg by mouth every morning.      . sodium chloride (OCEAN) 0.65 % SOLN nasal spray Place 1 spray into the nose as needed for congestion.  1 Bottle  0  . torsemide (DEMADEX) 20 MG tablet Take 1 tablet (20 mg total) by mouth  every evening.  90 tablet  3  . VITAMIN E PO Take 1 capsule by mouth at bedtime.      Todd [name] 20 MG TABS tablet TAKE 1 TABLET BY MOUTH DAILY  30 tablet  0   No current facility-administered medications for this visit.    Allergies: No Known Allergies  Past Medical History, Surgical history, Social history, and Family History were reviewed and updated.  Review of Systems:  Remaining ROS negative.  Physical Exam: Blood pressure 110/60, pulse 63, temperature 97.2 F (36.2 C), temperature source Oral, resp. rate 20, height 5\' 9"  (1.753 m), weight 204 lb 9.6 oz (92.806 kg). ECOG: 1 General appearance: alert Head: Normocephalic, without obvious abnormality, atraumatic Neck: no adenopathy, no carotid bruit, no JVD, supple, symmetrical, trachea midline and thyroid not enlarged, symmetric, no tenderness/mass/nodules Lymph nodes: Cervical, supraclavicular, and axillary nodes normal. Heart:regular rate and rhythm, S1, S2 normal, no murmur, click, rub or gallop Lung:chest clear, no wheezing, rales, normal symmetric air entry Abdomen: soft, non-tender, without masses or organomegaly EXT:no erythema, induration, or nodules.  1 + edema.    Lab Results: Lab Results  Component Value Date   WBC 6.3 05/09/2013   HGB 11.1* 05/09/2013   HCT 33.5* 05/09/2013   MCV 87.0 05/09/2013   PLT 142 05/09/2013     Chemistry      Component Value Date/Time   NA 145 04/25/2013 0809   NA 139 04/04/2013 0538   K 3.9 04/25/2013 0809   K 3.6 04/04/2013 0538   CL 103 04/04/2013 0538   CL 105 01/10/2013 0831   CO2 25 04/25/2013 0809   CO2 27 04/04/2013 0538   BUN 42.7* 04/25/2013 0809   BUN 17 04/04/2013 0538   CREATININE 1.2 04/25/2013 0809   CREATININE 0.94 04/04/2013 0538   CREATININE 0.76 06/27/2012 0856      Component Value Date/Time   CALCIUM 9.6 04/25/2013 0809   CALCIUM 9.1 04/04/2013 0538   ALKPHOS 44 04/25/2013 0809   ALKPHOS 47 04/01/2013 1350   AST 20 04/25/2013 0809   AST 25 04/01/2013 1350   ALT 13  04/25/2013 0809   ALT 16 04/01/2013 1350   BILITOT 0.81 04/25/2013 0809   BILITOT 0.9 04/01/2013 1350     Impression and Plan:  79 year old man with :  1. Stage IV colon cancer. He is S/P sigmoid colon resection with a bulky tumor in the liver. On palliative chemotherapy with FOLFIRI/Avastin. He is S/P cycle 8 cycles. CT scan results from 7/14 (after 8 cycles)  showed slight decline in tumor burden.  He is status post reduced dose FOLFIRI and tolerated it well the plan is to proceed with another treatment today and every 2 weeks. His laboratory data and physical exam is not prohibitive at this time.   2. Port management. Port in place.   3. Congestion/cough and shortness of breath: mostly due to CHF. This has improved dramatically at this time.  4. Epistaxis: improved off Avastin.   5. Diarrhea. Due to Irinotecan. Recommend that he continue Imodium and Lomotil as needed.   6. Follow up: on 10/29 for the next chemotherapy.    Naomi Castrogiovanni 10/15/201410:08 AM

## 2013-05-09 NOTE — Telephone Encounter (Signed)
gv pt appt schedule for October.  °

## 2013-05-10 NOTE — Telephone Encounter (Signed)
error 

## 2013-05-11 ENCOUNTER — Ambulatory Visit (HOSPITAL_BASED_OUTPATIENT_CLINIC_OR_DEPARTMENT_OTHER): Payer: Medicare Other

## 2013-05-11 VITALS — BP 105/55 | HR 53 | Temp 97.5°F

## 2013-05-11 DIAGNOSIS — C189 Malignant neoplasm of colon, unspecified: Secondary | ICD-10-CM

## 2013-05-11 DIAGNOSIS — C787 Secondary malignant neoplasm of liver and intrahepatic bile duct: Secondary | ICD-10-CM

## 2013-05-11 MED ORDER — HEPARIN SOD (PORK) LOCK FLUSH 100 UNIT/ML IV SOLN
500.0000 [IU] | Freq: Once | INTRAVENOUS | Status: AC | PRN
  Administered 2013-05-11: 500 [IU]
  Filled 2013-05-11: qty 5

## 2013-05-11 MED ORDER — SODIUM CHLORIDE 0.9 % IJ SOLN
10.0000 mL | INTRAMUSCULAR | Status: DC | PRN
  Administered 2013-05-11: 10 mL
  Filled 2013-05-11: qty 10

## 2013-05-21 ENCOUNTER — Encounter: Payer: Self-pay | Admitting: Cardiovascular Disease

## 2013-05-21 ENCOUNTER — Ambulatory Visit (INDEPENDENT_AMBULATORY_CARE_PROVIDER_SITE_OTHER): Payer: Medicare Other | Admitting: Cardiovascular Disease

## 2013-05-21 VITALS — BP 108/66 | HR 56 | Ht 69.5 in | Wt 200.5 lb

## 2013-05-21 DIAGNOSIS — I2589 Other forms of chronic ischemic heart disease: Secondary | ICD-10-CM

## 2013-05-21 DIAGNOSIS — I10 Essential (primary) hypertension: Secondary | ICD-10-CM

## 2013-05-21 DIAGNOSIS — I2699 Other pulmonary embolism without acute cor pulmonale: Secondary | ICD-10-CM

## 2013-05-21 DIAGNOSIS — I255 Ischemic cardiomyopathy: Secondary | ICD-10-CM

## 2013-05-21 DIAGNOSIS — E785 Hyperlipidemia, unspecified: Secondary | ICD-10-CM

## 2013-05-21 NOTE — Assessment & Plan Note (Signed)
On statin therapy with recent lipid profile revealed a total cholesterol of 114, LDL of 53 an HDL of 48

## 2013-05-21 NOTE — Assessment & Plan Note (Signed)
Todd [name] LAD infarct with ischemic cardiopathy an EF of 30% range. He denies chest pain. He is other medications. I offered him the possibility of an ICD for primary prevention which he declined.

## 2013-05-21 NOTE — Assessment & Plan Note (Signed)
Well-controlled on current medications 

## 2013-05-21 NOTE — Assessment & Plan Note (Signed)
On Xarelto anticoagulation 

## 2013-05-21 NOTE — Patient Instructions (Signed)
Your physician wants you to follow-up in: 6 months with Nada Boozer, NP.            12 months with Dr Allyson Sabal. You will receive a reminder letter in the mail two months in advance. If you don't receive a letter, please call our office to schedule the follow-up appointment.

## 2013-05-21 NOTE — Progress Notes (Signed)
05/21/2013 Todd [name]   Jun 30, 1930  [id number]  Primary Physician [name], MD Primary Cardiologist: Runell Gess MD [name]   HPI:  79 year old, mildly overweight, married Caucasian male, father of 2, grandfather to 5 grandchildren without is formerly a [name] of Dr. Caprice Kluver. Dr. Allyson Sabal assumed his care in Dr. Fredirick Maudlin absence. He has a history of ischemic heart disease status post anterior wall myocardial infarction in 1990, at which time he underwent cardiac catheterization revealing a total LAD with collaterals. His EF at that time was 30%. He has had no evidence of congestive heart failure. His other problems include hypertension and hyperlipidemia as well as gout. He was recently diagnosed with colon cancer and underwent resection by Dr. Ovidio Kin with a colostomy. He has undergone chemotherapy administered by Dr. Clelia Croft with reduced dose FOLFIRI with Avastin. A 2-D echo performed 09/2012 revealed an EF in the 35% range with wall motion abnormality consistent with his LAD occlusion. He had grade 2 diastolic dysfunction.  He was recently hospitalized September 7 through 04/05/2013 for Lt pulmonary emboli and congestive heart failure exacerbation. His dry weight is considered 200-205 pounds. During hospitalization he was placed on IV Lasix and at discharge changed back to torsemide. He also underwent anticoagulation. [name] is on Xarelto and would prefer to stay on the medication though he would switch to Coumadin if Dr. Venda Rodes feels that is necessary. He does not want to take Lovenox on routine basis.  Today wt is stable at 208, ( wt at discharge was 203) he has no chest pain, + SOB though overall improved.  He is concerned about resuming chemo. He is to see Dr. Teressa Lower on Wed. This week for discussion. They will then decide to proceed or not. Pt wonders if he is ready for chemo this close to Pul embolus.He is currently on Xarelto anticoagulation. He denies chest  pain or shortness of breath. He did have a conversation about ICD therapy for primary prevention which he has declined   Current Outpatient Prescriptions  Medication Sig Dispense Refill  . aspirin 81 MG tablet Take 81 mg by mouth daily.        . benazepril (LOTENSIN) 10 MG tablet Take 1 tablet (10 mg total) by mouth daily.  90 tablet  3  . chlorhexidine (PERIDEX) 0.12 % solution Use as directed 15 mLs in the mouth or throat 2 (two) times daily. Use as directed for mouth irritation      . diclofenac (VOLTAREN) 75 MG EC tablet Take 1 tablet (75 mg total) by mouth 2 (two) times daily as needed.  180 tablet  0  . diclofenac sodium (VOLTAREN) 1 % GEL Apply 4 g topically 4 (four) times daily.  100 g  2  . diphenoxylate-atropine (LOMOTIL) 2.5-0.025 MG per tablet Take 2 tablets by mouth 4 (four) times daily as needed for diarrhea or loose stools.  60 tablet  1  . doxazosin (CARDURA) 8 MG tablet Take 4 mg by mouth 2 (two) times daily.       Marland Kitchen gabapentin (NEURONTIN) 300 MG capsule Take 300 mg by mouth as needed.      . hydrochlorothiazide (HYDRODIURIL) 25 MG tablet TAKE 1 TABLET BY MOUTH EVERY DAY  90 tablet  3  . isosorbide mononitrate (IMDUR) 60 MG 24 hr tablet TAKE 1 TABLET BY MOUTH EVERY DAY  90 tablet  3  . lidocaine-prilocaine (EMLA) cream Apply topically as needed. Prior to chemotherapy treatment, Apply approx 1/2 teaspoon to  skin over port, do not rub in. Cover with plastic,  30 g  1  . metoprolol succinate (TOPROL-XL) 50 MG 24 hr tablet Take 50 mg by mouth daily. Take with or immediately following a meal.      . Multiple Vitamin (MULTIVITAMIN) tablet Take 1 tablet by mouth daily.        . mupirocin cream (BACTROBAN) 2 % Apply topically as needed.      . nitroGLYCERIN (NITROSTAT) 0.4 MG SL tablet Place 0.4 mg under the tongue every 5 (five) minutes as needed.        . Nutritional Supplements (ENSURE PO) Take 1 Can by mouth daily.      Bertram Gala Glycol-Propyl Glycol (SYSTANE OP) Place 1 drop into  both eyes daily.       . prochlorperazine (COMPAZINE) 10 MG tablet Take 1 tablet (10 mg total) by mouth every 6 (six) hours as needed.  30 tablet  1  . simvastatin (ZOCOR) 40 MG tablet Take 20 mg by mouth every morning.      . sodium chloride (OCEAN) 0.65 % SOLN nasal spray Place 1 spray into the nose as needed for congestion.  1 Bottle  0  . torsemide (DEMADEX) 20 MG tablet Take 1 tablet (20 mg total) by mouth every evening.  90 tablet  3  . VITAMIN E PO Take 1 capsule by mouth at bedtime.      [name] 20 MG TABS tablet TAKE 1 TABLET BY MOUTH DAILY  30 tablet  0   No current facility-administered medications for this visit.    No Known Allergies  History   Social History  . Marital Status: Married    Spouse Name: N/A    Number of Children: 2  . Years of Education: N/A   Occupational History  . Retired, food and nutritional services    Social History Main Topics  . Smoking status: Former Smoker -- 1.00 packs/day    Quit date: 04/01/2009  . Smokeless tobacco: Never Used     Comment: Quit 45 years ago.  . Alcohol Use: No  . Drug Use: No  . Sexual Activity: Not Currently   Other Topics Concern  . Not on file   Social History Narrative   Emergency Contact:wife, Dareld Mcauliffe at [phone]   Health Care POA:   Who lives with you: Lives with wife in 1 story home.   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  Has a varied diet and is currently trying to lose weight by decreasing protein and carbohydrates daily.   Exercise: water aerobix 3x a week for 45 mins and uses row bike 2 times a week for 20 minutes   Seatbelts: wears seatbelt while in vehicles   Sun Exposure/Protection: [name] reports wearing sun screen   Hobbies: numbers, cruises              Review of Systems: General: negative for chills, fever, night sweats or weight changes.  Cardiovascular: negative for chest pain, dyspnea on exertion, edema,  orthopnea, palpitations, paroxysmal nocturnal dyspnea or shortness of breath Dermatological: negative for rash Respiratory: negative for cough or wheezing Urologic: negative for hematuria Abdominal: negative for nausea, vomiting, diarrhea, bright red blood per rectum, melena, or hematemesis Neurologic: negative for visual changes, syncope, or dizziness All other systems reviewed and are otherwise negative except as noted above.    Blood pressure 108/66, pulse 56, height 5'  9.5" (1.765 m), weight 200 lb 8 oz (90.946 kg).  General appearance: alert and no distress Neck: no adenopathy, no carotid bruit, no JVD, supple, symmetrical, trachea midline and thyroid not enlarged, symmetric, no tenderness/mass/nodules Lungs: clear to auscultation bilaterally Heart: regular rate and rhythm, S1, S2 normal, no murmur, click, rub or gallop Extremities: 1+ peripheral edema  EKG not performed today  ASSESSMENT AND PLAN:   Ischemic cardiomyopathy, EF 35-40% by echo March 2014 Herbert LAD infarct with ischemic cardiopathy an EF of 30% range. He denies chest pain. He is other medications. I offered him the possibility of an ICD for primary prevention which he declined.  Acute pulmonary embolism- 04/02/13 On Xarelto  anticoagulation  HYPERLIPIDEMIA On statin therapy with recent lipid profile revealed a total cholesterol of 114, LDL of 53 an HDL of 48  Essential hypertension Well-controlled on current medications      Runell Gess MD [facility], [facility] 05/21/2013 11:48 AM

## 2013-05-23 ENCOUNTER — Telehealth: Payer: Self-pay | Admitting: Oncology

## 2013-05-23 ENCOUNTER — Telehealth: Payer: Self-pay | Admitting: *Deleted

## 2013-05-23 ENCOUNTER — Ambulatory Visit (HOSPITAL_BASED_OUTPATIENT_CLINIC_OR_DEPARTMENT_OTHER): Payer: Medicare Other

## 2013-05-23 ENCOUNTER — Other Ambulatory Visit (HOSPITAL_BASED_OUTPATIENT_CLINIC_OR_DEPARTMENT_OTHER): Payer: Medicare Other

## 2013-05-23 ENCOUNTER — Ambulatory Visit (HOSPITAL_BASED_OUTPATIENT_CLINIC_OR_DEPARTMENT_OTHER): Payer: Medicare Other | Admitting: Oncology

## 2013-05-23 VITALS — BP 102/55 | HR 58 | Temp 97.1°F | Resp 20 | Ht 69.5 in | Wt 203.2 lb

## 2013-05-23 DIAGNOSIS — Z5111 Encounter for antineoplastic chemotherapy: Secondary | ICD-10-CM

## 2013-05-23 DIAGNOSIS — C787 Secondary malignant neoplasm of liver and intrahepatic bile duct: Secondary | ICD-10-CM

## 2013-05-23 DIAGNOSIS — R197 Diarrhea, unspecified: Secondary | ICD-10-CM

## 2013-05-23 DIAGNOSIS — C189 Malignant neoplasm of colon, unspecified: Secondary | ICD-10-CM

## 2013-05-23 DIAGNOSIS — C187 Malignant neoplasm of sigmoid colon: Secondary | ICD-10-CM

## 2013-05-23 LAB — COMPREHENSIVE METABOLIC PANEL (CC13)
ALT: 12 U/L (ref 0–55)
Anion Gap: 8 mEq/L (ref 3–11)
BUN: 34 mg/dL — ABNORMAL HIGH (ref 7.0–26.0)
CO2: 24 mEq/L (ref 22–29)
Calcium: 9.2 mg/dL (ref 8.4–10.4)
Chloride: 108 mEq/L (ref 98–109)
Creatinine: 1.4 mg/dL — ABNORMAL HIGH (ref 0.7–1.3)
Glucose: 106 mg/dl (ref 70–140)
Total Bilirubin: 0.86 mg/dL (ref 0.20–1.20)
Total Protein: 6.6 g/dL (ref 6.4–8.3)

## 2013-05-23 LAB — CBC WITH DIFFERENTIAL/PLATELET
Basophils Absolute: 0 10*3/uL (ref 0.0–0.1)
EOS%: 4.8 % (ref 0.0–7.0)
HCT: 32.5 % — ABNORMAL LOW (ref 38.4–49.9)
HGB: 11 g/dL — ABNORMAL LOW (ref 13.0–17.1)
MCH: 29 pg (ref 27.2–33.4)
MCHC: 33.8 g/dL (ref 32.0–36.0)
MCV: 85.8 fL (ref 79.3–98.0)
MONO%: 8.4 % (ref 0.0–14.0)
NEUT%: 70.6 % (ref 39.0–75.0)
RDW: 16.1 % — ABNORMAL HIGH (ref 11.0–14.6)
WBC: 6.9 10*3/uL (ref 4.0–10.3)
lymph#: 1.1 10*3/uL (ref 0.9–3.3)

## 2013-05-23 MED ORDER — SODIUM CHLORIDE 0.9 % IV SOLN
1800.0000 mg/m2 | INTRAVENOUS | Status: DC
  Administered 2013-05-23: 3900 mg via INTRAVENOUS
  Filled 2013-05-23: qty 78

## 2013-05-23 MED ORDER — FLUOROURACIL CHEMO INJECTION 2.5 GM/50ML
300.0000 mg/m2 | Freq: Once | INTRAVENOUS | Status: AC
  Administered 2013-05-23: 650 mg via INTRAVENOUS
  Filled 2013-05-23: qty 13

## 2013-05-23 MED ORDER — ATROPINE SULFATE 1 MG/ML IJ SOLN
0.5000 mg | Freq: Once | INTRAMUSCULAR | Status: AC | PRN
  Administered 2013-05-23: 0.5 mg via INTRAVENOUS

## 2013-05-23 MED ORDER — SODIUM CHLORIDE 0.9 % IV SOLN
Freq: Once | INTRAVENOUS | Status: AC
  Administered 2013-05-23: 10:00:00 via INTRAVENOUS

## 2013-05-23 MED ORDER — ONDANSETRON 16 MG/50ML IVPB (CHCC)
16.0000 mg | Freq: Once | INTRAVENOUS | Status: AC
  Administered 2013-05-23: 16 mg via INTRAVENOUS

## 2013-05-23 MED ORDER — IRINOTECAN HCL CHEMO INJECTION 100 MG/5ML
45.0000 mg/m2 | Freq: Once | INTRAVENOUS | Status: AC
  Administered 2013-05-23: 98 mg via INTRAVENOUS
  Filled 2013-05-23: qty 4.9

## 2013-05-23 MED ORDER — DEXAMETHASONE SODIUM PHOSPHATE 20 MG/5ML IJ SOLN
20.0000 mg | Freq: Once | INTRAMUSCULAR | Status: AC
  Administered 2013-05-23: 20 mg via INTRAVENOUS

## 2013-05-23 MED ORDER — DEXTROSE 5 % IV SOLN
302.0000 mg/m2 | Freq: Once | INTRAVENOUS | Status: AC
  Administered 2013-05-23: 600 mg via INTRAVENOUS
  Filled 2013-05-23: qty 30

## 2013-05-23 MED ORDER — ATROPINE SULFATE 1 MG/ML IJ SOLN
INTRAMUSCULAR | Status: AC
  Filled 2013-05-23: qty 1

## 2013-05-23 MED ORDER — ONDANSETRON 16 MG/50ML IVPB (CHCC)
INTRAVENOUS | Status: AC
  Filled 2013-05-23: qty 16

## 2013-05-23 MED ORDER — DEXAMETHASONE SODIUM PHOSPHATE 20 MG/5ML IJ SOLN
INTRAMUSCULAR | Status: AC
  Filled 2013-05-23: qty 5

## 2013-05-23 NOTE — Telephone Encounter (Signed)
gv and printed appt sched and avs for pt for NOV...sed added tx....pt ok and aware

## 2013-05-23 NOTE — Telephone Encounter (Signed)
Per staff message and POF I have scheduled appts.  JMW  

## 2013-05-23 NOTE — Progress Notes (Signed)
Hematology and Oncology Follow Up Visit  Todd [name] [id number] Jul 06, 1930 79 y.o. 05/23/2013 9:12 AM [name], MDBreen, [name], MD   Principle Diagnosis: 79 year old with stage IV colon cancer diagnosed in 08/2012. He presented with a colonic mass and liver lesions.  KRAS wild type.   Prior Therapy: He is S/P LAPAROSCOPIC ASSISTED SIGMOID COLON RESECTION, END Left OSTOMY, Hartmann's pouch and acement of power port done on 09/07/2012.   Current therapy:  He started FOLFIRI and Avastin on 3/26. Avastin was stopped after 4 cycles due to epistaxis. He is S/P 8 cycles of treatment last one given on 01/24/2013. He has been on a treatment break for the last 3 months.  FOLFIRI restarted on 10/1 with reduced doses (60% of the CPT 11).  He is here before the third cycle of FOLFIRI restart.   Interim History: Todd [name] presents today for a follow up visit. He is a very nice man with stage IV colon cancer. He under went surgical resection on 09/07/2012 and have done well chemotherapy in general, but after 8 cycles of chemotherapy he has been on a treatment break.  He was started on FOLFIRI at a reduced dose and received the third cycle of that without any complications. Has not reported any nausea or vomiting. He has reported more diarrhea and overall weakness. He reported grade 2 fatigue but otherwise no other complications. He still able to perform most activities of daily living without any hindrance or decline. His lower extremity edema has improved transiently but now it appears to have increased somewhat in the last few days.   Medications: I have reviewed the patient's current medications.  Current Outpatient Prescriptions  Medication Sig Dispense Refill  . aspirin 81 MG tablet Take 81 mg by mouth daily.        . benazepril (LOTENSIN) 10 MG tablet Take 1 tablet (10 mg total) by mouth daily.  90 tablet  3  . chlorhexidine (PERIDEX) 0.12 % solution Use as directed 15 mLs in the mouth or throat  2 (two) times daily. Use as directed for mouth irritation      . diclofenac (VOLTAREN) 75 MG EC tablet Take 1 tablet (75 mg total) by mouth 2 (two) times daily as needed.  180 tablet  0  . diclofenac sodium (VOLTAREN) 1 % GEL Apply 4 g topically 4 (four) times daily.  100 g  2  . diphenoxylate-atropine (LOMOTIL) 2.5-0.025 MG per tablet Take 2 tablets by mouth 4 (four) times daily as needed for diarrhea or loose stools.  60 tablet  1  . doxazosin (CARDURA) 8 MG tablet Take 4 mg by mouth 2 (two) times daily.       Marland Kitchen gabapentin (NEURONTIN) 300 MG capsule Take 300 mg by mouth as needed.      . hydrochlorothiazide (HYDRODIURIL) 25 MG tablet TAKE 1 TABLET BY MOUTH EVERY DAY  90 tablet  3  . isosorbide mononitrate (IMDUR) 60 MG 24 hr tablet TAKE 1 TABLET BY MOUTH EVERY DAY  90 tablet  3  . lidocaine-prilocaine (EMLA) cream Apply topically as needed. Prior to chemotherapy treatment, Apply approx 1/2 teaspoon to skin over port, do not rub in. Cover with plastic,  30 g  1  . metoprolol succinate (TOPROL-XL) 50 MG 24 hr tablet Take 50 mg by mouth daily. Take with or immediately following a meal.      . Multiple Vitamin (MULTIVITAMIN) tablet Take 1 tablet by mouth daily.        Marland Kitchen  mupirocin cream (BACTROBAN) 2 % Apply topically as needed.      . nitroGLYCERIN (NITROSTAT) 0.4 MG SL tablet Place 0.4 mg under the tongue every 5 (five) minutes as needed.        . Nutritional Supplements (ENSURE PO) Take 1 Can by mouth daily.      Bertram Gala Glycol-Propyl Glycol (SYSTANE OP) Place 1 drop into both eyes daily.       . prochlorperazine (COMPAZINE) 10 MG tablet Take 1 tablet (10 mg total) by mouth every 6 (six) hours as needed.  30 tablet  1  . simvastatin (ZOCOR) 40 MG tablet Take 20 mg by mouth every morning.      . sodium chloride (OCEAN) 0.65 % SOLN nasal spray Place 1 spray into the nose as needed for congestion.  1 Bottle  0  . torsemide (DEMADEX) 20 MG tablet Take 1 tablet (20 mg total) by mouth every evening.   90 tablet  3  . VITAMIN E PO Take 1 capsule by mouth at bedtime.      Todd [name] 20 MG TABS tablet TAKE 1 TABLET BY MOUTH DAILY  30 tablet  0   No current facility-administered medications for this visit.    Allergies: No Known Allergies  Past Medical History, Surgical history, Social history, and Family History were reviewed and updated.  Review of Systems:  Remaining ROS negative.  Physical Exam: Blood pressure 102/55, pulse 58, temperature 97.1 F (36.2 C), temperature source Oral, resp. rate 20, height 5' 9.5" (1.765 m), weight 203 lb 3.2 oz (92.171 kg). ECOG: 1 General appearance: alert Head: Normocephalic, without obvious abnormality, atraumatic Neck: no adenopathy, no carotid bruit, no JVD, supple, symmetrical, trachea midline and thyroid not enlarged, symmetric, no tenderness/mass/nodules Lymph nodes: Cervical, supraclavicular, and axillary nodes normal. Heart:regular rate and rhythm, S1, S2 normal, no murmur, click, rub or gallop Lung:chest clear, no wheezing, rales, normal symmetric air entry Abdomen: soft, non-tender, without masses or organomegaly EXT:no erythema, induration, or nodules.  1 + edema.    Lab Results: Lab Results  Component Value Date   WBC 6.9 05/23/2013   HGB 11.0* 05/23/2013   HCT 32.5* 05/23/2013   MCV 85.8 05/23/2013   PLT 112* 05/23/2013     Chemistry      Component Value Date/Time   NA 140 05/09/2013 0921   NA 139 04/04/2013 0538   K 3.7 05/09/2013 0921   K 3.6 04/04/2013 0538   CL 103 04/04/2013 0538   CL 105 01/10/2013 0831   CO2 27 05/09/2013 0921   CO2 27 04/04/2013 0538   BUN 33.7* 05/09/2013 0921   BUN 17 04/04/2013 0538   CREATININE 1.3 05/09/2013 0921   CREATININE 0.94 04/04/2013 0538   CREATININE 0.76 06/27/2012 0856      Component Value Date/Time   CALCIUM 9.1 05/09/2013 0921   CALCIUM 9.1 04/04/2013 0538   ALKPHOS 42 05/09/2013 0921   ALKPHOS 47 04/01/2013 1350   AST 18 05/09/2013 0921   AST 25 04/01/2013 1350   ALT 11  05/09/2013 0921   ALT 16 04/01/2013 1350   BILITOT 0.57 05/09/2013 0921   BILITOT 0.9 04/01/2013 1350     Impression and Plan:  79 year old man with :  1. Stage IV colon cancer. He is S/P sigmoid colon resection with a bulky tumor in the liver. On palliative chemotherapy with FOLFIRI/Avastin. He is S/P cycle 8 cycles. CT scan results from 7/14 (after 8 cycles) showed slight decline in tumor burden.  He is status post reduced dose FOLFIRI and tolerated it well the plan is to proceed with another treatment today and every 2 weeks. His laboratory data and physical exam is not prohibitive at this time. I will obtain a CT scan before the next treatment. I will also reduce his CPT-11 dose to a total of 25% of the original dose. The dose reduction is due to GIM constitutional symptoms.   2. Port management. Port in place.   3. Congestion/cough and shortness of breath: mostly due to CHF. This has improved dramatically at this time.  4. Epistaxis: improved off Avastin.   5. Diarrhea. Due to Irinotecan. Recommend that he continue Imodium and Lomotil as needed.   6. Follow up: on 10/12 for the next chemotherapy.    Mikhai Bienvenue 10/29/20149:12 AM

## 2013-05-23 NOTE — Progress Notes (Signed)
Chaplain spoke with patient's wife in lobby. Wife was very friendly and talkative. She chatted for a while about her upbringing in [place], but then also spoke about how her husband, the patient, is doing. She stated that he is doing "okay," but is very tired. She said it is "humbling to be here," because so many patients have it so much worse. Patient's wife stated that patient had been lucky not to have nausea or some of the other side effects. She did state that she is his primary caregiver and does not get much sleep as she has to have "one ear always listening." Patient's wife described receiving the initial diagnosis of stage four colon cancer. She stated that they are out of the shock phase now and that he is doing pretty well. She said that the cancer has been eliminated from his color but has metastasized elsewhere. Pt's wife stated that they have been married for 47 years and their goal is to make it to 55. She talked about how she and Demaurion met and how she's lived here for 50 years since moving from [place]. She also mentioned that they have good support in their friends and nearby family.

## 2013-05-23 NOTE — Patient Instructions (Signed)
[facility] Discharge Instructions for Patients Receiving Chemotherapy  Today you received the following chemotherapy agents: Camptosar, Leucovorin and Fluorouracil.  To help prevent nausea and vomiting after your treatment, we encourage you to take your nausea medication, Prochlorperazine. Take one every six hours as needed. If you develop nausea and vomiting that is not controlled by your nausea medication, call the clinic.   BELOW ARE SYMPTOMS THAT SHOULD BE REPORTED IMMEDIATELY:  *FEVER GREATER THAN 100.5 F  *CHILLS WITH OR WITHOUT FEVER  NAUSEA AND VOMITING THAT IS NOT CONTROLLED WITH YOUR NAUSEA MEDICATION  *UNUSUAL SHORTNESS OF BREATH  *UNUSUAL BRUISING OR BLEEDING  TENDERNESS IN MOUTH AND THROAT WITH OR WITHOUT PRESENCE OF ULCERS  *URINARY PROBLEMS  *BOWEL PROBLEMS  UNUSUAL RASH Items with * indicate a potential emergency and should be followed up as soon as possible.  Feel free to call the clinic should you have any questions or concerns. The clinic phone number is [phone].

## 2013-05-25 ENCOUNTER — Ambulatory Visit (HOSPITAL_BASED_OUTPATIENT_CLINIC_OR_DEPARTMENT_OTHER): Payer: Medicare Other

## 2013-05-25 VITALS — BP 109/56 | HR 57 | Temp 98.0°F | Resp 16

## 2013-05-25 DIAGNOSIS — C187 Malignant neoplasm of sigmoid colon: Secondary | ICD-10-CM

## 2013-05-25 DIAGNOSIS — Z452 Encounter for adjustment and management of vascular access device: Secondary | ICD-10-CM

## 2013-05-25 DIAGNOSIS — C787 Secondary malignant neoplasm of liver and intrahepatic bile duct: Secondary | ICD-10-CM

## 2013-05-25 DIAGNOSIS — C189 Malignant neoplasm of colon, unspecified: Secondary | ICD-10-CM

## 2013-05-25 MED ORDER — SODIUM CHLORIDE 0.9 % IJ SOLN
10.0000 mL | INTRAMUSCULAR | Status: DC | PRN
  Administered 2013-05-25: 10 mL
  Filled 2013-05-25: qty 10

## 2013-05-25 MED ORDER — HEPARIN SOD (PORK) LOCK FLUSH 100 UNIT/ML IV SOLN
500.0000 [IU] | Freq: Once | INTRAVENOUS | Status: AC | PRN
  Administered 2013-05-25: 500 [IU]
  Filled 2013-05-25: qty 5

## 2013-05-31 ENCOUNTER — Other Ambulatory Visit: Payer: Self-pay

## 2013-05-31 ENCOUNTER — Telehealth: Payer: Self-pay | Admitting: *Deleted

## 2013-05-31 NOTE — Telephone Encounter (Signed)
Chl Mychart After Visit Questionnaire    Question 05/31/2013 7:49 AM   How are you feeling after your recent visit? ok   Does the recommended course of treatment seem to be helping your symptoms? yes   Are you experiencing any side effects from your recommended treatment? yes   is there anything else you would like to ask your physician? no   Call to pt r/t SEs from Tx.  Verified x 2.  Pt stated SEs were "nothing you're going to be able to help me with."  Pt informed f/u call is made when there is an abnormal on the questionnaire.  Pt stated SE is "just a little more bleeding, nothing major."  Pt stated he has had bleeding from chemo and cannot attribute one to the other.  Stated he just got out of the shower and is feeling fine.  No further action taken.

## 2013-06-05 ENCOUNTER — Ambulatory Visit (HOSPITAL_COMMUNITY)
Admission: RE | Admit: 2013-06-05 | Discharge: 2013-06-05 | Disposition: A | Discharge status: Home / Self Care | Payer: Medicare Other | Source: Ambulatory Visit | Attending: Oncology | Admitting: Oncology

## 2013-06-05 ENCOUNTER — Encounter (HOSPITAL_COMMUNITY): Payer: Self-pay

## 2013-06-05 DIAGNOSIS — I700 Atherosclerosis of aorta: Secondary | ICD-10-CM | POA: Insufficient documentation

## 2013-06-05 DIAGNOSIS — C189 Malignant neoplasm of colon, unspecified: Secondary | ICD-10-CM | POA: Insufficient documentation

## 2013-06-05 DIAGNOSIS — N281 Cyst of kidney, acquired: Secondary | ICD-10-CM | POA: Insufficient documentation

## 2013-06-05 DIAGNOSIS — C787 Secondary malignant neoplasm of liver and intrahepatic bile duct: Secondary | ICD-10-CM | POA: Insufficient documentation

## 2013-06-05 DIAGNOSIS — K4090 Unilateral inguinal hernia, without obstruction or gangrene, not specified as recurrent: Secondary | ICD-10-CM | POA: Insufficient documentation

## 2013-06-05 DIAGNOSIS — K439 Ventral hernia without obstruction or gangrene: Secondary | ICD-10-CM | POA: Insufficient documentation

## 2013-06-05 DIAGNOSIS — C7970 Secondary malignant neoplasm of unspecified adrenal gland: Secondary | ICD-10-CM | POA: Insufficient documentation

## 2013-06-05 DIAGNOSIS — Z79899 Other long term (current) drug therapy: Secondary | ICD-10-CM | POA: Insufficient documentation

## 2013-06-05 MED ORDER — IOHEXOL 300 MG/ML  SOLN
100.0000 mL | Freq: Once | INTRAMUSCULAR | Status: AC | PRN
  Administered 2013-06-05: 100 mL via INTRAVENOUS

## 2013-06-06 ENCOUNTER — Telehealth: Payer: Self-pay | Admitting: Oncology

## 2013-06-06 ENCOUNTER — Ambulatory Visit (HOSPITAL_BASED_OUTPATIENT_CLINIC_OR_DEPARTMENT_OTHER): Payer: Medicare Other | Admitting: Oncology

## 2013-06-06 ENCOUNTER — Ambulatory Visit (HOSPITAL_BASED_OUTPATIENT_CLINIC_OR_DEPARTMENT_OTHER): Payer: Medicare Other

## 2013-06-06 ENCOUNTER — Other Ambulatory Visit (HOSPITAL_BASED_OUTPATIENT_CLINIC_OR_DEPARTMENT_OTHER): Payer: Medicare Other | Admitting: Lab

## 2013-06-06 ENCOUNTER — Telehealth: Payer: Self-pay | Admitting: *Deleted

## 2013-06-06 ENCOUNTER — Inpatient Hospital Stay: Payer: Medicare Other

## 2013-06-06 VITALS — BP 117/58 | HR 56 | Temp 97.7°F | Resp 18 | Ht 69.5 in | Wt 203.3 lb

## 2013-06-06 DIAGNOSIS — C787 Secondary malignant neoplasm of liver and intrahepatic bile duct: Secondary | ICD-10-CM

## 2013-06-06 DIAGNOSIS — Z5111 Encounter for antineoplastic chemotherapy: Secondary | ICD-10-CM

## 2013-06-06 DIAGNOSIS — C187 Malignant neoplasm of sigmoid colon: Secondary | ICD-10-CM

## 2013-06-06 DIAGNOSIS — C189 Malignant neoplasm of colon, unspecified: Secondary | ICD-10-CM

## 2013-06-06 DIAGNOSIS — R197 Diarrhea, unspecified: Secondary | ICD-10-CM

## 2013-06-06 DIAGNOSIS — I509 Heart failure, unspecified: Secondary | ICD-10-CM

## 2013-06-06 LAB — COMPREHENSIVE METABOLIC PANEL (CC13)
AST: 22 U/L (ref 5–34)
Albumin: 3.4 g/dL — ABNORMAL LOW (ref 3.5–5.0)
Alkaline Phosphatase: 44 U/L (ref 40–150)
CO2: 26 mEq/L (ref 22–29)
Potassium: 4 mEq/L (ref 3.5–5.1)
Sodium: 138 mEq/L (ref 136–145)
Total Protein: 6.5 g/dL (ref 6.4–8.3)

## 2013-06-06 LAB — CBC WITH DIFFERENTIAL/PLATELET
Basophils Absolute: 0.1 10*3/uL (ref 0.0–0.1)
EOS%: 5.8 % (ref 0.0–7.0)
Eosinophils Absolute: 0.3 10*3/uL (ref 0.0–0.5)
HCT: 32.6 % — ABNORMAL LOW (ref 38.4–49.9)
HGB: 10.7 g/dL — ABNORMAL LOW (ref 13.0–17.1)
MCH: 28.6 pg (ref 27.2–33.4)
MCV: 87.2 fL (ref 79.3–98.0)
MONO#: 0.5 10*3/uL (ref 0.1–0.9)
NEUT#: 3.8 10*3/uL (ref 1.5–6.5)
NEUT%: 64 % (ref 39.0–75.0)
RBC: 3.74 10*6/uL — ABNORMAL LOW (ref 4.20–5.82)
RDW: 17.3 % — ABNORMAL HIGH (ref 11.0–14.6)
lymph#: 1.2 10*3/uL (ref 0.9–3.3)
nRBC: 0 % (ref 0–0)

## 2013-06-06 MED ORDER — ONDANSETRON 16 MG/50ML IVPB (CHCC)
16.0000 mg | Freq: Once | INTRAVENOUS | Status: AC
  Administered 2013-06-06: 16 mg via INTRAVENOUS

## 2013-06-06 MED ORDER — FLUOROURACIL CHEMO INJECTION 2.5 GM/50ML
300.0000 mg/m2 | Freq: Once | INTRAVENOUS | Status: AC
  Administered 2013-06-06: 650 mg via INTRAVENOUS
  Filled 2013-06-06: qty 13

## 2013-06-06 MED ORDER — DEXAMETHASONE SODIUM PHOSPHATE 20 MG/5ML IJ SOLN
INTRAMUSCULAR | Status: AC
  Filled 2013-06-06: qty 5

## 2013-06-06 MED ORDER — IRINOTECAN HCL CHEMO INJECTION 100 MG/5ML
45.0000 mg/m2 | Freq: Once | INTRAVENOUS | Status: AC
  Administered 2013-06-06: 100 mg via INTRAVENOUS
  Filled 2013-06-06: qty 5

## 2013-06-06 MED ORDER — SODIUM CHLORIDE 0.9 % IV SOLN
1800.0000 mg/m2 | INTRAVENOUS | Status: DC
  Administered 2013-06-06: 3900 mg via INTRAVENOUS
  Filled 2013-06-06: qty 78

## 2013-06-06 MED ORDER — ONDANSETRON 16 MG/50ML IVPB (CHCC)
INTRAVENOUS | Status: AC
  Filled 2013-06-06: qty 16

## 2013-06-06 MED ORDER — LEUCOVORIN CALCIUM INJECTION 350 MG
650.0000 mg | Freq: Once | INTRAVENOUS | Status: AC
  Administered 2013-06-06: 650 mg via INTRAVENOUS
  Filled 2013-06-06: qty 32.5

## 2013-06-06 MED ORDER — SODIUM CHLORIDE 0.9 % IV SOLN
Freq: Once | INTRAVENOUS | Status: AC
  Administered 2013-06-06: 10:00:00 via INTRAVENOUS

## 2013-06-06 MED ORDER — DEXAMETHASONE SODIUM PHOSPHATE 20 MG/5ML IJ SOLN
20.0000 mg | Freq: Once | INTRAMUSCULAR | Status: AC
Start: 2013-06-06 — End: 2013-06-06
  Administered 2013-06-06: 20 mg via INTRAVENOUS

## 2013-06-06 NOTE — Progress Notes (Signed)
Hematology and Oncology Follow Up Visit  Todd [name] [id number] 15-Jun-1930 79 y.o. 06/06/2013 9:04 AM [name], MDBreen, [name], MD   Principle Diagnosis: 79 year old with stage IV colon cancer diagnosed in 08/2012. He presented with a colonic mass and liver lesions.  KRAS wild type.   Prior Therapy: He is S/P LAPAROSCOPIC ASSISTED SIGMOID COLON RESECTION, END Left OSTOMY, Hartmann's pouch and acement of power port done on 09/07/2012.   Current therapy:  He started FOLFIRI and Avastin on 3/26. Avastin was stopped after 4 cycles due to epistaxis. He is S/P 8 cycles of treatment last one given on 01/24/2013. He has been on a treatment break for the last 3 months.  FOLFIRI restarted on 10/1 with reduced doses (60% of the CPT 11).  He is here before the 5th cycle of FOLFIRI restart.   Interim History: Todd [name] presents today for a follow up visit. He is a very nice man with stage IV colon cancer. He under went surgical resection on 09/07/2012 and have done well chemotherapy in general, but after 8 cycles of chemotherapy he has been on a treatment break.  He was started on FOLFIRI at a reduced dose and received the 4th cycle of that without any complications. Has not reported any nausea or vomiting. He has reported more diarrhea and overall weakness. He reported grade 2 fatigue but otherwise no other complications. He still able to perform most activities of daily living without any hindrance or decline. His lower extremity edema has improved transiently but now it appears to have increased somewhat in the last few days. His overall performance status and activity level continue to improve.  Medications: I have reviewed the patient's current medications.  Current Outpatient Prescriptions  Medication Sig Dispense Refill  . aspirin 81 MG tablet Take 81 mg by mouth daily.        . benazepril (LOTENSIN) 10 MG tablet Take 1 tablet (10 mg total) by mouth daily.  90 tablet  3  . chlorhexidine  (PERIDEX) 0.12 % solution Use as directed 15 mLs in the mouth or throat 2 (two) times daily. Use as directed for mouth irritation      . diclofenac (VOLTAREN) 75 MG EC tablet Take 1 tablet (75 mg total) by mouth 2 (two) times daily as needed.  180 tablet  0  . diclofenac sodium (VOLTAREN) 1 % GEL Apply 4 g topically 4 (four) times daily.  100 g  2  . diphenoxylate-atropine (LOMOTIL) 2.5-0.025 MG per tablet Take 2 tablets by mouth 4 (four) times daily as needed for diarrhea or loose stools.  60 tablet  1  . doxazosin (CARDURA) 8 MG tablet Take 4 mg by mouth 2 (two) times daily.       Marland Kitchen gabapentin (NEURONTIN) 300 MG capsule Take 300 mg by mouth as needed.      . hydrochlorothiazide (HYDRODIURIL) 25 MG tablet TAKE 1 TABLET BY MOUTH EVERY DAY  90 tablet  3  . isosorbide mononitrate (IMDUR) 60 MG 24 hr tablet TAKE 1 TABLET BY MOUTH EVERY DAY  90 tablet  3  . lidocaine-prilocaine (EMLA) cream Apply topically as needed. Prior to chemotherapy treatment, Apply approx 1/2 teaspoon to skin over port, do not rub in. Cover with plastic,  30 g  1  . metoprolol succinate (TOPROL-XL) 50 MG 24 hr tablet Take 50 mg by mouth daily. Take with or immediately following a meal.      . Multiple Vitamin (MULTIVITAMIN) tablet Take 1 tablet  by mouth daily.        . mupirocin cream (BACTROBAN) 2 % Apply topically as needed.      . nitroGLYCERIN (NITROSTAT) 0.4 MG SL tablet Place 0.4 mg under the tongue every 5 (five) minutes as needed.        . Nutritional Supplements (ENSURE PO) Take 1 Can by mouth daily.      Bertram Gala Glycol-Propyl Glycol (SYSTANE OP) Place 1 drop into both eyes daily.       . prochlorperazine (COMPAZINE) 10 MG tablet Take 1 tablet (10 mg total) by mouth every 6 (six) hours as needed.  30 tablet  1  . torsemide (DEMADEX) 20 MG tablet Take 1 tablet (20 mg total) by mouth every evening.  90 tablet  3  . VITAMIN E PO Take 1 capsule by mouth at bedtime.      [name] 20 MG TABS tablet TAKE 1 TABLET BY MOUTH  DAILY  30 tablet  0  . simvastatin (ZOCOR) 40 MG tablet Take 20 mg by mouth every morning.      . sodium chloride (OCEAN) 0.65 % SOLN nasal spray Place 1 spray into the nose as needed for congestion.  1 Bottle  0   No current facility-administered medications for this visit.    Allergies: No Known Allergies  Past Medical History, Surgical history, Social history, and Family History were reviewed and updated.  Review of Systems:  Remaining ROS negative.  Physical Exam: Blood pressure 117/58, pulse 56, temperature 97.7 F (36.5 C), temperature source Oral, resp. rate 18, height 5' 9.5" (1.765 m), weight 203 lb 4.8 oz (92.216 kg). ECOG: 1 General appearance: alert Head: Normocephalic, without obvious abnormality, atraumatic Neck: no adenopathy, no carotid bruit, no JVD, supple, symmetrical, trachea midline and thyroid not enlarged, symmetric, no tenderness/mass/nodules Lymph nodes: Cervical, supraclavicular, and axillary nodes normal. Heart:regular rate and rhythm, S1, S2 normal, no murmur, click, rub or gallop Lung:chest clear, no wheezing, rales, normal symmetric air entry Abdomen: soft, non-tender, without masses or organomegaly EXT:no erythema, induration, or nodules.  1 + edema.    Lab Results: Lab Results  Component Value Date   WBC 5.9 06/06/2013   HGB 10.7* 06/06/2013   HCT 32.6* 06/06/2013   MCV 87.2 06/06/2013   PLT 144 06/06/2013     Chemistry      Component Value Date/Time   NA 139 05/23/2013 0831   NA 139 04/04/2013 0538   K 4.1 05/23/2013 0831   K 3.6 04/04/2013 0538   CL 103 04/04/2013 0538   CL 105 01/10/2013 0831   CO2 24 05/23/2013 0831   CO2 27 04/04/2013 0538   BUN 34.0* 05/23/2013 0831   BUN 17 04/04/2013 0538   CREATININE 1.4* 05/23/2013 0831   CREATININE 0.94 04/04/2013 0538   CREATININE 0.76 06/27/2012 0856      Component Value Date/Time   CALCIUM 9.2 05/23/2013 0831   CALCIUM 9.1 04/04/2013 0538   ALKPHOS 41 05/23/2013 0831   ALKPHOS 47 04/01/2013  1350   AST 23 05/23/2013 0831   AST 25 04/01/2013 1350   ALT 12 05/23/2013 0831   ALT 16 04/01/2013 1350   BILITOT 0.86 05/23/2013 0831   BILITOT 0.9 04/01/2013 1350     EXAM:  CT ABDOMEN AND PELVIS WITH CONTRAST  TECHNIQUE:  Multidetector CT imaging of the abdomen and pelvis was performed  using the standard protocol following bolus administration of  intravenous contrast.  CONTRAST: OMNIPAQUE IOHEXOL 300 MG/ML SOLN  COMPARISON:  02/05/2013  FINDINGS:  Stable lingular and left lower lobe consolidation with areas of air  bronchogram formation and nodularity particularly at the left lung  base abutting the diaphragm. No new pulmonary nodule or mass.  Multiple irregular hypodense hepatic mass is are reidentified,  including a dominant mass at the dome of the right hepatic lobe  measuring 8.5 x 6.7 cm image 16, previously 9.2 x 7.1 cm. Mass at  the dome of the right hepatic lobe more inferiorly with overlying  capsular retraction is also slightly smaller, now 4.2 x 3.7 cm image  16 compared to 5.4 x 4.0 cm on the prior exam.  Right adrenal mass is smaller, now 2.2 cm image 20 compared to 2.5  cm previously. Left adrenal gland is normal. Pancreas, gallbladder,  spleen are unremarkable. Bilateral renal cortical cyst are  reidentified, including a dominant left lower renal pole cyst with  probable thin hairline internal septation image 39. Haziness within  the periaortic retroperitoneum with small lymph nodes appears  subjectively stable. No measurable conglomerate nodal mass.  Small fat containing left inguinal hernia reidentified. Marked  prostatomegaly, measuring 7.7 cm on image 77. Bladder is  decompressed but unremarkable. Moderate atheromatous aortic  calcification without aneurysm. The appendix is not identified but  there is no secondary evidence for acute appendicitis. Left lower  quadrant ostomy in place. Evidence of left hemicolectomy  reidentified without mass lesion at  the resection margins. No pelvic  free fluid or lymphadenopathy. Small fat-containing and  nonobstructed small bowel-containing ventral abdominal wall hernia  image 50 measuring 2.7 cm in width.  Right hip and spine degenerative change again noted. No new lytic or  sclerotic osseous lesion.  IMPRESSION:  Interval decrease in size of dominant hepatic and right adrenal  metastases. No new evidence for metastatic disease elsewhere in the  abdomen or pelvis.  Stable probable scarring at the left lung base, amenable to followup  at future exams.      Impression and Plan:  79 year old man with :  1. Stage IV colon cancer. He is S/P sigmoid colon resection with a bulky tumor in the liver. On palliative chemotherapy with FOLFIRI/Avastin. He is S/P cycle 8 cycles. CT scan results from 7/14 (after 8 cycles) showed slight decline in tumor burden.  After 4 cycles of FOLFIRI restart at a lower dose, his CT scan on 06/05/2013 was discussed today. He is continued to show benefit from chemotherapy both clinically and radiographically. The plan is to continue with the current dose and schedule at least for the time being. I plan to repeat another CT scan in 2 months.   2. Port management. Port in place.   3. Congestion/cough and shortness of breath: mostly due to CHF. This has improved dramatically at this time.  4. Epistaxis: improved off Avastin.   5. Diarrhea. Due to Irinotecan. Recommend that he continue Imodium and Lomotil as needed.   6. Follow up: on 11/26 for the next chemotherapy.    SHADAD,FIRAS 11/12/20149:04 AM

## 2013-06-06 NOTE — Patient Instructions (Signed)
[facility] Discharge Instructions for Patients Receiving Chemotherapy  Today you received the following chemotherapy agents Irinotecan, Lecuvorin and Adrucil  To help prevent nausea and vomiting after your treatment, we encourage you to take your nausea medication.   If you develop nausea and vomiting that is not controlled by your nausea medication, call the clinic.   BELOW ARE SYMPTOMS THAT SHOULD BE REPORTED IMMEDIATELY:  *FEVER GREATER THAN 100.5 F  *CHILLS WITH OR WITHOUT FEVER  NAUSEA AND VOMITING THAT IS NOT CONTROLLED WITH YOUR NAUSEA MEDICATION  *UNUSUAL SHORTNESS OF BREATH  *UNUSUAL BRUISING OR BLEEDING  TENDERNESS IN MOUTH AND THROAT WITH OR WITHOUT PRESENCE OF ULCERS  *URINARY PROBLEMS  *BOWEL PROBLEMS  UNUSUAL RASH Items with * indicate a potential emergency and should be followed up as soon as possible.  Feel free to call the clinic you have any questions or concerns. The clinic phone number is [phone].

## 2013-06-06 NOTE — Telephone Encounter (Signed)
Per staff message and POF I have scheduled appts.  JMW  

## 2013-06-06 NOTE — Telephone Encounter (Signed)
Talked to pt's wife, gave her appt for November and December , they will come and get appt calendar on Friday

## 2013-06-06 NOTE — Telephone Encounter (Signed)
Gave pt appt for lab, and MD for November and december , emailed [place] regarding chemo

## 2013-06-08 ENCOUNTER — Ambulatory Visit (HOSPITAL_BASED_OUTPATIENT_CLINIC_OR_DEPARTMENT_OTHER): Payer: Medicare Other

## 2013-06-08 VITALS — BP 103/48 | HR 54 | Temp 98.3°F | Resp 18

## 2013-06-08 DIAGNOSIS — C187 Malignant neoplasm of sigmoid colon: Secondary | ICD-10-CM

## 2013-06-08 DIAGNOSIS — C787 Secondary malignant neoplasm of liver and intrahepatic bile duct: Secondary | ICD-10-CM

## 2013-06-08 DIAGNOSIS — C189 Malignant neoplasm of colon, unspecified: Secondary | ICD-10-CM

## 2013-06-08 MED ORDER — HEPARIN SOD (PORK) LOCK FLUSH 100 UNIT/ML IV SOLN
500.0000 [IU] | Freq: Once | INTRAVENOUS | Status: AC | PRN
  Administered 2013-06-08: 500 [IU]
  Filled 2013-06-08: qty 5

## 2013-06-08 MED ORDER — SODIUM CHLORIDE 0.9 % IJ SOLN
10.0000 mL | INTRAMUSCULAR | Status: DC | PRN
  Administered 2013-06-08: 10 mL
  Filled 2013-06-08: qty 10

## 2013-06-19 ENCOUNTER — Other Ambulatory Visit (HOSPITAL_COMMUNITY): Payer: Self-pay | Admitting: Family Medicine

## 2013-06-20 ENCOUNTER — Other Ambulatory Visit (HOSPITAL_BASED_OUTPATIENT_CLINIC_OR_DEPARTMENT_OTHER): Payer: Medicare Other | Admitting: Lab

## 2013-06-20 ENCOUNTER — Ambulatory Visit (HOSPITAL_BASED_OUTPATIENT_CLINIC_OR_DEPARTMENT_OTHER): Payer: Medicare Other | Admitting: Oncology

## 2013-06-20 ENCOUNTER — Telehealth: Payer: Self-pay | Admitting: Oncology

## 2013-06-20 ENCOUNTER — Ambulatory Visit (HOSPITAL_BASED_OUTPATIENT_CLINIC_OR_DEPARTMENT_OTHER): Payer: Medicare Other

## 2013-06-20 VITALS — BP 110/41 | HR 59 | Temp 97.5°F | Resp 18 | Ht 69.5 in | Wt 205.2 lb

## 2013-06-20 DIAGNOSIS — I509 Heart failure, unspecified: Secondary | ICD-10-CM

## 2013-06-20 DIAGNOSIS — C187 Malignant neoplasm of sigmoid colon: Secondary | ICD-10-CM

## 2013-06-20 DIAGNOSIS — Z5111 Encounter for antineoplastic chemotherapy: Secondary | ICD-10-CM

## 2013-06-20 DIAGNOSIS — C189 Malignant neoplasm of colon, unspecified: Secondary | ICD-10-CM

## 2013-06-20 DIAGNOSIS — R197 Diarrhea, unspecified: Secondary | ICD-10-CM

## 2013-06-20 DIAGNOSIS — C787 Secondary malignant neoplasm of liver and intrahepatic bile duct: Secondary | ICD-10-CM

## 2013-06-20 LAB — CBC WITH DIFFERENTIAL/PLATELET
EOS%: 4.3 % (ref 0.0–7.0)
Eosinophils Absolute: 0.3 10*3/uL (ref 0.0–0.5)
LYMPH%: 16.1 % (ref 14.0–49.0)
MCH: 29 pg (ref 27.2–33.4)
MCHC: 32.8 g/dL (ref 32.0–36.0)
MCV: 88.4 fL (ref 79.3–98.0)
MONO%: 8.7 % (ref 0.0–14.0)
NEUT#: 5 10*3/uL (ref 1.5–6.5)
RBC: 3.79 10*6/uL — ABNORMAL LOW (ref 4.20–5.82)
RDW: 19 % — ABNORMAL HIGH (ref 11.0–14.6)
lymph#: 1.1 10*3/uL (ref 0.9–3.3)

## 2013-06-20 LAB — COMPREHENSIVE METABOLIC PANEL (CC13)
AST: 27 U/L (ref 5–34)
Albumin: 3.2 g/dL — ABNORMAL LOW (ref 3.5–5.0)
Alkaline Phosphatase: 48 U/L (ref 40–150)
CO2: 25 mEq/L (ref 22–29)
Creatinine: 1.1 mg/dL (ref 0.7–1.3)
Glucose: 127 mg/dl (ref 70–140)
Potassium: 3.9 mEq/L (ref 3.5–5.1)
Sodium: 142 mEq/L (ref 136–145)
Total Bilirubin: 0.69 mg/dL (ref 0.20–1.20)
Total Protein: 6.5 g/dL (ref 6.4–8.3)

## 2013-06-20 MED ORDER — ATROPINE SULFATE 1 MG/ML IJ SOLN
INTRAMUSCULAR | Status: AC
  Filled 2013-06-20: qty 1

## 2013-06-20 MED ORDER — IRINOTECAN HCL CHEMO INJECTION 100 MG/5ML
45.0000 mg/m2 | Freq: Once | INTRAVENOUS | Status: AC
  Administered 2013-06-20: 100 mg via INTRAVENOUS
  Filled 2013-06-20: qty 5

## 2013-06-20 MED ORDER — LEUCOVORIN CALCIUM INJECTION 350 MG
650.0000 mg | Freq: Once | INTRAVENOUS | Status: AC
  Administered 2013-06-20: 650 mg via INTRAVENOUS
  Filled 2013-06-20: qty 32.5

## 2013-06-20 MED ORDER — ONDANSETRON 16 MG/50ML IVPB (CHCC)
16.0000 mg | Freq: Once | INTRAVENOUS | Status: AC
  Administered 2013-06-20: 16 mg via INTRAVENOUS

## 2013-06-20 MED ORDER — DEXAMETHASONE SODIUM PHOSPHATE 20 MG/5ML IJ SOLN
INTRAMUSCULAR | Status: AC
  Filled 2013-06-20: qty 5

## 2013-06-20 MED ORDER — ATROPINE SULFATE 1 MG/ML IJ SOLN
0.5000 mg | Freq: Once | INTRAMUSCULAR | Status: AC | PRN
  Administered 2013-06-20: 0.5 mg via INTRAVENOUS

## 2013-06-20 MED ORDER — ONDANSETRON 16 MG/50ML IVPB (CHCC)
INTRAVENOUS | Status: AC
  Filled 2013-06-20: qty 16

## 2013-06-20 MED ORDER — SODIUM CHLORIDE 0.9 % IV SOLN
Freq: Once | INTRAVENOUS | Status: AC
  Administered 2013-06-20: 09:00:00 via INTRAVENOUS

## 2013-06-20 MED ORDER — FLUOROURACIL CHEMO INJECTION 2.5 GM/50ML
300.0000 mg/m2 | Freq: Once | INTRAVENOUS | Status: AC
  Administered 2013-06-20: 650 mg via INTRAVENOUS
  Filled 2013-06-20: qty 13

## 2013-06-20 MED ORDER — DEXAMETHASONE SODIUM PHOSPHATE 20 MG/5ML IJ SOLN
20.0000 mg | Freq: Once | INTRAMUSCULAR | Status: AC
  Administered 2013-06-20: 20 mg via INTRAVENOUS

## 2013-06-20 MED ORDER — SODIUM CHLORIDE 0.9 % IV SOLN
1800.0000 mg/m2 | INTRAVENOUS | Status: DC
  Administered 2013-06-20: 3900 mg via INTRAVENOUS
  Filled 2013-06-20: qty 78

## 2013-06-20 NOTE — Patient Instructions (Signed)
[facility] Discharge Instructions for Patients Receiving Chemotherapy  Today you received the following chemotherapy agents: Irinotecan, Leucovorin, 5FU   To help prevent nausea and vomiting after your treatment, we encourage you to take your nausea medication as prescribed.    If you develop nausea and vomiting that is not controlled by your nausea medication, call the clinic.   BELOW ARE SYMPTOMS THAT SHOULD BE REPORTED IMMEDIATELY:  *FEVER GREATER THAN 100.5 F  *CHILLS WITH OR WITHOUT FEVER  NAUSEA AND VOMITING THAT IS NOT CONTROLLED WITH YOUR NAUSEA MEDICATION  *UNUSUAL SHORTNESS OF BREATH  *UNUSUAL BRUISING OR BLEEDING  TENDERNESS IN MOUTH AND THROAT WITH OR WITHOUT PRESENCE OF ULCERS  *URINARY PROBLEMS  *BOWEL PROBLEMS  UNUSUAL RASH Items with * indicate a potential emergency and should be followed up as soon as possible.  Feel free to call the clinic you have any questions or concerns. The clinic phone number is [phone].    

## 2013-06-20 NOTE — Progress Notes (Signed)
Hematology and Oncology Follow Up Visit  Todd [name] [id number] [birth year] 79 y.o. 06/20/2013 9:01 AM [name], MDBreen, [name], MD   Principle Diagnosis: 79 year old with stage IV colon cancer diagnosed in 08/2012. He presented with a colonic mass and liver lesions.  KRAS wild type.   Prior Therapy: He is S/P LAPAROSCOPIC ASSISTED SIGMOID COLON RESECTION, END Left OSTOMY, Hartmann's pouch and acement of power port done on 09/07/2012.   Current therapy:  He started FOLFIRI and Avastin on 3/26. Avastin was stopped after 4 cycles due to epistaxis. He is S/P 8 cycles of treatment last one given on 01/24/2013. He has been on a treatment break for the last 3 months.  FOLFIRI restarted on 10/1 with reduced doses (60% of the CPT 11).  He is here before the 6th cycle of FOLFIRI restart.   Interim History: [name] presents today for a follow up visit. He is a very nice man with stage IV colon cancer. He under went surgical resection on 09/07/2012 and have done well chemotherapy in general, but after 8 cycles of chemotherapy he has been on a treatment break.  He was started on FOLFIRI at a reduced dose and received the 5th cycle of that without any complications. Has not reported any nausea or vomiting. He has reported more diarrhea and overall weakness. He reported grade 2 fatigue but otherwise no other complications. He still able to perform most activities of daily living without any hindrance or decline. His lower extremity edema has improved. His overall performance status and activity level continue to improve. Still reports mild epistaxis as well as mild constipation.  Medications: I have reviewed the patient's current medications.  Current Outpatient Prescriptions  Medication Sig Dispense Refill  . aspirin 81 MG tablet Take 81 mg by mouth daily.        . benazepril (LOTENSIN) 10 MG tablet Take 1 tablet (10 mg total) by mouth daily.  90 tablet  3  . chlorhexidine (PERIDEX) 0.12 % solution  Use as directed 15 mLs in the mouth or throat 2 (two) times daily. Use as directed for mouth irritation      . diclofenac (VOLTAREN) 75 MG EC tablet Take 1 tablet (75 mg total) by mouth 2 (two) times daily as needed.  180 tablet  0  . diclofenac sodium (VOLTAREN) 1 % GEL Apply 4 g topically 4 (four) times daily.  100 g  2  . diphenoxylate-atropine (LOMOTIL) 2.5-0.025 MG per tablet Take 2 tablets by mouth 4 (four) times daily as needed for diarrhea or loose stools.  60 tablet  1  . doxazosin (CARDURA) 8 MG tablet Take 4 mg by mouth 2 (two) times daily.       Marland Kitchen gabapentin (NEURONTIN) 300 MG capsule Take 300 mg by mouth as needed.      . hydrochlorothiazide (HYDRODIURIL) 25 MG tablet TAKE 1 TABLET BY MOUTH EVERY DAY  90 tablet  3  . isosorbide mononitrate (IMDUR) 60 MG 24 hr tablet TAKE 1 TABLET BY MOUTH EVERY DAY  90 tablet  3  . lidocaine-prilocaine (EMLA) cream Apply topically as needed. Prior to chemotherapy treatment, Apply approx 1/2 teaspoon to skin over port, do not rub in. Cover with plastic,  30 g  1  . metoprolol succinate (TOPROL-XL) 50 MG 24 hr tablet Take 50 mg by mouth daily. Take with or immediately following a meal.      . Multiple Vitamin (MULTIVITAMIN) tablet Take 1 tablet by mouth daily.        Marland Kitchen  mupirocin cream (BACTROBAN) 2 % Apply topically as needed.      . nitroGLYCERIN (NITROSTAT) 0.4 MG SL tablet Place 0.4 mg under the tongue every 5 (five) minutes as needed.        . Nutritional Supplements (ENSURE PO) Take 1 Can by mouth daily.      Bertram Gala Glycol-Propyl Glycol (SYSTANE OP) Place 1 drop into both eyes daily.       . prochlorperazine (COMPAZINE) 10 MG tablet Take 1 tablet (10 mg total) by mouth every 6 (six) hours as needed.  30 tablet  1  . simvastatin (ZOCOR) 40 MG tablet Take 20 mg by mouth every morning.      . sodium chloride (OCEAN) 0.65 % SOLN nasal spray Place 1 spray into the nose as needed for congestion.  1 Bottle  0  . torsemide (DEMADEX) 20 MG tablet Take 1  tablet (20 mg total) by mouth every evening.  90 tablet  3  . VITAMIN E PO Take 1 capsule by mouth at bedtime.      [name] 20 MG TABS tablet TAKE 1 TABLET BY MOUTH DAILY  30 tablet  0   No current facility-administered medications for this visit.    Allergies: No Known Allergies  Past Medical History, Surgical history, Social history, and Family History were reviewed and updated.  Review of Systems:  Remaining ROS negative.  Physical Exam: Blood pressure 110/41, pulse 59, temperature 97.5 F (36.4 C), temperature source Oral, resp. rate 18, height 5' 9.5" (1.765 m), weight 205 lb 3.2 oz (93.078 kg). ECOG: 1 General appearance: alert Head: Normocephalic, without obvious abnormality, atraumatic Neck: no adenopathy, no carotid bruit, no JVD, supple, symmetrical, trachea midline and thyroid not enlarged, symmetric, no tenderness/mass/nodules Lymph nodes: Cervical, supraclavicular, and axillary nodes normal. Heart:regular rate and rhythm, S1, S2 normal, no murmur, click, rub or gallop Lung:chest clear, no wheezing, rales, normal symmetric air entry Abdomen: soft, non-tender, without masses or organomegaly EXT:no erythema, induration, or nodules.  1 + edema.    Lab Results: Lab Results  Component Value Date   WBC 7.0 06/20/2013   HGB 11.0* 06/20/2013   HCT 33.5* 06/20/2013   MCV 88.4 06/20/2013   PLT 122* 06/20/2013     Chemistry      Component Value Date/Time   NA 138 06/06/2013 0831   NA 139 04/04/2013 0538   K 4.0 06/06/2013 0831   K 3.6 04/04/2013 0538   CL 103 04/04/2013 0538   CL 105 01/10/2013 0831   CO2 26 06/06/2013 0831   CO2 27 04/04/2013 0538   BUN 29.7* 06/06/2013 0831   BUN 17 04/04/2013 0538   CREATININE 1.3 06/06/2013 0831   CREATININE 0.94 04/04/2013 0538   CREATININE 0.76 06/27/2012 0856      Component Value Date/Time   CALCIUM 9.2 06/06/2013 0831   CALCIUM 9.1 04/04/2013 0538   ALKPHOS 44 06/06/2013 0831   ALKPHOS 47 04/01/2013 1350   AST 22 06/06/2013  0831   AST 25 04/01/2013 1350   ALT 13 06/06/2013 0831   ALT 16 04/01/2013 1350   BILITOT 0.80 06/06/2013 0831   BILITOT 0.9 04/01/2013 1350         Impression and Plan:  79 year old man with :  1. Stage IV colon cancer. He is S/P sigmoid colon resection with a bulky tumor in the liver. On palliative chemotherapy with FOLFIRI/Avastin. He is S/P cycle 8 cycles. CT scan results from 7/14 (after 8 cycles) showed slight  decline in tumor burden.  After 4 cycles of FOLFIRI restart at a lower dose, his CT scan on 06/05/2013  continued to show benefit from chemotherapy both clinically and radiographically. The plan is to continue with the current dose and schedule at least for the time being. I plan to repeat another CT scan in 07/2013. He is ready to proceed with the next cycle of chemotherapy.   2. Port management. Port in place.   3. Congestion/cough and shortness of breath: mostly due to CHF. This has improved dramatically at this time.  4. Epistaxis: improved off Avastin.   5. Diarrhea. Due to Irinotecan. Recommend that he continue Imodium and Lomotil as needed.   6. Follow up: on 12/10 for the next chemotherapy.    Guilherme Schwenke 11/26/20149:01 AM

## 2013-06-20 NOTE — Telephone Encounter (Signed)
worked 11/26 POF SD added Tx AVS and CAl taken to pt in Tx Room shh

## 2013-06-22 ENCOUNTER — Ambulatory Visit (HOSPITAL_BASED_OUTPATIENT_CLINIC_OR_DEPARTMENT_OTHER): Payer: Medicare Other

## 2013-06-22 VITALS — BP 100/51 | HR 54 | Temp 97.0°F

## 2013-06-22 DIAGNOSIS — C187 Malignant neoplasm of sigmoid colon: Secondary | ICD-10-CM

## 2013-06-22 DIAGNOSIS — C787 Secondary malignant neoplasm of liver and intrahepatic bile duct: Secondary | ICD-10-CM

## 2013-06-22 DIAGNOSIS — C189 Malignant neoplasm of colon, unspecified: Secondary | ICD-10-CM

## 2013-06-22 MED ORDER — HEPARIN SOD (PORK) LOCK FLUSH 100 UNIT/ML IV SOLN
500.0000 [IU] | Freq: Once | INTRAVENOUS | Status: AC | PRN
  Administered 2013-06-22: 500 [IU]
  Filled 2013-06-22: qty 5

## 2013-06-22 MED ORDER — SODIUM CHLORIDE 0.9 % IJ SOLN
10.0000 mL | INTRAMUSCULAR | Status: DC | PRN
  Administered 2013-06-22: 10 mL
  Filled 2013-06-22: qty 10

## 2013-07-04 ENCOUNTER — Encounter: Payer: Self-pay | Admitting: *Deleted

## 2013-07-04 ENCOUNTER — Other Ambulatory Visit (HOSPITAL_BASED_OUTPATIENT_CLINIC_OR_DEPARTMENT_OTHER): Payer: Medicare Other

## 2013-07-04 ENCOUNTER — Telehealth: Payer: Self-pay | Admitting: Oncology

## 2013-07-04 ENCOUNTER — Telehealth: Payer: Self-pay | Admitting: *Deleted

## 2013-07-04 ENCOUNTER — Ambulatory Visit (HOSPITAL_BASED_OUTPATIENT_CLINIC_OR_DEPARTMENT_OTHER): Payer: Medicare Other

## 2013-07-04 ENCOUNTER — Ambulatory Visit (HOSPITAL_BASED_OUTPATIENT_CLINIC_OR_DEPARTMENT_OTHER): Payer: Medicare Other | Admitting: Oncology

## 2013-07-04 VITALS — BP 106/61 | HR 60 | Temp 97.4°F | Resp 18 | Ht 69.5 in | Wt 208.6 lb

## 2013-07-04 DIAGNOSIS — C787 Secondary malignant neoplasm of liver and intrahepatic bile duct: Secondary | ICD-10-CM

## 2013-07-04 DIAGNOSIS — R0602 Shortness of breath: Secondary | ICD-10-CM

## 2013-07-04 DIAGNOSIS — C189 Malignant neoplasm of colon, unspecified: Secondary | ICD-10-CM

## 2013-07-04 DIAGNOSIS — Z5111 Encounter for antineoplastic chemotherapy: Secondary | ICD-10-CM

## 2013-07-04 DIAGNOSIS — R05 Cough: Secondary | ICD-10-CM

## 2013-07-04 DIAGNOSIS — C187 Malignant neoplasm of sigmoid colon: Secondary | ICD-10-CM

## 2013-07-04 DIAGNOSIS — R197 Diarrhea, unspecified: Secondary | ICD-10-CM

## 2013-07-04 DIAGNOSIS — R5381 Other malaise: Secondary | ICD-10-CM

## 2013-07-04 LAB — COMPREHENSIVE METABOLIC PANEL (CC13)
AST: 23 U/L (ref 5–34)
Albumin: 3.1 g/dL — ABNORMAL LOW (ref 3.5–5.0)
Anion Gap: 11 mEq/L (ref 3–11)
BUN: 34.6 mg/dL — ABNORMAL HIGH (ref 7.0–26.0)
CO2: 25 mEq/L (ref 22–29)
Calcium: 9.2 mg/dL (ref 8.4–10.4)
Chloride: 104 mEq/L (ref 98–109)
Creatinine: 1.2 mg/dL (ref 0.7–1.3)
Potassium: 4.3 mEq/L (ref 3.5–5.1)

## 2013-07-04 LAB — CBC WITH DIFFERENTIAL/PLATELET
BASO%: 0.5 % (ref 0.0–2.0)
Basophils Absolute: 0 10*3/uL (ref 0.0–0.1)
EOS%: 4.9 % (ref 0.0–7.0)
Eosinophils Absolute: 0.4 10*3/uL (ref 0.0–0.5)
HCT: 31 % — ABNORMAL LOW (ref 38.4–49.9)
HGB: 10.1 g/dL — ABNORMAL LOW (ref 13.0–17.1)
LYMPH%: 14.9 % (ref 14.0–49.0)
MCH: 29 pg (ref 27.2–33.4)
MCHC: 32.6 g/dL (ref 32.0–36.0)
NEUT%: 73.6 % (ref 39.0–75.0)
Platelets: 171 10*3/uL (ref 140–400)
RBC: 3.48 10*6/uL — ABNORMAL LOW (ref 4.20–5.82)
WBC: 8.2 10*3/uL (ref 4.0–10.3)
lymph#: 1.2 10*3/uL (ref 0.9–3.3)

## 2013-07-04 MED ORDER — DEXAMETHASONE SODIUM PHOSPHATE 20 MG/5ML IJ SOLN
20.0000 mg | Freq: Once | INTRAMUSCULAR | Status: AC
  Administered 2013-07-04: 20 mg via INTRAVENOUS

## 2013-07-04 MED ORDER — FLUOROURACIL CHEMO INJECTION 2.5 GM/50ML
300.0000 mg/m2 | Freq: Once | INTRAVENOUS | Status: AC
  Administered 2013-07-04: 650 mg via INTRAVENOUS
  Filled 2013-07-04: qty 13

## 2013-07-04 MED ORDER — ONDANSETRON 16 MG/50ML IVPB (CHCC)
INTRAVENOUS | Status: AC
  Filled 2013-07-04: qty 16

## 2013-07-04 MED ORDER — DEXAMETHASONE SODIUM PHOSPHATE 20 MG/5ML IJ SOLN
INTRAMUSCULAR | Status: AC
  Filled 2013-07-04: qty 5

## 2013-07-04 MED ORDER — SODIUM CHLORIDE 0.9 % IJ SOLN
10.0000 mL | INTRAMUSCULAR | Status: DC | PRN
  Filled 2013-07-04: qty 10

## 2013-07-04 MED ORDER — ATROPINE SULFATE 1 MG/ML IJ SOLN
0.5000 mg | Freq: Once | INTRAMUSCULAR | Status: AC | PRN
  Administered 2013-07-04: 0.5 mg via INTRAVENOUS

## 2013-07-04 MED ORDER — SODIUM CHLORIDE 0.9 % IV SOLN
1800.0000 mg/m2 | INTRAVENOUS | Status: DC
  Administered 2013-07-04: 3900 mg via INTRAVENOUS
  Filled 2013-07-04: qty 78

## 2013-07-04 MED ORDER — SODIUM CHLORIDE 0.9 % IV SOLN
Freq: Once | INTRAVENOUS | Status: AC
  Administered 2013-07-04: 11:00:00 via INTRAVENOUS

## 2013-07-04 MED ORDER — IRINOTECAN HCL CHEMO INJECTION 100 MG/5ML
45.0000 mg/m2 | Freq: Once | INTRAVENOUS | Status: AC
  Administered 2013-07-04: 100 mg via INTRAVENOUS
  Filled 2013-07-04: qty 5

## 2013-07-04 MED ORDER — ONDANSETRON 16 MG/50ML IVPB (CHCC)
16.0000 mg | Freq: Once | INTRAVENOUS | Status: AC
  Administered 2013-07-04: 16 mg via INTRAVENOUS

## 2013-07-04 MED ORDER — LEUCOVORIN CALCIUM INJECTION 350 MG
650.0000 mg | Freq: Once | INTRAVENOUS | Status: AC
  Administered 2013-07-04: 650 mg via INTRAVENOUS
  Filled 2013-07-04: qty 32.5

## 2013-07-04 MED ORDER — ATROPINE SULFATE 1 MG/ML IJ SOLN
INTRAMUSCULAR | Status: AC
  Filled 2013-07-04: qty 1

## 2013-07-04 NOTE — Progress Notes (Signed)
Chaplain made follow-up visit with pt and his wife. Pt said he was doing well and "was still here." Wife affirmed that it was difficult but "better than the alternative." Pt went back for treatment but wife stayed at healing arts table and conversed for a while. Wife commented that she couldn't do crafts because she was always jittery and anxious while her husband was receiving chemo and especially couldn't be in the room while his port was accessed. Chaplain and artist in residence validated and normalized this feeling of anxiety. Wife also discussed how the pt was learning to interact with his grandchildren without being infected with their germs. She stated it was a challenge, but that the children had adapted to it. Wife said that patient was generally doing well but that they were now in "maintenance chemo" and there was "no end in sight" to his treatment. Chaplain and artist practiced empathic presence and reflective listening as she shared this. Pt's wife expressed thanks for visit. Chaplain will follow up as necessary.

## 2013-07-04 NOTE — Progress Notes (Signed)
Hematology and Oncology Follow Up Visit  Todd [name] [id number] [birth year] 79 y.o. 07/04/2013 9:19 AM [name], MDBreen, [name], MD   Principle Diagnosis: 79 year old with stage IV colon cancer diagnosed in 08/2012. He presented with a colonic mass and liver lesions.  KRAS wild type.   Prior Therapy: He is S/P LAPAROSCOPIC ASSISTED SIGMOID COLON RESECTION, END Left OSTOMY, Hartmann's pouch and acement of power port done on 09/07/2012.   Current therapy:  He started FOLFIRI and Avastin on 3/26. Avastin was stopped after 4 cycles due to epistaxis. He is S/P 8 cycles of treatment last one given on 01/24/2013. He has been on a treatment break for the last 3 months.  FOLFIRI restarted on 10/1 with reduced doses (60% of the CPT 11).  He is here before the 7th cycle of FOLFIRI restart.   Interim History: Todd [name] presents today for a follow up visit. He is a very nice man with stage IV colon cancer. He under went surgical resection on 09/07/2012 and have done well chemotherapy in general, but after 8 cycles of chemotherapy he has been on a treatment break.  He was started on FOLFIRI at a reduced dose and received the 6th cycle of that without any complications. Has not reported any nausea or vomiting. He has reported some diarrhea and overall weakness. He reported grade 2 fatigue but otherwise no other complications. He still able to perform most activities of daily living without any hindrance or decline. His lower extremity edema has improved. His overall performance status and activity level continue to improve. His appetite and quality of life continue to be reasonable. He has not had any illnesses or hospitalizations. Has not reported any new complaints.  Medications: I have reviewed the patient's current medications.  Current Outpatient Prescriptions  Medication Sig Dispense Refill  . aspirin 81 MG tablet Take 81 mg by mouth daily.        . benazepril (LOTENSIN) 10 MG tablet Take 1 tablet  (10 mg total) by mouth daily.  90 tablet  3  . chlorhexidine (PERIDEX) 0.12 % solution Use as directed 15 mLs in the mouth or throat 2 (two) times daily. Use as directed for mouth irritation      . diclofenac (VOLTAREN) 75 MG EC tablet Take 1 tablet (75 mg total) by mouth 2 (two) times daily as needed.  180 tablet  0  . diclofenac sodium (VOLTAREN) 1 % GEL Apply 4 g topically 4 (four) times daily.  100 g  2  . diphenoxylate-atropine (LOMOTIL) 2.5-0.025 MG per tablet Take 2 tablets by mouth 4 (four) times daily as needed for diarrhea or loose stools.  60 tablet  1  . doxazosin (CARDURA) 8 MG tablet Take 4 mg by mouth 2 (two) times daily.       Marland Kitchen gabapentin (NEURONTIN) 300 MG capsule Take 300 mg by mouth as needed.      . hydrochlorothiazide (HYDRODIURIL) 25 MG tablet TAKE 1 TABLET BY MOUTH EVERY DAY  90 tablet  3  . isosorbide mononitrate (IMDUR) 60 MG 24 hr tablet TAKE 1 TABLET BY MOUTH EVERY DAY  90 tablet  3  . lidocaine-prilocaine (EMLA) cream Apply topically as needed. Prior to chemotherapy treatment, Apply approx 1/2 teaspoon to skin over port, do not rub in. Cover with plastic,  30 g  1  . metoprolol succinate (TOPROL-XL) 50 MG 24 hr tablet Take 50 mg by mouth daily. Take with or immediately following a meal.      .  Multiple Vitamin (MULTIVITAMIN) tablet Take 1 tablet by mouth daily.        . mupirocin cream (BACTROBAN) 2 % Apply topically as needed.      . nitroGLYCERIN (NITROSTAT) 0.4 MG SL tablet Place 0.4 mg under the tongue every 5 (five) minutes as needed.        . Nutritional Supplements (ENSURE PO) Take 1 Can by mouth daily.      Bertram Gala Glycol-Propyl Glycol (SYSTANE OP) Place 1 drop into both eyes daily.       . prochlorperazine (COMPAZINE) 10 MG tablet Take 1 tablet (10 mg total) by mouth every 6 (six) hours as needed.  30 tablet  1  . simvastatin (ZOCOR) 40 MG tablet Take 20 mg by mouth every morning.      . sodium chloride (OCEAN) 0.65 % SOLN nasal spray Place 1 spray into the  nose as needed for congestion.  1 Bottle  0  . torsemide (DEMADEX) 20 MG tablet Take 1 tablet (20 mg total) by mouth every evening.  90 tablet  3  . VITAMIN E PO Take 1 capsule by mouth at bedtime.      [name] 20 MG TABS tablet TAKE 1 TABLET BY MOUTH DAILY  30 tablet  0   No current facility-administered medications for this visit.    Allergies: No Known Allergies  Past Medical History, Surgical history, Social history, and Family History were reviewed and updated.  Review of Systems:  Remaining ROS negative.  Physical Exam: Blood pressure 106/61, pulse 60, temperature 97.4 F (36.3 C), temperature source Oral, resp. rate 18, height 5' 9.5" (1.765 m), weight 208 lb 9.6 oz (94.62 kg). ECOG: 1 General appearance: alert Head: Normocephalic, without obvious abnormality, atraumatic Neck: no adenopathy, no carotid bruit, no JVD, supple, symmetrical, trachea midline and thyroid not enlarged, symmetric, no tenderness/mass/nodules Lymph nodes: Cervical, supraclavicular, and axillary nodes normal. Heart:regular rate and rhythm, S1, S2 normal, no murmur, click, rub or gallop Lung:chest clear, no wheezing, rales, normal symmetric air entry Abdomen: soft, non-tender, without masses or organomegaly EXT:no erythema, induration, or nodules.  1 + edema.    Lab Results: Lab Results  Component Value Date   WBC 8.2 07/04/2013   HGB 10.1* 07/04/2013   HCT 31.0* 07/04/2013   MCV 89.1 07/04/2013   PLT 171 07/04/2013     Chemistry      Component Value Date/Time   NA 142 06/20/2013 0824   NA 139 04/04/2013 0538   K 3.9 06/20/2013 0824   K 3.6 04/04/2013 0538   CL 103 04/04/2013 0538   CL 105 01/10/2013 0831   CO2 25 06/20/2013 0824   CO2 27 04/04/2013 0538   BUN 33.1* 06/20/2013 0824   BUN 17 04/04/2013 0538   CREATININE 1.1 06/20/2013 0824   CREATININE 0.94 04/04/2013 0538   CREATININE 0.76 06/27/2012 0856      Component Value Date/Time   CALCIUM 9.0 06/20/2013 0824   CALCIUM 9.1  04/04/2013 0538   ALKPHOS 48 06/20/2013 0824   ALKPHOS 47 04/01/2013 1350   AST 27 06/20/2013 0824   AST 25 04/01/2013 1350   ALT 24 06/20/2013 0824   ALT 16 04/01/2013 1350   BILITOT 0.69 06/20/2013 0824   BILITOT 0.9 04/01/2013 1350         Impression and Plan:  79 year old man with :  1. Stage IV colon cancer. He is S/P sigmoid colon resection with a bulky tumor in the liver. On palliative chemotherapy  with FOLFIRI/Avastin. He is S/P cycle 8 cycles. CT scan results from 7/14 (after 8 cycles) showed slight decline in tumor burden.  After 4 cycles of FOLFIRI restart at a lower dose, his CT scan on 06/05/2013  continued to show benefit from chemotherapy both clinically and radiographically. The plan is to continue with the current dose and schedule at least for the time being. I plan to repeat another CT scan in 07/2013. He is ready to proceed with the next cycle of chemotherapy.   2. Port management. Port in place.   3. Congestion/cough and shortness of breath: mostly due to CHF. This has improved dramatically at this time.  4. Epistaxis: improved off Avastin.   5. Diarrhea. Due to Irinotecan. Recommend that he continue Imodium and Lomotil as needed.   6. Follow up: on 12/23 for the next chemotherapy.    Hatice Bubel 12/10/20149:19 AM

## 2013-07-04 NOTE — Patient Instructions (Addendum)
Friday 11 AM.    [facility] Discharge Instructions for Patients Receiving Chemotherapy  Today you received the following chemotherapy agents irinotecan, leucovorin, adrucil.  To help prevent nausea and vomiting after your treatment, we encourage you to take your nausea medication as needed.    If you develop nausea and vomiting that is not controlled by your nausea medication, call the clinic.   BELOW ARE SYMPTOMS THAT SHOULD BE REPORTED IMMEDIATELY:  *FEVER GREATER THAN 100.5 F  *CHILLS WITH OR WITHOUT FEVER  NAUSEA AND VOMITING THAT IS NOT CONTROLLED WITH YOUR NAUSEA MEDICATION  *UNUSUAL SHORTNESS OF BREATH  *UNUSUAL BRUISING OR BLEEDING  TENDERNESS IN MOUTH AND THROAT WITH OR WITHOUT PRESENCE OF ULCERS  *URINARY PROBLEMS  *BOWEL PROBLEMS  UNUSUAL RASH Items with * indicate a potential emergency and should be followed up as soon as possible.  Feel free to call the clinic should you have any questions or concerns. The clinic phone number is [phone].    

## 2013-07-04 NOTE — Telephone Encounter (Signed)
gavev pt appt for lab, md and chemo for december , emailed [place] for January 2015

## 2013-07-04 NOTE — Telephone Encounter (Signed)
Per staff message and POF I have scheduled appts.  JMW  

## 2013-07-06 ENCOUNTER — Ambulatory Visit (HOSPITAL_BASED_OUTPATIENT_CLINIC_OR_DEPARTMENT_OTHER): Payer: Medicare Other

## 2013-07-06 VITALS — BP 106/45 | HR 73 | Temp 98.7°F

## 2013-07-06 DIAGNOSIS — C787 Secondary malignant neoplasm of liver and intrahepatic bile duct: Secondary | ICD-10-CM

## 2013-07-06 DIAGNOSIS — Z452 Encounter for adjustment and management of vascular access device: Secondary | ICD-10-CM

## 2013-07-06 DIAGNOSIS — C189 Malignant neoplasm of colon, unspecified: Secondary | ICD-10-CM

## 2013-07-06 DIAGNOSIS — C187 Malignant neoplasm of sigmoid colon: Secondary | ICD-10-CM

## 2013-07-06 MED ORDER — HEPARIN SOD (PORK) LOCK FLUSH 100 UNIT/ML IV SOLN
500.0000 [IU] | Freq: Once | INTRAVENOUS | Status: AC | PRN
  Administered 2013-07-06: 500 [IU]
  Filled 2013-07-06: qty 5

## 2013-07-06 MED ORDER — SODIUM CHLORIDE 0.9 % IJ SOLN
10.0000 mL | INTRAMUSCULAR | Status: DC | PRN
  Administered 2013-07-06: 10 mL
  Filled 2013-07-06: qty 10

## 2013-07-10 ENCOUNTER — Telehealth: Payer: Self-pay | Admitting: Oncology

## 2013-07-10 NOTE — Telephone Encounter (Signed)
gave pt appt for lab ,md and chemo for 12/23

## 2013-07-17 ENCOUNTER — Other Ambulatory Visit (HOSPITAL_BASED_OUTPATIENT_CLINIC_OR_DEPARTMENT_OTHER): Payer: Medicare Other

## 2013-07-17 ENCOUNTER — Ambulatory Visit (HOSPITAL_BASED_OUTPATIENT_CLINIC_OR_DEPARTMENT_OTHER): Payer: Medicare Other | Admitting: Oncology

## 2013-07-17 VITALS — BP 110/40 | HR 56 | Temp 98.3°F | Resp 17 | Ht 69.0 in | Wt 206.4 lb

## 2013-07-17 DIAGNOSIS — R5381 Other malaise: Secondary | ICD-10-CM

## 2013-07-17 DIAGNOSIS — C787 Secondary malignant neoplasm of liver and intrahepatic bile duct: Secondary | ICD-10-CM

## 2013-07-17 DIAGNOSIS — C189 Malignant neoplasm of colon, unspecified: Secondary | ICD-10-CM

## 2013-07-17 LAB — COMPREHENSIVE METABOLIC PANEL (CC13)
ALT: 21 U/L (ref 0–55)
Anion Gap: 10 mEq/L (ref 3–11)
BUN: 53.3 mg/dL — ABNORMAL HIGH (ref 7.0–26.0)
CO2: 27 mEq/L (ref 22–29)
Calcium: 9.2 mg/dL (ref 8.4–10.4)
Chloride: 100 mEq/L (ref 98–109)
Creatinine: 1.8 mg/dL — ABNORMAL HIGH (ref 0.7–1.3)
Glucose: 138 mg/dl (ref 70–140)

## 2013-07-17 LAB — CBC WITH DIFFERENTIAL/PLATELET
BASO%: 0.5 % (ref 0.0–2.0)
Basophils Absolute: 0.1 10*3/uL (ref 0.0–0.1)
Eosinophils Absolute: 0.4 10*3/uL (ref 0.0–0.5)
HCT: 29.5 % — ABNORMAL LOW (ref 38.4–49.9)
HGB: 9.9 g/dL — ABNORMAL LOW (ref 13.0–17.1)
MCHC: 33.6 g/dL (ref 32.0–36.0)
MONO#: 0.9 10*3/uL (ref 0.1–0.9)
NEUT#: 10.8 10*3/uL — ABNORMAL HIGH (ref 1.5–6.5)
NEUT%: 83.6 % — ABNORMAL HIGH (ref 39.0–75.0)
Platelets: 166 10*3/uL (ref 140–400)
WBC: 12.9 10*3/uL — ABNORMAL HIGH (ref 4.0–10.3)
lymph#: 0.7 10*3/uL — ABNORMAL LOW (ref 0.9–3.3)

## 2013-07-17 NOTE — Progress Notes (Signed)
Hematology and Oncology Follow Up Visit  Todd [name] [id number] 1930/04/25 79 y.o. 07/17/2013 11:01 AM Todd [name], MDBreen, [name], MD   Principle Diagnosis: 79 year old with stage IV colon cancer diagnosed in 08/2012. He presented with a colonic mass and liver lesions.  KRAS wild type.   Prior Therapy: He is S/P LAPAROSCOPIC ASSISTED SIGMOID COLON RESECTION, END Left OSTOMY, Hartmann's pouch and acement of power port done on 09/07/2012.   Current therapy:  He started FOLFIRI and Avastin on 3/26. Avastin was stopped after 4 cycles due to epistaxis. He is S/P 8 cycles of treatment last one given on 01/24/2013. He has been on a treatment break for the last 3 months.  FOLFIRI restarted on 10/1 with reduced doses (60% of the CPT 11).  He is here before the 8th cycle of FOLFIRI restart.   Interim History: [name] presents today for a follow up visit. He is a very nice man with stage IV colon cancer. He under went surgical resection on 09/07/2012 and have done well chemotherapy in general, but after 8 cycles of chemotherapy he has been on a treatment break.  He was started on FOLFIRI at a reduced dose and received the 7th cycle of that without any complications. However, since his last treatment he have accumulated more side effects. He reported grade 3 fatigue as well as overall lethargy. He did have a minor fall while he was in the bathroom but did not injure himself. He did not report any trauma. His appetite has been poor but is slowly improving. Has not reported any nausea or vomiting. He has reported some diarrhea and overall weakness. His lower extremity edema has improved. He reports although he is feeling slightly better but not close to his baseline at this time.  Medications: I have reviewed the patient's current medications.  Current Outpatient Prescriptions  Medication Sig Dispense Refill  . aspirin 81 MG tablet Take 81 mg by mouth daily.        . benazepril (LOTENSIN) 10 MG tablet  Take 1 tablet (10 mg total) by mouth daily.  90 tablet  3  . chlorhexidine (PERIDEX) 0.12 % solution Use as directed 15 mLs in the mouth or throat 2 (two) times daily. Use as directed for mouth irritation      . diclofenac (VOLTAREN) 75 MG EC tablet Take 1 tablet (75 mg total) by mouth 2 (two) times daily as needed.  180 tablet  0  . diclofenac sodium (VOLTAREN) 1 % GEL Apply 4 g topically 4 (four) times daily.  100 g  2  . diphenoxylate-atropine (LOMOTIL) 2.5-0.025 MG per tablet Take 2 tablets by mouth 4 (four) times daily as needed for diarrhea or loose stools.  60 tablet  1  . doxazosin (CARDURA) 8 MG tablet Take 4 mg by mouth 2 (two) times daily.       Marland Kitchen gabapentin (NEURONTIN) 300 MG capsule Take 600 mg by mouth as needed.       . hydrochlorothiazide (HYDRODIURIL) 25 MG tablet TAKE 1 TABLET BY MOUTH EVERY DAY  90 tablet  3  . isosorbide mononitrate (IMDUR) 60 MG 24 hr tablet TAKE 1 TABLET BY MOUTH EVERY DAY  90 tablet  3  . metoprolol succinate (TOPROL-XL) 50 MG 24 hr tablet Take 50 mg by mouth daily. Take with or immediately following a meal.      . Multiple Vitamin (MULTIVITAMIN) tablet Take 1 tablet by mouth daily.        Marland Kitchen  mupirocin cream (BACTROBAN) 2 % Apply topically as needed.      . Nutritional Supplements (ENSURE PO) Take 1 Can by mouth daily.      Bertram Gala Glycol-Propyl Glycol (SYSTANE OP) Place 1 drop into both eyes daily.       . simvastatin (ZOCOR) 40 MG tablet Take 20 mg by mouth every morning.      . sodium chloride (OCEAN) 0.65 % SOLN nasal spray Place 1 spray into the nose as needed for congestion.  1 Bottle  0  . torsemide (DEMADEX) 20 MG tablet Take 1 tablet (20 mg total) by mouth every evening.  90 tablet  3  . VITAMIN E PO Take 1 capsule by mouth at bedtime.      [name] 20 MG TABS tablet TAKE 1 TABLET BY MOUTH DAILY  30 tablet  0  . lidocaine-prilocaine (EMLA) cream Apply topically as needed. Prior to chemotherapy treatment, Apply approx 1/2 teaspoon to skin over  port, do not rub in. Cover with plastic,  30 g  1  . nitroGLYCERIN (NITROSTAT) 0.4 MG SL tablet Place 0.4 mg under the tongue every 5 (five) minutes as needed.        . prochlorperazine (COMPAZINE) 10 MG tablet Take 1 tablet (10 mg total) by mouth every 6 (six) hours as needed.  30 tablet  1   No current facility-administered medications for this visit.    Allergies: No Known Allergies  Past Medical History, Surgical history, Social history, and Family History were reviewed and updated.  Review of Systems:  Remaining ROS negative.  Physical Exam: Blood pressure 110/40, pulse 56, temperature 98.3 F (36.8 C), temperature source Oral, resp. rate 17, height 5\' 9"  (1.753 m), weight 206 lb 6.4 oz (93.622 kg), SpO2 98.00%. ECOG: 1 General appearance: alert Head: Normocephalic, without obvious abnormality, atraumatic Neck: no adenopathy, no carotid bruit, no JVD, supple, symmetrical, trachea midline and thyroid not enlarged, symmetric, no tenderness/mass/nodules Lymph nodes: Cervical, supraclavicular, and axillary nodes normal. Heart:regular rate and rhythm, S1, S2 normal, no murmur, click, rub or gallop Lung:chest clear, no wheezing, rales, normal symmetric air entry Abdomen: soft, non-tender, without masses or organomegaly EXT:no erythema, induration, or nodules.  1 + edema.    Lab Results: Lab Results  Component Value Date   WBC 12.9* 07/17/2013   HGB 9.9* 07/17/2013   HCT 29.5* 07/17/2013   MCV 89.5 07/17/2013   PLT 166 07/17/2013     Chemistry      Component Value Date/Time   NA 140 07/04/2013 0843   NA 139 04/04/2013 0538   K 4.3 07/04/2013 0843   K 3.6 04/04/2013 0538   CL 103 04/04/2013 0538   CL 105 01/10/2013 0831   CO2 25 07/04/2013 0843   CO2 27 04/04/2013 0538   BUN 34.6* 07/04/2013 0843   BUN 17 04/04/2013 0538   CREATININE 1.2 07/04/2013 0843   CREATININE 0.94 04/04/2013 0538   CREATININE 0.76 06/27/2012 0856      Component Value Date/Time   CALCIUM 9.2  07/04/2013 0843   CALCIUM 9.1 04/04/2013 0538   ALKPHOS 44 07/04/2013 0843   ALKPHOS 47 04/01/2013 1350   AST 23 07/04/2013 0843   AST 25 04/01/2013 1350   ALT 21 07/04/2013 0843   ALT 16 04/01/2013 1350   BILITOT 0.55 07/04/2013 0843   BILITOT 0.9 04/01/2013 1350         Impression and Plan:  79 year old man with :  1. Stage IV colon  cancer. He is S/P sigmoid colon resection with a bulky tumor in the liver. On palliative chemotherapy with FOLFIRI/Avastin. He is S/P cycle 8 cycles. CT scan results from 7/14 (after 8 cycles) showed slight decline in tumor burden.  After 4 cycles of FOLFIRI restart at a lower dose, his CT scan on 06/05/2013  continued to show benefit from chemotherapy both clinically and radiographically. He is not ready to proceed with the next cycle of chemotherapy for that reason we will hold treatment today. We will reevaluated in 2 weeks' time and the plan to resume chemotherapy if he is feeling better.   2. Weakness fatigue and tiredness: This is likely related to chemotherapy accumulation and for that reason we will hold chemotherapy today.  3. Congestion/cough and shortness of breath: mostly due to CHF. This has improved dramatically at this time.  4. Epistaxis: improved off Avastin.   5. Diarrhea. Due to Irinotecan. Recommend that he continue Imodium and Lomotil as needed.   6. Follow up: on 08/01/2013.    Natashia Roseman 12/23/201411:01 AM

## 2013-07-18 ENCOUNTER — Inpatient Hospital Stay: Payer: Medicare Other

## 2013-07-18 ENCOUNTER — Telehealth: Payer: Self-pay | Admitting: Medical Oncology

## 2013-07-18 NOTE — Telephone Encounter (Signed)
Patient called Todd [name] with treatment room reporting "bloody urine" that started overnight. Return call to patient, patient reports saw "2-4 drops on napkin this morning." Denies pain, fever or any other symptoms, states this has not happened before. Review with on-call MD, Per Dr Bertis Ruddy patient to increase fluid intake and to monitor for now. Should patient notice increase in blood, or change in color to pink or "ketchup" color patient to proceed to ED. Patient gave verbal understanding, no further questions at this time.

## 2013-07-20 ENCOUNTER — Other Ambulatory Visit (HOSPITAL_COMMUNITY): Payer: Self-pay | Admitting: Family Medicine

## 2013-07-23 ENCOUNTER — Telehealth: Payer: Self-pay | Admitting: Family Medicine

## 2013-07-23 NOTE — Telephone Encounter (Signed)
Patient to speak to Dr. Mauricio Po about a few concerns he has.

## 2013-07-25 ENCOUNTER — Encounter: Payer: Self-pay | Admitting: Family Medicine

## 2013-07-25 ENCOUNTER — Ambulatory Visit (INDEPENDENT_AMBULATORY_CARE_PROVIDER_SITE_OTHER): Payer: Medicare Other | Admitting: Family Medicine

## 2013-07-25 VITALS — BP 136/79 | HR 57 | Ht 70.0 in | Wt 196.0 lb

## 2013-07-25 DIAGNOSIS — M79672 Pain in left foot: Secondary | ICD-10-CM

## 2013-07-25 DIAGNOSIS — M79609 Pain in unspecified limb: Secondary | ICD-10-CM

## 2013-07-25 MED ORDER — PREDNISONE (PAK) 10 MG PO TABS: ORAL_TABLET | ORAL | Status: DC

## 2013-07-25 NOTE — Patient Instructions (Signed)
Wear heel lifts. Take prednisone as directed for 12 days. If improving over next few days do calf raises 3 sets of 5 once a day, work way up to 3 sets of 10 once a day. Follow up with me in 3-4 weeks for reevaluation.

## 2013-07-26 ENCOUNTER — Other Ambulatory Visit (HOSPITAL_COMMUNITY): Payer: Self-pay | Admitting: Family Medicine

## 2013-07-27 ENCOUNTER — Telehealth: Payer: Self-pay | Admitting: *Deleted

## 2013-07-27 ENCOUNTER — Encounter: Payer: Self-pay | Admitting: Family Medicine

## 2013-07-27 DIAGNOSIS — M79673 Pain in unspecified foot: Secondary | ICD-10-CM | POA: Insufficient documentation

## 2013-07-27 MED ORDER — RIVAROXABAN 20 MG PO TABS: ORAL_TABLET | ORAL | Status: DC

## 2013-07-27 NOTE — Telephone Encounter (Signed)
Rx was sent to pharmacy electronically. 

## 2013-07-27 NOTE — Progress Notes (Signed)
Patient ID: Todd [name], male   DOB: [birth year], 80 y.o.   MRN: [id number]  PCP: Todd Niece, MD  Subjective:   HPI: Patient is a 80 y.o. male here for left heel pain.  Patient reports he started to get left heel pain about 4-5 days ago. Presenting very similar to gout attacks he's had in the back. Swelling, bad pain, hard to get comfortable. Up to 10/10 now. No change in activity. Recently had a gout flare in hands, knee, right elbow, back. Has tried voltaren gel, heat, oral voltaren, tylenol, advil over past 5 days. Today took some old 30mg  prednisone.  Past Medical History  Diagnosis Date  . Myocardial infarction 1990    "Massive"- total LAD with collaterals  . Gout   . Hypertension   . Arthritis, rheumatoid   . Diabetes mellitus without complication   . CHF (congestive heart failure)   . BPH (benign prostatic hyperplasia)   . Neuropathy   . Hyperlipidemia   . Ischemic cardiomyopathy March 2012    EF 31% Myoview. Pt declines ICD  . Colon cancer 2014    Surg, chemo March 2014    Current Outpatient Prescriptions on File Prior to Visit  Medication Sig Dispense Refill  . aspirin 81 MG tablet Take 81 mg by mouth daily.        . benazepril (LOTENSIN) 10 MG tablet Take 1 tablet (10 mg total) by mouth daily.  90 tablet  3  . chlorhexidine (PERIDEX) 0.12 % solution Use as directed 15 mLs in the mouth or throat 2 (two) times daily. Use as directed for mouth irritation      . diclofenac (VOLTAREN) 75 MG EC tablet Take 1 tablet (75 mg total) by mouth 2 (two) times daily as needed.  180 tablet  0  . diclofenac sodium (VOLTAREN) 1 % GEL Apply 4 g topically 4 (four) times daily.  100 g  2  . diphenoxylate-atropine (LOMOTIL) 2.5-0.025 MG per tablet Take 2 tablets by mouth 4 (four) times daily as needed for diarrhea or loose stools.  60 tablet  1  . doxazosin (CARDURA) 8 MG tablet Take 4 mg by mouth 2 (two) times daily.       Marland Kitchen gabapentin (NEURONTIN) 300 MG capsule Take 600 mg by  mouth as needed.       . hydrochlorothiazide (HYDRODIURIL) 25 MG tablet TAKE 1 TABLET BY MOUTH EVERY DAY  90 tablet  3  . isosorbide mononitrate (IMDUR) 60 MG 24 hr tablet TAKE 1 TABLET BY MOUTH EVERY DAY  90 tablet  3  . lidocaine-prilocaine (EMLA) cream Apply topically as needed. Prior to chemotherapy treatment, Apply approx 1/2 teaspoon to skin over port, do not rub in. Cover with plastic,  30 g  1  . metoprolol succinate (TOPROL-XL) 50 MG 24 hr tablet Take 50 mg by mouth daily. Take with or immediately following a meal.      . Multiple Vitamin (MULTIVITAMIN) tablet Take 1 tablet by mouth daily.        . mupirocin cream (BACTROBAN) 2 % Apply topically as needed.      . nitroGLYCERIN (NITROSTAT) 0.4 MG SL tablet Place 0.4 mg under the tongue every 5 (five) minutes as needed.        . Nutritional Supplements (ENSURE PO) Take 1 Can by mouth daily.      Todd [name] Glycol-Propyl Glycol (SYSTANE OP) Place 1 drop into both eyes daily.       . prochlorperazine (COMPAZINE) 10 MG  tablet Take 1 tablet (10 mg total) by mouth every 6 (six) hours as needed.  30 tablet  1  . simvastatin (ZOCOR) 40 MG tablet Take 20 mg by mouth every morning.      . sodium chloride (OCEAN) 0.65 % SOLN nasal spray Place 1 spray into the nose as needed for congestion.  1 Bottle  0  . torsemide (DEMADEX) 20 MG tablet Take 1 tablet (20 mg total) by mouth every evening.  90 tablet  3  . VITAMIN E PO Take 1 capsule by mouth at bedtime.      Todd [name] 20 MG TABS tablet TAKE 1 TABLET BY MOUTH DAILY  30 tablet  0   No current facility-administered medications on file prior to visit.    Past Surgical History  Procedure Laterality Date  . Cataract extraction    . Flexible sigmoidoscopy N/A 09/05/2012    Procedure: FLEXIBLE SIGMOIDOSCOPY;  Surgeon: Todd Sabins, MD;  Location: [place];  Service: Endoscopy;  Laterality: N/A;  Give 1000 cc tapwater enema on call to endoscopy.  . Colon resection N/A 09/07/2012    Procedure:  LAPAROSCOPIC ASSISTED SIGMOID COLON RESECTION END OSTOMY;  Surgeon: Todd Medal, MD;  Location: [place];  Service: General;  Laterality: N/A;  . Portacath placement N/A 09/07/2012    Procedure: Placement of power port;  Surgeon: Todd Medal, MD;  Location: [place];  Service: General;  Laterality: N/A;    No Known Allergies  History   Social History  . Marital Status: Married    Spouse Name: N/A    Number of Children: 2  . Years of Education: N/A   Occupational History  . Retired, food and nutritional services    Social History Main Topics  . Smoking status: Former Smoker -- 1.00 packs/day    Quit date: 04/01/2009  . Smokeless tobacco: Never Used     Comment: Quit 45 years ago.  . Alcohol Use: No  . Drug Use: No  . Sexual Activity: Not Currently   Other Topics Concern  . Not on file   Social History Narrative   Emergency Contact:wife, Todd [name] at [place]:   Who lives with you: Lives with wife in 1 story home.   Any pets: does not believe in them   Diet: Patient was the [employer] of food services here at [employer] [facility] [employer].  Has a varied diet and is currently trying to lose weight by decreasing protein and carbohydrates daily.   Exercise: water aerobix 3x a week for 45 mins and uses row bike 2 times a week for 20 minutes   Seatbelts: wears seatbelt while in vehicles   Sun Exposure/Protection: Patient reports wearing sun screen   Hobbies: numbers, cruises             Family History  Problem Relation Age of Onset  . Stroke Father   . Diabetes Father   . Hypertension Father   . Heart attack Father   . Hyperlipidemia Neg Hx   . Sudden death Neg Hx     BP 136/79  Pulse 57  Ht 5\' 10"  (1.778 m)  Wt 196 lb (88.905 kg)  BMI 28.12 kg/m2  Review of Systems: See HPI above.    Objective:  Physical Exam:  Gen: NAD  Left foot/ankle: Mild swelling, warmth distal achilles.  No bruising, other deformity. FROM but pain with plantar flexion, passive  dorsiflexion. TTP insertion of achilles, post calcaneus. Negative ant drawer and  talar tilt.   Negative syndesmotic compression. Thompsons test negative. NV intact distally.  MSK u/s:  Mild increase in fluid in retrocalcaneal bursa.  Increased neovascularity in this area and within distal achilles.    Assessment & Plan:  1. Left heel pain - Consistent with prior gout flares but advised patient I wouldn't also rule out achilles tendinitis, retrocalcaneal bursitis as primary cause unrelated to gout.  Icing, prednisone, heel lifts.  Consider adding home exercises as he improves over next few days.  F/u in 3-4 weeks.

## 2013-07-27 NOTE — Assessment & Plan Note (Signed)
Consistent with prior gout flares but advised patient I wouldn't also rule out achilles tendinitis, retrocalcaneal bursitis as primary cause unrelated to gout.  Icing, prednisone, heel lifts.  Consider adding home exercises as he improves over next few days.  F/u in 3-4 weeks.

## 2013-07-27 NOTE — Telephone Encounter (Signed)
[name] was calling about a Xarelto refill. He stated that Dr. Lucilla Lame prescribed it to him and he can not get Dr. Lucilla Lame to fill it. He wanted to know if Dr. Gwenlyn Found can fill it for him being he is out. He stated that the pharmacy has tried to get in contact with Dr. Newman Pies office and no one will get back to them. Pt wants to have a call back about this.

## 2013-08-01 ENCOUNTER — Inpatient Hospital Stay: Payer: Medicare Other

## 2013-08-01 ENCOUNTER — Telehealth: Payer: Self-pay | Admitting: *Deleted

## 2013-08-01 ENCOUNTER — Telehealth: Payer: Self-pay | Admitting: Oncology

## 2013-08-01 ENCOUNTER — Other Ambulatory Visit (HOSPITAL_BASED_OUTPATIENT_CLINIC_OR_DEPARTMENT_OTHER): Payer: Medicare Other

## 2013-08-01 ENCOUNTER — Ambulatory Visit (HOSPITAL_BASED_OUTPATIENT_CLINIC_OR_DEPARTMENT_OTHER): Payer: Medicare Other | Admitting: Oncology

## 2013-08-01 VITALS — BP 149/72 | HR 59 | Temp 97.0°F | Resp 18 | Ht 70.0 in | Wt 202.1 lb

## 2013-08-01 DIAGNOSIS — R05 Cough: Secondary | ICD-10-CM

## 2013-08-01 DIAGNOSIS — R059 Cough, unspecified: Secondary | ICD-10-CM

## 2013-08-01 DIAGNOSIS — I509 Heart failure, unspecified: Secondary | ICD-10-CM

## 2013-08-01 DIAGNOSIS — R5381 Other malaise: Secondary | ICD-10-CM

## 2013-08-01 DIAGNOSIS — C189 Malignant neoplasm of colon, unspecified: Secondary | ICD-10-CM

## 2013-08-01 DIAGNOSIS — C787 Secondary malignant neoplasm of liver and intrahepatic bile duct: Secondary | ICD-10-CM

## 2013-08-01 DIAGNOSIS — R040 Epistaxis: Secondary | ICD-10-CM

## 2013-08-01 DIAGNOSIS — C187 Malignant neoplasm of sigmoid colon: Secondary | ICD-10-CM

## 2013-08-01 DIAGNOSIS — R5383 Other fatigue: Secondary | ICD-10-CM

## 2013-08-01 DIAGNOSIS — R197 Diarrhea, unspecified: Secondary | ICD-10-CM

## 2013-08-01 LAB — COMPREHENSIVE METABOLIC PANEL (CC13)
ALK PHOS: 59 U/L (ref 40–150)
ALT: 34 U/L (ref 0–55)
ANION GAP: 11 meq/L (ref 3–11)
AST: 27 U/L (ref 5–34)
Albumin: 3.2 g/dL — ABNORMAL LOW (ref 3.5–5.0)
BILIRUBIN TOTAL: 0.68 mg/dL (ref 0.20–1.20)
BUN: 29.8 mg/dL — AB (ref 7.0–26.0)
CO2: 28 meq/L (ref 22–29)
CREATININE: 1 mg/dL (ref 0.7–1.3)
Calcium: 9.4 mg/dL (ref 8.4–10.4)
Chloride: 100 mEq/L (ref 98–109)
GLUCOSE: 169 mg/dL — AB (ref 70–140)
Potassium: 3.3 mEq/L — ABNORMAL LOW (ref 3.5–5.1)
SODIUM: 139 meq/L (ref 136–145)
TOTAL PROTEIN: 7 g/dL (ref 6.4–8.3)

## 2013-08-01 LAB — CBC WITH DIFFERENTIAL/PLATELET
BASO%: 0.7 % (ref 0.0–2.0)
Basophils Absolute: 0.1 10*3/uL (ref 0.0–0.1)
EOS ABS: 0 10*3/uL (ref 0.0–0.5)
EOS%: 0.1 % (ref 0.0–7.0)
HEMATOCRIT: 33.4 % — AB (ref 38.4–49.9)
HGB: 11.3 g/dL — ABNORMAL LOW (ref 13.0–17.1)
LYMPH%: 11.4 % — AB (ref 14.0–49.0)
MCH: 30.7 pg (ref 27.2–33.4)
MCHC: 34 g/dL (ref 32.0–36.0)
MCV: 90.5 fL (ref 79.3–98.0)
MONO#: 1.3 10*3/uL — ABNORMAL HIGH (ref 0.1–0.9)
MONO%: 8.1 % (ref 0.0–14.0)
NEUT%: 79.7 % — AB (ref 39.0–75.0)
NEUTROS ABS: 12.4 10*3/uL — AB (ref 1.5–6.5)
PLATELETS: 206 10*3/uL (ref 140–400)
RBC: 3.69 10*6/uL — AB (ref 4.20–5.82)
RDW: 20.8 % — ABNORMAL HIGH (ref 11.0–14.6)
WBC: 15.6 10*3/uL — ABNORMAL HIGH (ref 4.0–10.3)
lymph#: 1.8 10*3/uL (ref 0.9–3.3)

## 2013-08-01 NOTE — Telephone Encounter (Signed)
Per staff message and POF I have scheduled appts.  JMW  

## 2013-08-01 NOTE — Progress Notes (Signed)
Hematology and Oncology Follow Up Visit  Todd [name] [id number] 16-Mar-1930 80 y.o. 08/01/2013 9:20 AM Todd [name], MDBreen, Todd Eriksson, MD   Principle Diagnosis: 80 year old with stage IV colon cancer diagnosed in 08/2012. He presented with a colonic mass and liver lesions.  KRAS wild type.   Prior Therapy: He is S/P LAPAROSCOPIC ASSISTED SIGMOID COLON RESECTION, END Left OSTOMY, Hartmann's pouch and acement of power port done on 09/07/2012.   Current therapy:  He started FOLFIRI and Avastin on 3/26. Avastin was stopped after 4 cycles due to epistaxis. He is S/P 8 cycles of treatment last one given on 01/24/2013. He has been on a treatment break for the last 3 months.  FOLFIRI restarted on 10/1 with reduced doses (60% of the CPT 11).  He is here before the 8th cycle of FOLFIRI restart.   Interim History: Todd [name] presents today for a follow up visit. He is a very nice man with stage IV colon cancer. He under went surgical resection on 09/07/2012 and have done well chemotherapy in general, but after 8 cycles of chemotherapy he has been on a treatment break.  He was started on FOLFIRI at a reduced dose and received the 7th cycle of that without any complications.  Treatment has been on hold due to accumulation of toxicities. He has felt better after holding the treatment 2 weeks ago but developed gout and tendinitis and currently on prednisone. His arthritic pain has improved, but developed some side effects associated with prednisone.  His appetite has been poor but is slowly improving. Has not reported any nausea or vomiting. He has reported some diarrhea and overall weakness. His lower extremity edema has improved. He reports although he is feeling slightly better but not close to his baseline at this time.  Medications: I have reviewed the patient's current medications.  Current Outpatient Prescriptions  Medication Sig Dispense Refill  . aspirin 81 MG tablet Take 81 mg by mouth daily.        .  benazepril (LOTENSIN) 10 MG tablet Take 1 tablet (10 mg total) by mouth daily.  90 tablet  3  . chlorhexidine (PERIDEX) 0.12 % solution Use as directed 15 mLs in the mouth or throat 2 (two) times daily. Use as directed for mouth irritation      . diclofenac (VOLTAREN) 75 MG EC tablet Take 1 tablet (75 mg total) by mouth 2 (two) times daily as needed.  180 tablet  0  . diclofenac sodium (VOLTAREN) 1 % GEL Apply 4 g topically 4 (four) times daily.  100 g  2  . diphenoxylate-atropine (LOMOTIL) 2.5-0.025 MG per tablet Take 2 tablets by mouth 4 (four) times daily as needed for diarrhea or loose stools.  60 tablet  1  . doxazosin (CARDURA) 8 MG tablet Take 4 mg by mouth 2 (two) times daily.       Marland Kitchen gabapentin (NEURONTIN) 300 MG capsule Take 600 mg by mouth as needed.       . hydrochlorothiazide (HYDRODIURIL) 25 MG tablet TAKE 1 TABLET BY MOUTH EVERY DAY  90 tablet  3  . isosorbide mononitrate (IMDUR) 60 MG 24 hr tablet TAKE 1 TABLET BY MOUTH EVERY DAY  90 tablet  3  . lidocaine-prilocaine (EMLA) cream Apply topically as needed. Prior to chemotherapy treatment, Apply approx 1/2 teaspoon to skin over port, do not rub in. Cover with plastic,  30 g  1  . metoprolol succinate (TOPROL-XL) 50 MG 24 hr tablet Take 50 mg  by mouth daily. Take with or immediately following a meal.      . Multiple Vitamin (MULTIVITAMIN) tablet Take 1 tablet by mouth daily.        . mupirocin cream (BACTROBAN) 2 % Apply topically as needed.      . nitroGLYCERIN (NITROSTAT) 0.4 MG SL tablet Place 0.4 mg under the tongue every 5 (five) minutes as needed.        . Nutritional Supplements (ENSURE PO) Take 1 Can by mouth daily.      [name] Glycol-Propyl Glycol (SYSTANE OP) Place 1 drop into both eyes daily.       . predniSONE (STERAPRED UNI-PAK) 10 MG tablet 6 tabs po days 1-2, 5 tabs po days 3-4, 4 tabs po days 5-6, 3 tabs po days 7-8, 2 tabs po days 9-10, 1 tab po days 11-12  42 tablet  0  . prochlorperazine (COMPAZINE) 10 MG tablet  Take 1 tablet (10 mg total) by mouth every 6 (six) hours as needed.  30 tablet  1  . Rivaroxaban (XARELTO) 20 MG TABS tablet TAKE 1 TABLET BY MOUTH DAILY  30 tablet  0  . simvastatin (ZOCOR) 40 MG tablet Take 20 mg by mouth every morning.      . sodium chloride (OCEAN) 0.65 % SOLN nasal spray Place 1 spray into the nose as needed for congestion.  1 Bottle  0  . torsemide (DEMADEX) 20 MG tablet Take 1 tablet (20 mg total) by mouth every evening.  90 tablet  3  . VITAMIN E PO Take 1 capsule by mouth at bedtime.      [name] 20 MG TABS tablet TAKE 1 TABLET BY MOUTH DAILY  30 tablet  0  . XARELTO 20 MG TABS tablet TAKE 1 TABLET BY MOUTH DAILY  30 tablet  0   No current facility-administered medications for this visit.    Allergies: No Known Allergies  Past Medical History, Surgical history, Social history, and Family History were reviewed and updated.  Review of Systems:  Remaining ROS negative.  Physical Exam: Blood pressure 149/72, pulse 59, temperature 97 F (36.1 C), temperature source Oral, resp. rate 18, height 5' 10"  (1.778 m), weight 202 lb 1.6 oz (91.672 kg). ECOG: 1 General appearance: alert Head: Normocephalic, without obvious abnormality, atraumatic Neck: no adenopathy, no carotid bruit, no JVD, supple, symmetrical, trachea midline and thyroid not enlarged, symmetric, no tenderness/mass/nodules Lymph nodes: Cervical, supraclavicular, and axillary nodes normal. Heart:regular rate and rhythm, S1, S2 normal, no murmur, click, rub or gallop Lung:chest clear, no wheezing, rales, normal symmetric air entry Abdomen: soft, non-tender, without masses or organomegaly EXT:no erythema, induration, or nodules.  1 + edema.    Lab Results: Lab Results  Component Value Date   WBC 15.6* 08/01/2013   HGB 11.3* 08/01/2013   HCT 33.4* 08/01/2013   MCV 90.5 08/01/2013   PLT 206 08/01/2013     Chemistry      Component Value Date/Time   NA 138 07/17/2013 1021   NA 139 04/04/2013 0538   K 3.5  07/17/2013 1021   K 3.6 04/04/2013 0538   CL 103 04/04/2013 0538   CL 105 01/10/2013 0831   CO2 27 07/17/2013 1021   CO2 27 04/04/2013 0538   BUN 53.3* 07/17/2013 1021   BUN 17 04/04/2013 0538   CREATININE 1.8* 07/17/2013 1021   CREATININE 0.94 04/04/2013 0538   CREATININE 0.76 06/27/2012 0856      Component Value Date/Time   CALCIUM 9.2  07/17/2013 1021   CALCIUM 9.1 04/04/2013 0538   ALKPHOS 51 07/17/2013 1021   ALKPHOS 47 04/01/2013 1350   AST 22 07/17/2013 1021   AST 25 04/01/2013 1350   ALT 21 07/17/2013 1021   ALT 16 04/01/2013 1350   BILITOT 1.47* 07/17/2013 1021   BILITOT 0.9 04/01/2013 1350         Impression and Plan:  80 year old man with :  1. Stage IV colon cancer. He is S/P sigmoid colon resection with a bulky tumor in the liver. On palliative chemotherapy with FOLFIRI/Avastin. He is S/P cycle 8 cycles. CT scan results from 7/14 (after 8 cycles) showed slight decline in tumor burden.  After 4 cycles of FOLFIRI restart at a lower dose, his CT scan on 06/05/2013  continued to show benefit from chemotherapy both clinically and radiographically. He is not ready to proceed with the next cycle of chemotherapy for that reason we will hold treatment today. We will reevaluated in 2 to 3 weeks' time and the plan to resume chemotherapy if he is feeling better.   2. Weakness fatigue and tiredness: This is likely related to chemotherapy accumulation and for that reason we will hold chemotherapy today.  3. Congestion/cough and shortness of breath: mostly due to CHF. This has improved dramatically at this time.  4. Epistaxis: improved off Avastin.   5. Diarrhea. Due to Irinotecan. Recommend that he continue Imodium and Lomotil as needed.   6. Follow up: on 08/21/2013.    JQDUKR,CVKFM 1/7/20159:20 AM

## 2013-08-01 NOTE — Telephone Encounter (Signed)
gv and printed aptp sched and avs for pt for Jan 2015...emailed MW to add tx.

## 2013-08-02 ENCOUNTER — Other Ambulatory Visit: Payer: Self-pay | Admitting: Family Medicine

## 2013-08-02 MED ORDER — RIVAROXABAN 20 MG PO TABS: ORAL_TABLET | ORAL | Status: DC

## 2013-08-02 NOTE — Telephone Encounter (Signed)
Called to patient, he recounted his visit with Dr Alen Blew yesterday and decision to forego next round of chemotx.  Still with some mild dizziness and chronic diarrhea.  Has visit with me tomorrow morning, to address prescribing Xarelto. JB

## 2013-08-03 ENCOUNTER — Ambulatory Visit (INDEPENDENT_AMBULATORY_CARE_PROVIDER_SITE_OTHER): Payer: Medicare Other | Admitting: Family Medicine

## 2013-08-03 ENCOUNTER — Encounter: Payer: Self-pay | Admitting: Oncology

## 2013-08-03 ENCOUNTER — Ambulatory Visit: Payer: Medicare Other | Admitting: Oncology

## 2013-08-03 ENCOUNTER — Encounter: Payer: Self-pay | Admitting: Family Medicine

## 2013-08-03 VITALS — BP 126/56 | HR 60 | Temp 97.8°F | Ht 70.0 in | Wt 200.0 lb

## 2013-08-03 DIAGNOSIS — R42 Dizziness and giddiness: Secondary | ICD-10-CM

## 2013-08-03 DIAGNOSIS — I2699 Other pulmonary embolism without acute cor pulmonale: Secondary | ICD-10-CM

## 2013-08-03 DIAGNOSIS — C787 Secondary malignant neoplasm of liver and intrahepatic bile duct: Secondary | ICD-10-CM

## 2013-08-03 DIAGNOSIS — C189 Malignant neoplasm of colon, unspecified: Secondary | ICD-10-CM

## 2013-08-03 MED ORDER — DICLOFENAC SODIUM 75 MG PO TBEC: 75.0000 mg | DELAYED_RELEASE_TABLET | Freq: Two times a day (BID) | ORAL | Status: DC | PRN

## 2013-08-03 MED ORDER — DOXAZOSIN MESYLATE 8 MG PO TABS: 4.0000 mg | ORAL_TABLET | Freq: Two times a day (BID) | ORAL | Status: DC

## 2013-08-03 MED ORDER — RIVAROXABAN 20 MG PO TABS: ORAL_TABLET | ORAL | Status: DC

## 2013-08-03 MED ORDER — SIMVASTATIN 40 MG PO TABS: 20.0000 mg | ORAL_TABLET | Freq: Every morning | ORAL | Status: DC

## 2013-08-03 NOTE — Assessment & Plan Note (Signed)
Describes intermittent episodes of dizziness that are not associated with other sxs; do not appear to be positional; remit in a matter of minutes.  No positional reproduction of symptoms in exam today.  His is not orthostatic today.  Given his history of CAD and diastolic CHF, raises question of possible dysrhythmia as a cause. He has been clear for a long time that he would not want to have an implantable defibrillator/pacemaker, but he would consider an ambulatory holter monitor if it might explain his symptoms.  We agreed to continue to watch for now; if continues with sx, he is to call and we will involve Dr Gwenlyn Found his cardiologist regarding ambulatory monitoring for this.

## 2013-08-03 NOTE — Assessment & Plan Note (Signed)
Patient followed by oncologist Dr Alen Blew; for follow up visit there at end of January to decide regarding further chemotherapy.

## 2013-08-03 NOTE — Progress Notes (Signed)
   Subjective:    Patient ID: Todd [name], male    DOB: 1929-08-12, 80 y.o.   MRN: [id number]  HPI Mr Rekowski is here for follow up. We spoke by phone yesterday, he is feeling somewhat more energetic today.   Saw Dr Shadad/oncology on Jan 7th, decision to postpone further decisions about chemotx until upcoming appt there on Jan 29th.  He reports that his epistaxis is better; diarrhea is tolerable but constant.  Fluid balance seems appropriate; he takes HCTZ 25/day and torsemide 20mg /day, with liberty to increase to 30 or 40mg /day if goes above his dry weight (unclothed bathroom scale at home) of 190-192 lbs.  This morning he was 191lb at home.  Notices his leg edema is much better with compression stockings (wearing now).   His biggest concern today is intermittent 'dizziness' that does not seem to be positional; not related to meals or time of day, or exertion.  May occur from 1 to 4 times in a week, out of the blue.  Often it may happen when he is out of the house.  No associated diaphoresis or nausea, no chest pain or dyspnea.  He has 'melted' to the floor with the episodes, which last from 8 to 12 minutes before spontaneous resolution.    He has found an app called [employer] that provides price transparency for all drugs; would like printed prescriptions to get his meds through this program.  Review of Systems See above. No fevers/chills, occasional cough with tinge of blood in sputum which he relates to Xarelto.  No dysuria or worsening of voiding stream.  No constipation.  Chronic L ear issues with decreased hearing; no ear pain or fullness.  No facial/sinus pain or fullness.     Objective:   Physical Exam Well appearing, no apparent distress.  HEENT TMs clear, good cone of light bilat.  No frontal or maxillary sinus tenderness. Neck supple without adenopathy.  COR Regular S1S2, PULM Clear bilaterally, no rales or wheezes.  Orthostatic BPs taken manually by me in the visit and not orthostatic  (see flow sheet for values). LEs; 1+ bilat ankle edema, no calf tenderness. Wearing stockings.       Assessment & Plan:

## 2013-08-03 NOTE — Assessment & Plan Note (Signed)
Diagnosed with PE Sept 2014, on Xarelto chronically for PE in setting of malignancy.  New prescriptions given in paper format for him to submit for cheapest pharmacy on [facility] website.

## 2013-08-20 ENCOUNTER — Telehealth: Payer: Self-pay | Admitting: Family Medicine

## 2013-08-20 NOTE — Telephone Encounter (Signed)
Pt called to let Dr. Lindell Noe know that his prescription cost went down 2/3 with the 90 day supply. He would like a call back when you can. jw

## 2013-08-20 NOTE — Telephone Encounter (Signed)
Will fwd to MD.  Camie Hauss L, CMA  

## 2013-08-20 NOTE — Telephone Encounter (Signed)
Returned patient's call.   He is pleased with reduction in cost of meds with [employer] [facility] website.  He would like to see Dr Valentina Lucks in the office for review of his meds.  Will forward to our admin team to schedule this.  [name], MD

## 2013-08-21 ENCOUNTER — Telehealth: Payer: Self-pay | Admitting: Oncology

## 2013-08-21 ENCOUNTER — Other Ambulatory Visit (HOSPITAL_BASED_OUTPATIENT_CLINIC_OR_DEPARTMENT_OTHER): Payer: Medicare Other

## 2013-08-21 ENCOUNTER — Encounter: Payer: Self-pay | Admitting: Oncology

## 2013-08-21 ENCOUNTER — Encounter: Payer: Self-pay | Admitting: *Deleted

## 2013-08-21 ENCOUNTER — Ambulatory Visit (HOSPITAL_BASED_OUTPATIENT_CLINIC_OR_DEPARTMENT_OTHER): Payer: Medicare Other | Admitting: Oncology

## 2013-08-21 ENCOUNTER — Telehealth: Payer: Self-pay | Admitting: *Deleted

## 2013-08-21 ENCOUNTER — Ambulatory Visit (HOSPITAL_BASED_OUTPATIENT_CLINIC_OR_DEPARTMENT_OTHER): Payer: Medicare Other

## 2013-08-21 VITALS — BP 148/50 | HR 75 | Temp 98.3°F | Resp 20 | Ht 70.0 in | Wt 207.9 lb

## 2013-08-21 DIAGNOSIS — Z5111 Encounter for antineoplastic chemotherapy: Secondary | ICD-10-CM

## 2013-08-21 DIAGNOSIS — C187 Malignant neoplasm of sigmoid colon: Secondary | ICD-10-CM

## 2013-08-21 DIAGNOSIS — R059 Cough, unspecified: Secondary | ICD-10-CM

## 2013-08-21 DIAGNOSIS — C189 Malignant neoplasm of colon, unspecified: Secondary | ICD-10-CM

## 2013-08-21 DIAGNOSIS — R040 Epistaxis: Secondary | ICD-10-CM

## 2013-08-21 DIAGNOSIS — C787 Secondary malignant neoplasm of liver and intrahepatic bile duct: Secondary | ICD-10-CM

## 2013-08-21 DIAGNOSIS — R05 Cough: Secondary | ICD-10-CM

## 2013-08-21 DIAGNOSIS — I509 Heart failure, unspecified: Secondary | ICD-10-CM

## 2013-08-21 DIAGNOSIS — R197 Diarrhea, unspecified: Secondary | ICD-10-CM

## 2013-08-21 DIAGNOSIS — R5381 Other malaise: Secondary | ICD-10-CM

## 2013-08-21 DIAGNOSIS — R5383 Other fatigue: Secondary | ICD-10-CM

## 2013-08-21 LAB — COMPREHENSIVE METABOLIC PANEL (CC13)
ALBUMIN: 2.8 g/dL — AB (ref 3.5–5.0)
ALT: 25 U/L (ref 0–55)
ANION GAP: 10 meq/L (ref 3–11)
AST: 24 U/L (ref 5–34)
Alkaline Phosphatase: 54 U/L (ref 40–150)
BILIRUBIN TOTAL: 0.54 mg/dL (ref 0.20–1.20)
BUN: 27.3 mg/dL — ABNORMAL HIGH (ref 7.0–26.0)
CO2: 26 meq/L (ref 22–29)
Calcium: 9.3 mg/dL (ref 8.4–10.4)
Chloride: 104 mEq/L (ref 98–109)
Creatinine: 1 mg/dL (ref 0.7–1.3)
GLUCOSE: 155 mg/dL — AB (ref 70–140)
POTASSIUM: 3.5 meq/L (ref 3.5–5.1)
SODIUM: 140 meq/L (ref 136–145)
TOTAL PROTEIN: 6.9 g/dL (ref 6.4–8.3)

## 2013-08-21 LAB — CBC WITH DIFFERENTIAL/PLATELET
BASO%: 0.4 % (ref 0.0–2.0)
Basophils Absolute: 0 10*3/uL (ref 0.0–0.1)
EOS%: 6.1 % (ref 0.0–7.0)
Eosinophils Absolute: 0.6 10*3/uL — ABNORMAL HIGH (ref 0.0–0.5)
HEMATOCRIT: 32.2 % — AB (ref 38.4–49.9)
HGB: 10.4 g/dL — ABNORMAL LOW (ref 13.0–17.1)
LYMPH#: 1.2 10*3/uL (ref 0.9–3.3)
LYMPH%: 11.2 % — AB (ref 14.0–49.0)
MCH: 29.7 pg (ref 27.2–33.4)
MCHC: 32.3 g/dL (ref 32.0–36.0)
MCV: 92 fL (ref 79.3–98.0)
MONO#: 0.8 10*3/uL (ref 0.1–0.9)
MONO%: 7.3 % (ref 0.0–14.0)
NEUT#: 7.9 10*3/uL — ABNORMAL HIGH (ref 1.5–6.5)
NEUT%: 75 % (ref 39.0–75.0)
NRBC: 0 % (ref 0–0)
PLATELETS: 198 10*3/uL (ref 140–400)
RBC: 3.5 10*6/uL — ABNORMAL LOW (ref 4.20–5.82)
RDW: 16.7 % — ABNORMAL HIGH (ref 11.0–14.6)
WBC: 10.5 10*3/uL — AB (ref 4.0–10.3)

## 2013-08-21 MED ORDER — DEXAMETHASONE SODIUM PHOSPHATE 20 MG/5ML IJ SOLN
20.0000 mg | Freq: Once | INTRAMUSCULAR | Status: AC
  Administered 2013-08-21: 20 mg via INTRAVENOUS

## 2013-08-21 MED ORDER — ATROPINE SULFATE 1 MG/ML IJ SOLN
INTRAMUSCULAR | Status: AC
  Filled 2013-08-21: qty 1

## 2013-08-21 MED ORDER — ATROPINE SULFATE 1 MG/ML IJ SOLN
0.5000 mg | Freq: Once | INTRAMUSCULAR | Status: AC | PRN
  Administered 2013-08-21: 0.5 mg via INTRAVENOUS

## 2013-08-21 MED ORDER — HEPARIN SOD (PORK) LOCK FLUSH 100 UNIT/ML IV SOLN
500.0000 [IU] | Freq: Once | INTRAVENOUS | Status: DC | PRN
  Filled 2013-08-21: qty 5

## 2013-08-21 MED ORDER — FLUOROURACIL CHEMO INJECTION 2.5 GM/50ML
300.0000 mg/m2 | Freq: Once | INTRAVENOUS | Status: AC
  Administered 2013-08-21: 650 mg via INTRAVENOUS
  Filled 2013-08-21: qty 13

## 2013-08-21 MED ORDER — LIDOCAINE-PRILOCAINE 2.5-2.5 % EX CREA: TOPICAL_CREAM | CUTANEOUS | Status: DC | PRN

## 2013-08-21 MED ORDER — DEXAMETHASONE SODIUM PHOSPHATE 20 MG/5ML IJ SOLN
INTRAMUSCULAR | Status: AC
  Filled 2013-08-21: qty 5

## 2013-08-21 MED ORDER — LEUCOVORIN CALCIUM INJECTION 350 MG
650.0000 mg | Freq: Once | INTRAVENOUS | Status: AC
  Administered 2013-08-21: 650 mg via INTRAVENOUS
  Filled 2013-08-21: qty 32.5

## 2013-08-21 MED ORDER — SODIUM CHLORIDE 0.9 % IJ SOLN
10.0000 mL | INTRAMUSCULAR | Status: DC | PRN
  Filled 2013-08-21: qty 10

## 2013-08-21 MED ORDER — ONDANSETRON 16 MG/50ML IVPB (CHCC)
INTRAVENOUS | Status: AC
  Filled 2013-08-21: qty 16

## 2013-08-21 MED ORDER — SODIUM CHLORIDE 0.9 % IV SOLN
1800.0000 mg/m2 | INTRAVENOUS | Status: DC
  Administered 2013-08-21: 3900 mg via INTRAVENOUS
  Filled 2013-08-21: qty 78

## 2013-08-21 MED ORDER — ONDANSETRON 16 MG/50ML IVPB (CHCC)
16.0000 mg | Freq: Once | INTRAVENOUS | Status: AC
  Administered 2013-08-21: 16 mg via INTRAVENOUS

## 2013-08-21 MED ORDER — IRINOTECAN HCL CHEMO INJECTION 100 MG/5ML
45.0000 mg/m2 | Freq: Once | INTRAVENOUS | Status: AC
  Administered 2013-08-21: 100 mg via INTRAVENOUS
  Filled 2013-08-21: qty 5

## 2013-08-21 MED ORDER — SODIUM CHLORIDE 0.9 % IV SOLN
Freq: Once | INTRAVENOUS | Status: AC
  Administered 2013-08-21: 10:00:00 via INTRAVENOUS

## 2013-08-21 NOTE — Patient Instructions (Signed)
Friday 11 [place] Discharge Instructions for Patients Receiving Chemotherapy  Today you received the following chemotherapy agents irinotecan, leucovorin, adrucil.  To help prevent nausea and vomiting after your treatment, we encourage you to take your nausea medication as needed.    If you develop nausea and vomiting that is not controlled by your nausea medication, call the clinic.   BELOW ARE SYMPTOMS THAT SHOULD BE REPORTED IMMEDIATELY:  *FEVER GREATER THAN 100.5 F  *CHILLS WITH OR WITHOUT FEVER  NAUSEA AND VOMITING THAT IS NOT CONTROLLED WITH YOUR NAUSEA MEDICATION  *UNUSUAL SHORTNESS OF BREATH  *UNUSUAL BRUISING OR BLEEDING  TENDERNESS IN MOUTH AND THROAT WITH OR WITHOUT PRESENCE OF ULCERS  *URINARY PROBLEMS  *BOWEL PROBLEMS  UNUSUAL RASH Items with * indicate a potential emergency and should be followed up as soon as possible.  Feel free to call the clinic should you have any questions or concerns. The clinic phone number is [phone].

## 2013-08-21 NOTE — Progress Notes (Signed)
Chaplain made follow-up visit with pt and his wife in lobby. Pt said he was doing well and had taken a month off of chemo. He stated that the "doctor says I'm feeling better;" he did not comment on how he was actually doing. 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 Chaplain practiced active listening and empathic presence.

## 2013-08-21 NOTE — Telephone Encounter (Signed)
gv and printed aptp sched and avs for pt for Jan adn Feb...s.ed added tx and emailed MW to add tx.

## 2013-08-21 NOTE — Progress Notes (Signed)
Hematology and Oncology Follow Up Visit  Todd [name] [id number] [birth year] 80 y.o. 08/21/2013 8:59 AM Todd [name], MDBreen, Todd Eriksson, MD   Principle Diagnosis: 80 year old with stage IV colon cancer diagnosed in 08/2012. He presented with a colonic mass and liver lesions.  KRAS wild type.   Prior Therapy: He is S/P LAPAROSCOPIC ASSISTED SIGMOID COLON RESECTION, END Left OSTOMY, Hartmann's pouch and acement of power port done on 09/07/2012.   Current therapy:  He started FOLFIRI and Avastin on 3/26. Avastin was stopped after 4 cycles due to epistaxis. He is S/P 8 cycles of treatment last one given on 01/24/2013. He has been on a treatment break for the last 3 months.  FOLFIRI restarted on 10/1 with reduced doses (60% of the CPT 11).  He is here before the next cycle of FOLFIRI restart.   Interim History: [name] presents today for a follow up visit. He is a very nice man with stage IV colon cancer. He under went surgical resection on 09/07/2012 and have done well chemotherapy in general, but after 8 cycles of chemotherapy he has been on a treatment break.  Treatment has been on hold due to accumulation of toxicities. He has felt better after holding the treatment for the last few weeks. His arthritic pain has improved, but developed some side effects associated with prednisone.  His appetite is improving. Has not reported any nausea or vomiting. He has reported some diarrhea and overall weakness. His lower extremity edema has improved. He reports although he is feeling much better and close to his baseline.  Medications: I have reviewed the patient's current medications.  Current Outpatient Prescriptions  Medication Sig Dispense Refill  . aspirin 81 MG tablet Take 81 mg by mouth daily.        . benazepril (LOTENSIN) 10 MG tablet Take 1 tablet (10 mg total) by mouth daily.  90 tablet  3  . chlorhexidine (PERIDEX) 0.12 % solution Use as directed 15 mLs in the mouth or throat 2 (two) times  daily. Use as directed for mouth irritation      . diclofenac (VOLTAREN) 75 MG EC tablet Take 1 tablet (75 mg total) by mouth 2 (two) times daily as needed.  180 tablet  1  . diclofenac sodium (VOLTAREN) 1 % GEL Apply 4 g topically 4 (four) times daily.  100 g  2  . diphenoxylate-atropine (LOMOTIL) 2.5-0.025 MG per tablet Take 2 tablets by mouth 4 (four) times daily as needed for diarrhea or loose stools.  60 tablet  1  . doxazosin (CARDURA) 8 MG tablet Take 0.5 tablets (4 mg total) by mouth 2 (two) times daily.  90 tablet  3  . gabapentin (NEURONTIN) 300 MG capsule Take 600 mg by mouth as needed.       . hydrochlorothiazide (HYDRODIURIL) 25 MG tablet TAKE 1 TABLET BY MOUTH EVERY DAY  90 tablet  3  . isosorbide mononitrate (IMDUR) 60 MG 24 hr tablet TAKE 1 TABLET BY MOUTH EVERY DAY  90 tablet  3  . lidocaine-prilocaine (EMLA) cream Apply topically as needed. Prior to chemotherapy treatment, Apply approx 1/2 teaspoon to skin over port, do not rub in. Cover with plastic,  30 g  1  . metoprolol succinate (TOPROL-XL) 50 MG 24 hr tablet Take 50 mg by mouth daily. Take with or immediately following a meal.      . Multiple Vitamin (MULTIVITAMIN) tablet Take 1 tablet by mouth daily.        Marland Kitchen  mupirocin cream (BACTROBAN) 2 % Apply topically as needed.      . nitroGLYCERIN (NITROSTAT) 0.4 MG SL tablet Place 0.4 mg under the tongue every 5 (five) minutes as needed.        . Nutritional Supplements (ENSURE PO) Take 1 Can by mouth daily.      Todd [name] Glycol-Propyl Glycol (SYSTANE OP) Place 1 drop into both eyes daily.       . predniSONE (STERAPRED UNI-PAK) 10 MG tablet 6 tabs po days 1-2, 5 tabs po days 3-4, 4 tabs po days 5-6, 3 tabs po days 7-8, 2 tabs po days 9-10, 1 tab po days 11-12  42 tablet  0  . prochlorperazine (COMPAZINE) 10 MG tablet Take 1 tablet (10 mg total) by mouth every 6 (six) hours as needed.  30 tablet  1  . Rivaroxaban (XARELTO) 20 MG TABS tablet TAKE 1 TABLET BY MOUTH DAILY  90 tablet   3  . simvastatin (ZOCOR) 40 MG tablet Take 0.5 tablets (20 mg total) by mouth every morning.  90 tablet  3  . sodium chloride (OCEAN) 0.65 % SOLN nasal spray Place 1 spray into the nose as needed for congestion.  1 Bottle  0  . torsemide (DEMADEX) 20 MG tablet Take 1 tablet (20 mg total) by mouth every evening.  90 tablet  3  . VITAMIN E PO Take 1 capsule by mouth at bedtime.      Todd [name] 20 MG TABS tablet TAKE 1 TABLET BY MOUTH DAILY  30 tablet  0  . XARELTO 20 MG TABS tablet TAKE 1 TABLET BY MOUTH DAILY  30 tablet  0   No current facility-administered medications for this visit.    Allergies: No Known Allergies  Past Medical History, Surgical history, Social history, and Family History were reviewed and updated.  Review of Systems:  Remaining ROS negative.  Physical Exam: Blood pressure 148/50, pulse 75, temperature 98.3 F (36.8 C), temperature source Oral, resp. rate 20, height 5' 10"  (1.778 m), weight 207 lb 14.4 oz (94.303 kg). ECOG: 1 General appearance: alert Head: Normocephalic, without obvious abnormality, atraumatic Neck: no adenopathy, no carotid bruit, no JVD, supple, symmetrical, trachea midline and thyroid not enlarged, symmetric, no tenderness/mass/nodules Lymph nodes: Cervical, supraclavicular, and axillary nodes normal. Heart:regular rate and rhythm, S1, S2 normal, no murmur, click, rub or gallop Lung:chest clear, no wheezing, rales, normal symmetric air entry Abdomen: soft, non-tender, without masses or organomegaly EXT:no erythema, induration, or nodules.  1 + edema.    Lab Results: Lab Results  Component Value Date   WBC 10.5* 08/21/2013   HGB 10.4* 08/21/2013   HCT 32.2* 08/21/2013   MCV 92.0 08/21/2013   PLT 198 08/21/2013     Chemistry      Component Value Date/Time   NA 139 08/01/2013 0851   NA 139 04/04/2013 0538   K 3.3* 08/01/2013 0851   K 3.6 04/04/2013 0538   CL 103 04/04/2013 0538   CL 105 01/10/2013 0831   CO2 28 08/01/2013 0851   CO2 27  04/04/2013 0538   BUN 29.8* 08/01/2013 0851   BUN 17 04/04/2013 0538   CREATININE 1.0 08/01/2013 0851   CREATININE 0.94 04/04/2013 0538   CREATININE 0.76 06/27/2012 0856      Component Value Date/Time   CALCIUM 9.4 08/01/2013 0851   CALCIUM 9.1 04/04/2013 0538   ALKPHOS 59 08/01/2013 0851   ALKPHOS 47 04/01/2013 1350   AST 27 08/01/2013 0851   AST 25  04/01/2013 1350   ALT 34 08/01/2013 0851   ALT 16 04/01/2013 1350   BILITOT 0.68 08/01/2013 0851   BILITOT 0.9 04/01/2013 1350         Impression and Plan:  80 year old man with :  1. Stage IV colon cancer. He is S/P sigmoid colon resection with a bulky tumor in the liver. On palliative chemotherapy with FOLFIRI/Avastin. He is S/P cycle 8 cycles. CT scan results from 7/14 (after 8 cycles) showed slight decline in tumor burden.  After 4 cycles of FOLFIRI restart at a lower dose, his CT scan on 06/05/2013  continued to show benefit from chemotherapy both clinically and radiographically.  He is ready to proceed with her next cycle of chemotherapy and he is willing and ready to proceed.  2. Weakness fatigue and tiredness: This is likely related to chemotherapy accumulation and has improved.  3. Congestion/cough and shortness of breath: mostly due to CHF. This has improved dramatically at this time.  4. Epistaxis: improved off Avastin.   5. Diarrhea. Due to Irinotecan. Recommend that he continue Imodium and Lomotil as needed.   6. Follow up: on 09/04/2013 as well as 09/18/2013.   Ido Wollman 1/27/20158:59 AM

## 2013-08-21 NOTE — Telephone Encounter (Signed)
Per staff message and POF I have scheduled appts.  JMW  

## 2013-08-23 ENCOUNTER — Ambulatory Visit: Payer: Medicare Other | Admitting: Family Medicine

## 2013-08-23 ENCOUNTER — Ambulatory Visit (HOSPITAL_BASED_OUTPATIENT_CLINIC_OR_DEPARTMENT_OTHER): Payer: Medicare Other

## 2013-08-23 VITALS — BP 112/51 | HR 60 | Temp 97.6°F

## 2013-08-23 DIAGNOSIS — Z452 Encounter for adjustment and management of vascular access device: Secondary | ICD-10-CM

## 2013-08-23 DIAGNOSIS — C189 Malignant neoplasm of colon, unspecified: Secondary | ICD-10-CM

## 2013-08-23 DIAGNOSIS — C787 Secondary malignant neoplasm of liver and intrahepatic bile duct: Secondary | ICD-10-CM

## 2013-08-23 DIAGNOSIS — C187 Malignant neoplasm of sigmoid colon: Secondary | ICD-10-CM

## 2013-08-23 MED ORDER — SODIUM CHLORIDE 0.9 % IJ SOLN
10.0000 mL | INTRAMUSCULAR | Status: DC | PRN
  Administered 2013-08-23: 10 mL
  Filled 2013-08-23: qty 10

## 2013-08-23 MED ORDER — HEPARIN SOD (PORK) LOCK FLUSH 100 UNIT/ML IV SOLN
500.0000 [IU] | Freq: Once | INTRAVENOUS | Status: AC | PRN
  Administered 2013-08-23: 500 [IU]
  Filled 2013-08-23: qty 5

## 2013-08-23 NOTE — Patient Instructions (Signed)

## 2013-08-27 ENCOUNTER — Encounter: Payer: Self-pay | Admitting: Family Medicine

## 2013-08-27 ENCOUNTER — Ambulatory Visit (INDEPENDENT_AMBULATORY_CARE_PROVIDER_SITE_OTHER): Payer: Medicare Other | Admitting: Family Medicine

## 2013-08-27 VITALS — BP 131/75 | HR 62 | Ht 71.0 in | Wt 199.0 lb

## 2013-08-27 DIAGNOSIS — M79609 Pain in unspecified limb: Secondary | ICD-10-CM

## 2013-08-27 DIAGNOSIS — M79673 Pain in unspecified foot: Secondary | ICD-10-CM

## 2013-08-27 DIAGNOSIS — M25559 Pain in unspecified hip: Secondary | ICD-10-CM

## 2013-08-27 MED ORDER — GABAPENTIN 300 MG PO CAPS: 300.0000 mg | ORAL_CAPSULE | ORAL | Status: DC | PRN

## 2013-08-28 ENCOUNTER — Encounter: Payer: Self-pay | Admitting: Family Medicine

## 2013-08-28 NOTE — Progress Notes (Signed)
Patient ID: Todd [name], male   DOB: 1930/04/27, 80 y.o.   MRN: [id number]  PCP: Willeen Niece, MD  Subjective:   HPI: Patient is a 80 y.o. male here for left heel pain.  12/31: Patient reports he started to get left heel pain about 4-5 days ago. Presenting very similar to gout attacks he's had in the back. Swelling, bad pain, hard to get comfortable. Up to 10/10 now. No change in activity. Recently had a gout flare in hands, knee, right elbow, back. Has tried voltaren gel, heat, oral voltaren, tylenol, advil over past 5 days. Today took some old 30mg  prednisone.  2/2: Patient reports he feels much better. Heel no longer bothering him on the lsft. Finished the prednisone, used heel lifts and was icing. Needs a refill on his gabapentin. Small crack from dryness right heel without redness, drainage.  Past Medical History  Diagnosis Date  . Myocardial infarction 1990    "Massive"- total LAD with collaterals  . Gout   . Hypertension   . Arthritis, rheumatoid   . Diabetes mellitus without complication   . CHF (congestive heart failure)   . BPH (benign prostatic hyperplasia)   . Neuropathy   . Hyperlipidemia   . Ischemic cardiomyopathy March 2012    EF 31% Myoview. Pt declines ICD  . Colon cancer 2014    Surg, chemo March 2014    Current Outpatient Prescriptions on File Prior to Visit  Medication Sig Dispense Refill  . aspirin 81 MG tablet Take 81 mg by mouth daily.        . benazepril (LOTENSIN) 10 MG tablet Take 1 tablet (10 mg total) by mouth daily.  90 tablet  3  . chlorhexidine (PERIDEX) 0.12 % solution Use as directed 15 mLs in the mouth or throat 2 (two) times daily. Use as directed for mouth irritation      . diclofenac (VOLTAREN) 75 MG EC tablet Take 1 tablet (75 mg total) by mouth 2 (two) times daily as needed.  180 tablet  1  . diclofenac sodium (VOLTAREN) 1 % GEL Apply 4 g topically 4 (four) times daily.  100 g  2  . diphenoxylate-atropine (LOMOTIL) 2.5-0.025  MG per tablet Take 2 tablets by mouth 4 (four) times daily as needed for diarrhea or loose stools.  60 tablet  1  . doxazosin (CARDURA) 8 MG tablet Take 0.5 tablets (4 mg total) by mouth 2 (two) times daily.  90 tablet  3  . hydrochlorothiazide (HYDRODIURIL) 25 MG tablet TAKE 1 TABLET BY MOUTH EVERY DAY  90 tablet  3  . isosorbide mononitrate (IMDUR) 60 MG 24 hr tablet TAKE 1 TABLET BY MOUTH EVERY DAY  90 tablet  3  . lidocaine-prilocaine (EMLA) cream Apply topically as needed. Prior to chemotherapy treatment, Apply approx 1/2 teaspoon to skin over port, do not rub in. Cover with plastic,  30 g  1  . metoprolol succinate (TOPROL-XL) 50 MG 24 hr tablet Take 50 mg by mouth daily. Take with or immediately following a meal.      . Multiple Vitamin (MULTIVITAMIN) tablet Take 1 tablet by mouth daily.        . mupirocin cream (BACTROBAN) 2 % Apply topically as needed.      . nitroGLYCERIN (NITROSTAT) 0.4 MG SL tablet Place 0.4 mg under the tongue every 5 (five) minutes as needed.        . Nutritional Supplements (ENSURE PO) Take 1 Can by mouth daily.      Marland Kitchen  Polyethyl Glycol-Propyl Glycol (SYSTANE OP) Place 1 drop into both eyes daily.       . predniSONE (STERAPRED UNI-PAK) 10 MG tablet 6 tabs po days 1-2, 5 tabs po days 3-4, 4 tabs po days 5-6, 3 tabs po days 7-8, 2 tabs po days 9-10, 1 tab po days 11-12  42 tablet  0  . prochlorperazine (COMPAZINE) 10 MG tablet Take 1 tablet (10 mg total) by mouth every 6 (six) hours as needed.  30 tablet  1  . Rivaroxaban (XARELTO) 20 MG TABS tablet TAKE 1 TABLET BY MOUTH DAILY  90 tablet  3  . simvastatin (ZOCOR) 40 MG tablet Take 0.5 tablets (20 mg total) by mouth every morning.  90 tablet  3  . sodium chloride (OCEAN) 0.65 % SOLN nasal spray Place 1 spray into the nose as needed for congestion.  1 Bottle  0  . torsemide (DEMADEX) 20 MG tablet Take 1 tablet (20 mg total) by mouth every evening.  90 tablet  3  . VITAMIN E PO Take 1 capsule by mouth at bedtime.      [name] 20 MG TABS tablet TAKE 1 TABLET BY MOUTH DAILY  30 tablet  0  . XARELTO 20 MG TABS tablet TAKE 1 TABLET BY MOUTH DAILY  30 tablet  0   No current facility-administered medications on file prior to visit.    Past Surgical History  Procedure Laterality Date  . Cataract extraction    . Flexible sigmoidoscopy N/A 09/05/2012    Procedure: FLEXIBLE SIGMOIDOSCOPY;  Surgeon: Missy Sabins, MD;  Location: [facility];  Service: Endoscopy;  Laterality: N/A;  Give 1000 cc tapwater enema on call to endoscopy.  . Colon resection N/A 09/07/2012    Procedure: LAPAROSCOPIC ASSISTED SIGMOID COLON RESECTION END OSTOMY;  Surgeon: Shann Medal, MD;  Location: [facility];  Service: General;  Laterality: N/A;  . Portacath placement N/A 09/07/2012    Procedure: Placement of power port;  Surgeon: Shann Medal, MD;  Location: [facility];  Service: General;  Laterality: N/A;    No Known Allergies  History   Social History  . Marital Status: Married    Spouse Name: N/A    Number of Children: 2  . Years of Education: N/A   Occupational History  . Retired, food and nutritional services    Social History Main Topics  . Smoking status: Former Smoker -- 1.00 packs/day    Quit date: 04/01/2009  . Smokeless tobacco: Never Used     Comment: Quit 45 years ago.  . Alcohol Use: No  . Drug Use: No  . Sexual Activity: Not Currently   Other Topics Concern  . Not on file   Social History Narrative   Emergency Contact:wife, Son Barkan at [place]:   Who lives with you: Lives with wife in 1 story home.   Any pets: does not believe in them   Diet: Patient was the [employer] of food services here at [facility].  Has a varied diet and is currently trying to lose weight by decreasing protein and carbohydrates daily.   Exercise: water aerobix 3x a week for 45 mins and uses row bike 2 times a week for 20 minutes   Seatbelts: wears seatbelt while in vehicles   Sun Exposure/Protection: Patient reports  wearing sun screen   Hobbies: numbers, cruises             Family History  Problem Relation Age of  Onset  . Stroke Father   . Diabetes Father   . Hypertension Father   . Heart attack Father   . Hyperlipidemia Neg Hx   . Sudden death Neg Hx     BP 131/75  Pulse 62  Ht 5\' 11"  (1.803 m)  Wt 199 lb (90.266 kg)  BMI 27.77 kg/m2  Review of Systems: See HPI above.    Objective:  Physical Exam:  Gen: NAD  Left foot/ankle: No swelling, warmth distal achilles.  No bruising, other deformity. FROM without pain. No longer with TTP insertion of achilles, post calcaneus. Negative ant drawer and talar tilt.   Negative syndesmotic compression. Thompsons test negative. NV intact distally.    Assessment & Plan:  1. Left heel pain - Much improved with prednisone for presumed gout flare.  F/u prn.

## 2013-08-29 NOTE — Assessment & Plan Note (Signed)
Much improved with prednisone for presumed gout flare.  F/u prn.

## 2013-08-30 NOTE — Telephone Encounter (Signed)
Note still in my inbox.  Please schedule patient with Dr [facility] for medication review, per patient request. JB

## 2013-08-31 ENCOUNTER — Emergency Department (HOSPITAL_BASED_OUTPATIENT_CLINIC_OR_DEPARTMENT_OTHER): Payer: Medicare Other

## 2013-08-31 ENCOUNTER — Emergency Department (HOSPITAL_BASED_OUTPATIENT_CLINIC_OR_DEPARTMENT_OTHER)
Admission: EM | Admit: 2013-08-31 | Discharge: 2013-08-31 | Disposition: A | Discharge status: Home / Self Care | Payer: Medicare Other | Source: Home / Self Care | Attending: Emergency Medicine | Admitting: Emergency Medicine

## 2013-08-31 ENCOUNTER — Telehealth: Payer: Self-pay | Admitting: *Deleted

## 2013-08-31 ENCOUNTER — Encounter (HOSPITAL_BASED_OUTPATIENT_CLINIC_OR_DEPARTMENT_OTHER): Payer: Self-pay | Admitting: Emergency Medicine

## 2013-08-31 DIAGNOSIS — I10 Essential (primary) hypertension: Secondary | ICD-10-CM | POA: Insufficient documentation

## 2013-08-31 DIAGNOSIS — E785 Hyperlipidemia, unspecified: Secondary | ICD-10-CM | POA: Insufficient documentation

## 2013-08-31 DIAGNOSIS — Z85038 Personal history of other malignant neoplasm of large intestine: Secondary | ICD-10-CM | POA: Insufficient documentation

## 2013-08-31 DIAGNOSIS — Z87891 Personal history of nicotine dependence: Secondary | ICD-10-CM | POA: Insufficient documentation

## 2013-08-31 DIAGNOSIS — I252 Old myocardial infarction: Secondary | ICD-10-CM | POA: Insufficient documentation

## 2013-08-31 DIAGNOSIS — I509 Heart failure, unspecified: Secondary | ICD-10-CM

## 2013-08-31 DIAGNOSIS — Z791 Long term (current) use of non-steroidal anti-inflammatories (NSAID): Secondary | ICD-10-CM | POA: Insufficient documentation

## 2013-08-31 DIAGNOSIS — Z8669 Personal history of other diseases of the nervous system and sense organs: Secondary | ICD-10-CM | POA: Insufficient documentation

## 2013-08-31 DIAGNOSIS — R609 Edema, unspecified: Secondary | ICD-10-CM | POA: Insufficient documentation

## 2013-08-31 DIAGNOSIS — R635 Abnormal weight gain: Secondary | ICD-10-CM | POA: Insufficient documentation

## 2013-08-31 DIAGNOSIS — R0989 Other specified symptoms and signs involving the circulatory and respiratory systems: Secondary | ICD-10-CM | POA: Insufficient documentation

## 2013-08-31 DIAGNOSIS — Z7982 Long term (current) use of aspirin: Secondary | ICD-10-CM | POA: Insufficient documentation

## 2013-08-31 DIAGNOSIS — R0602 Shortness of breath: Secondary | ICD-10-CM | POA: Insufficient documentation

## 2013-08-31 DIAGNOSIS — R040 Epistaxis: Secondary | ICD-10-CM | POA: Insufficient documentation

## 2013-08-31 DIAGNOSIS — M069 Rheumatoid arthritis, unspecified: Secondary | ICD-10-CM | POA: Insufficient documentation

## 2013-08-31 DIAGNOSIS — Z87448 Personal history of other diseases of urinary system: Secondary | ICD-10-CM | POA: Insufficient documentation

## 2013-08-31 DIAGNOSIS — M109 Gout, unspecified: Secondary | ICD-10-CM | POA: Insufficient documentation

## 2013-08-31 DIAGNOSIS — E119 Type 2 diabetes mellitus without complications: Secondary | ICD-10-CM | POA: Insufficient documentation

## 2013-08-31 DIAGNOSIS — Z79899 Other long term (current) drug therapy: Secondary | ICD-10-CM | POA: Insufficient documentation

## 2013-08-31 LAB — CBC WITH DIFFERENTIAL/PLATELET
Basophils Absolute: 0 10*3/uL (ref 0.0–0.1)
Basophils Relative: 1 % (ref 0–1)
Eosinophils Absolute: 0.2 10*3/uL (ref 0.0–0.7)
Eosinophils Relative: 4 % (ref 0–5)
HCT: 29.9 % — ABNORMAL LOW (ref 39.0–52.0)
Hemoglobin: 9.6 g/dL — ABNORMAL LOW (ref 13.0–17.0)
LYMPHS ABS: 0.6 10*3/uL — AB (ref 0.7–4.0)
LYMPHS PCT: 11 % — AB (ref 12–46)
MCH: 29.8 pg (ref 26.0–34.0)
MCHC: 32.1 g/dL (ref 30.0–36.0)
MCV: 92.9 fL (ref 78.0–100.0)
Monocytes Absolute: 0.5 10*3/uL (ref 0.1–1.0)
Monocytes Relative: 8 % (ref 3–12)
NEUTROS PCT: 76 % (ref 43–77)
Neutro Abs: 4.3 10*3/uL (ref 1.7–7.7)
PLATELETS: 229 10*3/uL (ref 150–400)
RBC: 3.22 MIL/uL — AB (ref 4.22–5.81)
RDW: 15.1 % (ref 11.5–15.5)
WBC: 5.6 10*3/uL (ref 4.0–10.5)

## 2013-08-31 LAB — BASIC METABOLIC PANEL
BUN: 34 mg/dL — AB (ref 6–23)
CHLORIDE: 98 meq/L (ref 96–112)
CO2: 25 meq/L (ref 19–32)
Calcium: 8.9 mg/dL (ref 8.4–10.5)
Creatinine, Ser: 1.5 mg/dL — ABNORMAL HIGH (ref 0.50–1.35)
GFR calc Af Amer: 49 mL/min — ABNORMAL LOW (ref >90)
GFR calc non Af Amer: 42 mL/min — ABNORMAL LOW (ref >90)
GLUCOSE: 169 mg/dL — AB (ref 70–99)
POTASSIUM: 4 meq/L (ref 3.7–5.3)
SODIUM: 137 meq/L (ref 137–147)

## 2013-08-31 LAB — PRO B NATRIURETIC PEPTIDE: PRO B NATRI PEPTIDE: 3523 pg/mL — AB (ref 0–450)

## 2013-08-31 LAB — PROTIME-INR
INR: 2.52 — AB (ref 0.00–1.49)
Prothrombin Time: 26.3 seconds — ABNORMAL HIGH (ref 11.6–15.2)

## 2013-08-31 NOTE — Discharge Instructions (Signed)
Heart Failure °Heart failure is a condition in which the heart has trouble pumping blood. This means your heart does not pump blood efficiently for your body to work well. In some cases of heart failure, fluid may back up into your lungs or you may have swelling (edema) in your lower legs. Heart failure is usually a long-term (chronic) condition. It is important for you to take good care of yourself and follow your caregiver's treatment plan. °CAUSES  °Some health conditions can cause heart failure. Those health conditions include: °· High blood pressure (hypertension) causes the heart muscle to work harder than normal. When pressure in the blood vessels is high, the heart needs to pump (contract) with more force in order to circulate blood throughout the body. High blood pressure eventually causes the heart to become stiff and weak. °· Coronary artery disease (CAD) is the buildup of cholesterol and fat (plaque) in the arteries of the heart. The blockage in the arteries deprives the heart muscle of oxygen and blood. This can cause chest pain and may lead to a heart attack. High blood pressure can also contribute to CAD. °· Heart attack (myocardial infarction) occurs when 1 or more arteries in the heart become blocked. The loss of oxygen damages the muscle tissue of the heart. When this happens, part of the heart muscle dies. The injured tissue does not contract as well and weakens the heart's ability to pump blood. °· Abnormal heart valves can cause heart failure when the heart valves do not open and close properly. This makes the heart muscle pump harder to keep the blood flowing. °· Heart muscle disease (cardiomyopathy or myocarditis) is damage to the heart muscle from a variety of causes. These can include drug or alcohol abuse, infections, or unknown reasons. These can increase the risk of heart failure. °· Lung disease makes the heart work harder because the lungs do not work properly. This can cause a strain  on the heart, leading it to fail. °· Diabetes increases the risk of heart failure. High blood sugar contributes to high fat (lipid) levels in the blood. Diabetes can also cause slow damage to tiny blood vessels that carry important nutrients to the heart muscle. When the heart does not get enough oxygen and food, it can cause the heart to become weak and stiff. This leads to a heart that does not contract efficiently. °· Other conditions can contribute to heart failure. These include abnormal heart rhythms, thyroid problems, and low blood counts (anemia). °Certain unhealthy behaviors can increase the risk of heart failure. Those unhealthy behaviors include: °· Being overweight. °· Smoking or chewing tobacco. °· Eating foods high in fat and cholesterol. °· Abusing illicit drugs or alcohol. °· Lacking physical activity. °SYMPTOMS  °Heart failure symptoms may vary and can be hard to detect. Symptoms may include: °· Shortness of breath with activity, such as climbing stairs. °· Persistent cough. °· Swelling of the feet, ankles, legs, or abdomen. °· Unexplained weight gain. °· Difficulty breathing when lying flat (orthopnea). °· Waking from sleep because of the need to sit up and get more air. °· Rapid heartbeat. °· Fatigue and loss of energy. °· Feeling lightheaded, dizzy, or close to fainting. °· Loss of appetite. °· Nausea. °· Increased urination during the night (nocturia). °DIAGNOSIS  °A diagnosis of heart failure is based on your history, symptoms, physical examination, and diagnostic tests. °Diagnostic tests for heart failure may include: °· Echocardiography. °· Electrocardiography. °· Chest X-ray. °· Blood tests. °· Exercise   stress test. °· Cardiac angiography. °· Radionuclide scans. °TREATMENT  °Treatment is aimed at managing the symptoms of heart failure. Medicines, behavioral changes, or surgical intervention may be necessary to treat heart failure. °· Medicines to help treat heart failure may  include: °· Angiotensin-converting enzyme (ACE) inhibitors. This type of medicine blocks the effects of a blood protein called angiotensin-converting enzyme. ACE inhibitors relax (dilate) the blood vessels and help lower blood pressure. °· Angiotensin receptor blockers. This type of medicine blocks the actions of a blood protein called angiotensin. Angiotensin receptor blockers dilate the blood vessels and help lower blood pressure. °· Water pills (diuretics). Diuretics cause the kidneys to remove salt and water from the blood. The extra fluid is removed through urination. This loss of extra fluid lowers the volume of blood the heart pumps. °· Beta blockers. These prevent the heart from beating too fast and improve heart muscle strength. °· Digitalis. This increases the force of the heartbeat. °· Healthy behavior changes include: °· Obtaining and maintaining a healthy weight. °· Stopping smoking or chewing tobacco. °· Eating heart healthy foods. °· Limiting or avoiding alcohol. °· Stopping illicit drug use. °· Physical activity as directed by your caregiver. °· Surgical treatment for heart failure may include: °· A procedure to open blocked arteries, repair damaged heart valves, or remove damaged heart muscle tissue. °· A pacemaker to improve heart muscle function and control certain abnormal heart rhythms. °· An internal cardioverter defibrillator to treat certain serious abnormal heart rhythms. °· A left ventricular assist device to assist the pumping ability of the heart. °HOME CARE INSTRUCTIONS  °· Take your medicine as directed by your caregiver. Medicines are important in reducing the workload of your heart, slowing the progression of heart failure, and improving your symptoms. °· Do not stop taking your medicine unless directed by your caregiver. °· Do not skip any dose of medicine. °· Refill your prescriptions before you run out of medicine. Your medicines are needed every day. °· Take over-the-counter  medicine only as directed by your caregiver or pharmacist. °· Engage in moderate physical activity if directed by your caregiver. Moderate physical activity can benefit some people. The elderly and people with severe heart failure should consult with a caregiver for physical activity recommendations. °· Eat heart healthy foods. Food choices should be free of trans fat and low in saturated fat, cholesterol, and salt (sodium). Healthy choices include fresh or frozen fruits and vegetables, fish, lean meats, legumes, fat-free or low-fat dairy products, and whole grain or high fiber foods. Talk to a dietitian to learn more about heart healthy foods. °· Limit sodium if directed by your caregiver. Sodium restriction may reduce symptoms of heart failure in some people. Talk to a dietitian to learn more about heart healthy seasonings. °· Use healthy cooking methods. Healthy cooking methods include roasting, grilling, broiling, baking, poaching, steaming, or stir-frying. Talk to a dietitian to learn more about healthy cooking methods. °· Limit fluids if directed by your caregiver. Fluid restriction may reduce symptoms of heart failure in some people. °· Weigh yourself every day. Daily weights are important in the early recognition of excess fluid. You should weigh yourself every morning after you urinate and before you eat breakfast. Wear the same amount of clothing each time you weigh yourself. Record your daily weight. Provide your caregiver with your weight record. °· Monitor and record your blood pressure if directed by your caregiver. °· Check your pulse if directed by your caregiver. °· Lose weight if directed   by your caregiver. Weight loss may reduce symptoms of heart failure in some people.  Stop smoking or chewing tobacco. Nicotine makes your heart work harder by causing your blood vessels to constrict. Do not use nicotine gum or patches before talking to your caregiver.  Schedule and attend follow-up visits as  directed by your caregiver. It is important to keep all your appointments.  Limit alcohol intake to no more than 1 drink per day for nonpregnant women and 2 drinks per day for men. Drinking more than that is harmful to your heart. Tell your caregiver if you drink alcohol several times a week. Talk with your caregiver about whether alcohol is safe for you. If your heart has already been damaged by alcohol or you have severe heart failure, drinking alcohol should be stopped completely.  Stop illicit drug use.  Stay up-to-date with immunizations. It is especially important to prevent respiratory infections through current pneumococcal and influenza immunizations.  Manage other health conditions such as hypertension, diabetes, thyroid disease, or abnormal heart rhythms as directed by your caregiver.  Learn to manage stress.  Plan rest periods when fatigued.  Learn strategies to manage high temperatures. If the weather is extremely hot:  Avoid vigorous physical activity.  Use air conditioning or fans or seek a cooler location.  Avoid caffeine and alcohol.  Wear loose-fitting, lightweight, and light-colored clothing.  Learn strategies to manage cold temperatures. If the weather is extremely cold:  Avoid vigorous physical activity.  Layer clothes.  Wear mittens or gloves, a hat, and a scarf when going outside.  Avoid alcohol.  Obtain ongoing education and support as needed.  Participate or seek rehabilitation as needed to maintain or improve independence and quality of life. SEEK MEDICAL CARE IF:   Your weight increases by 03 lb/1.4 kg in 1 day or 05 lb/2.3 kg in a week.  You have increasing shortness of breath that is unusual for you.  You are unable to participate in your usual physical activities.  You tire easily.  You cough more than normal, especially with physical activity.  You have any or more swelling in areas such as your hands, feet, ankles, or abdomen.  You  are unable to sleep because it is hard to breathe.  You feel like your heart is beating fast (palpitations).  You become dizzy or lightheaded upon standing up. SEEK IMMEDIATE MEDICAL CARE IF:   You have difficulty breathing.  There is a change in mental status such as decreased alertness or difficulty with concentration.  You have a pain or discomfort in your chest.  You have an episode of fainting (syncope). MAKE SURE YOU:   Understand these instructions.  Will watch your condition.  Will get help right away if you are not doing well or get worse. Document Released: 07/12/2005 Document Revised: 11/06/2012 Document Reviewed: 08/03/2012 [facility] Patient Information 2014 [place], [place]. Nosebleed Nosebleeds can be caused by many conditions including trauma, infections, polyps, foreign bodies, dry mucous membranes or climate, medications and air conditioning. Most nosebleeds occur in the front of the nose. It is because of this location that most nosebleeds can be controlled by pinching the nostrils gently and continuously. Do this for at least 10 to 20 minutes. The reason for this long continuous pressure is that you must hold it long enough for the blood to clot. If during that 10 to 20 minute time period, pressure is released, the process may have to be started again. The nosebleed may stop by itself, quit  with pressure, need concentrated heating (cautery) or stop with pressure from packing. HOME CARE INSTRUCTIONS   If your nose was packed, try to maintain the pack inside until your caregiver removes it. If a gauze pack was used and it starts to fall out, gently replace or cut the end off. Do not cut if a balloon catheter was used to pack the nose. Otherwise, do not remove unless instructed.  Avoid blowing your nose for 12 hours after treatment. This could dislodge the pack or clot and start bleeding again.  If the bleeding starts again, sit up and bending forward, gently pinch the  front half of your nose continuously for 20 minutes.  If bleeding was caused by dry mucous membranes, cover the inside of your nose every morning with a petroleum or antibiotic ointment. Use your little fingertip as an applicator. Do this as needed during dry weather. This will keep the mucous membranes moist and allow them to heal.  Maintain humidity in your home by using less air conditioning or using a humidifier.  Do not use aspirin or medications which make bleeding more likely. Your caregiver can give you recommendations on this.  Resume normal activities as able but try to avoid straining, lifting or bending at the waist for several days.  If the nosebleeds become recurrent and the cause is unknown, your caregiver may suggest laboratory tests. SEEK IMMEDIATE MEDICAL CARE IF:   Bleeding recurs and cannot be controlled.  There is unusual bleeding from or bruising on other parts of the body.  You have a fever.  Nosebleeds continue.  There is any worsening of the condition which originally brought you in.  You become lightheaded, feel faint, become sweaty or vomit blood. MAKE SURE YOU:   Understand these instructions.  Will watch your condition.  Will get help right away if you are not doing well or get worse. Document Released: 04/21/2005 Document Revised: 10/04/2011 Document Reviewed: 06/13/2009 [facility] Patient Information 2014 [place], [place].

## 2013-08-31 NOTE — ED Notes (Signed)
Pt reports a nosebleed that started this am and has intermittently continued throughout the day. He has a hx af frequent nosebleeds, but today it bleed a little longer. Dried blood noted in left nare.  He also reports a 1 lb weight gain this am, SHOB, and increased diuretic as directed.

## 2013-08-31 NOTE — ED Notes (Signed)
MD at bedside. 

## 2013-08-31 NOTE — ED Provider Notes (Signed)
CSN: [id number]     Arrival date & time 08/31/13  1657 History   First MD Initiated Contact with Patient 08/31/13 1710     Chief Complaint  Patient presents with  . Epistaxis   (Consider location/radiation/quality/duration/timing/severity/associated sxs/prior Treatment) Patient is a 80 y.o. male presenting with nosebleeds and shortness of breath. The history is provided by the patient and a relative.  Epistaxis Location:  L nare Severity:  Moderate Duration:  12 hours Timing:  Constant Progression:  Resolved Chronicity:  Recurrent Context: anticoagulants   Relieved by: packing. Worsened by:  Nothing tried Ineffective treatments:  None tried Associated symptoms: blood in oropharynx and congestion   Associated symptoms: no cough, no dizziness, no facial pain, no fever, no headaches, no sinus pain, no sneezing, no sore throat and no syncope   Congestion:    Location:  Chest   Interferes with sleep: no   Risk factors: frequent nosebleeds and recent chemotherapy   Risk factors: no recent nasal surgery   Shortness of Breath Severity:  Mild Duration:  2 days Timing:  Intermittent Progression:  Waxing and waning Chronicity:  New Context: activity   Relieved by:  Rest Worsened by:  Exertion and movement Ineffective treatments:  None tried Associated symptoms: no abdominal pain, no chest pain, no cough, no fever, no headaches, no rash, no sore throat, no syncope and no vomiting   Associated symptoms comment:  Chest congestion, weight gain, inc LE edema  Risk factors comment:  Hx CHF   Past Medical History  Diagnosis Date  . Myocardial infarction 1990    "Massive"- total LAD with collaterals  . Gout   . Hypertension   . Arthritis, rheumatoid   . Diabetes mellitus without complication   . CHF (congestive heart failure)   . BPH (benign prostatic hyperplasia)   . Neuropathy   . Hyperlipidemia   . Ischemic cardiomyopathy March 2012    EF 31% Myoview. Pt declines ICD  . Colon  cancer 2014    Surg, chemo March 2014   Past Surgical History  Procedure Laterality Date  . Cataract extraction    . Flexible sigmoidoscopy N/A 09/05/2012    Procedure: FLEXIBLE SIGMOIDOSCOPY;  Surgeon: Missy Sabins, MD;  Location: [place];  Service: Endoscopy;  Laterality: N/A;  Give 1000 cc tapwater enema on call to endoscopy.  . Colon resection N/A 09/07/2012    Procedure: LAPAROSCOPIC ASSISTED SIGMOID COLON RESECTION END OSTOMY;  Surgeon: Shann Medal, MD;  Location: [place];  Service: General;  Laterality: N/A;  . Portacath placement N/A 09/07/2012    Procedure: Placement of power port;  Surgeon: Shann Medal, MD;  Location: [place];  Service: General;  Laterality: N/A;   Family History  Problem Relation Age of Onset  . Stroke Father   . Diabetes Father   . Hypertension Father   . Heart attack Father   . Hyperlipidemia Neg Hx   . Sudden death Neg Hx    History  Substance Use Topics  . Smoking status: Former Smoker -- 1.00 packs/day    Quit date: 04/01/2009  . Smokeless tobacco: Never Used     Comment: Quit 45 years ago.  . Alcohol Use: No    Review of Systems  Constitutional: Negative for fever, activity change, appetite change and fatigue.  HENT: Positive for congestion and nosebleeds. Negative for facial swelling, rhinorrhea, sneezing, sore throat and trouble swallowing.   Eyes: Negative for photophobia and pain.  Respiratory: Positive for shortness of breath. Negative  for cough and chest tightness.   Cardiovascular: Negative for chest pain, leg swelling and syncope.  Gastrointestinal: Negative for nausea, vomiting, abdominal pain, diarrhea and constipation.  Endocrine: Negative for polydipsia and polyuria.  Genitourinary: Negative for dysuria, urgency, decreased urine volume and difficulty urinating.  Musculoskeletal: Negative for back pain and gait problem.  Skin: Negative for color change, rash and wound.  Allergic/Immunologic: Negative for immunocompromised  state.  Neurological: Negative for dizziness, facial asymmetry, speech difficulty, weakness, numbness and headaches.  Psychiatric/Behavioral: Negative for confusion, decreased concentration and agitation.    Allergies  Review of patient's allergies indicates no known allergies.  Home Medications   Current Outpatient Rx  Name  Route  Sig  Dispense  Refill  . aspirin 81 MG tablet   Oral   Take 81 mg by mouth daily.           . benazepril (LOTENSIN) 10 MG tablet   Oral   Take 1 tablet (10 mg total) by mouth daily.   90 tablet   3   . chlorhexidine (PERIDEX) 0.12 % solution   Mouth/Throat   Use as directed 15 mLs in the mouth or throat 2 (two) times daily. Use as directed for mouth irritation         . diclofenac (VOLTAREN) 75 MG EC tablet   Oral   Take 1 tablet (75 mg total) by mouth 2 (two) times daily as needed.   180 tablet   1     **Patient requests 90 days supply**   . diclofenac sodium (VOLTAREN) 1 % GEL   Topical   Apply 4 g topically 4 (four) times daily.   100 g   2   . diphenoxylate-atropine (LOMOTIL) 2.5-0.025 MG per tablet   Oral   Take 2 tablets by mouth 4 (four) times daily as needed for diarrhea or loose stools.   60 tablet   1   . doxazosin (CARDURA) 8 MG tablet   Oral   Take 0.5 tablets (4 mg total) by mouth 2 (two) times daily.   90 tablet   3   . gabapentin (NEURONTIN) 300 MG capsule   Oral   Take 1 capsule (300 mg total) by mouth as needed.   30 capsule   5   . hydrochlorothiazide (HYDRODIURIL) 25 MG tablet      TAKE 1 TABLET BY MOUTH EVERY DAY   90 tablet   3   . isosorbide mononitrate (IMDUR) 60 MG 24 hr tablet      TAKE 1 TABLET BY MOUTH EVERY DAY   90 tablet   3   . lidocaine-prilocaine (EMLA) cream   Topical   Apply topically as needed. Prior to chemotherapy treatment, Apply approx 1/2 teaspoon to skin over port, do not rub in. Cover with plastic,   30 g   1   . metoprolol succinate (TOPROL-XL) 50 MG 24 hr tablet    Oral   Take 50 mg by mouth daily. Take with or immediately following a meal.         . Multiple Vitamin (MULTIVITAMIN) tablet   Oral   Take 1 tablet by mouth daily.           . mupirocin cream (BACTROBAN) 2 %   Topical   Apply topically as needed.         . nitroGLYCERIN (NITROSTAT) 0.4 MG SL tablet   Sublingual   Place 0.4 mg under the tongue every 5 (five) minutes as needed.           Marland Kitchen  Nutritional Supplements (ENSURE PO)   Oral   Take 1 Can by mouth daily.         [name] Glycol-Propyl Glycol (SYSTANE OP)   Both Eyes   Place 1 drop into both eyes daily.          . predniSONE (STERAPRED UNI-PAK) 10 MG tablet      6 tabs po days 1-2, 5 tabs po days 3-4, 4 tabs po days 5-6, 3 tabs po days 7-8, 2 tabs po days 9-10, 1 tab po days 11-12   42 tablet   0   . prochlorperazine (COMPAZINE) 10 MG tablet   Oral   Take 1 tablet (10 mg total) by mouth every 6 (six) hours as needed.   30 tablet   1     For nausea and vomiting   . Rivaroxaban (XARELTO) 20 MG TABS tablet      TAKE 1 TABLET BY MOUTH DAILY   90 tablet   3   . simvastatin (ZOCOR) 40 MG tablet   Oral   Take 0.5 tablets (20 mg total) by mouth every morning.   90 tablet   3   . sodium chloride (OCEAN) 0.65 % SOLN nasal spray   Nasal   Place 1 spray into the nose as needed for congestion.   1 Bottle   0   . torsemide (DEMADEX) 20 MG tablet   Oral   Take 1 tablet (20 mg total) by mouth every evening.   90 tablet   3   . VITAMIN E PO   Oral   Take 1 capsule by mouth at bedtime.         [name] 20 MG TABS tablet      TAKE 1 TABLET BY MOUTH DAILY   30 tablet   0   . XARELTO 20 MG TABS tablet      TAKE 1 TABLET BY MOUTH DAILY   30 tablet   0    BP 138/60  Pulse 65  Temp(Src) 98.8 F (37.1 C) (Oral)  Resp 20  Ht 5\' 10"  (1.778 m)  Wt 202 lb (91.627 kg)  BMI 28.98 kg/m2  SpO2 96% Physical Exam  Constitutional: He is oriented to person, place, and time. He appears  well-developed and well-nourished. No distress.  HENT:  Head: Normocephalic and atraumatic.  Mouth/Throat: No oropharyngeal exudate.  Dried blood in BL nares  Eyes: Pupils are equal, round, and reactive to light.  Neck: Normal range of motion. Neck supple.  Cardiovascular: Normal rate, regular rhythm and normal heart sounds.  Exam reveals no gallop and no friction rub.   No murmur heard. Pulmonary/Chest: Effort normal. No respiratory distress. He has no wheezes. He has rhonchi in the right lower field and the left lower field. He has no rales.  Abdominal: Soft. Bowel sounds are normal. He exhibits no distension and no mass. There is no tenderness. There is no rebound and no guarding.    Musculoskeletal: Normal range of motion. He exhibits no edema and no tenderness.  Neurological: He is alert and oriented to person, place, and time.  Skin: Skin is warm and dry.  Psychiatric: He has a normal mood and affect.    ED Course  Procedures (including critical care time) Labs Review Labs Reviewed  CBC WITH DIFFERENTIAL - Abnormal; Notable for the following:    RBC 3.22 (*)    Hemoglobin 9.6 (*)    HCT 29.9 (*)    Lymphocytes Relative 11 (*)  Lymphs Abs 0.6 (*)    All other components within normal limits  BASIC METABOLIC PANEL - Abnormal; Notable for the following:    Glucose, Bld 169 (*)    BUN 34 (*)    Creatinine, Ser 1.50 (*)    GFR calc non Af Amer 42 (*)    GFR calc Af Amer 49 (*)    All other components within normal limits  PROTIME-INR - Abnormal; Notable for the following:    Prothrombin Time 26.3 (*)    INR 2.52 (*)    All other components within normal limits  PRO B NATRIURETIC PEPTIDE - Abnormal; Notable for the following:    Pro B Natriuretic peptide (BNP) 3523.0 (*)    All other components within normal limits   Imaging Review Dg Chest 2 View  08/31/2013   CLINICAL DATA:  Shortness of breath, epistaxis.  EXAM: CHEST  2 VIEW  COMPARISON:  CT ANGIO CHEST W/CM &/OR  WO/CM dated 04/02/2013; DG CHEST 2 VIEW dated 04/01/2013  FINDINGS: Right Port-A-Cath remains in place, unchanged. Heart is borderline in size. Bibasilar atelectasis, best seen on the lateral view. No effusions. No overt edema. No acute bony abnormality.  IMPRESSION: Bibasilar atelectasis.   Electronically Signed   By: Rolm Baptise M.D.   On: 08/31/2013 17:57    EKG Interpretation   None       MDM   1. Epistaxis, recurrent   2. CHF (congestive heart failure)    Pt is a 80 y.o. male with Pmhx as above including CHF & colon cancer who presents with L sided epistaxis for about 12 hrs, resolved just PTA.  On Xarelto.  He also reports 2-3 days of inc wt gain about 1lb per day, chest congestion, SOB w/ exertion, and inc BLLE edema.  No fever, CP, sick contacts.  On PE, VSS, pt in NAD.  No active nose bleeding.  Coarse breath sounds BL.  Abdominal exam benign (ostomy in place).  W/U shows about 1g Hb drop since 1/27, but still around his base range for past 3 months.  BNP elevated from prior at 3500, but CXR shows no overt edema.  As pt has no O2 requirement, is well appearing, and nose has not had rebleeding while in ED, I feel he is safe for d/c.  As planned w/ his cardiologist, pt increased his torsemide dose to 40 mg until weight improved.  Return precautions given for new or worsening symptoms including CP, worsening SOB, leg swelling, rebleeding.          Neta Ehlers, MD 09/01/13 [phone]

## 2013-08-31 NOTE — ED Notes (Signed)
MD at bedside to update pt and family in results of testing.

## 2013-08-31 NOTE — Telephone Encounter (Signed)
Patient calling to say he has had a nose bleed since 5:00 am this morning. Has also taken his xaralto today. Per dr Alen Blew, he is to report to the emergency room. Patient verbalizes understanding.

## 2013-08-31 NOTE — ED Notes (Signed)
Sent to ED by Oncologist for Nosebleed since 0500 this morning.  On Xarelto.

## 2013-09-02 ENCOUNTER — Emergency Department (HOSPITAL_BASED_OUTPATIENT_CLINIC_OR_DEPARTMENT_OTHER): Payer: Medicare Other

## 2013-09-02 ENCOUNTER — Inpatient Hospital Stay (HOSPITAL_BASED_OUTPATIENT_CLINIC_OR_DEPARTMENT_OTHER)
Admission: EM | Admit: 2013-09-02 | Discharge: 2013-09-05 | DRG: 194 | Disposition: A | Discharge status: Home Health | Payer: Medicare Other | Attending: Family Medicine | Admitting: Family Medicine

## 2013-09-02 ENCOUNTER — Encounter (HOSPITAL_BASED_OUTPATIENT_CLINIC_OR_DEPARTMENT_OTHER): Payer: Self-pay | Admitting: Emergency Medicine

## 2013-09-02 DIAGNOSIS — D649 Anemia, unspecified: Secondary | ICD-10-CM | POA: Diagnosis present

## 2013-09-02 DIAGNOSIS — Z9861 Coronary angioplasty status: Secondary | ICD-10-CM

## 2013-09-02 DIAGNOSIS — I252 Old myocardial infarction: Secondary | ICD-10-CM

## 2013-09-02 DIAGNOSIS — R0600 Dyspnea, unspecified: Secondary | ICD-10-CM

## 2013-09-02 DIAGNOSIS — D6869 Other thrombophilia: Secondary | ICD-10-CM | POA: Diagnosis present

## 2013-09-02 DIAGNOSIS — I2699 Other pulmonary embolism without acute cor pulmonale: Secondary | ICD-10-CM

## 2013-09-02 DIAGNOSIS — R040 Epistaxis: Secondary | ICD-10-CM

## 2013-09-02 DIAGNOSIS — C787 Secondary malignant neoplasm of liver and intrahepatic bile duct: Secondary | ICD-10-CM | POA: Diagnosis present

## 2013-09-02 DIAGNOSIS — Z7982 Long term (current) use of aspirin: Secondary | ICD-10-CM

## 2013-09-02 DIAGNOSIS — I129 Hypertensive chronic kidney disease with stage 1 through stage 4 chronic kidney disease, or unspecified chronic kidney disease: Secondary | ICD-10-CM | POA: Diagnosis present

## 2013-09-02 DIAGNOSIS — C189 Malignant neoplasm of colon, unspecified: Secondary | ICD-10-CM | POA: Diagnosis present

## 2013-09-02 DIAGNOSIS — N179 Acute kidney failure, unspecified: Secondary | ICD-10-CM

## 2013-09-02 DIAGNOSIS — I10 Essential (primary) hypertension: Secondary | ICD-10-CM | POA: Diagnosis present

## 2013-09-02 DIAGNOSIS — I255 Ischemic cardiomyopathy: Secondary | ICD-10-CM

## 2013-09-02 DIAGNOSIS — I509 Heart failure, unspecified: Secondary | ICD-10-CM

## 2013-09-02 DIAGNOSIS — E785 Hyperlipidemia, unspecified: Secondary | ICD-10-CM | POA: Diagnosis present

## 2013-09-02 DIAGNOSIS — I5189 Other ill-defined heart diseases: Secondary | ICD-10-CM

## 2013-09-02 DIAGNOSIS — Z87891 Personal history of nicotine dependence: Secondary | ICD-10-CM

## 2013-09-02 DIAGNOSIS — Z66 Do not resuscitate: Secondary | ICD-10-CM | POA: Diagnosis present

## 2013-09-02 DIAGNOSIS — E669 Obesity, unspecified: Secondary | ICD-10-CM | POA: Diagnosis present

## 2013-09-02 DIAGNOSIS — Z79899 Other long term (current) drug therapy: Secondary | ICD-10-CM

## 2013-09-02 DIAGNOSIS — R011 Cardiac murmur, unspecified: Secondary | ICD-10-CM

## 2013-09-02 DIAGNOSIS — R42 Dizziness and giddiness: Secondary | ICD-10-CM

## 2013-09-02 DIAGNOSIS — I5043 Acute on chronic combined systolic (congestive) and diastolic (congestive) heart failure: Secondary | ICD-10-CM | POA: Diagnosis present

## 2013-09-02 DIAGNOSIS — I2589 Other forms of chronic ischemic heart disease: Secondary | ICD-10-CM | POA: Diagnosis present

## 2013-09-02 DIAGNOSIS — I2510 Atherosclerotic heart disease of native coronary artery without angina pectoris: Secondary | ICD-10-CM | POA: Diagnosis present

## 2013-09-02 DIAGNOSIS — Z9221 Personal history of antineoplastic chemotherapy: Secondary | ICD-10-CM

## 2013-09-02 DIAGNOSIS — Z7901 Long term (current) use of anticoagulants: Secondary | ICD-10-CM

## 2013-09-02 DIAGNOSIS — N183 Chronic kidney disease, stage 3 unspecified: Secondary | ICD-10-CM | POA: Diagnosis present

## 2013-09-02 DIAGNOSIS — M109 Gout, unspecified: Secondary | ICD-10-CM | POA: Diagnosis present

## 2013-09-02 DIAGNOSIS — I219 Acute myocardial infarction, unspecified: Secondary | ICD-10-CM

## 2013-09-02 DIAGNOSIS — I2782 Chronic pulmonary embolism: Secondary | ICD-10-CM

## 2013-09-02 DIAGNOSIS — M069 Rheumatoid arthritis, unspecified: Secondary | ICD-10-CM | POA: Diagnosis present

## 2013-09-02 DIAGNOSIS — I5042 Chronic combined systolic (congestive) and diastolic (congestive) heart failure: Secondary | ICD-10-CM | POA: Diagnosis present

## 2013-09-02 DIAGNOSIS — J189 Pneumonia, unspecified organism: Principal | ICD-10-CM | POA: Diagnosis present

## 2013-09-02 DIAGNOSIS — E119 Type 2 diabetes mellitus without complications: Secondary | ICD-10-CM | POA: Diagnosis present

## 2013-09-02 LAB — BASIC METABOLIC PANEL
BUN: 31 mg/dL — ABNORMAL HIGH (ref 6–23)
CALCIUM: 8.8 mg/dL (ref 8.4–10.5)
CO2: 25 meq/L (ref 19–32)
Chloride: 97 mEq/L (ref 96–112)
Creatinine, Ser: 1.5 mg/dL — ABNORMAL HIGH (ref 0.50–1.35)
GFR calc Af Amer: 49 mL/min — ABNORMAL LOW (ref >90)
GFR calc non Af Amer: 42 mL/min — ABNORMAL LOW (ref >90)
GLUCOSE: 161 mg/dL — AB (ref 70–99)
POTASSIUM: 3.6 meq/L — AB (ref 3.7–5.3)
Sodium: 138 mEq/L (ref 137–147)

## 2013-09-02 LAB — PRO B NATRIURETIC PEPTIDE: PRO B NATRI PEPTIDE: 9281 pg/mL — AB (ref 0–450)

## 2013-09-02 LAB — INFLUENZA PANEL BY PCR (TYPE A & B)
H1N1FLUPCR: NOT DETECTED
INFLAPCR: NEGATIVE
Influenza B By PCR: NEGATIVE

## 2013-09-02 LAB — CBC
HCT: 27.6 % — ABNORMAL LOW (ref 39.0–52.0)
HEMOGLOBIN: 9 g/dL — AB (ref 13.0–17.0)
MCH: 30.1 pg (ref 26.0–34.0)
MCHC: 32.6 g/dL (ref 30.0–36.0)
MCV: 92.3 fL (ref 78.0–100.0)
PLATELETS: 213 10*3/uL (ref 150–400)
RBC: 2.99 MIL/uL — AB (ref 4.22–5.81)
RDW: 15.1 % (ref 11.5–15.5)
WBC: 6.1 10*3/uL (ref 4.0–10.5)

## 2013-09-02 LAB — STREP PNEUMONIAE URINARY ANTIGEN: STREP PNEUMO URINARY ANTIGEN: NEGATIVE

## 2013-09-02 LAB — CG4 I-STAT (LACTIC ACID): Lactic Acid, Venous: 1.05 mmol/L (ref 0.5–2.2)

## 2013-09-02 MED ORDER — DEXTROSE 5 % IV SOLN
1.0000 g | Freq: Two times a day (BID) | INTRAVENOUS | Status: DC
  Administered 2013-09-02 – 2013-09-04 (×4): 1 g via INTRAVENOUS
  Filled 2013-09-02 (×5): qty 1

## 2013-09-02 MED ORDER — SIMVASTATIN 20 MG PO TABS
20.0000 mg | ORAL_TABLET | Freq: Every day | ORAL | Status: DC
Start: 2013-09-02 — End: 2013-09-05
  Administered 2013-09-02 – 2013-09-04 (×3): 20 mg via ORAL
  Filled 2013-09-02 (×4): qty 1

## 2013-09-02 MED ORDER — DEXTROSE 5 % IV SOLN
1.0000 g | Freq: Once | INTRAVENOUS | Status: AC
  Administered 2013-09-02: 1 g via INTRAVENOUS

## 2013-09-02 MED ORDER — SODIUM CHLORIDE 0.9 % IJ SOLN: 3.0000 mL | INTRAMUSCULAR | Status: DC | PRN

## 2013-09-02 MED ORDER — CEFTRIAXONE SODIUM 1 G IJ SOLR
INTRAMUSCULAR | Status: AC
  Filled 2013-09-02: qty 10

## 2013-09-02 MED ORDER — DOXAZOSIN MESYLATE 4 MG PO TABS
4.0000 mg | ORAL_TABLET | Freq: Two times a day (BID) | ORAL | Status: DC
  Filled 2013-09-02: qty 1

## 2013-09-02 MED ORDER — AZITHROMYCIN 250 MG PO TABS
500.0000 mg | ORAL_TABLET | Freq: Once | ORAL | Status: AC
  Administered 2013-09-02: 500 mg via ORAL
  Filled 2013-09-02: qty 2

## 2013-09-02 MED ORDER — BLISTEX MEDICATED EX OINT
TOPICAL_OINTMENT | CUTANEOUS | Status: DC | PRN
  Administered 2013-09-02: 18:00:00 via TOPICAL
  Filled 2013-09-02: qty 10

## 2013-09-02 MED ORDER — ASPIRIN 81 MG PO TABS: 81.0000 mg | ORAL_TABLET | Freq: Every day | ORAL | Status: DC

## 2013-09-02 MED ORDER — ENSURE COMPLETE PO LIQD
237.0000 mL | Freq: Three times a day (TID) | ORAL | Status: DC
  Administered 2013-09-02 – 2013-09-05 (×8): 237 mL via ORAL

## 2013-09-02 MED ORDER — SODIUM CHLORIDE 0.9 % IJ SOLN
3.0000 mL | Freq: Two times a day (BID) | INTRAMUSCULAR | Status: DC
  Administered 2013-09-04 – 2013-09-05 (×2): 3 mL via INTRAVENOUS

## 2013-09-02 MED ORDER — POLYVINYL ALCOHOL 1.4 % OP SOLN
1.0000 [drp] | Freq: Every day | OPHTHALMIC | Status: DC
  Administered 2013-09-02 – 2013-09-05 (×4): 1 [drp] via OPHTHALMIC
  Filled 2013-09-02 (×2): qty 15

## 2013-09-02 MED ORDER — TETANUS-DIPHTH-ACELL PERTUSSIS 5-2.5-18.5 LF-MCG/0.5 IM SUSP: 0.5000 mL | Freq: Once | INTRAMUSCULAR | Status: DC

## 2013-09-02 MED ORDER — BIOTENE DRY MOUTH MT LIQD
15.0000 mL | Freq: Two times a day (BID) | OROMUCOSAL | Status: DC
  Administered 2013-09-02 – 2013-09-05 (×6): 15 mL via OROMUCOSAL

## 2013-09-02 MED ORDER — PANTOPRAZOLE SODIUM 40 MG PO TBEC
40.0000 mg | DELAYED_RELEASE_TABLET | Freq: Every day | ORAL | Status: DC
  Administered 2013-09-02 – 2013-09-05 (×4): 40 mg via ORAL
  Filled 2013-09-02 (×4): qty 1

## 2013-09-02 MED ORDER — POLYETHYL GLYCOL-PROPYL GLYCOL 0.4-0.3 % OP SOLN: Freq: Every day | OPHTHALMIC | Status: DC

## 2013-09-02 MED ORDER — ACETAMINOPHEN 325 MG PO TABS
975.0000 mg | ORAL_TABLET | Freq: Once | ORAL | Status: AC
  Administered 2013-09-02: 975 mg via ORAL
  Filled 2013-09-02: qty 3

## 2013-09-02 MED ORDER — ENSURE PO LIQD: 1.0000 | Freq: Three times a day (TID) | ORAL | Status: DC

## 2013-09-02 MED ORDER — RIVAROXABAN 20 MG PO TABS
20.0000 mg | ORAL_TABLET | Freq: Every day | ORAL | Status: DC
  Administered 2013-09-02 – 2013-09-05 (×4): 20 mg via ORAL
  Filled 2013-09-02 (×4): qty 1

## 2013-09-02 MED ORDER — DIPHENOXYLATE-ATROPINE 2.5-0.025 MG PO TABS: 2.0000 | ORAL_TABLET | Freq: Four times a day (QID) | ORAL | Status: DC | PRN

## 2013-09-02 MED ORDER — GABAPENTIN 300 MG PO CAPS
300.0000 mg | ORAL_CAPSULE | Freq: Every day | ORAL | Status: DC
  Administered 2013-09-02 – 2013-09-05 (×4): 300 mg via ORAL
  Filled 2013-09-02 (×4): qty 1

## 2013-09-02 MED ORDER — SODIUM CHLORIDE 0.9 % IV SOLN
1500.0000 mg | INTRAVENOUS | Status: DC
  Administered 2013-09-02 – 2013-09-03 (×2): 1500 mg via INTRAVENOUS
  Filled 2013-09-02 (×3): qty 1500

## 2013-09-02 MED ORDER — ASPIRIN 81 MG PO CHEW
81.0000 mg | CHEWABLE_TABLET | Freq: Every day | ORAL | Status: DC
  Administered 2013-09-03 – 2013-09-05 (×3): 81 mg via ORAL
  Filled 2013-09-02 (×3): qty 1

## 2013-09-02 MED ORDER — ACETAMINOPHEN 325 MG PO TABS
650.0000 mg | ORAL_TABLET | Freq: Four times a day (QID) | ORAL | Status: DC | PRN
  Administered 2013-09-02 – 2013-09-03 (×2): 650 mg via ORAL
  Filled 2013-09-02 (×2): qty 2

## 2013-09-02 MED ORDER — SODIUM CHLORIDE 0.9 % IV SOLN: 250.0000 mL | INTRAVENOUS | Status: DC | PRN

## 2013-09-02 NOTE — Progress Notes (Signed)
ANTIBIOTIC CONSULT NOTE - INITIAL  Pharmacy Consult for Vancomycin and Cefepime Indication: pneumonia  No Known Allergies  Patient Measurements: Height: 5\' 9"  (175.3 cm) Weight: 205 lb 7.5 oz (93.2 kg) IBW/kg (Calculated) : 70.7 Adjusted Body Weight:   Vital Signs: Temp: 99.4 F (37.4 C) (02/08 1530) Temp src: Oral (02/08 1530) BP: 102/54 mmHg (02/08 1530) Pulse Rate: 68 (02/08 1530) Intake/Output from previous day:   Intake/Output from this shift: Total I/O In: -  Out: 200 [Urine:200]  Labs:  Recent Labs  08/31/13 1757 09/02/13 1215  WBC 5.6 6.1  HGB 9.6* 9.0*  PLT 229 213  CREATININE 1.50* 1.50*   Estimated Creatinine Clearance: 43.5 ml/min (by C-G formula based on Cr of 1.5). No results found for this basename: VANCOTROUGH, VANCOPEAK, VANCORANDOM, GENTTROUGH, GENTPEAK, GENTRANDOM, TOBRATROUGH, TOBRAPEAK, TOBRARND, AMIKACINPEAK, AMIKACINTROU, AMIKACIN,  in the last 72 hours   Microbiology: No results found for this or any previous visit (from the past 720 hour(s)).  Medical History: Past Medical History  Diagnosis Date  . Myocardial infarction 1990    "Massive"- total LAD with collaterals  . Gout   . Hypertension   . Arthritis, rheumatoid   . Diabetes mellitus without complication   . CHF (congestive heart failure)   . BPH (benign prostatic hyperplasia)   . Neuropathy   . Hyperlipidemia   . Ischemic cardiomyopathy March 2012    EF 31% Myoview. Pt declines ICD  . Colon cancer 2014    Surg, chemo March 2014    Medications:  Prescriptions prior to admission  Medication Sig Dispense Refill  . aspirin 81 MG tablet Take 81 mg by mouth daily.        . benazepril (LOTENSIN) 10 MG tablet Take 1 tablet (10 mg total) by mouth daily.  90 tablet  3  . chlorhexidine (PERIDEX) 0.12 % solution Use as directed 15 mLs in the mouth or throat 2 (two) times daily. Use as directed for mouth irritation      . diclofenac (VOLTAREN) 75 MG EC tablet Take 1 tablet (75 mg  total) by mouth 2 (two) times daily as needed.  180 tablet  1  . diclofenac sodium (VOLTAREN) 1 % GEL Apply 4 g topically 4 (four) times daily.  100 g  2  . diphenoxylate-atropine (LOMOTIL) 2.5-0.025 MG per tablet Take 2 tablets by mouth 4 (four) times daily as needed for diarrhea or loose stools.  60 tablet  1  . doxazosin (CARDURA) 8 MG tablet Take 0.5 tablets (4 mg total) by mouth 2 (two) times daily.  90 tablet  3  . gabapentin (NEURONTIN) 300 MG capsule Take 1 capsule (300 mg total) by mouth as needed.  30 capsule  5  . hydrochlorothiazide (HYDRODIURIL) 25 MG tablet TAKE 1 TABLET BY MOUTH EVERY DAY  90 tablet  3  . isosorbide mononitrate (IMDUR) 60 MG 24 hr tablet TAKE 1 TABLET BY MOUTH EVERY DAY  90 tablet  3  . lidocaine-prilocaine (EMLA) cream Apply topically as needed. Prior to chemotherapy treatment, Apply approx 1/2 teaspoon to skin over port, do not rub in. Cover with plastic,  30 g  1  . metoprolol succinate (TOPROL-XL) 50 MG 24 hr tablet Take 50 mg by mouth daily. Take with or immediately following a meal.      . Multiple Vitamin (MULTIVITAMIN) tablet Take 1 tablet by mouth daily.        . mupirocin cream (BACTROBAN) 2 % Apply topically as needed.      Marland Kitchen  nitroGLYCERIN (NITROSTAT) 0.4 MG SL tablet Place 0.4 mg under the tongue every 5 (five) minutes as needed.        . Nutritional Supplements (ENSURE PO) Take 1 Can by mouth daily.      [name] Glycol-Propyl Glycol (SYSTANE OP) Place 1 drop into both eyes daily.       . predniSONE (STERAPRED UNI-PAK) 10 MG tablet 6 tabs po days 1-2, 5 tabs po days 3-4, 4 tabs po days 5-6, 3 tabs po days 7-8, 2 tabs po days 9-10, 1 tab po days 11-12  42 tablet  0  . prochlorperazine (COMPAZINE) 10 MG tablet Take 1 tablet (10 mg total) by mouth every 6 (six) hours as needed.  30 tablet  1  . Rivaroxaban (XARELTO) 20 MG TABS tablet TAKE 1 TABLET BY MOUTH DAILY  90 tablet  3  . simvastatin (ZOCOR) 40 MG tablet Take 0.5 tablets (20 mg total) by mouth  every morning.  90 tablet  3  . sodium chloride (OCEAN) 0.65 % SOLN nasal spray Place 1 spray into the nose as needed for congestion.  1 Bottle  0  . torsemide (DEMADEX) 20 MG tablet Take 1 tablet (20 mg total) by mouth every evening.  90 tablet  3  . VITAMIN E PO Take 1 capsule by mouth at bedtime.      [name] 20 MG TABS tablet TAKE 1 TABLET BY MOUTH DAILY  30 tablet  0  . XARELTO 20 MG TABS tablet TAKE 1 TABLET BY MOUTH DAILY  30 tablet  0   Assessment: 83yom admitted with cough and SOB to start Vancomycin and Cefepime for suspected PNA. Patient received Ceftriaxone and Azithromycin in ED. - Tmax 101.7, WBC wnl - Weight: 93kg - Crcl 44  Goal of Therapy:  Vancomycin trough level 15-20 mcg/ml  Plan:  1. Vancomycin 1.5g IV q24h 2. Change Cefepime to 1g IV q12h 3. Monitor renal function, cultures, clinical course and check VT @ SS  Earleen Newport [phone] 09/02/2013,4:46 PM

## 2013-09-02 NOTE — H&P (Signed)
[facility] Admission History and Physical Service Pager: [phone]  Patient name: Todd [name] Medical record number: [id number] Date of birth: 1930-06-23 Age: 80 y.o. Gender: male  Primary Care Provider: Willeen Niece, MD Consultants: none Code Status: DNR (confirmed on admission)  Chief Complaint: Shortness of Breath, Cough, Fever  Assessment and Plan: Todd [name] is a 80 y.o. male presenting with worsening shortness of breath, cough, and fever (101.49F), found to have bibasilar infiltrates consistent with HCAP (in setting of recent chemotherapy). PMH is significant for Metastatic Colon Cancer (Stage IV) s/p sigmoid resection with palliative chemotherapy, H/o hypercoagulability provoked PE (03/2013), CAD s/p MI, combined CHF, HTN, HLD, OA, RA   # Presumed Bibasilar HCAP, in setting of recent IV Chemotherapy Presentation clinically consistent with PNA (cough, fever 101.7, focal crackles/rhonchi findings bibasilar), CXR with worsening bibasilar L>R patchy infiltrates (compared to prior 08/31/13). Considered HCAP given recent IV chemotherapy. - Admit to FPTS, telemetry, droplet precautions until influenza negative - closely monitor VS, O2 saturation - Start antibiotics for HCAP coverage (s/p CTX and Azithro x 1 dose):      - Vancomycin / Cefepime, per pharm (2/8>>) - f/u blood cultures x 2 - influenza panel (negative) - Check urine antigens - legionella, strep pneumo - ordered HIV antibody  # Combined CHF, secondary to h/o ischemic cardiomyopathy Last ECHO (10/17/12) with reduced EF 35-40% d/t severe hypokinesis, Grade 2 Diastolic dysfxn, mild-mod AR, mod-severe MR Current volume status is overall difficult to determine, clinically patient appears to be dry to euvolemic, however wt is up about 10 lbs to 205 (vs dry wt 194), LE edema is mild and appears to be at baseline, focal lung findings with bibasilar crackles likely attributed to PNA. ProBNP would suggest  volume overload with 9281 (vs 3523, 2 days ago). No home oxygen use. - closely monitor fluid status - daily wt / strict I/Os - repeat 2D ECHO - Hold diuretics, home torsemide 20mg  daily, until his renal function improves, then plan diuresis  # H/o CAD S/p MI with stent placement in 1990 (documented as LAD)  - Continue ASA 81mg  daily - EKG, NSR with PVCs, no acute ST-T wave changes  # Colon Cancer (Stage IV), with mets to liver S/p sigmoid resection, currently followed for palliative chemotherapy by Dr. Alen Blew. Recently has been receiving chemotherapy q 2 weeks, with chemo break during January 2015.  # Chronic hypercoagulability, secondary to malignancy H/o provoked PE (03/2013), currently on anticoagulation with Xarelto - continue home Xarelto 20mg  daily, CrCl >30 (calculated at 44)  # AKI, in setting of CKD (Stage III) Baseline Cr about 1.00 - elevated Cr 1.50 - f/u daily BMET, Cr trend - SLIV - Hold nephrotoxic agents, Benazepril 10mg  daily, NSAIDs  # HTN - BP 102/54 initially - Hold Doxazosin 4mg  BID, HCTZ 25mg  daily, Imdur 60mg  24 hr,   # HLD - Continue simvastatin 20mg  daily  # H/o arthritis - Continue Gabapentin 300mg  daily - Hold diclofenac given AKI  FEN/GI: SLIV, Heart Diet, Protonix 40mg  PO daily Prophylaxis: No VTE prophylaxis - therapeutic anticoagulation with Xarelto  Disposition: Admitted to [facility], inpatient status for HCAP, pending complete diagnostic work-up and improving clinical course, expect that patient will remain hospitalized for >2 days  History of Present Illness: Todd [name] is a 80 y.o. male presenting with worsening shortness of breath, cough, and fever (101.49F).  Presents with recent onset symptoms within past several days, worsening about 2 days ago with shortness of breath and cough. Given history of  CHF, patient noticed increase in recent wt gain from "dry wt 194 lbs" up to 200 lbs, which triggered him to increase his daily Torsemide dose from  20mg  to 30mg  (per his Cardiologist). Development of cough has worsened his breathing, making him more short of breath, describes cough as heavy dry cough without mucus production for the past 12 hours. This morning he did not take any of his regular medications (including Torsemide and Xarelto). He has stable 2 pillow orthopnea and denies PND.   Recent history prior ED visit on Friday, for persistent nosebleed, currently on Xarelto (for h/o PE), advised to go to ED by Oncology Dr. Alen Blew. At that time his bleeding had eventually stopped. Other work-up included ProBNP 3000, CXR read as bibasilar atelectasis. He was discharged home, but his breathing did not seem to improve within the past 48 hours.  Baseline Hx: At baseline able to walk about 1 hour without dyspnea, sleeps with 2 pillows nightly, normally sleeps elevated. Last chemotherapy course 2 weeks ago, experiences significant fatigue and side-effects following. Recent chemotherapy holiday in January. Currently treated with palliative chemotherapy q 2 weeks.  Review Of Systems: Per HPI with the following additions:  Admits fever/chills, persistent coughing, occasional diarrhea / constipation secondary to chemotherapy, recent poor appetite over past 2 days (previously improved). Denies palpitations, CP, abdominal pain, nausea / vomiting, increased edema, body aches, PND, vision changes, numbness / tingling, weakness, no sick contacts.  Otherwise 12 point review of systems was performed and was unremarkable.  Patient Active Problem List   Diagnosis Date Noted  . CAP (community acquired pneumonia) 09/02/2013  . Dizziness and giddiness 08/03/2013  . Heel pain 07/27/2013  . Essential hypertension 05/21/2013  . Skin irritation 04/17/2013  . Protein-calorie malnutrition, severe 04/02/2013  . Acute pulmonary embolism- 04/02/13 04/02/2013  . Acute on chronic combined systolic and diastolic congestive heart failure 04/02/2013  . Diastolic  dysfunction- grade 24 September 2012 04/02/2013  . Dyspnea 03/09/2013  . Myocardial infarction'1995   . Arthritis, rheumatoid   . BPH (benign prostatic hyperplasia)   . Neuropathy   . Epistaxis 01/02/2013  . Onychomycosis 01/02/2013  . Right knee pain 12/25/2012  . Colon cancer metastasized to liver 09/19/2012  . CAD, Ant MI 1990- total LAD with collaterals, Myoview low risk 3/12 09/06/2012  . Heart murmur 09/06/2012  . Nausea with vomiting and diarrhea 09/05/2012  . Elevated lactic acid level 09/05/2012  . Elevated lipase 09/05/2012  . Leukocytosis, unspecified 09/05/2012  . Prostate enlargement 09/05/2012  . Seborrheic keratosis 07/07/2012  . Hip pain, right 08/10/2011  . Right elbow pain 05/26/2011  . Tinea pedis 10/23/2010  . HYPERTENSION 07/22/2009  . PROSTATE SPECIFIC ANTIGEN, ELEVATED 07/22/2009  . DIABETES MELLITUS, CONTROLLED, WITHOUT COMPLICATIONS [id number]  . BACK PAIN, LUMBAR 03/25/2009  . COUGH 09/09/2008  . Olecranon bursitis 11/16/2007  . ROTATOR CUFF INJURY, RIGHT SHOULDER 05/04/2007  . HYPERLIPIDEMIA 09/22/2006  . Gout, unspecified 09/22/2006  . OBESITY, NOS 09/22/2006  . HYPERTENSION, BENIGN SYSTEMIC 09/22/2006  . Ischemic cardiomyopathy, EF 35-40% by echo March 2014 09/22/2006  . Hyperplasia of prostate 09/22/2006  . OSTEOARTHRITIS OF SPINE, NOS 09/22/2006   Past Medical History: Past Medical History  Diagnosis Date  . Myocardial infarction 1990    "Massive"- total LAD with collaterals  . Gout   . Hypertension   . Arthritis, rheumatoid   . Diabetes mellitus without complication   . CHF (congestive heart failure)   . BPH (benign prostatic hyperplasia)   . Neuropathy   .  Hyperlipidemia   . Ischemic cardiomyopathy March 2012    EF 31% Myoview. Pt declines ICD  . Colon cancer 2014    Surg, chemo March 2014   Past Surgical History: Past Surgical History  Procedure Laterality Date  . Cataract extraction    . Flexible sigmoidoscopy N/A 09/05/2012     Procedure: FLEXIBLE SIGMOIDOSCOPY;  Surgeon: Missy Sabins, MD;  Location: [facility];  Service: Endoscopy;  Laterality: N/A;  Give 1000 cc tapwater enema on call to endoscopy.  . Colon resection N/A 09/07/2012    Procedure: LAPAROSCOPIC ASSISTED SIGMOID COLON RESECTION END OSTOMY;  Surgeon: Shann Medal, MD;  Location: [facility];  Service: General;  Laterality: N/A;  . Portacath placement N/A 09/07/2012    Procedure: Placement of power port;  Surgeon: Shann Medal, MD;  Location: [facility];  Service: General;  Laterality: N/A;   Social History: History  Substance Use Topics  . Smoking status: Former Smoker -- 1.00 packs/day    Quit date: 04/01/2009  . Smokeless tobacco: Never Used     Comment: Quit 45 years ago.  . Alcohol Use: No   Additional social history: Lives at home with his wife.  Please also refer to relevant sections of EMR.  Family History: Family History  Problem Relation Age of Onset  . Stroke Father   . Diabetes Father   . Hypertension Father   . Heart attack Father   . Hyperlipidemia Neg Hx   . Sudden death Neg Hx    Allergies and Medications: No Known Allergies No current facility-administered medications on file prior to encounter.   Current Outpatient Prescriptions on File Prior to Encounter  Medication Sig Dispense Refill  . aspirin 81 MG tablet Take 81 mg by mouth daily.        . benazepril (LOTENSIN) 10 MG tablet Take 1 tablet (10 mg total) by mouth daily.  90 tablet  3  . chlorhexidine (PERIDEX) 0.12 % solution Use as directed 15 mLs in the mouth or throat 2 (two) times daily. Use as directed for mouth irritation      . diclofenac (VOLTAREN) 75 MG EC tablet Take 1 tablet (75 mg total) by mouth 2 (two) times daily as needed.  180 tablet  1  . diclofenac sodium (VOLTAREN) 1 % GEL Apply 4 g topically 4 (four) times daily.  100 g  2  . diphenoxylate-atropine (LOMOTIL) 2.5-0.025 MG per tablet Take 2 tablets by mouth 4 (four) times daily as needed for diarrhea or  loose stools.  60 tablet  1  . doxazosin (CARDURA) 8 MG tablet Take 0.5 tablets (4 mg total) by mouth 2 (two) times daily.  90 tablet  3  . gabapentin (NEURONTIN) 300 MG capsule Take 1 capsule (300 mg total) by mouth as needed.  30 capsule  5  . hydrochlorothiazide (HYDRODIURIL) 25 MG tablet TAKE 1 TABLET BY MOUTH EVERY DAY  90 tablet  3  . isosorbide mononitrate (IMDUR) 60 MG 24 hr tablet TAKE 1 TABLET BY MOUTH EVERY DAY  90 tablet  3  . lidocaine-prilocaine (EMLA) cream Apply topically as needed. Prior to chemotherapy treatment, Apply approx 1/2 teaspoon to skin over port, do not rub in. Cover with plastic,  30 g  1  . metoprolol succinate (TOPROL-XL) 50 MG 24 hr tablet Take 50 mg by mouth daily. Take with or immediately following a meal.      . Multiple Vitamin (MULTIVITAMIN) tablet Take 1 tablet by mouth daily.        Marland Kitchen  mupirocin cream (BACTROBAN) 2 % Apply topically as needed.      . nitroGLYCERIN (NITROSTAT) 0.4 MG SL tablet Place 0.4 mg under the tongue every 5 (five) minutes as needed.        . Nutritional Supplements (ENSURE PO) Take 1 Can by mouth daily.      [name] Glycol-Propyl Glycol (SYSTANE OP) Place 1 drop into both eyes daily.       . predniSONE (STERAPRED UNI-PAK) 10 MG tablet 6 tabs po days 1-2, 5 tabs po days 3-4, 4 tabs po days 5-6, 3 tabs po days 7-8, 2 tabs po days 9-10, 1 tab po days 11-12  42 tablet  0  . prochlorperazine (COMPAZINE) 10 MG tablet Take 1 tablet (10 mg total) by mouth every 6 (six) hours as needed.  30 tablet  1  . Rivaroxaban (XARELTO) 20 MG TABS tablet TAKE 1 TABLET BY MOUTH DAILY  90 tablet  3  . simvastatin (ZOCOR) 40 MG tablet Take 0.5 tablets (20 mg total) by mouth every morning.  90 tablet  3  . sodium chloride (OCEAN) 0.65 % SOLN nasal spray Place 1 spray into the nose as needed for congestion.  1 Bottle  0  . torsemide (DEMADEX) 20 MG tablet Take 1 tablet (20 mg total) by mouth every evening.  90 tablet  3  . VITAMIN E PO Take 1 capsule by mouth  at bedtime.      [name] 20 MG TABS tablet TAKE 1 TABLET BY MOUTH DAILY  30 tablet  0  . XARELTO 20 MG TABS tablet TAKE 1 TABLET BY MOUTH DAILY  30 tablet  0    Objective: BP 102/54  Pulse 68  Temp(Src) 99.4 F (37.4 C) (Oral)  Resp 19  Ht 5\' 9"  (1.753 m)  Wt 205 lb 7.5 oz (93.2 kg)  BMI 30.33 kg/m2  SpO2 93% Exam: General: chronically ill appearing, 36 yr elderly M, sitting up in bed, pleasant and conversational, NAD HEENT: NCAT, PERRL, EOMI, patent nares b/l without congestion / epistaxis, oropharynx clear, mild dry MM Cardiovascular: RRR, 2/6 systolic murmur loudest over L-sternal border / apex Respiratory: Scattered rhonchi with primarily bibasilar crackles and rhonchi R>L. No wheezing heard. Normal work of breathing, no tachypnea or respiratory distress Abdomen: soft, NTND, +active BS, ostomy bag in place with scant solid brown stool collection, ostomy tissue appears pink and appropriate Extremities: +1 pitting bilateral LE edema, non-tender, moves all ext, compression stockings in place Skin: warm, dry, no rashes, large seborrheic dermatosis L-forehead Neuro: awake, alert, oriented, CN-II-XII grossly intact, intact muscle strength in all ext 5/5, distal sensation to light touch intact  Labs and Imaging: CBC BMET   Recent Labs Lab 09/02/13 1215  WBC 6.1  HGB 9.0*  HCT 27.6*  PLT 213    Recent Labs Lab 09/02/13 1215  NA 138  K 3.6*  CL 97  CO2 25  BUN 31*  CREATININE 1.50*  GLUCOSE 161*  CALCIUM 8.8     Lactic Acid - 1.05  ProBNP 9281 (last 3523 on 2/6)  Coags: INR 2.52 (on 2/6) PT 26.3 (on 2/6)  2/8 EKG NSR with PVCs, no acute ST-T wave changes  2/8 CXR 2v IMPRESSION:  Patchy bibasilar infiltrate, slightly more on the left than on the  right.  Nobie Putnam, DO 09/02/2013, 3:52 PM PGY-1, [facility] Intern pager: [phone], text pages welcome  I have seen and examined the patient and agree with Dr. Parks Ranger'  documentation  above. My annotations are in blue.   Laroy Apple, MD [facility] Resident, PGY-2 09/02/2013, 9:38 PM

## 2013-09-02 NOTE — ED Notes (Signed)
[facility] called to transport to [facility], RN aware

## 2013-09-02 NOTE — ED Notes (Signed)
MD at bedside. 

## 2013-09-02 NOTE — ED Provider Notes (Signed)
CSN: [id number]     Arrival date & time 09/02/13  1101 History   First MD Initiated Contact with Patient 09/02/13 1138     Chief Complaint  Patient presents with  . Cough  . Shortness of Breath    (Consider location/radiation/quality/duration/timing/severity/associated sxs/prior Treatment) HPI Complains of dry cough fever and shortness of breath onset 2 days ago. No treatment prior to coming here. No chest pain. No nausea or vomiting. Nothing makes symptoms better or worse. Associated symptoms include slight sore throat. No headache. No myalgias. No other associated symptoms. Past Medical History  Diagnosis Date  . Myocardial infarction 1990    "Massive"- total LAD with collaterals  . Gout   . Hypertension   . Arthritis, rheumatoid   . Diabetes mellitus without complication   . CHF (congestive heart failure)   . BPH (benign prostatic hyperplasia)   . Neuropathy   . Hyperlipidemia   . Ischemic cardiomyopathy March 2012    EF 31% Myoview. Pt declines ICD  . Colon cancer 2014    Surg, chemo March 2014   Past Surgical History  Procedure Laterality Date  . Cataract extraction    . Flexible sigmoidoscopy N/A 09/05/2012    Procedure: FLEXIBLE SIGMOIDOSCOPY;  Surgeon: Missy Sabins, MD;  Location: [facility];  Service: Endoscopy;  Laterality: N/A;  Give 1000 cc tapwater enema on call to endoscopy.  . Colon resection N/A 09/07/2012    Procedure: LAPAROSCOPIC ASSISTED SIGMOID COLON RESECTION END OSTOMY;  Surgeon: Shann Medal, MD;  Location: [facility];  Service: General;  Laterality: N/A;  . Portacath placement N/A 09/07/2012    Procedure: Placement of power port;  Surgeon: Shann Medal, MD;  Location: [facility];  Service: General;  Laterality: N/A;   Family History  Problem Relation Age of Onset  . Stroke Father   . Diabetes Father   . Hypertension Father   . Heart attack Father   . Hyperlipidemia Neg Hx   . Sudden death Neg Hx    History  Substance Use Topics  . Smoking status:  Former Smoker -- 1.00 packs/day    Quit date: 04/01/2009  . Smokeless tobacco: Never Used     Comment: Quit 45 years ago.  . Alcohol Use: No    Review of Systems  Constitutional: Positive for fever.  HENT: Positive for sore throat.   Respiratory: Positive for cough and shortness of breath.   Cardiovascular: Positive for leg swelling.       Chronic leg swelling  Gastrointestinal: Negative.   Musculoskeletal: Negative.   Skin: Negative.   Neurological: Negative.   Psychiatric/Behavioral: Negative.   All other systems reviewed and are negative.    Allergies  Review of patient's allergies indicates no known allergies.  Home Medications   Current Outpatient Rx  Name  Route  Sig  Dispense  Refill  . aspirin 81 MG tablet   Oral   Take 81 mg by mouth daily.           . benazepril (LOTENSIN) 10 MG tablet   Oral   Take 1 tablet (10 mg total) by mouth daily.   90 tablet   3   . chlorhexidine (PERIDEX) 0.12 % solution   Mouth/Throat   Use as directed 15 mLs in the mouth or throat 2 (two) times daily. Use as directed for mouth irritation         . diclofenac (VOLTAREN) 75 MG EC tablet   Oral   Take 1 tablet (75  mg total) by mouth 2 (two) times daily as needed.   180 tablet   1     **Patient requests 90 days supply**   . diclofenac sodium (VOLTAREN) 1 % GEL   Topical   Apply 4 g topically 4 (four) times daily.   100 g   2   . diphenoxylate-atropine (LOMOTIL) 2.5-0.025 MG per tablet   Oral   Take 2 tablets by mouth 4 (four) times daily as needed for diarrhea or loose stools.   60 tablet   1   . doxazosin (CARDURA) 8 MG tablet   Oral   Take 0.5 tablets (4 mg total) by mouth 2 (two) times daily.   90 tablet   3   . gabapentin (NEURONTIN) 300 MG capsule   Oral   Take 1 capsule (300 mg total) by mouth as needed.   30 capsule   5   . hydrochlorothiazide (HYDRODIURIL) 25 MG tablet      TAKE 1 TABLET BY MOUTH EVERY DAY   90 tablet   3   . isosorbide  mononitrate (IMDUR) 60 MG 24 hr tablet      TAKE 1 TABLET BY MOUTH EVERY DAY   90 tablet   3   . lidocaine-prilocaine (EMLA) cream   Topical   Apply topically as needed. Prior to chemotherapy treatment, Apply approx 1/2 teaspoon to skin over port, do not rub in. Cover with plastic,   30 g   1   . metoprolol succinate (TOPROL-XL) 50 MG 24 hr tablet   Oral   Take 50 mg by mouth daily. Take with or immediately following a meal.         . Multiple Vitamin (MULTIVITAMIN) tablet   Oral   Take 1 tablet by mouth daily.           . mupirocin cream (BACTROBAN) 2 %   Topical   Apply topically as needed.         . nitroGLYCERIN (NITROSTAT) 0.4 MG SL tablet   Sublingual   Place 0.4 mg under the tongue every 5 (five) minutes as needed.           . Nutritional Supplements (ENSURE PO)   Oral   Take 1 Can by mouth daily.         [name] Glycol-Propyl Glycol (SYSTANE OP)   Both Eyes   Place 1 drop into both eyes daily.          . predniSONE (STERAPRED UNI-PAK) 10 MG tablet      6 tabs po days 1-2, 5 tabs po days 3-4, 4 tabs po days 5-6, 3 tabs po days 7-8, 2 tabs po days 9-10, 1 tab po days 11-12   42 tablet   0   . prochlorperazine (COMPAZINE) 10 MG tablet   Oral   Take 1 tablet (10 mg total) by mouth every 6 (six) hours as needed.   30 tablet   1     For nausea and vomiting   . Rivaroxaban (XARELTO) 20 MG TABS tablet      TAKE 1 TABLET BY MOUTH DAILY   90 tablet   3   . simvastatin (ZOCOR) 40 MG tablet   Oral   Take 0.5 tablets (20 mg total) by mouth every morning.   90 tablet   3   . sodium chloride (OCEAN) 0.65 % SOLN nasal spray   Nasal   Place 1 spray into the nose as needed for congestion.  1 Bottle   0   . torsemide (DEMADEX) 20 MG tablet   Oral   Take 1 tablet (20 mg total) by mouth every evening.   90 tablet   3   . VITAMIN E PO   Oral   Take 1 capsule by mouth at bedtime.         [name] 20 MG TABS tablet      TAKE 1 TABLET  BY MOUTH DAILY   30 tablet   0   . XARELTO 20 MG TABS tablet      TAKE 1 TABLET BY MOUTH DAILY   30 tablet   0    BP 109/58  Pulse 109  Temp(Src) 101.7 F (38.7 C) (Oral)  Resp 20  Ht 5' 9.5" (1.765 m)  Wt 200 lb (90.719 kg)  BMI 29.12 kg/m2  SpO2 92% Physical Exam  Nursing note and vitals reviewed. Constitutional: He appears well-developed and well-nourished. No distress.  HENT:  Head: Normocephalic and atraumatic.  Eyes: Conjunctivae are normal. Pupils are equal, round, and reactive to light.  Neck: Neck supple. No tracheal deviation present. No thyromegaly present.  Cardiovascular: Normal rate and regular rhythm.   No murmur heard. Pulmonary/Chest: Effort normal.  Scant diffuse rhonchi  Abdominal: Soft. Bowel sounds are normal. He exhibits no distension. There is no tenderness.  Musculoskeletal: Normal range of motion. He exhibits edema. He exhibits no tenderness.  Trace pretibial pitting edema bilaterally  Neurological: He is alert. Coordination normal.  Skin: Skin is warm and dry. No rash noted.  Psychiatric: He has a normal mood and affect.    ED Course  Procedures (including critical care time) Labs Review Labs Reviewed  PRO B NATRIURETIC PEPTIDE  CBC  BASIC METABOLIC PANEL  INFLUENZA PANEL BY PCR (TYPE A & B, H1N1)   Imaging Review Dg Chest 2 View  08/31/2013   CLINICAL DATA:  Shortness of breath, epistaxis.  EXAM: CHEST  2 VIEW  COMPARISON:  CT ANGIO CHEST W/CM &/OR WO/CM dated 04/02/2013; DG CHEST 2 VIEW dated 04/01/2013  FINDINGS: Right Port-A-Cath remains in place, unchanged. Heart is borderline in size. Bibasilar atelectasis, best seen on the lateral view. No effusions. No overt edema. No acute bony abnormality.  IMPRESSION: Bibasilar atelectasis.   Electronically Signed   By: Rolm Baptise M.D.   On: 08/31/2013 17:57    EKG Interpretation   None      Date: 09/02/2013  Rate: 95  Rhythm: normal sinus rhythm  QRS Axis: normal  Intervals: normal  ST/T  Wave abnormalities: normal  Conduction Disutrbances:none  Narrative Interpretation: Poor R-wave progression, PVCs present.  Old EKG Reviewed: No significant change from 04/01/2013 interpreted by me Results for orders placed during the hospital encounter of 09/02/13  PRO B NATRIURETIC PEPTIDE      Result Value Range   Pro B Natriuretic peptide (BNP) 9281.0 (*) 0 - 450 pg/mL  CBC      Result Value Range   WBC 6.1  4.0 - 10.5 K/uL   RBC 2.99 (*) 4.22 - 5.81 MIL/uL   Hemoglobin 9.0 (*) 13.0 - 17.0 g/dL   HCT 27.6 (*) 39.0 - 52.0 %   MCV 92.3  78.0 - 100.0 fL   MCH 30.1  26.0 - 34.0 pg   MCHC 32.6  30.0 - 36.0 g/dL   RDW 15.1  11.5 - 15.5 %   Platelets 213  150 - 400 K/uL  BASIC METABOLIC PANEL      Result Value  Range   Sodium 138  137 - 147 mEq/L   Potassium 3.6 (*) 3.7 - 5.3 mEq/L   Chloride 97  96 - 112 mEq/L   CO2 25  19 - 32 mEq/L   Glucose, Bld 161 (*) 70 - 99 mg/dL   BUN 31 (*) 6 - 23 mg/dL   Creatinine, Ser 1.50 (*) 0.50 - 1.35 mg/dL   Calcium 8.8  8.4 - 10.5 mg/dL   GFR calc non Af Amer 42 (*) >90 mL/min   GFR calc Af Amer 49 (*) >90 mL/min  CG4 I-STAT (LACTIC ACID)      Result Value Range   Lactic Acid, Venous 1.05  0.5 - 2.2 mmol/L   Dg Chest 2 View  09/02/2013   CLINICAL DATA:  Cough and shortness of breath  EXAM: CHEST  2 VIEW  COMPARISON:  August 31, 2013  FINDINGS: There is focal infiltrate in the left and right bases, somewhat more consolidated on the left than on the right. Elsewhere lungs are clear. Heart is borderline enlarged with normal pulmonary vascularity. Central catheter tip is in the superior vena cava near the cavoatrial junction. No pneumothorax. No adenopathy. No bone lesions.  IMPRESSION: Patchy bibasilar infiltrate, slightly more on the left than on the right.   Electronically Signed   By: Lowella Grip M.D.   On: 09/02/2013 12:05   Dg Chest 2 View  08/31/2013   CLINICAL DATA:  Shortness of breath, epistaxis.  EXAM: CHEST  2 VIEW  COMPARISON:  CT  ANGIO CHEST W/CM &/OR WO/CM dated 04/02/2013; DG CHEST 2 VIEW dated 04/01/2013  FINDINGS: Right Port-A-Cath remains in place, unchanged. Heart is borderline in size. Bibasilar atelectasis, best seen on the lateral view. No effusions. No overt edema. No acute bony abnormality.  IMPRESSION: Bibasilar atelectasis.   Electronically Signed   By: Rolm Baptise M.D.   On: 08/31/2013 17:57    Chest x-ray viewed by me MDM  No diagnosis found.  In light of cough and fever we'll treat for community acquired pneumonia. Elevated BNP , patient's reported weight gain and leg edema suggestive of congestive heart failure. Patient reports that he's recently increased his diuretic. In light of renal insufficiency will not further diuresis patient. Suggest fluid restriction. Spoke with Dr.Karamalegos. In transfer [facility]. Telemetry. Oxygen, antibiotics Diagnosis #1 community acquired pneumonia #2 congestive heart failure #3 renal insufficiency #4 anemia #5 hyperglycemia   Orlie Dakin, MD 09/02/13 1340

## 2013-09-02 NOTE — Progress Notes (Signed)
Patient is alert and oriented. Transported to [facility] from [facility] via ambulance. Pt oriented to room, call bell is within reach. Pt placed on tele box 19 and verified with CMT. Admitting provider call and notified of patient arrival. Will continue to monitor.Marland Kitchen

## 2013-09-02 NOTE — ED Notes (Signed)
Patient here with increasing cough and exertional shortness of breath since Friday night. Patient reports that his chest wall hurts with the cough. Patient does have hx of CHF and today weight up 4 lbs, was a gain of 6 lbs yesterday but increased diuretics and down 2lbs. No distress, wears no oxygen at home

## 2013-09-03 ENCOUNTER — Telehealth: Payer: Self-pay | Admitting: Medical Oncology

## 2013-09-03 DIAGNOSIS — I2510 Atherosclerotic heart disease of native coronary artery without angina pectoris: Secondary | ICD-10-CM

## 2013-09-03 DIAGNOSIS — I10 Essential (primary) hypertension: Secondary | ICD-10-CM

## 2013-09-03 DIAGNOSIS — I219 Acute myocardial infarction, unspecified: Secondary | ICD-10-CM

## 2013-09-03 DIAGNOSIS — R040 Epistaxis: Secondary | ICD-10-CM

## 2013-09-03 DIAGNOSIS — I2589 Other forms of chronic ischemic heart disease: Secondary | ICD-10-CM

## 2013-09-03 DIAGNOSIS — R0989 Other specified symptoms and signs involving the circulatory and respiratory systems: Secondary | ICD-10-CM

## 2013-09-03 DIAGNOSIS — I519 Heart disease, unspecified: Secondary | ICD-10-CM

## 2013-09-03 DIAGNOSIS — R0609 Other forms of dyspnea: Secondary | ICD-10-CM

## 2013-09-03 LAB — CBC
HEMATOCRIT: 27.7 % — AB (ref 39.0–52.0)
HEMOGLOBIN: 9.4 g/dL — AB (ref 13.0–17.0)
MCH: 30.4 pg (ref 26.0–34.0)
MCHC: 33.9 g/dL (ref 30.0–36.0)
MCV: 89.6 fL (ref 78.0–100.0)
Platelets: 193 10*3/uL (ref 150–400)
RBC: 3.09 MIL/uL — AB (ref 4.22–5.81)
RDW: 15.4 % (ref 11.5–15.5)
WBC: 5.9 10*3/uL (ref 4.0–10.5)

## 2013-09-03 LAB — LEGIONELLA ANTIGEN, URINE: Legionella Antigen, Urine: NEGATIVE

## 2013-09-03 LAB — BASIC METABOLIC PANEL
BUN: 32 mg/dL — ABNORMAL HIGH (ref 6–23)
CHLORIDE: 97 meq/L (ref 96–112)
CO2: 24 meq/L (ref 19–32)
Calcium: 8.4 mg/dL (ref 8.4–10.5)
Creatinine, Ser: 1.55 mg/dL — ABNORMAL HIGH (ref 0.50–1.35)
GFR calc non Af Amer: 40 mL/min — ABNORMAL LOW (ref >90)
GFR, EST AFRICAN AMERICAN: 47 mL/min — AB (ref >90)
Glucose, Bld: 118 mg/dL — ABNORMAL HIGH (ref 70–99)
POTASSIUM: 3.9 meq/L (ref 3.7–5.3)
Sodium: 135 mEq/L — ABNORMAL LOW (ref 137–147)

## 2013-09-03 LAB — HIV ANTIBODY (ROUTINE TESTING W REFLEX): HIV: NONREACTIVE

## 2013-09-03 MED ORDER — TORSEMIDE 20 MG PO TABS
20.0000 mg | ORAL_TABLET | Freq: Every day | ORAL | Status: DC
  Filled 2013-09-03: qty 1

## 2013-09-03 NOTE — Progress Notes (Signed)
Utilization review completed.  

## 2013-09-03 NOTE — Telephone Encounter (Signed)
I saw [name] in the hospital today.  We discussed his request for an outpatient Pharmacy Clinic appointment to go over his meds.  I do not see that this request has been addressed yet.  I am routing to admin team and red team.  Thanks,  Hiram Comber

## 2013-09-03 NOTE — H&P (Signed)
Seen and examined.  Discussed with Dr. Wendi Snipes.  Agree with his documentation and management.  Briefly, 80 yo male with stage 4 colon cancer undergoing chemo presents with cough, dyspnea and pneumonia.  Feels better this morning.  Issues HCAP - Would catagorize and treat as HCAP rather than CAP due to chemo.  Seems to be responding. Acute on chronic combined systolic and diastolic heart failure.  10 lb weight gain and bump in BMP.  Would go very slowly on diuresis since he has also bumped his creat. Acute renal insufficiency.  Likely due to HCAP and worsening CHF.  Anticipate return to baseline of 1.0 as he clinically improves. Weak.  Has a couple of recent falls.  Needs PT eval.

## 2013-09-03 NOTE — Evaluation (Signed)
Physical Therapy Evaluation Patient Details Name: Todd [name] MRN: [id number] DOB: 11/06/1929 Today's Date: 09/03/2013 Time: 9629-5284 PT Time Calculation (min): 19 min  PT Assessment / Plan / Recommendation History of Present Illness  Todd [name] is a 80 y.o. male presenting with worsening shortness of breath, cough, and fever (101.68F), found to have bibasilar infiltrates consistent with HCAP (in setting of recent chemotherapy). PMH is significant for Metastatic Colon Cancer (Stage IV) s/p sigmoid resection with palliative chemotherapy, H/o hypercoagulability provoked PE (03/2013), CAD s/p MI, combined CHF, HTN, HLD, OA, RA    Clinical Impression  Pt presents with decreased balance and mobility, impaired gait with RW. Pt will benefit from skilled acute PT services to address balance and safety deficits and decrease risk of falls at home    PT Assessment  Patient needs continued PT services    Follow Up Recommendations  Home health PT;Supervision/Assistance - 24 hour    Does the patient have the potential to tolerate intense rehabilitation      Barriers to Discharge        Equipment Recommendations  None recommended by PT    Recommendations for Other Services     Frequency Min 3X/week    Precautions / Restrictions Precautions Precautions: Fall   Pertinent Vitals/Pain spO2 94% on 2LO2 during gait. No c/o pain      Mobility  Bed Mobility Overal bed mobility: Modified Independent General bed mobility comments: uses rail Transfers Overall transfer level: Needs assistance Equipment used: Rolling walker (2 wheeled) Transfers: Sit to/from Stand Sit to Stand: Supervision General transfer comment: cues for safety and hand placement Ambulation/Gait Ambulation/Gait assistance: Min guard Ambulation Distance (Feet): 200 Feet Assistive device: Rolling walker (2 wheeled) General Gait Details: pt with staggering gait, unsafe with RW. pt needs cues for safety and min guard for  balance. pt with impulsive/quick movements. pt will benefit from dynamic gait and balance training    Exercises     PT Diagnosis: Difficulty walking;Generalized weakness  PT Problem List: Decreased strength;Decreased activity tolerance;Decreased balance;Decreased mobility;Cardiopulmonary status limiting activity PT Treatment Interventions: Balance training;DME instruction;Gait training;Neuromuscular re-education;Stair training;Functional mobility training;Patient/family education;Therapeutic activities;Therapeutic exercise;Modalities     PT Goals(Current goals can be found in the care plan section) Acute Rehab PT Goals Patient Stated Goal: none stated PT Goal Formulation: With patient Time For Goal Achievement: 09/17/13 Potential to Achieve Goals: Good  Visit Information  Last PT Received On: 09/03/13 Assistance Needed: +1 History of Present Illness: Todd [name] is a 80 y.o. male presenting with worsening shortness of breath, cough, and fever (101.68F), found to have bibasilar infiltrates consistent with HCAP (in setting of recent chemotherapy). PMH is significant for Metastatic Colon Cancer (Stage IV) s/p sigmoid resection with palliative chemotherapy, H/o hypercoagulability provoked PE (03/2013), CAD s/p MI, combined CHF, HTN, HLD, OA, RA         Prior Functioning  Home Living Family/patient expects to be discharged to:: Private residence Living Arrangements: Spouse/significant other Available Help at Discharge: Family Type of Home: House Home Access: Stairs to enter Technical brewer of Steps: 4 Entrance Stairs-Rails: Right;Left;Can reach both Home Layout: One level Sedan: Environmental consultant - 2 wheels;Cane - single point Prior Function Level of Independence: Independent with assistive device(s) Comments: uses RW or cane as he feels he needs it Communication Communication: No difficulties    Cognition  Cognition Arousal/Alertness: Awake/alert Behavior During Therapy:  WFL for tasks assessed/performed Overall Cognitive Status: Within Functional Limits for tasks assessed    Extremity/Trunk Assessment Upper Extremity Assessment Upper  Extremity Assessment: Generalized weakness Lower Extremity Assessment Lower Extremity Assessment: Generalized weakness Cervical / Trunk Assessment Cervical / Trunk Assessment: Normal   Balance    End of Session PT - End of Session Equipment Utilized During Treatment: Gait belt;Oxygen Activity Tolerance: Patient tolerated treatment well Patient left: in bed;with family/visitor present;with call bell/phone within reach Nurse Communication: Mobility status  GP     Todd [name] 09/03/2013, 2:03 PM

## 2013-09-03 NOTE — Progress Notes (Signed)
FMTS Primary Physician Note  I saw and examined Todd [name] today, I discussed the plan with Dr Parks Ranger and I agree with his management and plan.  Mr. Bornemann reports feeling markedly improved this morning; less dyspneic than he was prior to admission.  Has noticed his weight creeping up in recent weeks.  Reviewed CXR with findings of left-sided basilar infiltrate.  I agree with treatment for HCAP.  There is likely an element of fluid retention that is adding to his recent episodes of dyspnea with mild exertion. To continue to watch serum Cr-- possibly explained by acute infection (borderline hypotension SBP 100 documented since admission). Appreciate Cardiology consult. [name], MD

## 2013-09-03 NOTE — Consult Note (Signed)
Reason for Consult: SOB  Requesting Physician: Dr Andria Frames  HPI: This is a 80 y.o. male with a past medical history significant for CAD and an ischemic cardiomyopathy. He had a large AWMI in [place] in the 90's, Rx'd with TPA. His last EF was 35-40%. He is followed by Dr Gwenlyn Found. His other problems include hypertension and hyperlipidemia as well as gout. He was diagnosed with colon cancer last year and underwent resection by Dr. Alphonsa Overall with a colostomy. He has undergone chemotherapy administered by Dr. Alen Blew.  In Sept 2014 he was admitted and found to have a pulmonary embolism. He is on chronic Xarelto. He was recently admitted through the ER with epistaxis and sent home. He returned within 24 hours SOB and febrile. His BNP was 9281. His family notes increasing wgt and LE edema the past few days.   PMHx:  Past Medical History  Diagnosis Date  . Myocardial infarction 1990    "Massive"- total LAD with collaterals  . Gout   . Hypertension   . Arthritis, rheumatoid   . Diabetes mellitus without complication   . CHF (congestive heart failure)   . BPH (benign prostatic hyperplasia)   . Neuropathy   . Hyperlipidemia   . Ischemic cardiomyopathy March 2012    EF 31% Myoview. Pt declines ICD  . Colon cancer 2014    Surg, chemo March 2014   Past Surgical History  Procedure Laterality Date  . Cataract extraction    . Flexible sigmoidoscopy N/A 09/05/2012    Procedure: FLEXIBLE SIGMOIDOSCOPY;  Surgeon: Missy Sabins, MD;  Location: [facility];  Service: Endoscopy;  Laterality: N/A;  Give 1000 cc tapwater enema on call to endoscopy.  . Colon resection N/A 09/07/2012    Procedure: LAPAROSCOPIC ASSISTED SIGMOID COLON RESECTION END OSTOMY;  Surgeon: Shann Medal, MD;  Location: [facility];  Service: General;  Laterality: N/A;  . Portacath placement N/A 09/07/2012    Procedure: Placement of power port;  Surgeon: Shann Medal, MD;  Location: [facility];  Service: General;  Laterality: N/A;    FAMHx:  DM, stroke   SOCHx:  reports that he quit smoking about 4 years ago. He has never used smokeless tobacco. He reports that he does not drink alcohol or use illicit drugs.  ALLERGIES: No Known Allergies  ROS: Pertinent items are noted in HPI. see H&P for complete details  HOME MEDICATIONS: Prescriptions prior to admission  Medication Sig Dispense Refill  . aspirin EC 81 MG tablet Take 81 mg by mouth daily.      . benazepril (LOTENSIN) 10 MG tablet Take 10 mg by mouth daily.      . chlorhexidine (PERIDEX) 0.12 % solution Use as directed 15 mLs in the mouth or throat 2 (two) times daily as needed (for mouth irritation).      Marland Kitchen diclofenac (VOLTAREN) 75 MG EC tablet Take 75 mg by mouth 2 (two) times daily.      . diclofenac sodium (VOLTAREN) 1 % GEL Apply 2 g topically 3 (three) times daily as needed (for pain).      Marland Kitchen diphenoxylate-atropine (LOMOTIL) 2.5-0.025 MG per tablet Take 2 tablets by mouth 4 (four) times daily as needed for diarrhea or loose stools.      . doxazosin (CARDURA) 8 MG tablet Take 4 mg by mouth 2 (two) times daily.      Marland Kitchen gabapentin (NEURONTIN) 300 MG capsule Take 300 mg by mouth daily.      . hydrochlorothiazide (HYDRODIURIL) 25  MG tablet Take 25 mg by mouth daily.      . isosorbide mononitrate (IMDUR) 60 MG 24 hr tablet Take 60 mg by mouth daily.      Marland Kitchen lidocaine-prilocaine (EMLA) cream Apply 1 application topically as needed (to port).      . metoprolol succinate (TOPROL-XL) 50 MG 24 hr tablet Take 50 mg by mouth daily. Take with or immediately following a meal.      . Multiple Vitamin (MULTIVITAMIN) tablet Take 1 tablet by mouth daily.        . mupirocin cream (BACTROBAN) 2 % Apply 1 application topically 2 (two) times daily as needed (for skin irritation around stoma).      . nitroGLYCERIN (NITROSTAT) 0.4 MG SL tablet Place 0.4 mg under the tongue every 5 (five) minutes as needed for chest pain.      . Nutritional Supplements (ENSURE PO) Take 1 Can by mouth daily.       [name] Glycol-Propyl Glycol (SYSTANE OP) Place 1 drop into both eyes daily.       . Rivaroxaban (XARELTO) 20 MG TABS tablet Take 20 mg by mouth daily with breakfast.       . simvastatin (ZOCOR) 40 MG tablet Take 20 mg by mouth every evening.      . sodium chloride (OCEAN) 0.65 % SOLN nasal spray Place 1 spray into both nostrils as needed for congestion.      . torsemide (DEMADEX) 20 MG tablet Take 30 mg by mouth daily.      . vitamin E 400 UNIT capsule Take 400 Units by mouth daily.        HOSPITAL MEDICATIONS: I have reviewed the patient's current medications.  VITALS: Blood pressure 144/55, pulse 89, temperature 98.5 F (36.9 C), temperature source Oral, resp. rate 20, height _0  (1.753 m), weight 209 lb 10.5 oz (95.1 kg), SpO2 91.00%.  PHYSICAL EXAM: General appearance: alert, cooperative, no distress, mildly obese and pale Neck: no carotid bruit and no JVD Lungs: scattered rhonchi  Heart: regular rate and rhythm and 2/6 systolic murmur LSB Abdomen: obese Extremities: trace edema Pulses: 2+ and symmetric Skin: pale cool dry Neurologic: Grossly normal  LABS: Results for orders placed during the hospital encounter of 09/02/13 (from the past 48 hour(s))  PRO B NATRIURETIC PEPTIDE     Status: Abnormal   Collection Time    09/02/13 12:15 PM      Result Value Range   Pro B Natriuretic peptide (BNP) 9281.0 (*) 0 - 450 pg/mL  CBC     Status: Abnormal   Collection Time    09/02/13 12:15 PM      Result Value Range   WBC 6.1  4.0 - 10.5 K/uL   RBC 2.99 (*) 4.22 - 5.81 MIL/uL   Hemoglobin 9.0 (*) 13.0 - 17.0 g/dL   HCT 27.6 (*) 39.0 - 52.0 %   MCV 92.3  78.0 - 100.0 fL   MCH 30.1  26.0 - 34.0 pg   MCHC 32.6  30.0 - 36.0 g/dL   RDW 15.1  11.5 - 15.5 %   Platelets 213  150 - 400 K/uL  BASIC METABOLIC PANEL     Status: Abnormal   Collection Time    09/02/13 12:15 PM      Result Value Range   Sodium 138  137 - 147 mEq/L   Potassium 3.6 (*) 3.7 - 5.3 mEq/L   Chloride  97  96 - 112 mEq/L   CO2 25  19 - 32 mEq/L   Glucose, Bld 161 (*) 70 - 99 mg/dL   BUN 31 (*) 6 - 23 mg/dL   Creatinine, Ser 1.50 (*) 0.50 - 1.35 mg/dL   Calcium 8.8  8.4 - 10.5 mg/dL   GFR calc non Af Amer 42 (*) >90 mL/min   GFR calc Af Amer 49 (*) >90 mL/min   Comment: (NOTE)     The eGFR has been calculated using the CKD EPI equation.     This calculation has not been validated in all clinical situations.     eGFR's persistently <90 mL/min signify possible Chronic Kidney     Disease.  INFLUENZA PANEL BY PCR (TYPE A & B, H1N1)     Status: None   Collection Time    09/02/13 12:31 PM      Result Value Range   Influenza A By PCR NEGATIVE  NEGATIVE   Influenza B By PCR NEGATIVE  NEGATIVE   H1N1 flu by pcr NOT DETECTED  NOT DETECTED   Comment:            The Xpert Flu assay (FDA approved for     nasal aspirates or washes and     nasopharyngeal swab specimens), is     intended as an aid in the diagnosis of     influenza and should not be used as     a sole basis for treatment.     Performed at [facility]  CG4 I-STAT (LACTIC ACID)     Status: None   Collection Time    09/02/13 12:41 PM      Result Value Range   Lactic Acid, Venous 1.05  0.5 - 2.2 mmol/L  STREP PNEUMONIAE URINARY ANTIGEN     Status: None   Collection Time    09/02/13  7:06 PM      Result Value Range   Strep Pneumo Urinary Antigen NEGATIVE  NEGATIVE   Comment:            Infection due to S. pneumoniae     cannot be absolutely ruled out     since the antigen present     may be below the detection limit     of the test.  HIV ANTIBODY (ROUTINE TESTING)     Status: None   Collection Time    09/02/13  8:19 PM      Result Value Range   HIV NON REACTIVE  NON REACTIVE   Comment: Performed at [place], BLOOD (ROUTINE X 2)     Status: None   Collection Time    09/02/13  8:19 PM      Result Value Range   Specimen Description BLOOD LEFT ARM     Special Requests BOTTLES DRAWN AEROBIC  AND ANAEROBIC 10CC     Culture  Setup Time       Value: 09/02/2013 23:31     Performed at [facility]   Culture       Value:        BLOOD CULTURE RECEIVED NO GROWTH TO DATE CULTURE WILL BE HELD FOR 5 DAYS BEFORE ISSUING A FINAL NEGATIVE REPORT     Performed at [facility]   Report Status PENDING    CULTURE, BLOOD (ROUTINE X 2)     Status: None   Collection Time    09/02/13  8:24 PM      Result Value Range   Specimen Description BLOOD LEFT ARM  Special Requests BOTTLES DRAWN AEROBIC ONLY 10CC     Culture  Setup Time       Value: 09/02/2013 23:31     Performed at [facility]   Culture       Value:        BLOOD CULTURE RECEIVED NO GROWTH TO DATE CULTURE WILL BE HELD FOR 5 DAYS BEFORE ISSUING A FINAL NEGATIVE REPORT     Performed at [facility]   Report Status PENDING    BASIC METABOLIC PANEL     Status: Abnormal   Collection Time    09/03/13  8:10 AM      Result Value Range   Sodium 135 (*) 137 - 147 mEq/L   Potassium 3.9  3.7 - 5.3 mEq/L   Chloride 97  96 - 112 mEq/L   CO2 24  19 - 32 mEq/L   Glucose, Bld 118 (*) 70 - 99 mg/dL   BUN 32 (*) 6 - 23 mg/dL   Creatinine, Ser 1.55 (*) 0.50 - 1.35 mg/dL   Calcium 8.4  8.4 - 10.5 mg/dL   GFR calc non Af Amer 40 (*) >90 mL/min   GFR calc Af Amer 47 (*) >90 mL/min   Comment: (NOTE)     The eGFR has been calculated using the CKD EPI equation.     This calculation has not been validated in all clinical situations.     eGFR's persistently <90 mL/min signify possible Chronic Kidney     Disease.  CBC     Status: Abnormal   Collection Time    09/03/13  8:10 AM      Result Value Range   WBC 5.9  4.0 - 10.5 K/uL   RBC 3.09 (*) 4.22 - 5.81 MIL/uL   Hemoglobin 9.4 (*) 13.0 - 17.0 g/dL   HCT 27.7 (*) 39.0 - 52.0 %   MCV 89.6  78.0 - 100.0 fL   MCH 30.4  26.0 - 34.0 pg   MCHC 33.9  30.0 - 36.0 g/dL   RDW 15.4  11.5 - 15.5 %   Platelets 193  150 - 400 K/uL    EKG:   IMAGING: Dg Chest 2  View  09/02/2013   CLINICAL DATA:  Cough and shortness of breath  EXAM: CHEST  2 VIEW  COMPARISON:  August 31, 2013  FINDINGS: There is focal infiltrate in the left and right bases, somewhat more consolidated on the left than on the right. Elsewhere lungs are clear. Heart is borderline enlarged with normal pulmonary vascularity. Central catheter tip is in the superior vena cava near the cavoatrial junction. No pneumothorax. No adenopathy. No bone lesions.  IMPRESSION: Patchy bibasilar infiltrate, slightly more on the left than on the right.   Electronically Signed   By: Lowella Grip M.D.   On: 09/02/2013 12:05    IMPRESSION: Active Problems:   Acute on chronic combined systolic and diastolic congestive heart failure   HCAP (healthcare-associated pneumonia)   DIABETES MELLITUS, CONTROLLED, WITHOUT COMPLICATIONS   Ischemic cardiomyopathy, EF 35-40% by echo March 2014   CAD, Ant MI 1990- total LAD with collaterals, Myoview low risk 3/12   Colon cancer metastasized to liver   Acute pulmonary embolism- 03/28/22   Diastolic dysfunction- grade 24 September 2012   HYPERLIPIDEMIA   HYPERTENSION   Myocardial infarction'1995   RECOMMENDATION: MD to see. He is not currently on a diuretic.   Time Spent Directly with Patient: 45 minutes  Erlene Quan [phone] beeper 09/03/2013,  1:39 PM  Agree with note written by Kerin Ransom PAC  Pt well known to me. I last saw him in the office 10/14. H/O ISCM with EF in the 35% range. Admitted with increased SOB, weight gain (monitors this closely at home) and BLE edema. BNP 9K. Exam benign except for 1+ edema (says this looks better). SCr increased from 1 to 1.5. Diuretics on hold. On Xarelto for PE. Would put back on demadex and follow weight, SCR and BNP. Will follow with you.   Lorretta Harp 09/03/2013 2:33 PM

## 2013-09-03 NOTE — Progress Notes (Signed)
Pharmacist Heart Failure Core Measure Documentation  Assessment: Todd [name] has an EF documented as 35-40% on 10/17/12 by ECHO.  Rationale: Heart failure patients with left ventricular systolic dysfunction (LVSD) and an EF < 40% should be prescribed an angiotensin converting enzyme inhibitor (ACEI) or angiotensin receptor blocker (ARB) at discharge unless a contraindication is documented in the medical record.  This patient is not currently on an ACEI or ARB for HF.  This note is being placed in the record in order to provide documentation that a contraindication to the use of these agents is present for this encounter.  ACE Inhibitor or Angiotensin Receptor Blocker is contraindicated (specify all that apply)  []   ACEI allergy AND ARB allergy []   Angioedema []   Moderate or severe aortic stenosis []   Hyperkalemia []   Hypotension []   Renal artery stenosis [x]   Worsening renal function, preexisting renal disease or dysfunction   Albertina Parr, PharmD.  Clinical Pharmacist Pager [phone]

## 2013-09-03 NOTE — Telephone Encounter (Signed)
Dr. Marlyne Beards from [facility] medicine called to inform office that patient is currently admitted at [facility]. States patient will not be discharged in time for chemo tx that is sched tomorrow. Dr. Janett Billow pager [phone] for contact with further questions.  MD informed. Appts cancelled for 02/10 lab/NP/tx

## 2013-09-03 NOTE — Progress Notes (Signed)
Family Medicine Teaching Service Daily Progress Note Intern Pager: [phone]  Patient name: Coben Godshall Medical record number: [id number] Date of birth: Jan 11, 1930 Age: 80 y.o. Gender: male  Primary Care Provider: Willeen Niece, MD Consultants: none Code Status: DNR (confirmed on admission)  Pt Overview and Major Events to Date:  Assessment and Plan: Trapper Meech is a 80 y.o. male presenting with worsening shortness of breath, cough, and fever (101.37F), found to have bibasilar infiltrates consistent with HCAP (in setting of recent chemotherapy). PMH is significant for Metastatic Colon Cancer (Stage IV) s/p sigmoid resection with palliative chemotherapy, H/o hypercoagulability provoked PE (03/2013), CAD s/p MI, combined CHF, HTN, HLD, OA, RA   # Presumed Bibasilar HCAP, in setting of recent IV Chemotherapy  Presentation clinically consistent with PNA (cough, fever 101.7, focal crackles/rhonchi findings bibasilar), CXR with worsening bibasilar L>R patchy infiltrates (compared to prior 08/31/13). Considered HCAP given recent IV chemotherapy.  - telemetry, closely monitor VS, O2 saturation (on 2L O2 o/n) - continue antibiotics for HCAP coverage (s/p CTX and Azithro x 1 dose):       - Vancomycin / Cefepime, per pharm (2/8>>)  - f/u blood cultures x 2 (currently NGTD) - influenza panel (negative)  - Check urine antigens - strep pneumo (negative), legionella (pending) - HIV (non-reactive)    # Combined CHF, secondary to h/o ischemic cardiomyopathy  Last ECHO (10/17/12) with reduced EF 35-40% d/t severe hypokinesis, Grade 2 Diastolic dysfxn, mild-mod AR, mod-severe MR  Current volume status is overall difficult to determine, clinically patient appears to be mildly volume overloaded with inc wt to 209 (vs dry wt 194), LE edema is notable and but unclear if at baseline, focal lung findings with bibasilar crackles likely attributed to PNA. ProBNP would suggest volume overload with 9281 (vs 3523, 2 days  ago). However, given AKI and BUN:Cr >20, suggest pt is dry. - closely monitor fluid status  - daily wt / strict I/Os, increased wt 205-->209, UOP ~ 1L < 24 hrs - repeat 2D ECHO  - Hold diuretics, home torsemide 20mg  daily, until his renal function improves, then plan diuresis - c/s Cardiology re: assistance with diuresis in established patient - greatly appreciate recommendations      - Followed by Dr. Gwenlyn Found, Cardiology outpatient. Plan to see patient today  # H/o CAD  S/p MI with stent placement in 1990 (documented as LAD)  - Continue ASA 81mg  daily  - EKG, NSR with PVCs, no acute ST-T wave changes  # Colon Cancer (Stage IV), with mets to liver  S/p sigmoid resection, currently followed for palliative chemotherapy by Dr. Alen Blew. Recently has been receiving chemotherapy q 2 weeks, with chemo break during January 2015. - Due for chemotherapy on 09/04/13 - Contacted Dr. Hazeline Junker office, left message with nurse re: pt currently hospitalized  # Chronic hypercoagulability, secondary to malignancy  H/o provoked PE (03/2013), currently on anticoagulation with Xarelto  - continue home Xarelto 20mg  daily, CrCl >30 (calculated at 44)  # AKI, likely secondary to pre-renal etiology in setting of CKD (Stage III)  Baseline Cr about 1.00 - elevated Cr 1.50-->1.55. BUN 32, with BUN:Cr >20 suspect pre-renal dehydration etiology - f/u daily BMET, Cr trend  - SLIV  - Hold nephrotoxic agents, Benazepril 10mg  daily, NSAIDs  # HTN  - BP 100-140s / 50s today  - Hold Doxazosin 4mg  BID, HCTZ 25mg  daily, Imdur 60mg  24 hr,  # HLD  - Continue simvastatin 20mg  daily  # H/o arthritis  - Continue Gabapentin 300mg  daily  -  Hold diclofenac given AKI  FEN/GI: SLIV, Heart Diet, Protonix 40mg  PO daily  Prophylaxis: No VTE prophylaxis - therapeutic anticoagulation with Xarelto  Disposition: Admitted to [facility], inpatient status for HCAP, pending complete diagnostic work-up and improving clinical course, expect that  patient will remain hospitalized for >2 days  Subjective: No acute events overnight. Patient with wife at bedside, report that he is feeling "better than yesterday", slept decently except did wake up with coughing spell. Overall, shortness of breath has improved, but persistent post-nasal drip and cough. Notes feels good UOP (5-6x yesterday) but urine is darker than usual. Denies CP, fever/chills.  Objective: Temp:  [97.9 F (36.6 C)-99.4 F (37.4 C)] 98.5 F (36.9 C) (02/09 0500) Pulse Rate:  [67-89] 89 (02/09 0500) Resp:  [19-20] 20 (02/09 0500) BP: (102-144)/(47-55) 144/55 mmHg (02/09 0500) SpO2:  [91 %-98 %] 91 % (02/09 0500) FiO2 (%):  [2 %] 2 % (02/08 1530) Weight:  [205 lb 7.5 oz (93.2 kg)-209 lb 10.5 oz (95.1 kg)] 209 lb 10.5 oz (95.1 kg) (02/09 0500) Physical Exam: General: chronically ill, 76 yr M, sitting up in bed with wife at bedside, pleasant and conversational, NAD  HEENT: patent nares b/l without congestion, oropharynx clear, mild dry MM  Cardiovascular: RRR, 2/6 systolic murmur loudest over L-sternal border / apex  Respiratory: Mostly CTAB with exception of bibasilar crackles and rhonchi R>L. No wheezing heard. Normal work of breathing, no tachypnea or respiratory distress  Abdomen: soft, NTND, +active BS, ostomy bag in place, ostomy tissue appears pink and appropriate  Extremities: +1 pitting bilateral LE edema up mid shin, non-tender, moves all ext, compression stockings in place  Skin: warm, dry, no rashes, large seborrheic dermatosis L-forehead  Neuro: awake, alert, oriented, grossly non-focal and strength intact  Laboratory:  Recent Labs Lab 08/31/13 1757 09/02/13 1215 09/03/13 0810  WBC 5.6 6.1 5.9  HGB 9.6* 9.0* 9.4*  HCT 29.9* 27.6* 27.7*  PLT 229 213 193    Recent Labs Lab 08/31/13 1757 09/02/13 1215 09/03/13 0810  NA 137 138 135*  K 4.0 3.6* 3.9  CL 98 97 97  CO2 25 25 24   BUN 34* 31* 32*  CREATININE 1.50* 1.50* 1.55*  CALCIUM 8.9 8.8 8.4   GLUCOSE 169* 161* 118*   Lactic Acid - 1.05  ProBNP 9281 (last 3523 on 2/6)  Coags: INR 2.52 (on 2/6) PT 26.3 (on 2/6) HIV (non-reactive) Legionella ur ag (pending) Strep Pneumo ur ag (negative)  Blood Culture x 2 - 2/8 (currently NGTD)  Imaging/Diagnostic Tests:  2/8 EKG  NSR with PVCs, no acute ST-T wave changes  2/8 CXR 2v  IMPRESSION:  Patchy bibasilar infiltrate, slightly more on the left than on the  right.  Nobie Putnam, DO 09/03/2013, 12:33 PM PGY-1, [place] Intern pager: [phone], text pages welcome

## 2013-09-04 ENCOUNTER — Other Ambulatory Visit: Payer: Medicare Other

## 2013-09-04 ENCOUNTER — Inpatient Hospital Stay: Payer: Medicare Other

## 2013-09-04 ENCOUNTER — Ambulatory Visit: Payer: Medicare Other | Admitting: Oncology

## 2013-09-04 DIAGNOSIS — I2699 Other pulmonary embolism without acute cor pulmonale: Secondary | ICD-10-CM

## 2013-09-04 DIAGNOSIS — J189 Pneumonia, unspecified organism: Secondary | ICD-10-CM | POA: Diagnosis not present

## 2013-09-04 DIAGNOSIS — N179 Acute kidney failure, unspecified: Secondary | ICD-10-CM | POA: Diagnosis not present

## 2013-09-04 DIAGNOSIS — I2782 Chronic pulmonary embolism: Secondary | ICD-10-CM

## 2013-09-04 LAB — BASIC METABOLIC PANEL
BUN: 33 mg/dL — ABNORMAL HIGH (ref 6–23)
CALCIUM: 8.4 mg/dL (ref 8.4–10.5)
CO2: 24 mEq/L (ref 19–32)
Chloride: 98 mEq/L (ref 96–112)
Creatinine, Ser: 1.64 mg/dL — ABNORMAL HIGH (ref 0.50–1.35)
GFR calc Af Amer: 44 mL/min — ABNORMAL LOW (ref >90)
GFR, EST NON AFRICAN AMERICAN: 38 mL/min — AB (ref >90)
Glucose, Bld: 117 mg/dL — ABNORMAL HIGH (ref 70–99)
Potassium: 3.8 mEq/L (ref 3.7–5.3)
Sodium: 138 mEq/L (ref 137–147)

## 2013-09-04 LAB — EXPECTORATED SPUTUM ASSESSMENT W GRAM STAIN, RFLX TO RESP C

## 2013-09-04 LAB — EXPECTORATED SPUTUM ASSESSMENT W REFEX TO RESP CULTURE

## 2013-09-04 MED ORDER — METOPROLOL SUCCINATE ER 50 MG PO TB24
50.0000 mg | ORAL_TABLET | Freq: Every day | ORAL | Status: DC
  Administered 2013-09-04 – 2013-09-05 (×2): 50 mg via ORAL
  Filled 2013-09-04 (×2): qty 1

## 2013-09-04 MED ORDER — LEVOFLOXACIN 750 MG PO TABS
750.0000 mg | ORAL_TABLET | ORAL | Status: DC
  Administered 2013-09-04: 750 mg via ORAL
  Filled 2013-09-04: qty 1

## 2013-09-04 NOTE — Progress Notes (Signed)
Family Medicine Teaching Service Attending Note  I interviewed and examined patient Todd [name] and reviewed their tests and x-rays.  I discussed with Dr. Raliegh Ip and reviewed their note for today.  I agree with their assessment and plan.     Additionally  Stable from pneumonia standpoint - ok to change to broadspectrum oral antibiotics If renal function does not improve would check [place] looking for obstruction Appreciate cardiology help with his volume status

## 2013-09-04 NOTE — Progress Notes (Signed)
Family Medicine Teaching Service Daily Progress Note Intern Pager: [phone]  Patient name: Todd [name] Medical record number: [id number] Date of birth: [birth year] Age: 80 y.o. Gender: male  Primary Care Provider: Willeen Niece, MD Consultants: Cardiology, Oncology (informally via phone, Dr. Alen Blew) Code Status: DNR (confirmed on admission)  Pt Overview and Major Events to Date:  Assessment and Plan: Todd [name] is a 80 y.o. male presenting with worsening shortness of breath, cough, and fever (101.58F), found to have bibasilar infiltrates consistent with HCAP (in setting of recent chemotherapy). PMH is significant for Metastatic Colon Cancer (Stage IV) s/p sigmoid resection with palliative chemotherapy, H/o hypercoagulability provoked PE (03/2013), CAD s/p MI, combined CHF, HTN, HLD, OA, RA   # Presumed Bibasilar HCAP, in setting of recent IV Chemotherapy  Presentation clinically consistent with PNA (cough, fever 101.7, focal crackles/rhonchi findings bibasilar), CXR with worsening bibasilar L>R patchy infiltrates (compared to prior 08/31/13). Considered HCAP given recent IV chemotherapy.  - telemetry, closely monitor VS, O2 saturation (on 2L O2 o/n) - continue antibiotics for HCAP coverage (s/p CTX and Azithro x 1 dose):             - DC'd Vancomycin / Cefepime, per pharm (2/8>>2/10)        - Start Levaquin PO 750mg  q 48 hr (2/10>>), complete total 14 day course of abx - f/u blood cultures x 2 (currently NGTD) - influenza panel (negative)  - Check urine antigens - strep pneumo (negative), legionella (negative) - HIV (non-reactive)    # Combined CHF, secondary to h/o ischemic cardiomyopathy  Last ECHO (10/17/12) with reduced EF 35-40% d/t severe hypokinesis, Grade 2 Diastolic dysfxn, mild-mod AR, mod-severe MR  Current volume status is overall difficult to determine, clinically patient appears to be mildly volume overloaded with inc wt to 209 (vs dry wt 194), LE edema is notable and but  unclear if at baseline, focal lung findings with bibasilar crackles likely attributed to PNA. ProBNP would suggest volume overload with 9281 (vs 3523, 2 days ago). However, given AKI and BUN:Cr >20, suggest pt is dry. - closely monitor fluid status  - daily wt / strict I/Os, increased wt 205>>2013 (bedscale wt, inaccurate?), UOP ~ 1.5L < 24 hrs - re-weigh standing scale at 209 - repeat 2D ECHO  - Hold diuretics, home torsemide 20mg  daily, until his renal function improves, then plan diuresis - c/s Cardiology re: assistance with diuresis in established patient - greatly appreciate recommendations      - Followed by Dr. Gwenlyn Found, Cardiology outpatient      - Continue to hold Torsemide 20mg  daily, clinically not volume overloaded, monitor Cr, wt, fluid status      - resume BB, hold ACEi. If weight persistently increasing, consider IV diuretics  # H/o CAD  S/p MI with stent placement in 1990 (documented as LAD)  - Continue ASA 81mg  daily  - EKG, NSR with PVCs, no acute ST-T wave changes  # Colon Cancer (Stage IV), with mets to liver  S/p sigmoid resection, currently followed for palliative chemotherapy by Dr. Alen Blew. Recently has been receiving chemotherapy q 2 weeks, with chemo break during January 2015. - Due for chemotherapy on 09/04/13, notified Dr. Hazeline Junker office regarding patient's current hospitalized status - contacted Dr. Alen Blew re: worsening kidney function, to determine if warranted for imaging surveillance vs renal US     - Discussed case, and Dr. Alen Blew does not believe decreased renal function is related to his cancer or chemotherapy. He does not recommend pursuing any CT  surveillance at this time. If needed, he agrees with plan to get Renal US to eval for possible hydronephrosis. If no etiology discovered and continued worsening, we could consider Renal consult while inpatient     - Postponed current chemotherapy plans, will contact patient to arrange resuming therapy on discharge. No  further need to schedule or make arrangements prior to discharge.  # Chronic hypercoagulability, secondary to malignancy  H/o provoked PE (03/2013), currently on anticoagulation with Xarelto  - continue home Xarelto 20mg  daily, CrCl >30 (calculated at 44)  # AKI, likely secondary to pre-renal etiology in setting of CKD (Stage III)  Baseline Cr about 1.00 - elevated Cr 1.50-->1.55-->1.64. BUN 33, with BUN:Cr >20 suspect pre-renal dehydration etiology - f/u daily BMET, Cr trend  - SLIV  - Hold nephrotoxic agents, Benazepril 10mg  daily, NSAIDs  # HTN  - BP 100-140s / 50s today - Resume Toprol-XL 50mg  daily - Hold Doxazosin 4mg  BID, HCTZ 25mg  daily, Imdur 60mg  24 hr,  # HLD  - Continue simvastatin 20mg  daily  # H/o arthritis  - Continue Gabapentin 300mg  daily  - Hold diclofenac given AKI  FEN/GI: SLIV, Heart Diet, Protonix 40mg  PO daily  Prophylaxis: No VTE prophylaxis - therapeutic anticoagulation with Xarelto  Disposition: Admitted to [place], inpatient status for HCAP, pending complete diagnostic work-up and improving clinical course, expect that patient will remain hospitalized for 1-2 more days, pending improved renal function / volume status, and continued improvement on antibiotics for PNA.  Subjective: No acute events overnight. Reports didn't sleep well last night, frequent nocturia some coughing. Overall still feels better today, continues with significantly improved breathing, occasional cough. Denies fever/chills, HA, CP, abd pain. Tolerating diet and ambulation well. Good ostomy output. No other concerns, understands current plan.   Objective: Temp:  [98.5 F (36.9 C)-100.1 F (37.8 C)] 98.5 F (36.9 C) (02/10 0525) Pulse Rate:  [76-89] 76 (02/10 1300) Resp:  [20] 20 (02/10 1300) BP: (145-160)/(56-76) 147/67 mmHg (02/10 1300) SpO2:  [94 %-98 %] 98 % (02/10 1300) Weight:  [209 lb 8 oz (95.029 kg)-213 lb 6.5 oz (96.8 kg)] 209 lb 8 oz (95.029 kg) (02/10 6440) Physical  Exam: General: chronically ill, 35 yr M, sitting up at bedside, pleasant and conversational, NAD  HEENT: patent nares with some dryness b/l without congestion, oropharynx clear, improved moist MM  Cardiovascular: RRR, 2/6 systolic murmur loudest over L-sternal border / apex  Respiratory: Mostly CTAB with exception of stable bibasilar crackles and rhonchi R>L. No wheezing heard. Normal work of breathing, no tachypnea or respiratory distress  Abdomen: soft, NTND, +active BS, ostomy bag in place with normal appearing stool Extremities: trace pitting bilateral LE edema up mid shin, non-tender, moves all ext, compression stockings in place  Skin: warm, dry, no rashes, large seborrheic dermatosis L-forehead  Neuro: awake, alert, oriented  Laboratory:  Recent Labs Lab 08/31/13 1757 09/02/13 1215 09/03/13 0810  WBC 5.6 6.1 5.9  HGB 9.6* 9.0* 9.4*  HCT 29.9* 27.6* 27.7*  PLT 229 213 193    Recent Labs Lab 09/02/13 1215 09/03/13 0810 09/04/13 0508  NA 138 135* 138  K 3.6* 3.9 3.8  CL 97 97 98  CO2 25 24 24   BUN 31* 32* 33*  CREATININE 1.50* 1.55* 1.64*  CALCIUM 8.8 8.4 8.4  GLUCOSE 161* 118* 117*   Lactic Acid - 1.05  ProBNP 9281 (last 3523 on 2/6)  Coags: INR 2.52 (on 2/6) PT 26.3 (on 2/6) HIV (non-reactive) Legionella ur ag (negative) Strep Pneumo ur  ag (negative)  Blood Culture x 2 - 2/8 (currently NGTD)  Imaging/Diagnostic Tests:  2/8 EKG  NSR with PVCs, no acute ST-T wave changes  2/8 CXR 2v  IMPRESSION:  Patchy bibasilar infiltrate, slightly more on the left than on the  right.  Nobie Putnam, DO 09/04/2013, 2:45 PM PGY-1, [place] Intern pager: [phone], text pages welcome

## 2013-09-04 NOTE — Progress Notes (Signed)
Physical Therapy Treatment Patient Details Name: Todd [name] MRN: [id number] DOB: 1929-11-28 Today's Date: 09/04/2013 Time: 2951-8841 PT Time Calculation (min): 16 min  PT Assessment / Plan / Recommendation  History of Present Illness Todd [name] is a 80 y.o. male presenting with worsening shortness of breath, cough, and fever (101.76F), found to have bibasilar infiltrates consistent with HCAP (in setting of recent chemotherapy). PMH is significant for Metastatic Colon Cancer (Stage IV) s/p sigmoid resection with palliative chemotherapy, H/o hypercoagulability provoked PE (03/2013), CAD s/p MI, combined CHF, HTN, HLD, OA, RA     PT Comments   Pt making good progress.  Follow Up Recommendations  Home health PT;Supervision/Assistance - 24 hour     Does the patient have the potential to tolerate intense rehabilitation     Barriers to Discharge        Equipment Recommendations  None recommended by PT    Recommendations for Other Services    Frequency Min 3X/week   Progress towards PT Goals Progress towards PT goals: Progressing toward goals  Plan Current plan remains appropriate    Precautions / Restrictions Precautions Precautions: Fall   Pertinent Vitals/Pain See flow sheet.    Mobility  Transfers Overall transfer level: Needs assistance Equipment used: Rolling walker (2 wheeled) Sit to Stand: Supervision Ambulation/Gait Ambulation/Gait assistance: Min guard Ambulation Distance (Feet): 300 Feet Assistive device: Rolling walker (2 wheeled) Gait Pattern/deviations: Step-through pattern;Decreased stride length;Trunk flexed General Gait Details: Verbal cues to stay closer to  walker. Pt uses rollator at home which is easier to steer.    Exercises     PT Diagnosis:    PT Problem List:   PT Treatment Interventions:     PT Goals (current goals can now be found in the care plan section)    Visit Information  Last PT Received On: 09/04/13 Assistance Needed: +1 History of  Present Illness: Todd [name] is a 80 y.o. male presenting with worsening shortness of breath, cough, and fever (101.76F), found to have bibasilar infiltrates consistent with HCAP (in setting of recent chemotherapy). PMH is significant for Metastatic Colon Cancer (Stage IV) s/p sigmoid resection with palliative chemotherapy, H/o hypercoagulability provoked PE (03/2013), CAD s/p MI, combined CHF, HTN, HLD, OA, RA      Subjective Data      Cognition  Cognition Arousal/Alertness: Awake/alert Behavior During Therapy: WFL for tasks assessed/performed Overall Cognitive Status: Within Functional Limits for tasks assessed    Balance  Balance Overall balance assessment: Needs assistance Standing balance support: No upper extremity supported Standing balance-Leahy Scale: Fair  End of Session PT - End of Session Equipment Utilized During Treatment: Gait belt Activity Tolerance: Patient tolerated treatment well Patient left: in chair;with call bell/phone within reach;with family/visitor present Nurse Communication: Mobility status   GP     [facility] 09/04/2013, 3:39 PM  [facility] PT [phone]

## 2013-09-04 NOTE — Care Management Note (Signed)
    Page 1 of 1   09/05/2013     5:32:58 PM   CARE MANAGEMENT NOTE 09/05/2013  Patient:  Todd [name], Todd [name]   Account Number:  [id number]  Date Initiated:  09/04/2013  Documentation initiated by:  Tomi Bamberger  Subjective/Objective Assessment:   dx pna  admit- lives with spouse.     Action/Plan:   pt eval- recs hhpt   Anticipated DC Date:  09/05/2013   Anticipated DC Plan:  [place]  CM consult      [facility] Choice  HOME HEALTH   Choice offered to / List presented to:  C-1 Patient        [place] arranged  [place] [facility]      [place].   Status of service:  Completed, signed off Medicare Important Message given?   (If response is "NO", the following Medicare IM given date fields will be blank) Date Medicare IM given:   Date Additional Medicare IM given:    Discharge Disposition:  [place]  Per UR Regulation:  Reviewed for med. necessity/level of care/duration of stay  If discussed at [place] of Stay Meetings, dates discussed:    Comments:  09/04/13 [place], BSN [phone] patient lives with spouse, per physical therapy recs hhpt, patient and wife [facility] , referral made to [facility] for hhpt, Butch Penny notified.  Soc will begin 24-48 hrs post discharge.

## 2013-09-04 NOTE — Progress Notes (Signed)
Patient Name: Todd [name] Date of Encounter: 09/04/2013   Principal Problem:   HCAP (healthcare-associated pneumonia) Active Problems:   Ischemic cardiomyopathy, EF 35-40% by echo March 2014   Colon cancer metastasized to liver   Acute on chronic combined systolic and diastolic congestive heart failure   Chronic pulmonary embolism   DIABETES MELLITUS, CONTROLLED, WITHOUT COMPLICATIONS   CAD, Ant MI 1990- total LAD with collaterals, Myoview low risk 3/12   Acute kidney injury   HYPERLIPIDEMIA   HYPERTENSION   SUBJECTIVE  Breathing improved.  No chest pain.  Didn't sleep well last night - no specific reason.  Demadex resumed yesterday and he is -1.8. Despite this, his weight is up to 213 lbs (weighed in bed).  BUN/Creat up to 33/1.64, while CO2 is stable @ 24.  CURRENT MEDS . antiseptic oral rinse  15 mL Mouth Rinse BID  . aspirin  81 mg Oral Daily  . ceFEPime (MAXIPIME) IV  1 g Intravenous Q12H  . feeding supplement (ENSURE COMPLETE)  237 mL Oral TID BM  . gabapentin  300 mg Oral Daily  . pantoprazole  40 mg Oral Daily  . polyvinyl alcohol  1 drop Both Eyes Daily  . Rivaroxaban  20 mg Oral Q supper  . simvastatin  20 mg Oral q1800  . sodium chloride  3 mL Intravenous Q12H  . torsemide  20 mg Oral Daily  . vancomycin  1,500 mg Intravenous Q24H    OBJECTIVE  Filed Vitals:   09/03/13 0500 09/03/13 1426 09/03/13 2100 09/04/13 0525  BP: 144/55 122/68 160/76 148/56  Pulse: 89 70 89 80  Temp: 98.5 F (36.9 C) 98.3 F (36.8 C) 100.1 F (37.8 C) 98.5 F (36.9 C)  TempSrc: Oral Oral Oral Oral  Resp: 20 20 20 20   Height:      Weight: 209 lb 10.5 oz (95.1 kg)   213 lb 6.5 oz (96.8 kg)  SpO2: 91% 95% 94% 95%    Intake/Output Summary (Last 24 hours) at 09/04/13 0811 Last data filed at 09/04/13 0530  Gross per 24 hour  Intake    740 ml  Output   1300 ml  Net   -560 ml   Filed Weights   09/02/13 1530 09/03/13 0500 09/04/13 0525  Weight: 205 lb 7.5 oz (93.2 kg) 209 lb  10.5 oz (95.1 kg) 213 lb 6.5 oz (96.8 kg)    PHYSICAL EXAM  General: Pleasant, NAD. Neuro: Alert and oriented X 3. Moves all extremities spontaneously. Psych: Normal affect. HEENT:  Normal  Neck: Supple without bruits.  Neck veins relatively flat. Lungs:  Resp regular and unlabored, scatt rhonchi, diminished left base. Heart: RRR no s3, s4, 2/6 syst m @ lsb. Abdomen: Soft, non-tender, non-distended, BS + x 4. Colostomy. Extremities: No clubbing, cyanosis.  1+ bilat LE edema. DP/PT/Radials 2+ and equal bilaterally.  Accessory Clinical Findings  CBC  Recent Labs  09/02/13 1215 09/03/13 0810  WBC 6.1 5.9  HGB 9.0* 9.4*  HCT 27.6* 27.7*  MCV 92.3 89.6  PLT 213 109   Basic Metabolic Panel  Recent Labs  09/03/13 0810 09/04/13 0508  NA 135* 138  K 3.9 3.8  CL 97 98  CO2 24 24  GLUCOSE 118* 117*  BUN 32* 33*  CREATININE 1.55* 1.64*  CALCIUM 8.4 8.4   TELE  Rsr, pvc's.  Radiology/Studies  Dg Chest 2 View  09/02/2013   CLINICAL DATA:  Cough and shortness of breath  EXAM: CHEST  2 VIEW  COMPARISON:  August 31, 2013  FINDINGS: There is focal infiltrate in the left and right bases, somewhat more consolidated on the left than on the right. Elsewhere lungs are clear. Heart is borderline enlarged with normal pulmonary vascularity. Central catheter tip is in the superior vena cava near the cavoatrial junction. No pneumothorax. No adenopathy. No bone lesions.  IMPRESSION: Patchy bibasilar infiltrate, slightly more on the left than on the right.   Electronically Signed   By: Lowella Grip M.D.   On: 09/02/2013 12:05   ASSESSMENT AND PLAN  1.  HCAP:  abx per IM.  2.  Acute on chronic combined systolic/diastolic CHF/ICM:  Baseline dry weight @ home is 196-198 lbs.  Upon admission, he was recorded @ 200 in the ER but within 4 hrs, weight was recorded @ 205.  He was up to 209 yesterday and now is up another 4 lbs to 213 this AM (despite being minus 1500 yesterday) .  Pt says  that he does not feel volume up.  He has mild LEE, which he says is about his baseline.  His neck veins are flat.  No crackles on exam.  Abd is soft.  I suspect weights are not accurate.  Have requested that he be weighed standing this morning.  Oral demadex was resumed yesterday and BUN/Creat have risen further to 33/1.64.  CO2 stable @ 24.  Hold demadex this AM.  If weight is actually this significantly up, we will need to transition to IV diuretics and likely will ask CHF team to see given cardiorenal component.  Resume BB (he does not appear to be low output).  Cont to hold ACEI in setting of acute kidney injury.  3.  CAD:  No chest pain.  Cont asa, statin.  Resume home dose of bb.  4.  Acute Kidney Injury:  Creat up to 1.64.  Cont to hold acei.  See above.  5.  HTN:  BP trending up.  Resume bb.  Cont to hold ACEI.  6.  H/O PE: 03/2013.  On xarelto.  7.  Normocytic/normochromic Anemia:  Relatively stable.   Signed, Murray Hodgkins NP   Agree with note by Ignacia Bayley NP  I/O -1.8 liters. Breathing better. Scr remains elevated. Pt is dry on exam. Lungs clear, Cor RRR, no periph edema. I suspect that the weights aren't accurate and don't reflect volume status. Agree with holding diuretic and continue to Rx CAP.  Lorretta Harp, M.D., [place], [facility], [place] [address] [place], [place]  [id number]  [phone] 09/04/2013 12:28 PM

## 2013-09-04 NOTE — Discharge Summary (Signed)
[facility] Discharge Summary  Patient name: Todd [name] Medical record number: [id number] Date of birth: 1929-10-15 Age: 80 y.o. Gender: male Date of Admission: 09/02/2013  Date of Discharge: 09/05/13 Admitting Physician: Zigmund Gottron, MD  Primary Care Provider: Willeen Niece, MD Consultants: Cardiology, Oncology (informally via phone, Dr. Alen Blew)   Indication for Hospitalization: Shortness of breath, Fever  Discharge Diagnoses/Problem List:  Presumed Bibasilar HCAP, in setting of recent IV chemotherapy - Improved Chronic combined CHF, secondary to h/o ischemic cardiomyopathy - Stable AKI, likely secondary to pre-renal etiology in setting of CKD III - Improved Colon Cancer (Stage IV), with mets to liver Chronic hypercoagulability, secondary to malignancy H/o CAD HTN HLD H/o arthritis  Disposition: Home  Discharge Condition: Stable  Discharge Exam: General: chronically ill, 20 yr M, sitting in chair at bedside, pleasant and conversational, NAD  Cardiovascular: RRR, 2/6 systolic murmur loudest over L-sternal border / apex  Respiratory: Mostly CTAB with exception of improved bibasilar crackles and rhonchi R>L. Good air movement with normal work of breathing, no tachypnea or respiratory distress  Abdomen: soft, NTND, +active BS, ostomy bag in place  Extremities: mild pitting bilateral LE edema up mid shin (appears mostly stable from previous), non-tender  Skin: warm, dry, no rashes, large seborrheic dermatosis L-scalp Neuro: awake, alert, oriented  Brief Hospital Course: Todd [name] is a 80 y.o. male who presented with worsening shortness of breath, cough, and fever (101.62F), found to have bibasilar infiltrates consistent with HCAP (in setting of recent chemotherapy). PMH is significant for Metastatic Colon Cancer (Stage IV) s/p sigmoid resection with palliative chemotherapy, H/o hypercoagulability provoked PE (03/2013), CAD s/p MI, combined CHF,  HTN, HLD, OA, RA   # Presumed Bibasilar HCAP, in setting of recent IV Chemotherapy - Improved  Presentation clinically consistent with PNA (cough, fever 101.7, focal crackles/rhonchi findings bibasilar), CXR with worsening bibasilar L>R patchy infiltrates (compared to prior 08/31/13 ED visit). Considered HCAP given recent IV chemotherapy, started on Vanc / Cefepime IV for 48 hours, remained afebrile and improved, transitioned to Levaquin PO 750mg  q 48 hr dosing (total 14 day course abx, last day 09/15/13). Otherwise ID work-up unremarkable, blood cultures x2 (NGTD), sputum culture (pending), influenza (neg), HIV (non-reactive), Urine antigens (neg strep pneumo, neg legionella). Significantly improved resp status without O2 req prior to discharge.  # Combined CHF, secondary to h/o ischemic cardiomyopathy - without acute exacerbation, Stable  Last ECHO (10/17/12) with reduced EF 35-40% d/t severe hypokinesis, Grade 2 Diastolic dysfxn, mild-mod AR, mod-severe MR. On presentation, volume status difficult to determine with some evidence (inc wt, LE edema) that he may be at inc volume vs considered dry or euvolemic (dry MM, mostly clear lungs, no JVD). Initial work-up with elevated Pro-BNP 9281 (vs 3523, 08/31/13 ED), but elevated Cr to 1.50 / BUN 31 (likely pre-renal AKI). Consulted Cardiology (established pt with Dr. Gwenlyn Found) for assistance with diuresis. Held all diuretics (including home Torsemide) and IVF, monitored weights 205-213 lb (obtained standing wt, as bed wt deemed inaccurate), UOP remained stable, Cr gradually improved, and volume status did not acutely worsen. Per Cardiology, recommendations on discharge were resume Torsemide 20mg  daily, hold Benazepril, hold Hydrodiuril, continue Metoprolol-XL.  # AKI, likely secondary to pre-renal etiology in setting of CKD (Stage III) - Improved  Baseline Cr about 1.00. On admission, elevated Cr 1.50 with BUN 33, ratio BUN:Cr >20 suspect pre-renal etiology. Held  nephrotoxic agents (Benazepril, Diclofenac). Gradual decreasing Cr tre-->1.55-->1.64-->1.57, Renal US (negative for hydro). Cautious with IVF hydration given  elevated weight and ProBNP, despite no acute CHF exacerbation, overall difficult to treat AKI and encouraged inc PO.  # Colon Cancer (Stage IV), with mets to liver  S/p sigmoid resection, currently followed for palliative chemotherapy by Dr. Alen Blew. Recently has been receiving chemotherapy q 2 weeks, with chemo break during January 2015. Missed chemotherapy session starting 09/04/13, Dr. Alen Blew notified, and agreed with current plan, did not believe kidney injury related to cancer / chemo, no recommendation for further CT surveillance. Plan to coordinate follow-up chemo with patient after discharge. Additionally, patient reported loose mucus discharge per rectum recently (inc sensation of BM when urinating), new concern since colectomy about 1 year ago. Recommended follow-up with Dr. Alphonsa Overall [facility]).  # Chronic hypercoagulability, secondary to malignancy  H/o provoked PE (03/2013), currently on anticoagulation with Xarelto 20mg  daily.  # H/o CAD  S/p MI with LAD stent placement in 1990. No c/o active CP. EKG with NSR with PVCs, no acute ST-T wave changes.  Continued ASA 81mg  daily.  # HTN  Variable BP range mostly stable SBP 100-150s. Initially held most BP meds including Doxazosin, HCTZ, and Imdur. Continued BB. Resumed BP meds on discharge, except held Hydroduril and Benazepril.  # HLD  Continued simvastatin 20mg  daily  # H/o arthritis  Continued Gabapentin 300mg  daily. Held Diclofenac PO given AKI, continue to hold on discharge.  Issues for Follow Up:  1. CHF / Volume status - Suspected dry wt ~ 195-198. Discharge wt 209-213. Continued on Torsemide 20mg  daily 2. Kidney Function - Last Cr 09/05/13 - 1.57, recommend repeat BMET to trend Cr, currently holding Benazepril, Hydroduril, and Diclofenac PO. 3. HCAP -  Monitor for continued improvement, last dose Levaquin PO 09/14/13, (14 day total course abx). 4. Gen Surg f/u - Contacted Dr. Lucia Gaskins for f/u re: mucus per rectum (1 yr post-op colectomy) 5. Chemo - Dr. Alen Blew to coordinate f/u chemo when pt is ready 6. HH PT  Significant Procedures: none  Significant Labs and Imaging:   Recent Labs Lab 08/31/13 1757 09/02/13 1215 09/03/13 0810  WBC 5.6 6.1 5.9  HGB 9.6* 9.0* 9.4*  HCT 29.9* 27.6* 27.7*  PLT 229 213 193    Recent Labs Lab 08/31/13 1757 09/02/13 1215 09/03/13 0810 09/04/13 0508 09/05/13 0518  NA 137 138 135* 138 138  K 4.0 3.6* 3.9 3.8 3.7  CL 98 97 97 98 101  CO2 25 25 24 24 23   GLUCOSE 169* 161* 118* 117* 111*  BUN 34* 31* 32* 33* 30*  CREATININE 1.50* 1.50* 1.55* 1.64* 1.57*  CALCIUM 8.9 8.8 8.4 8.4 8.3*   Lactic Acid - 1.05  ProBNP 9281 (last 3523 on 2/6)  Coags: INR 2.52 (on 2/6) PT 26.3 (on 2/6)  HIV (non-reactive)  Legionella ur ag (negative)  Strep Pneumo ur ag (negative)  Blood Culture x 2 - 2/8 (currently NGTD x 4/5 days)  Sputum Culture - 2/10  Normal flora (final)  Imaging/Diagnostic Tests:  2/8 EKG  NSR with PVCs, no acute ST-T wave changes  2/8 CXR 2v  IMPRESSION:  Patchy bibasilar infiltrate, slightly more on the left than on the  Right.  2/11 Renal US  IMPRESSION:  1. No hydronephrosis.  2. Increased renal parenchymal echogenicity consistent with medical  renal disease.  3. Small right renal cyst. 10 cm left lower pole complex renal cyst  and 3.8 cm midpole simple left renal cyst. The cysts are stable when  compared to the prior CT.  Results/Tests Pending at Time  of Discharge: 1. Blood cultures x 2 (collected 2/8) - NGTD 4/5 days  Discharge Medications:    Medication List    STOP taking these medications       benazepril 10 MG tablet  Commonly known as:  LOTENSIN     diclofenac 75 MG EC tablet  Commonly known as:  VOLTAREN     hydrochlorothiazide 25 MG tablet  Commonly  known as:  HYDRODIURIL      TAKE these medications       aspirin EC 81 MG tablet  Take 81 mg by mouth daily.     chlorhexidine 0.12 % solution  Commonly known as:  PERIDEX  Use as directed 15 mLs in the mouth or throat 2 (two) times daily as needed (for mouth irritation).     diclofenac sodium 1 % Gel  Commonly known as:  VOLTAREN  Apply 2 g topically 3 (three) times daily as needed (for pain).     diphenoxylate-atropine 2.5-0.025 MG per tablet  Commonly known as:  LOMOTIL  Take 2 tablets by mouth 4 (four) times daily as needed for diarrhea or loose stools.     doxazosin 8 MG tablet  Commonly known as:  CARDURA  Take 4 mg by mouth 2 (two) times daily.     ENSURE PO  Take 1 Can by mouth daily.     gabapentin 300 MG capsule  Commonly known as:  NEURONTIN  Take 300 mg by mouth daily.     isosorbide mononitrate 60 MG 24 hr tablet  Commonly known as:  IMDUR  Take 60 mg by mouth daily.     levofloxacin 750 MG tablet  Commonly known as:  LEVAQUIN  Take 1 tablet (750 mg total) by mouth every other day.     lidocaine-prilocaine cream  Commonly known as:  EMLA  Apply 1 application topically as needed (to port).     metoprolol succinate 50 MG 24 hr tablet  Commonly known as:  TOPROL-XL  Take 50 mg by mouth daily. Take with or immediately following a meal.     multivitamin tablet  Take 1 tablet by mouth daily.     mupirocin cream 2 %  Commonly known as:  BACTROBAN  Apply 1 application topically 2 (two) times daily as needed (for skin irritation around stoma).     nitroGLYCERIN 0.4 MG SL tablet  Commonly known as:  NITROSTAT  Place 0.4 mg under the tongue every 5 (five) minutes as needed for chest pain.     simvastatin 40 MG tablet  Commonly known as:  ZOCOR  Take 20 mg by mouth every evening.     sodium chloride 0.65 % Soln nasal spray  Commonly known as:  OCEAN  Place 1 spray into both nostrils as needed for congestion.     SYSTANE OP  Place 1 drop into both  eyes daily.     torsemide 20 MG tablet  Commonly known as:  DEMADEX  Take 30 mg by mouth daily.     vitamin E 400 UNIT capsule  Take 400 Units by mouth daily.     XARELTO 20 MG Tabs tablet  Generic drug:  Rivaroxaban  Take 20 mg by mouth daily with breakfast.        Discharge Instructions: Please refer to Patient Instructions section of EMR for full details.  Patient was counseled important signs and symptoms that should prompt return to medical care, changes in medications, dietary instructions, activity restrictions, and follow up appointments.  Follow-Up Appointments: Follow-up Information   Follow up with Willeen Niece, MD On 09/07/2013. (at 10:45am with Dr. Lindell Noe)    Specialty:  Family Medicine   Contact information:   [place] [address] [phone]       Schedule an appointment as soon as possible for a visit with [facility], MD. (As needed)    Specialty:  General Surgery   Contact information:   [place] [address] [phone]       Schedule an appointment as soon as possible for a visit with [facility], MD. (As needed for continued chemotherapy)    Specialty:  Oncology   Contact information:   [place] [address] [phone]       Follow up with HAGER, BRYAN, PA-C. (The office scheduler will call you with the appt date and time.)    Specialty:  Physician Assistant   Contact information:   [address] [place] [address] [phone]       Follow up with Labs On 09/11/2013. (Have ordered labs drawn this date.)       Nobie Putnam, DO 09/06/2013, 8:50 PM PGY-1, [facility]

## 2013-09-05 ENCOUNTER — Inpatient Hospital Stay (HOSPITAL_COMMUNITY): Payer: Medicare Other

## 2013-09-05 ENCOUNTER — Other Ambulatory Visit: Payer: Self-pay | Admitting: Physician Assistant

## 2013-09-05 DIAGNOSIS — R011 Cardiac murmur, unspecified: Secondary | ICD-10-CM

## 2013-09-05 DIAGNOSIS — N179 Acute kidney failure, unspecified: Secondary | ICD-10-CM

## 2013-09-05 DIAGNOSIS — I5043 Acute on chronic combined systolic (congestive) and diastolic (congestive) heart failure: Secondary | ICD-10-CM

## 2013-09-05 DIAGNOSIS — R0602 Shortness of breath: Secondary | ICD-10-CM

## 2013-09-05 LAB — BASIC METABOLIC PANEL
BUN: 30 mg/dL — ABNORMAL HIGH (ref 6–23)
CO2: 23 meq/L (ref 19–32)
Calcium: 8.3 mg/dL — ABNORMAL LOW (ref 8.4–10.5)
Chloride: 101 mEq/L (ref 96–112)
Creatinine, Ser: 1.57 mg/dL — ABNORMAL HIGH (ref 0.50–1.35)
GFR calc Af Amer: 46 mL/min — ABNORMAL LOW (ref >90)
GFR calc non Af Amer: 40 mL/min — ABNORMAL LOW (ref >90)
GLUCOSE: 111 mg/dL — AB (ref 70–99)
Potassium: 3.7 mEq/L (ref 3.7–5.3)
SODIUM: 138 meq/L (ref 137–147)

## 2013-09-05 MED ORDER — LEVOFLOXACIN 750 MG PO TABS: 750.0000 mg | ORAL_TABLET | ORAL | Status: AC

## 2013-09-05 NOTE — Progress Notes (Signed)
Patient discharge teaching given, including activity, diet, follow-up appoints, and medications. Patient verbalized understanding of all discharge instructions. IV access was d/c'd. Vitals are stable. Skin is intact except as charted in most recent assessments. Pt to be escorted out by RN, to be driven home by family.  

## 2013-09-05 NOTE — Progress Notes (Signed)
Family Medicine Teaching Service Attending Note  I interviewed and examined patient Todd [name] and reviewed their tests and x-rays.  I discussed with Dr. Raliegh Ip and reviewed their note for today.  I agree with their assessment and plan.     Additionally  Feeling much better Ok to discharge with close follow up for pneumonia resolution and creat check

## 2013-09-05 NOTE — Progress Notes (Signed)
Family Medicine Teaching Service Daily Progress Note Intern Pager: [phone]  Patient name: Todd [name] Medical record number: [id number] Date of birth: 09/01/1929 Age: 80 y.o. Gender: male  Primary Care Provider: Willeen Niece, MD Consultants: Cardiology, Oncology (informally via phone, Dr. Alen Blew) Code Status: DNR (confirmed on admission)  Pt Overview and Major Events to Date:  Assessment and Plan: Todd [name] is a 80 y.o. male presenting with worsening shortness of breath, cough, and fever (101.66F), found to have bibasilar infiltrates consistent with HCAP (in setting of recent chemotherapy). PMH is significant for Metastatic Colon Cancer (Stage IV) s/p sigmoid resection with palliative chemotherapy, H/o hypercoagulability provoked PE (03/2013), CAD s/p MI, combined CHF, HTN, HLD, OA, RA   # Presumed Bibasilar HCAP, in setting of recent IV Chemotherapy - Improved Presentation clinically consistent with PNA (cough, fever 101.7, focal crackles/rhonchi findings bibasilar), CXR with worsening bibasilar L>R patchy infiltrates (compared to prior 08/31/13). Considered HCAP given recent IV chemotherapy.  - telemetry, closely monitor VS, O2 saturation (on 2L O2 o/n) - Remains clinically improved, VSS - continue antibiotics for HCAP coverage (s/p CTX and Azithro x 1 dose):             - DC'd Vancomycin / Cefepime, per pharm (2/8>>2/10)        - Continue Levaquin PO 750mg  q 48 hr (2/10>>next dose 2/12), total 14 day course of abx (last day 09/15/13) - f/u blood cultures x 2 (currently NGTD) - f/u sputum cultures (abdunant PMN, few GPC pairs - pending) - influenza panel (negative)  - Check urine antigens - strep pneumo (negative), legionella (negative) - HIV (non-reactive)    # Combined CHF, secondary to h/o ischemic cardiomyopathy - Stable Last ECHO (10/17/12) with reduced EF 35-40% d/t severe hypokinesis, Grade 2 Diastolic dysfxn, mild-mod AR, mod-severe MR  - closely monitor fluid status  - daily  wt / strict I/Os, increased wt 205>>213>>213?, UOP ~ 1L < 24 hrs (total -2.5 L) - re-weigh standing scale at 209 - Hold diuretics, home torsemide 20mg  daily, until his renal function improves, then plan diuresis - c/s Cardiology re: assistance with diuresis in established patient - greatly appreciate recommendations      - Followed by Dr. Gwenlyn Found, Cardiology outpatient      - Continue to hold Torsemide 20mg  daily, clinically not volume overloaded, monitor Cr, wt, fluid status      - continue BB, hold ACEi. If weight persistently increasing, consider IV diuretics  # H/o CAD  S/p MI with stent placement in 1990 (documented as LAD)  - Continue ASA 81mg  daily  - EKG, NSR with PVCs, no acute ST-T wave changes  # Colon Cancer (Stage IV), with mets to liver  S/p sigmoid resection, currently followed for palliative chemotherapy by Dr. Alen Blew. Recently has been receiving chemotherapy q 2 weeks, with chemo break during January 2015. - Due for chemotherapy on 09/04/13, notified Dr. Hazeline Junker office regarding patient's current hospitalized status - contacted Dr. Alen Blew re: worsening kidney function, to determine if warranted for imaging surveillance vs renal US     - Discussed case (2/10), Dr. Alen Blew does not believe decreased renal function is related to his cancer or chemotherapy. He does not recommend pursuing any CT surveillance at this time. If needed, he agrees with plan to get Renal US to eval for possible hydronephrosis. If no etiology discovered and continued worsening, we could consider Renal consult while inpatient     - Postponed current chemotherapy plans, will contact patient to arrange resuming therapy on  discharge. No further need to schedule or make arrangements prior to discharge - Reported occasional sensation for potential BM (with some minimal leakage of mucus from rectum), occurs when patient urinates, just recently started. 1 yr post-op  # Chronic hypercoagulability, secondary to  malignancy  H/o provoked PE (03/2013), currently on anticoagulation with Xarelto  - continue home Xarelto 20mg  daily, CrCl >30 (calculated at 44)  # AKI, likely secondary to pre-renal etiology in setting of CKD (Stage III) - Improved Baseline Cr about 1.00 - elevated Cr 1.50-->1.55-->1.64-->1.57. BUN 33>>30, with BUN:Cr >19 suspect pre-renal dehydration etiology - f/u daily BMET, Cr trend  - ordered Renal US (2/10) to eval for hydronephrosis, if negative consider c/s Renal - SLIV  - Hold nephrotoxic agents, Benazepril 10mg  daily, NSAIDs  # HTN  - BP 140-150s / 70-80s today - Continue homeToprol-XL 50mg  daily - Hold Doxazosin 4mg  BID, HCTZ 25mg  daily, Imdur 60mg  24 hr,  # HLD  - Continue simvastatin 20mg  daily  # H/o arthritis  - Continue Gabapentin 300mg  daily  - Hold diclofenac given AKI  FEN/GI: SLIV, Heart Diet, Protonix 40mg  PO daily  Prophylaxis: No VTE prophylaxis - therapeutic anticoagulation with Xarelto  Disposition: Admitted to [facility], inpatient status for HCAP, pending complete diagnostic work-up and improving clinical course, with improving renal function, and clinically improved PNA, expect patient may be discharged to home today vs tomorrow (pending results of Renal US and further recommendations)  Subjective: No acute events overnight. Reports that he feels good, and overall remains much improved from when he came to the hospital. Denies any fever/chills, HA, CP, or SOB. Only complaint is about a new sensation within the past few days that feels like he has to "have a bowel movement" when he urinates and some scant mucus discharge per rectum. Note colectomy performed 1 year ago. Otherwise, tolerating diet and ambulation well. Some decreased UOP in past 24 hrs, but has decreased his own fluid intake.  Objective: Temp:  [99.4 F (37.4 C)-100.1 F (37.8 C)] 99.4 F (37.4 C) (02/11 0530) Pulse Rate:  [76-93] 93 (02/11 0530) Resp:  [18-20] 20 (02/11 0530) BP:  (145-152)/(67-84) 149/70 mmHg (02/11 0530) SpO2:  [88 %-98 %] 91 % (02/11 0530) Weight:  [213 lb 6.4 oz (96.798 kg)] 213 lb 6.4 oz (96.798 kg) (02/11 0530) Physical Exam: General: chronically ill, 89 yr M, sitting up at bedside, pleasant and conversational, NAD  HEENT: patent nares w/o congestion, MMM Cardiovascular: RRR, 2/6 systolic murmur loudest over L-sternal border / apex  Respiratory: Mostly CTAB with exception of improved bibasilar crackles and rhonchi R>L. Good air movement with normal work of breathing, no tachypnea or respiratory distress  Abdomen: soft, NTND, +active BS, ostomy bag in place Extremities: mild pitting bilateral LE edema up mid shin (appears mostly stable from previous), non-tender Skin: warm, dry, no rashes, large seborrheic dermatosis L-forehead  Neuro: awake, alert, oriented  Laboratory:  Recent Labs Lab 08/31/13 1757 09/02/13 1215 09/03/13 0810  WBC 5.6 6.1 5.9  HGB 9.6* 9.0* 9.4*  HCT 29.9* 27.6* 27.7*  PLT 229 213 193    Recent Labs Lab 09/03/13 0810 09/04/13 0508 09/05/13 0518  NA 135* 138 138  K 3.9 3.8 3.7  CL 97 98 101  CO2 24 24 23   BUN 32* 33* 30*  CREATININE 1.55* 1.64* 1.57*  CALCIUM 8.4 8.4 8.3*  GLUCOSE 118* 117* 111*   Lactic Acid - 1.05  ProBNP 9281 (last 3523 on 2/6)  Coags: INR 2.52 (on 2/6) PT 26.3 (  on 2/6) HIV (non-reactive) Legionella ur ag (negative) Strep Pneumo ur ag (negative)  Blood Culture x 2 - 2/8 (currently NGTD)  Sputum Culture - 2/10 abdunant PMN, few GPC pairs - pending  Imaging/Diagnostic Tests:  2/8 EKG  NSR with PVCs, no acute ST-T wave changes  2/8 CXR 2v  IMPRESSION:  Patchy bibasilar infiltrate, slightly more on the left than on the  Right.  2/11 Renal US Pending results  Nobie Putnam, DO 09/05/2013, 9:41 AM PGY-1, [place] Intern pager: [phone], text pages welcome

## 2013-09-05 NOTE — Discharge Instructions (Signed)
It was a pleasure taking care of you in the hospital.  - We confirmed that your presenting respiratory symptoms were due to a Pneumonia, and it has appropriately responded to antibiotic therapy, which has now been transitioned to oral Levaquin.  - Your volume status was difficult to accurately determine, but both the Cardiology Team and our team did not believe that you were in an acute CHF exacerbation, and more likely were initially dehydrated on admission, causing your the concern with your kidneys (Acute Kidney Injury). Your kidney function has gradually shown signs that it is improving.  - For your recent episodes of rectal leakage, we discussed this and believe that arranging follow-up within the next few weeks with General Surgery (Dr. Lucia Gaskins) would be the best option at this point.  Medication Changes on discharge (please discuss these further with Dr. Lindell Noe in the clinic, as he may decide to restart or change some of these): - STOP Benazepril - STOP Voltaren - STOP Hydrodiuril (Hydrochlorothiazide)  - START Levaquin 750mg  - take 1 tablet every other day, (start 09/06/13 - last dose 09/14/13)  Please pay close attention to the follow-up information listed to arrange appropriate plans to follow-up with: Family Medicine, Cardiology, Oncology, and General Surgery  If you develop worsening / return of symptoms with respiratory distress / shortness of breath, chest pain, fever / chills, altered mental status / confusion, significantly reduced urine output, or increased edema, then please call our clinic to be seen urgently, or seek appropriate medical attention in the Emergency Department as needed.   Pneumonia, Adult Pneumonia is an infection of the lungs.  CAUSES Pneumonia may be caused by bacteria or a virus. Usually, these infections are caused by breathing infectious particles into the lungs (respiratory tract). SYMPTOMS   Cough.  Fever.  Chest pain.  Increased rate of  breathing.  Wheezing.  Mucus production. DIAGNOSIS  If you have the common symptoms of pneumonia, your caregiver will typically confirm the diagnosis with a chest X-ray. The X-ray will show an abnormality in the lung (pulmonary infiltrate) if you have pneumonia. Other tests of your blood, urine, or sputum may be done to find the specific cause of your pneumonia. Your caregiver may also do tests (blood gases or pulse oximetry) to see how well your lungs are working. TREATMENT  Some forms of pneumonia may be spread to other people when you cough or sneeze. You may be asked to wear a mask before and during your exam. Pneumonia that is caused by bacteria is treated with antibiotic medicine. Pneumonia that is caused by the influenza virus may be treated with an antiviral medicine. Most other viral infections must run their course. These infections will not respond to antibiotics.  PREVENTION A pneumococcal shot (vaccine) is available to prevent a common bacterial cause of pneumonia. This is usually suggested for:  People over 51 years old.  Patients on chemotherapy.  People with chronic lung problems, such as bronchitis or emphysema.  People with immune system problems. If you are over 65 or have a high risk condition, you may receive the pneumococcal vaccine if you have not received it before. In some countries, a routine influenza vaccine is also recommended. This vaccine can help prevent some cases of pneumonia.You may be offered the influenza vaccine as part of your care. If you smoke, it is time to quit. You may receive instructions on how to stop smoking. Your caregiver can provide medicines and counseling to help you quit. HOME CARE INSTRUCTIONS  Cough suppressants may be used if you are losing too much rest. However, coughing protects you by clearing your lungs. You should avoid using cough suppressants if you can.  Your caregiver may have prescribed medicine if he or she thinks your  pneumonia is caused by a bacteria or influenza. Finish your medicine even if you start to feel better.  Your caregiver may also prescribe an expectorant. This loosens the mucus to be coughed up.  Only take over-the-counter or prescription medicines for pain, discomfort, or fever as directed by your caregiver.  Do not smoke. Smoking is a common cause of bronchitis and can contribute to pneumonia. If you are a smoker and continue to smoke, your cough may last several weeks after your pneumonia has cleared.  A cold steam vaporizer or humidifier in your room or home may help loosen mucus.  Coughing is often worse at night. Sleeping in a semi-upright position in a recliner or using a couple pillows under your head will help with this.  Get rest as you feel it is needed. Your body will usually let you know when you need to rest. SEEK IMMEDIATE MEDICAL CARE IF:   Your illness becomes worse. This is especially true if you are elderly or weakened from any other disease.  You cannot control your cough with suppressants and are losing sleep.  You begin coughing up blood.  You develop pain which is getting worse or is uncontrolled with medicines.  You have a fever.  Any of the symptoms which initially brought you in for treatment are getting worse rather than better.  You develop shortness of breath or chest pain. MAKE SURE YOU:   Understand these instructions.  Will watch your condition.  Will get help right away if you are not doing well or get worse. Document Released: 07/12/2005 Document Revised: 10/04/2011 Document Reviewed: 10/01/2010 [facility] Patient Information 2014 [place], [place].

## 2013-09-05 NOTE — Progress Notes (Signed)
Subjective:  No CP/SOB. Feeling much better  Objective:  Temp:  [99.4 F (37.4 C)-100.1 F (37.8 C)] 99.4 F (37.4 C) (02/11 0530) Pulse Rate:  [66-93] 66 (02/11 1037) Resp:  [18-20] 20 (02/11 0530) BP: (129-152)/(70-84) 129/77 mmHg (02/11 1037) SpO2:  [88 %-95 %] 91 % (02/11 0530) Weight:  [213 lb 6.4 oz (96.798 kg)] 213 lb 6.4 oz (96.798 kg) (02/11 0530) Weight change: -3 lb 14.5 oz (-1.771 kg)  Intake/Output from previous day: 02/10 0701 - 02/11 0700 In: 600 [P.O.:597; I.V.:3] Out: 1625 [Urine:1625]  Intake/Output from this shift: Total I/O In: 3 [I.V.:3] Out: 300 [Urine:300]  Physical Exam: General appearance: alert and no distress Neck: no adenopathy, no carotid bruit, no JVD, supple, symmetrical, trachea midline and thyroid not enlarged, symmetric, no tenderness/mass/nodules Lungs: clear to auscultation bilaterally Heart: 2/6 apical SEM c/w MR Extremities: 1+ edema  Lab Results: Results for orders placed during the hospital encounter of 09/02/13 (from the past 48 hour(s))  CULTURE, EXPECTORATED SPUTUM-ASSESSMENT     Status: None   Collection Time    09/04/13  1:04 AM      Result Value Ref Range   Specimen Description SPUTUM     Special Requests NONE     Sputum evaluation       Value: THIS SPECIMEN IS ACCEPTABLE. RESPIRATORY CULTURE REPORT TO FOLLOW.   Report Status 09/04/2013 FINAL    CULTURE, RESPIRATORY (NON-EXPECTORATED)     Status: None   Collection Time    09/04/13  1:04 AM      Result Value Ref Range   Specimen Description SPUTUM     Special Requests NONE     Gram Stain       Value: ABUNDANT WBC PRESENT, PREDOMINANTLY PMN     RARE SQUAMOUS EPITHELIAL CELLS PRESENT     FEW GRAM POSITIVE COCCI     IN PAIRS     Performed at [facility]   Culture       Value: NORMAL OROPHARYNGEAL FLORA     Performed at [facility]   Report Status PENDING    BASIC METABOLIC PANEL     Status: Abnormal   Collection Time    09/04/13  5:08 AM        Result Value Ref Range   Sodium 138  137 - 147 mEq/L   Potassium 3.8  3.7 - 5.3 mEq/L   Chloride 98  96 - 112 mEq/L   CO2 24  19 - 32 mEq/L   Glucose, Bld 117 (*) 70 - 99 mg/dL   BUN 33 (*) 6 - 23 mg/dL   Creatinine, Ser 1.64 (*) 0.50 - 1.35 mg/dL   Calcium 8.4  8.4 - 10.5 mg/dL   GFR calc non Af Amer 38 (*) >90 mL/min   GFR calc Af Amer 44 (*) >90 mL/min   Comment: (NOTE)     The eGFR has been calculated using the CKD EPI equation.     This calculation has not been validated in all clinical situations.     eGFR's persistently <90 mL/min signify possible Chronic Kidney     Disease.  BASIC METABOLIC PANEL     Status: Abnormal   Collection Time    09/05/13  5:18 AM      Result Value Ref Range   Sodium 138  137 - 147 mEq/L   Potassium 3.7  3.7 - 5.3 mEq/L   Chloride 101  96 - 112 mEq/L   CO2 23  19 - 32 mEq/L   Glucose, Bld 111 (*) 70 - 99 mg/dL   BUN 30 (*) 6 - 23 mg/dL   Creatinine, Ser 1.57 (*) 0.50 - 1.35 mg/dL   Calcium 8.3 (*) 8.4 - 10.5 mg/dL   GFR calc non Af Amer 40 (*) >90 mL/min   GFR calc Af Amer 46 (*) >90 mL/min   Comment: (NOTE)     The eGFR has been calculated using the CKD EPI equation.     This calculation has not been validated in all clinical situations.     eGFR's persistently <90 mL/min signify possible Chronic Kidney     Disease.    Imaging: Imaging results have been reviewed  Assessment/Plan:   1. Principal Problem: 2.   HCAP (healthcare-associated pneumonia) 3. Active Problems: 4.   DIABETES MELLITUS, CONTROLLED, WITHOUT COMPLICATIONS 5.   HYPERLIPIDEMIA 6.   HYPERTENSION 7.   Ischemic cardiomyopathy, EF 35-40% by echo March 2014 8.   CAD, Ant MI 1990- total LAD with collaterals, Myoview low risk 3/12 9.   Colon cancer metastasized to liver 10.   Acute on chronic combined systolic and diastolic congestive heart failure 11.   Chronic pulmonary embolism 12.   Acute kidney injury 13.   Time Spent Directly with Patient:  20  minutes  Length of Stay:  LOS: 3 days   Pt feeling improved. Treated for CAP. I/O neg. SCr stabilized at 1.5 (above baseline). He was on demadex and hydodiuril at home. Would D/C thiazide and continue demadex. OK for D/C home with close OP F/U and labs.  Lorretta Harp, M.D., [place], [facility], [place] [address] [place], [place]  [id number]  [phone] 09/05/2013 1:18 PM  Quay Burow J 09/05/2013, 1:16 PM

## 2013-09-05 NOTE — Progress Notes (Signed)
Physical Therapy Treatment Patient Details Name: Todd [name] MRN: [id number] DOB: 1929-11-01 Today's Date: 09/05/2013 Time: 7628-3151 PT Time Calculation (min): 14 min  PT Assessment / Plan / Recommendation  History of Present Illness Todd [name] is a 80 y.o. male presenting with worsening shortness of breath, cough, and fever (101.56F), found to have bibasilar infiltrates consistent with HCAP (in setting of recent chemotherapy). PMH is significant for Metastatic Colon Cancer (Stage IV) s/p sigmoid resection with palliative chemotherapy, H/o hypercoagulability provoked PE (03/2013), CAD s/p MI, combined CHF, HTN, HLD, OA, RA     PT Comments   Pt progressing towards goals, ambulated today without AD, pt with decreased safety with this and educated to use RW at home for safety. PT will continue to follow and recommend HHPT for balance program and strengthening.   Follow Up Recommendations  Home health PT;Supervision/Assistance - 24 hour     Does the patient have the potential to tolerate intense rehabilitation     Barriers to Discharge        Equipment Recommendations  None recommended by PT    Recommendations for Other Services    Frequency Min 3X/week   Progress towards PT Goals Progress towards PT goals: Progressing toward goals  Plan Current plan remains appropriate    Precautions / Restrictions Precautions Precautions: Fall Restrictions Weight Bearing Restrictions: No Other Position/Activity Restrictions: colostomy   Pertinent Vitals/Pain Dyspnea 2/4 with ambulation without AD, educated on energy conservation benefits of RW    Mobility  Bed Mobility Overal bed mobility: Modified Independent Transfers Overall transfer level: Modified independent Equipment used: Rolling walker (2 wheeled) Transfers: Sit to/from Stand Sit to Stand: Modified independent (Device/Increase time) Ambulation/Gait Ambulation/Gait assistance: Modified independent (Device/Increase  time) Ambulation Distance (Feet): 250 Feet Assistive device: None Gait Pattern/deviations: Step-through pattern;Shuffle;Wide base of support Gait velocity interpretation: Below normal speed for age/gender General Gait Details: practiced ambulation without RW today as pt reports that he does not always use it at home. He relied on rail along wall 3-4x for mild LOB, self-correction on stable surface. As distance increased, pt step height decreased and BOS widened. Pt also c/o sciatica RLE which caused gait deviation. Dyspnea 2/4 with increased distance as well. Given all of this, education given on need for RW and that safest practice would be to use it consistently even within the home. Pt verbalized understanding.     Exercises     PT Diagnosis:    PT Problem List:   PT Treatment Interventions:     PT Goals (current goals can now be found in the care plan section) Acute Rehab PT Goals Patient Stated Goal: return home PT Goal Formulation: With patient Time For Goal Achievement: 09/17/13 Potential to Achieve Goals: Good  Visit Information  Last PT Received On: 09/05/13 Assistance Needed: +1 History of Present Illness: Todd [name] is a 80 y.o. male presenting with worsening shortness of breath, cough, and fever (101.56F), found to have bibasilar infiltrates consistent with HCAP (in setting of recent chemotherapy). PMH is significant for Metastatic Colon Cancer (Stage IV) s/p sigmoid resection with palliative chemotherapy, H/o hypercoagulability provoked PE (03/2013), CAD s/p MI, combined CHF, HTN, HLD, OA, RA      Subjective Data  Subjective: pt admits that at home he rarely uses RW Patient Stated Goal: return home   Cognition  Cognition Arousal/Alertness: Awake/alert Behavior During Therapy: WFL for tasks assessed/performed Overall Cognitive Status: Within Functional Limits for tasks assessed    Balance  Balance Overall balance assessment: Needs assistance  Sitting-balance support:  No upper extremity supported;Feet supported Sitting balance-Leahy Scale: Good Standing balance support: No upper extremity supported;During functional activity Standing balance-Leahy Scale: Fair Standing balance comment: pt can stand without support but cannot accept challenges and when begins to move, places hand on stable surface  End of Session PT - End of Session Equipment Utilized During Treatment: Gait belt Activity Tolerance: Patient tolerated treatment well Patient left: in chair;with call bell/phone within reach;with family/visitor present Nurse Communication: Mobility status   GP   Leighton Roach, [place]  [place], [place] 09/05/2013, 4:05 PM

## 2013-09-06 ENCOUNTER — Telehealth (INDEPENDENT_AMBULATORY_CARE_PROVIDER_SITE_OTHER): Payer: Self-pay

## 2013-09-06 LAB — CULTURE, RESPIRATORY W GRAM STAIN

## 2013-09-06 LAB — CULTURE, RESPIRATORY: Culture: NORMAL

## 2013-09-06 NOTE — Telephone Encounter (Signed)
F/U call ; Patient states he was seen  in the ED 08/31/13 for Pneumonia , loose slime stools , He was advised to follow up with Dr. Lucia Gaskins. He has appt with Dr. Lindell Noe 09-07-13. I advised him I would pass his ED. Notes ,abd. CT and renal U/S  to Dr. Lucia Gaskins and I would call him with his recommendation. Patient verbalized understanding

## 2013-09-07 ENCOUNTER — Inpatient Hospital Stay: Payer: Medicare Other | Admitting: Family Medicine

## 2013-09-07 ENCOUNTER — Ambulatory Visit (INDEPENDENT_AMBULATORY_CARE_PROVIDER_SITE_OTHER): Payer: Medicare Other | Admitting: Family Medicine

## 2013-09-07 ENCOUNTER — Encounter: Payer: Self-pay | Admitting: Family Medicine

## 2013-09-07 ENCOUNTER — Encounter: Payer: Self-pay | Admitting: *Deleted

## 2013-09-07 VITALS — BP 122/70 | HR 60 | Ht 69.0 in | Wt 210.0 lb

## 2013-09-07 DIAGNOSIS — I509 Heart failure, unspecified: Secondary | ICD-10-CM

## 2013-09-07 DIAGNOSIS — M069 Rheumatoid arthritis, unspecified: Secondary | ICD-10-CM

## 2013-09-07 DIAGNOSIS — N179 Acute kidney failure, unspecified: Secondary | ICD-10-CM

## 2013-09-07 DIAGNOSIS — J189 Pneumonia, unspecified organism: Secondary | ICD-10-CM

## 2013-09-07 DIAGNOSIS — I2510 Atherosclerotic heart disease of native coronary artery without angina pectoris: Secondary | ICD-10-CM

## 2013-09-07 DIAGNOSIS — I5043 Acute on chronic combined systolic (congestive) and diastolic (congestive) heart failure: Secondary | ICD-10-CM

## 2013-09-07 LAB — BASIC METABOLIC PANEL
BUN: 33 mg/dL — ABNORMAL HIGH (ref 6–23)
CO2: 27 meq/L (ref 19–32)
Calcium: 8.6 mg/dL (ref 8.4–10.5)
Chloride: 98 mEq/L (ref 96–112)
Creat: 1.9 mg/dL — ABNORMAL HIGH (ref 0.50–1.35)
GLUCOSE: 116 mg/dL — AB (ref 70–99)
POTASSIUM: 3.7 meq/L (ref 3.5–5.3)
Sodium: 133 mEq/L — ABNORMAL LOW (ref 135–145)

## 2013-09-07 MED ORDER — METOPROLOL SUCCINATE ER 50 MG PO TB24: 50.0000 mg | ORAL_TABLET | Freq: Every day | ORAL | Status: DC

## 2013-09-07 MED ORDER — TORSEMIDE 20 MG PO TABS: ORAL_TABLET | ORAL | Status: DC

## 2013-09-07 MED ORDER — BENZONATATE 100 MG PO CAPS: 100.0000 mg | ORAL_CAPSULE | Freq: Two times a day (BID) | ORAL | Status: DC | PRN

## 2013-09-07 NOTE — Progress Notes (Signed)
   Subjective:    Patient ID: Todd [name], male    DOB: April 09, 1930, 80 y.o.   MRN: [id number]  HPI Patient here with his wife [name] for hospital followup; still on Levaquin for HCAP, for which he was hospitalized earlier this week.  Tolerating well.  Cough continues to keep him awake at night and is worse at night. Delsym not helping much.  Strenuous coughing produces some blood-tinged sputum.  No epistaxis since disharge (on Xarelto and this has been a continuous problem).    Regarding heart failure, is taking torsemide 20mg  daily; no longer on HCTZ.  For renal function check today, as his serum Cr was elevated in hospital (baseline had been around 1; went up to 1.5 during hospital stay).  Is eating and drinking well. No longer takes Voltaren.   Has continued to have small amount of stool incontinence which had started in hospital. Stool cultures negative from hospitalization (reviewed today).    ROS No fevers or chills; is tolerating food but with limited appetite.  Cough as above.   Review of Systems     Objective:   Physical Exam Alert, generally well appearing, no apparent distress HEENT neck supple, no cervical adenopathy.  COR regular S1S2 PULM Scattered wheezes initially, clear with forceful cough.  LEs: 2+ bilateral pitting edema. Compression hose on.        Assessment & Plan:

## 2013-09-07 NOTE — Assessment & Plan Note (Signed)
Continue with torsemide 20mg  daily; refills given today. Checking weights at home, continues to be up above his ideal dry weight of 200 lbs. Checking Bmet today.  Follow up with Cardiology on Feb 20th.

## 2013-09-07 NOTE — Assessment & Plan Note (Signed)
Baseline Cr 1.0; up to 1.5 on discharge from hospital.  Recheck today;.had negative renal US for hydronephrosis. Consider renal consult if continues high on this recheck.

## 2013-09-07 NOTE — Assessment & Plan Note (Signed)
Improving overall. Cough continues to be a problem; trial of benzonatate and guaifenesin.  Complete course of levaquin.

## 2013-09-07 NOTE — Patient Instructions (Signed)
It was a pleasure to see you today.  We are checking your kidney function with a lab today.   Please finish out the Levaquin as directed on discharge.   For your cough, you may take Mucinex 600mg  one tablet every 12 hours (thins mucus).  Benzonatate perles 100mg  every 12 hours (cough suppressant).  I would like to see you back in 2 to 3 weeks (after your cardiology and oncology appointments).  Printed refills given for the torsemide, metoprolol.

## 2013-09-08 LAB — CULTURE, BLOOD (ROUTINE X 2)
CULTURE: NO GROWTH
Culture: NO GROWTH

## 2013-09-10 ENCOUNTER — Telehealth: Payer: Self-pay | Admitting: Family Medicine

## 2013-09-10 ENCOUNTER — Other Ambulatory Visit: Payer: Medicare Other

## 2013-09-10 ENCOUNTER — Telehealth: Payer: Self-pay | Admitting: Medical Oncology

## 2013-09-10 DIAGNOSIS — N179 Acute kidney failure, unspecified: Secondary | ICD-10-CM

## 2013-09-10 NOTE — Telephone Encounter (Signed)
Patient called reporting needing to talk about "everything" states received call from Dr Lindell Noe this morning reporting to him that his kidney functions are "worse now, then when in hospital." Patient states he "isn't ready for chemo yet." States he continues to cough up blood and right now "everything is up in the air." Informed patient of his appt with Dr Alen Blew 02/24, patient states he's not sure if he'll be "keeping that appt."   When talking with Patient, he sounds overwhelmed and confused at this point in time as how to proceed. Informed patient will give this mssg to Dr. Alen Blew.

## 2013-09-10 NOTE — Telephone Encounter (Signed)
Called pt. Informed. .Nik Gorrell  

## 2013-09-10 NOTE — Telephone Encounter (Signed)
RED TEAM: I am out of the office today. Please let Todd [name] know that his kidney function is slightly worse than when he left the hospital (Creatinine 1.9 now; was 1.5 at discharge).  I am making a consult for Nephrology and I would like him to have another BMet done this Thurs or Fri in our office if he has not been able to see them this week (or if not done at the Oncologist's office at the [facility]).  If he has bloodwork at the [facility], then he does not need to have the BMet here later this week. Thank you, JB

## 2013-09-12 ENCOUNTER — Encounter: Payer: Self-pay | Admitting: Cardiology

## 2013-09-12 ENCOUNTER — Ambulatory Visit
Admission: RE | Admit: 2013-09-12 | Discharge: 2013-09-12 | Disposition: A | Discharge status: Home / Self Care | Source: Ambulatory Visit | Attending: Family Medicine | Admitting: Family Medicine

## 2013-09-12 ENCOUNTER — Telehealth: Payer: Self-pay | Admitting: Cardiovascular Disease

## 2013-09-12 ENCOUNTER — Telehealth: Payer: Self-pay | Admitting: Family Medicine

## 2013-09-12 ENCOUNTER — Ambulatory Visit (INDEPENDENT_AMBULATORY_CARE_PROVIDER_SITE_OTHER): Payer: Medicare Other | Admitting: Cardiology

## 2013-09-12 VITALS — BP 140/70 | HR 71 | Ht 69.0 in | Wt 211.0 lb

## 2013-09-12 DIAGNOSIS — R042 Hemoptysis: Secondary | ICD-10-CM

## 2013-09-12 DIAGNOSIS — I5043 Acute on chronic combined systolic (congestive) and diastolic (congestive) heart failure: Secondary | ICD-10-CM

## 2013-09-12 DIAGNOSIS — J189 Pneumonia, unspecified organism: Secondary | ICD-10-CM

## 2013-09-12 DIAGNOSIS — I2510 Atherosclerotic heart disease of native coronary artery without angina pectoris: Secondary | ICD-10-CM

## 2013-09-12 DIAGNOSIS — I2699 Other pulmonary embolism without acute cor pulmonale: Secondary | ICD-10-CM

## 2013-09-12 DIAGNOSIS — D649 Anemia, unspecified: Secondary | ICD-10-CM

## 2013-09-12 DIAGNOSIS — I509 Heart failure, unspecified: Secondary | ICD-10-CM

## 2013-09-12 DIAGNOSIS — N179 Acute kidney failure, unspecified: Secondary | ICD-10-CM

## 2013-09-12 NOTE — Telephone Encounter (Signed)
Called home number to let him know I had seen his CXR done today; left voice message.  JB

## 2013-09-12 NOTE — Assessment & Plan Note (Signed)
Not yet completed antibiotics

## 2013-09-12 NOTE — Telephone Encounter (Signed)
Will fwd to Dr. Lindell Noe.  Pinchas Reither, [place]oral[place], [place]

## 2013-09-12 NOTE — Assessment & Plan Note (Signed)
Pt in HF today, weight is up, significant episode of DOE this AM.  He also has hemoptysis, on Xarelto for PE, for last few days, some bright blood some pink some dark.   CXR today with bil.small Pleural effusions.  Take torsemide 20 mg this pm then 40 mg tomorrow and Friday.  His Cr is elevated but he will have labs on Friday.  I added a CBC as well with the hemoptysis.  He will follow up on Monday, he did not think he could come on Friday.  He and his wife know to call or go to hospital for worsening symptoms.

## 2013-09-12 NOTE — Telephone Encounter (Signed)
I spoke with patient today.  He has not been scheduled with the [facility] yet, has requested this for medication review.  I will forward to [employer] Team, Admin Team and Pharmacy Team for assistance in how to schedule this office visit. JB

## 2013-09-12 NOTE — Telephone Encounter (Signed)
Called [name] back and gave verbal orders for nursing care for [name] per Dr Lindell Noe.Lamone Ferrelli, Kevin Fenton

## 2013-09-12 NOTE — Telephone Encounter (Signed)
ashanti needs verbal orders for nursing. The original show PT but pt has refused and wants nursing care Please advise

## 2013-09-12 NOTE — Assessment & Plan Note (Signed)
No chest pain

## 2013-09-12 NOTE — Patient Instructions (Signed)
Take an extra 20 mg torsemide today.  Tomorrow and Friday take torsemide 40 mg in the AM.  If you are not improving with increased dose of torsemide call us.    Have lab work on Friday I am including a CBC.  If the bloody sputum does not improve call our office.  Follow up on Monday.

## 2013-09-12 NOTE — Telephone Encounter (Signed)
Returned call and pt verified x 2.  Pt c/o SOB and coughing up blood for a few days.  Stated he is on Xarelto.  Pt also c/o feet swelling and R hand swelling.  Pt denied CP/pressure.  Pt advised to see PCP r/t coughing up blood.  Informed Xarelto increased bleeding, but does not cause bleeding.  Pt verbalized understanding.  Pt was scheduled to be seen this Friday for hospital f/u and appt rescheduled for today at 4:20pm w/ Cecilie Kicks, NP for evaluation.  Pt again reminded to f/u with PCP r/t coughing up blood.  Pt verbalized understanding and agreed w/ plan.  Of note, pt w/o any audible labored breathing during call and able to hold conversation w/o audible SOB.

## 2013-09-12 NOTE — Assessment & Plan Note (Signed)
See above

## 2013-09-12 NOTE — Telephone Encounter (Signed)
Pt wants to talk to Dr Lindell Noe.  He has been coughing up blood for several days and has trouble breathing at times. He wants to talk to breen rather than come in

## 2013-09-12 NOTE — Telephone Encounter (Signed)
Returned patient's call; his cough is improved with the Tessalon, but still with bloody sputum.  No fevers.  Has some dyspnea but not severe, not noticeably worse than previous.  Does not want to be hospitalized.  Is on Xarelto for PE last Sept (2014), being treated for metastatic colon cancer.  Plan to order 2 view CXR to be done at [place] this afternoon on his way to cardiology appt.   Also reiterated plan for Nephrology appt as outpatient in light of rising Cr and no evidence of post-renal cause.  He has not increased his outpatient diuretic.  Referral already placed for Nephrology appointment. [name], MD

## 2013-09-12 NOTE — Progress Notes (Signed)
09/12/2013   PCP: Willeen Niece, MD   Chief Complaint  Patient presents with  . Follow-up    edema bilaterally    Primary Cardiologist: Dr. Gwenlyn Found  HPI:  81 year old, mildly overweight, married Caucasian male, father of 2, grandfather to 5 grandchildren without is formerly a patient of Dr. Aldona Bar. Dr. Gwenlyn Found assumed his care in Dr. Eddie Dibbles absence. He has a history of ischemic heart disease status post anterior wall myocardial infarction in 1990, at which time he underwent cardiac catheterization revealing a total LAD with collaterals. His EF at that time was 30%. He has had no evidence of congestive heart failure. His other problems include hypertension and hyperlipidemia as well as gout. He was recently diagnosed with colon cancer and underwent resection by Dr. Alphonsa Overall with a colostomy. He has undergone chemotherapy administered by Dr. Alen Blew with reduced dose FOLFIRI with Avastin. A 2-D echo performed 09/2012 revealed an EF in the 35% range with wall motion abnormality consistent with his LAD occlusion. He had grade 2 diastolic dysfunction. His PA pk pressure was 51 mmHg.  Hx 04/05/2013 for Lt pulmonary emboli.Marland Kitchen His dry weight is considered 200-205 pounds.   Recent hospitalization for HCAP.  Continues on antibiotics.  Now with increasing weight and lower ext edema.  Today he developed sudden acute DOE and had to stop and rest, no chest pain.  He also has elevated Cr. His ACE is on hold and his HCTZ was stopped.  Today he had a CXR with minimal bil pl effusions and cardiomegaly.  He also complains of hemoptysis since his discharge.  Some streaked mucus and some  bright blood.  He is on Xarelto.  Weight is up to 211 though he has on winter clothes, his dry weight is most likely lower after recent PNA.      No Known Allergies  Current Outpatient Prescriptions  Medication Sig Dispense Refill  . aspirin EC 81 MG tablet Take 81 mg by mouth daily.      . benzonatate  (TESSALON) 100 MG capsule Take 1 capsule (100 mg total) by mouth 2 (two) times daily as needed for cough.  20 capsule  0  . chlorhexidine (PERIDEX) 0.12 % solution Use as directed 15 mLs in the mouth or throat 2 (two) times daily as needed (for mouth irritation).      Marland Kitchen diclofenac sodium (VOLTAREN) 1 % GEL Apply 2 g topically 3 (three) times daily as needed (for pain).      Marland Kitchen diphenoxylate-atropine (LOMOTIL) 2.5-0.025 MG per tablet Take 2 tablets by mouth 4 (four) times daily as needed for diarrhea or loose stools.      . doxazosin (CARDURA) 8 MG tablet Take 4 mg by mouth 2 (two) times daily.      Marland Kitchen gabapentin (NEURONTIN) 300 MG capsule Take 300 mg by mouth daily.      . isosorbide mononitrate (IMDUR) 60 MG 24 hr tablet Take 60 mg by mouth daily.      Marland Kitchen levofloxacin (LEVAQUIN) 750 MG tablet Take 1 tablet (750 mg total) by mouth every other day.  5 tablet  0  . lidocaine-prilocaine (EMLA) cream Apply 1 application topically as needed (to port).      . metoprolol succinate (TOPROL-XL) 50 MG 24 hr tablet Take 1 tablet (50 mg total) by mouth daily. Take with or immediately following a meal.  90 tablet  4  . Multiple Vitamin (MULTIVITAMIN) tablet Take 1 tablet by mouth  daily.        . mupirocin cream (BACTROBAN) 2 % Apply 1 application topically 2 (two) times daily as needed (for skin irritation around stoma).      . nitroGLYCERIN (NITROSTAT) 0.4 MG SL tablet Place 0.4 mg under the tongue every 5 (five) minutes as needed for chest pain.      . Nutritional Supplements (ENSURE PO) Take 1 Can by mouth daily.      [name] Glycol-Propyl Glycol (SYSTANE OP) Place 1 drop into both eyes daily.       . Rivaroxaban (XARELTO) 20 MG TABS tablet Take 20 mg by mouth daily with breakfast.       . simvastatin (ZOCOR) 40 MG tablet Take 20 mg by mouth every evening.      . sodium chloride (OCEAN) 0.65 % SOLN nasal spray Place 1 spray into both nostrils as needed for congestion.      . torsemide (DEMADEX) 20 MG tablet  Take 20 to 40mg  by mouth one time daily as directed by physician.  180 tablet  3  . vitamin E 400 UNIT capsule Take 400 Units by mouth daily.       No current facility-administered medications for this visit.    Past Medical History  Diagnosis Date  . Myocardial infarction 1990    "Massive"- total LAD with collaterals  . Gout   . Hypertension   . Arthritis, rheumatoid   . Diabetes mellitus without complication   . CHF (congestive heart failure)   . BPH (benign prostatic hyperplasia)   . Neuropathy   . Hyperlipidemia   . Ischemic cardiomyopathy March 2012    EF 31% Myoview. Pt declines ICD  . Colon cancer 2014    Surg, chemo March 2014    Past Surgical History  Procedure Laterality Date  . Cataract extraction    . Flexible sigmoidoscopy N/A 09/05/2012    Procedure: FLEXIBLE SIGMOIDOSCOPY;  Surgeon: Missy Sabins, MD;  Location: [facility];  Service: Endoscopy;  Laterality: N/A;  Give 1000 cc tapwater enema on call to endoscopy.  . Colon resection N/A 09/07/2012    Procedure: LAPAROSCOPIC ASSISTED SIGMOID COLON RESECTION END OSTOMY;  Surgeon: Shann Medal, MD;  Location: [facility];  Service: General;  Laterality: N/A;  . Portacath placement N/A 09/07/2012    Procedure: Placement of power port;  Surgeon: Shann Medal, MD;  Location: [facility];  Service: General;  Laterality: N/A;    NUU:VOZDGUY:[id number] from HCAP, no weight changes, continues with cough Skin:no rashes or ulcers HEENT:no blurred vision, no congestion CV:see HPI PUL:see HPI GI:no diarrhea constipation or melena, no indigestion GU:no hematuria, no dysuria MS:+ joint pain, no claudication Neuro:no syncope, no lightheadedness Endo:no diabetes, no thyroid disease  PHYSICAL EXAM BP 140/70  Pulse 71  Ht 5\' 9"  (1.753 m)  Wt 211 lb (95.709 kg)  BMI 31.15 kg/m2 General:Pleasant affect, NAD Skin:Warm and dry, brisk capillary refill HEENT:normocephalic, sclera clear, mucus membranes moist Neck:supple, tr JVD, no  bruits  Heart:S1S2 RRR without murmur, gallup, rub or click Lungs: with few rales bibasilar, no rhonchi, or wheezes GLO:VFIE, non tender, + BS, do not palpate liver spleen or masses, colostomy bag intact Ext:2+  lower ext edema Rt>.Lt, , 2+ radial pulses Neuro:alert and oriented, MAE, follows commands, + facial symmetry  EKG:SR  With rare PVC,no acute changes  ASSESSMENT AND PLAN Acute on chronic combined systolic and diastolic congestive heart failure Pt in HF today, weight is up, significant episode of DOE this AM.  He also has hemoptysis, on Xarelto for PE, for last few days, some bright blood some pink some dark.   CXR today with bil.small Pleural effusions.  Take torsemide 20 mg this pm then 40 mg tomorrow and Friday.  His Cr is elevated but he will have labs on Friday.  I added a CBC as well with the hemoptysis.  He will follow up on Monday, he did not think he could come on Friday.  He and his wife know to call or go to hospital for worsening symptoms.    CAD, Ant MI 1990- total LAD with collaterals, Myoview low risk 3/12 No chest pain  Acute pulmonary embolism- 04/02/13 Continue Xarelto  HCAP (healthcare-associated pneumonia) Not yet completed antibiotics  Hemoptysis See above   If HF not improved may need repeat echo.

## 2013-09-12 NOTE — Telephone Encounter (Signed)
Spoke with patient.  No fever, patient states cough medication is working some, but when he coughs, he coughs up blood.  His sputum is thick and dark red.  The SOB is making him tired.  Pt sees Dr. Alvester Chou this afternoon, but states that he (Dr. Alvester Chou) doesn't seem to want to address the cough and blood.  I will forward this message to Dr. Lindell Noe for advice.  Todd [name], Loralyn [place], [place]

## 2013-09-12 NOTE — Assessment & Plan Note (Signed)
Continue Xarelto 

## 2013-09-12 NOTE — Telephone Encounter (Signed)
Been coughing up blood for the last 3 days,does not feel well at all. He wanted you to know he is on Xarelto.

## 2013-09-13 NOTE — Addendum Note (Signed)
Addended byChauncy Lean. on: 09/13/2013 02:52 PM   Modules accepted: Orders

## 2013-09-14 ENCOUNTER — Ambulatory Visit: Payer: Medicare Other | Admitting: Physician Assistant

## 2013-09-14 ENCOUNTER — Other Ambulatory Visit (INDEPENDENT_AMBULATORY_CARE_PROVIDER_SITE_OTHER): Payer: Medicare Other

## 2013-09-14 DIAGNOSIS — N179 Acute kidney failure, unspecified: Secondary | ICD-10-CM

## 2013-09-14 DIAGNOSIS — R319 Hematuria, unspecified: Secondary | ICD-10-CM

## 2013-09-14 LAB — BASIC METABOLIC PANEL
BUN: 38 mg/dL — AB (ref 6–23)
CALCIUM: 8.6 mg/dL (ref 8.4–10.5)
CO2: 29 mEq/L (ref 19–32)
CREATININE: 2.2 mg/dL — AB (ref 0.50–1.35)
Chloride: 101 mEq/L (ref 96–112)
Glucose, Bld: 92 mg/dL (ref 70–99)
Potassium: 4 mEq/L (ref 3.5–5.3)
Sodium: 138 mEq/L (ref 135–145)

## 2013-09-14 LAB — POCT UA - MICROSCOPIC ONLY

## 2013-09-14 LAB — CBC
HCT: 26 % — ABNORMAL LOW (ref 39.0–52.0)
Hemoglobin: 8.7 g/dL — ABNORMAL LOW (ref 13.0–17.0)
MCH: 28.6 pg (ref 26.0–34.0)
MCHC: 33.5 g/dL (ref 30.0–36.0)
MCV: 85.5 fL (ref 78.0–100.0)
Platelets: 290 10*3/uL (ref 150–400)
RBC: 3.04 MIL/uL — ABNORMAL LOW (ref 4.22–5.81)
RDW: 17 % — AB (ref 11.5–15.5)
WBC: 7.4 10*3/uL (ref 4.0–10.5)

## 2013-09-14 LAB — POCT URINALYSIS DIPSTICK
Bilirubin, UA: NEGATIVE
GLUCOSE UA: NEGATIVE
Ketones, UA: NEGATIVE
LEUKOCYTES UA: NEGATIVE
NITRITE UA: NEGATIVE
PROTEIN UA: 30
Spec Grav, UA: 1.015
UROBILINOGEN UA: 0.2
pH, UA: 7

## 2013-09-14 NOTE — Progress Notes (Signed)
BMP AND UA DONE TODAY Carin Shipp 

## 2013-09-14 NOTE — Progress Notes (Signed)
Added urine culture per Dr. Mingo Amber.   Unk Lightning, MLS

## 2013-09-15 LAB — URINE CULTURE
Colony Count: NO GROWTH
Organism ID, Bacteria: NO GROWTH

## 2013-09-17 ENCOUNTER — Telehealth: Payer: Self-pay | Admitting: *Deleted

## 2013-09-17 ENCOUNTER — Other Ambulatory Visit: Payer: Self-pay | Admitting: *Deleted

## 2013-09-17 ENCOUNTER — Encounter: Payer: Self-pay | Admitting: *Deleted

## 2013-09-17 MED ORDER — BENZONATATE 100 MG PO CAPS: 100.0000 mg | ORAL_CAPSULE | Freq: Two times a day (BID) | ORAL | Status: DC | PRN

## 2013-09-17 NOTE — Telephone Encounter (Signed)
Patient calling to say he was just d/c'd from the hospital. Would like to cancel his appt for lab and dr visit 09/18/13.

## 2013-09-18 ENCOUNTER — Telehealth: Payer: Self-pay | Admitting: *Deleted

## 2013-09-18 ENCOUNTER — Other Ambulatory Visit: Payer: Medicare Other

## 2013-09-18 ENCOUNTER — Telehealth: Payer: Self-pay | Admitting: Family Medicine

## 2013-09-18 ENCOUNTER — Ambulatory Visit: Payer: Medicare Other | Admitting: Oncology

## 2013-09-18 ENCOUNTER — Inpatient Hospital Stay: Payer: Medicare Other

## 2013-09-18 NOTE — Telephone Encounter (Signed)
Patient calls requesting results from recent lab testings. Also, patient has run out of cough meds and would like to know if Dr. Lindell Noe still wants him on it. Please advise.

## 2013-09-18 NOTE — Telephone Encounter (Signed)
Will fwd to MD.  Nazeer Romney L, CMA  

## 2013-09-18 NOTE — Telephone Encounter (Signed)
Left message on patient identified answering machine, okay not to come for visit, so soon after being d/c'd from the hospital, per dr Alen Blew. Call back to re-schedule when he is fully recovered.

## 2013-09-19 ENCOUNTER — Ambulatory Visit (INDEPENDENT_AMBULATORY_CARE_PROVIDER_SITE_OTHER): Payer: Medicare Other | Admitting: Physician Assistant

## 2013-09-19 ENCOUNTER — Telehealth: Payer: Self-pay | Admitting: *Deleted

## 2013-09-19 ENCOUNTER — Encounter: Payer: Self-pay | Admitting: Physician Assistant

## 2013-09-19 ENCOUNTER — Ambulatory Visit: Payer: Medicare Other | Admitting: Physician Assistant

## 2013-09-19 ENCOUNTER — Telehealth: Payer: Self-pay | Admitting: Cardiology

## 2013-09-19 ENCOUNTER — Other Ambulatory Visit: Payer: Self-pay | Admitting: Physician Assistant

## 2013-09-19 VITALS — BP 126/60 | HR 68 | Ht 69.0 in | Wt 214.0 lb

## 2013-09-19 DIAGNOSIS — R0609 Other forms of dyspnea: Secondary | ICD-10-CM

## 2013-09-19 DIAGNOSIS — I10 Essential (primary) hypertension: Secondary | ICD-10-CM

## 2013-09-19 DIAGNOSIS — R0600 Dyspnea, unspecified: Secondary | ICD-10-CM

## 2013-09-19 DIAGNOSIS — R042 Hemoptysis: Secondary | ICD-10-CM

## 2013-09-19 DIAGNOSIS — D649 Anemia, unspecified: Secondary | ICD-10-CM

## 2013-09-19 DIAGNOSIS — I509 Heart failure, unspecified: Secondary | ICD-10-CM

## 2013-09-19 DIAGNOSIS — I2782 Chronic pulmonary embolism: Secondary | ICD-10-CM

## 2013-09-19 DIAGNOSIS — I2589 Other forms of chronic ischemic heart disease: Secondary | ICD-10-CM

## 2013-09-19 DIAGNOSIS — I255 Ischemic cardiomyopathy: Secondary | ICD-10-CM

## 2013-09-19 DIAGNOSIS — R0989 Other specified symptoms and signs involving the circulatory and respiratory systems: Secondary | ICD-10-CM

## 2013-09-19 DIAGNOSIS — I5043 Acute on chronic combined systolic (congestive) and diastolic (congestive) heart failure: Secondary | ICD-10-CM

## 2013-09-19 DIAGNOSIS — I2510 Atherosclerotic heart disease of native coronary artery without angina pectoris: Secondary | ICD-10-CM

## 2013-09-19 DIAGNOSIS — R0902 Hypoxemia: Secondary | ICD-10-CM

## 2013-09-19 DIAGNOSIS — I5023 Acute on chronic systolic (congestive) heart failure: Secondary | ICD-10-CM

## 2013-09-19 LAB — CBC
HCT: 24 % — ABNORMAL LOW (ref 39.0–52.0)
Hemoglobin: 8.1 g/dL — ABNORMAL LOW (ref 13.0–17.0)
MCH: 29.5 pg (ref 26.0–34.0)
MCHC: 33.8 g/dL (ref 30.0–36.0)
MCV: 87.3 fL (ref 78.0–100.0)
PLATELETS: 259 10*3/uL (ref 150–400)
RBC: 2.75 MIL/uL — ABNORMAL LOW (ref 4.22–5.81)
RDW: 17.9 % — ABNORMAL HIGH (ref 11.5–15.5)
WBC: 8.6 10*3/uL (ref 4.0–10.5)

## 2013-09-19 LAB — IRON AND TIBC
%SAT: 15 % — ABNORMAL LOW (ref 20–55)
IRON: 41 ug/dL — AB (ref 42–165)
TIBC: 275 ug/dL (ref 215–435)
UIBC: 234 ug/dL (ref 125–400)

## 2013-09-19 LAB — BASIC METABOLIC PANEL
BUN: 52 mg/dL — ABNORMAL HIGH (ref 6–23)
CALCIUM: 8.7 mg/dL (ref 8.4–10.5)
CO2: 26 mEq/L (ref 19–32)
Chloride: 101 mEq/L (ref 96–112)
Creat: 2.92 mg/dL — ABNORMAL HIGH (ref 0.50–1.35)
Glucose, Bld: 109 mg/dL — ABNORMAL HIGH (ref 70–99)
POTASSIUM: 4.5 meq/L (ref 3.5–5.3)
SODIUM: 136 meq/L (ref 135–145)

## 2013-09-19 MED ORDER — POTASSIUM CHLORIDE ER 10 MEQ PO TBCR: 10.0000 meq | EXTENDED_RELEASE_TABLET | Freq: Every day | ORAL | Status: AC

## 2013-09-19 MED ORDER — TORSEMIDE 20 MG PO TABS: 40.0000 mg | ORAL_TABLET | Freq: Two times a day (BID) | ORAL | Status: DC

## 2013-09-19 NOTE — Telephone Encounter (Signed)
Home Health RN called stating patient's O2 sats drop from 96% resting to 83% with ambulation. Pt notes SOB, 4lb weight gain over last 6 days, despite compliance with diuretic. He also has bilateral LEE. He has tried increasing his diuretic dose (Torsemide ) from 20 mg to 40 mg over the last several days with no improvement. The Home Health RN states that he was recently discharged from the hospital for CHF and also developed hospital acquired PNA. He has completed is course of antibiotics. She notes significant wheezing on lung exam. He continues to cough up brown colored sputum and also slight ? hemoptysis (on oral anticoagulation with Xarelto). Pt is scheduled to see Tarri Fuller, PA-C, tomorrow (2/26) for follow-up, but is concerned about the chance for inclement weather. I instructed the RN to call the office at 10 am to try to reschedule the appointment for today.   Lyda Jester, PA-C [facility]

## 2013-09-19 NOTE — Assessment & Plan Note (Addendum)
The patient's weight continues to increase with considerable LEE and orthopnea.  I have increased his torsemide to 40mg  BID until he returns to baseline.  I will add 50meq of K as well.   He is due to see Dr. Lindell Noe on Friday.    Resting O2 98% on room air ,  Ambulatory O2 sats 88%. With improvement to 92% on 2 L supplemental O2 while ambulating.  Ordered Home O2.

## 2013-09-19 NOTE — Patient Instructions (Signed)
1.  Increase torsemide to 40mg  BID.  Continue with Daily weights.  Decrease torsemide to once daily when weight returns to baseline. 2.  Follow up in two weeks. 3.  Labs today

## 2013-09-19 NOTE — Assessment & Plan Note (Signed)
BP well controlled.

## 2013-09-19 NOTE — Telephone Encounter (Signed)
I tried to reach patient at his home telephone, line was busy both times that I called.  I see that he had a visit at Cardiology this morning, his torsemide has been adjusted and labs drawn.  He has an appointment here on Friday.   I recommend he continue the benzonatate for cough for now.  Regarding his renal function labs that were done on Monday, his renal function continues to worsen.  We have begun the process of Nephrology referral, which I continue to recommend.    Todd Mayotte, MD

## 2013-09-19 NOTE — Assessment & Plan Note (Signed)
Checking Fe panel.  CBC.

## 2013-09-19 NOTE — Assessment & Plan Note (Signed)
The patient is on Xarelto, unfortunately he continues to complain of hemoptysis.  Hgb decreased to 8.7 from 10.4 four weeks ago.  I am rechecking it during this visit.  If he continues to drop I will need to stop the Xarelto and will increase ASA to 325mg .  A recent UA also showed large amount of Hgb.

## 2013-09-19 NOTE — Progress Notes (Addendum)
Date:  09/19/2013   ID:  Todd [name], DOB 1929-09-18, MRN [id number]  PCP:  Willeen Niece, MD  Primary Cardiologist:  Gwenlyn Found    History of Present Illness: Todd [name] is a 80 y.o. male 80 year old, mildly overweight, married Caucasian male, father of 2, grandfather to 5 grandchildren without is formerly a patient of Dr. Aldona Bar. Dr. Gwenlyn Found assumed his care in Dr. Eddie Dibbles absence. He has a history of ischemic heart disease status post anterior wall myocardial infarction in 1990, at which time he underwent cardiac catheterization revealing a total LAD with collaterals. His EF at that time was 30%.  His other problems include hypertension and hyperlipidemia as well as gout. He was recently diagnosed with colon cancer and underwent resection by Dr. Alphonsa Overall with a colostomy. He has undergone chemotherapy administered by Dr. Alen Blew with reduced dose FOLFIRI with Avastin.  A 2-D echo performed 09/2012 revealed an EF in the 35% range with wall motion abnormality consistent with his LAD occlusion. He had grade 2 diastolic dysfunction. His PA pk pressure was 51 mmHg. Hx 04/05/2013 for Lt pulmonary emboli on Xarelto. His dry weight is considered 200-205 pounds.   Recent hospitalization for HCAP 2/8-2/11.   He saw Cecilie Kicks on 2/18 in the office.  Patient notices worsening orthopnea, dyspnea, lower extremity edema with increased weight gain. He also continues to have hemoptysis which was noted during previous visit as well. Recent hemoglobin level was 8.7 and has decreased from 10.4 four weeks ago.  Recent urinalysis also shows large amounts of Hgb.  Home health RN noted decrease in oxygen saturations into the mid 80s with ambulation.  He had decrease in dizziness.    The patient currently denies nausea, vomiting, fever, chest pain,  abdominal pain, hematochezia, melena, claudication.  Wt Readings from Last 3 Encounters:  09/19/13 214 lb (97.07 kg)  09/12/13 211 lb (95.709 kg)  09/07/13 210 lb  (95.255 kg)     Past Medical History  Diagnosis Date  . Myocardial infarction 1990    "Massive"- total LAD with collaterals  . Gout   . Hypertension   . Arthritis, rheumatoid   . Diabetes mellitus without complication   . CHF (congestive heart failure)   . BPH (benign prostatic hyperplasia)   . Neuropathy   . Hyperlipidemia   . Ischemic cardiomyopathy March 2012    EF 31% Myoview. Pt declines ICD  . Colon cancer 2014    Surg, chemo March 2014    Current Outpatient Prescriptions  Medication Sig Dispense Refill  . aspirin EC 81 MG tablet Take 81 mg by mouth daily.      . benzonatate (TESSALON) 100 MG capsule Take 1 capsule (100 mg total) by mouth 2 (two) times daily as needed for cough.  20 capsule  0  . chlorhexidine (PERIDEX) 0.12 % solution Use as directed 15 mLs in the mouth or throat 2 (two) times daily as needed (for mouth irritation).      Marland Kitchen diclofenac sodium (VOLTAREN) 1 % GEL Apply 2 g topically 3 (three) times daily as needed (for pain).      Marland Kitchen diphenoxylate-atropine (LOMOTIL) 2.5-0.025 MG per tablet Take 2 tablets by mouth 4 (four) times daily as needed for diarrhea or loose stools.      . doxazosin (CARDURA) 8 MG tablet Take 4 mg by mouth 2 (two) times daily.      Marland Kitchen gabapentin (NEURONTIN) 300 MG capsule Take 300 mg by mouth daily.      Marland Kitchen  isosorbide mononitrate (IMDUR) 60 MG 24 hr tablet Take 60 mg by mouth daily.      Marland Kitchen lidocaine-prilocaine (EMLA) cream Apply 1 application topically as needed (to port).      . metoprolol succinate (TOPROL-XL) 50 MG 24 hr tablet Take 1 tablet (50 mg total) by mouth daily. Take with or immediately following a meal.  90 tablet  4  . Multiple Vitamin (MULTIVITAMIN) tablet Take 1 tablet by mouth daily.        . mupirocin cream (BACTROBAN) 2 % Apply 1 application topically 2 (two) times daily as needed (for skin irritation around stoma).      . nitroGLYCERIN (NITROSTAT) 0.4 MG SL tablet Place 0.4 mg under the tongue every 5 (five) minutes as  needed for chest pain.      . Nutritional Supplements (ENSURE PO) Take 1 Can by mouth daily.      [name] Glycol-Propyl Glycol (SYSTANE OP) Place 1 drop into both eyes daily.       . Rivaroxaban (XARELTO) 20 MG TABS tablet Take 20 mg by mouth daily with breakfast.       . simvastatin (ZOCOR) 40 MG tablet Take 20 mg by mouth every evening.      . sodium chloride (OCEAN) 0.65 % SOLN nasal spray Place 1 spray into both nostrils as needed for congestion.      . torsemide (DEMADEX) 20 MG tablet Take 2 tablets (40 mg total) by mouth 2 (two) times daily. Take 20 to 40mg  by mouth one time daily as directed by physician.  120 tablet  3  . vitamin E 400 UNIT capsule Take 400 Units by mouth daily.      . potassium chloride (K-DUR) 10 MEQ tablet Take 1 tablet (10 mEq total) by mouth daily.  30 tablet  4   No current facility-administered medications for this visit.    Allergies:   No Known Allergies  Social History:  The patient  reports that he quit smoking about 4 years ago. He has never used smokeless tobacco. He reports that he does not drink alcohol or use illicit drugs.   Family history:   Family History  Problem Relation Age of Onset  . Stroke Father   . Diabetes Father   . Hypertension Father   . Heart attack Father   . Hyperlipidemia Neg Hx   . Sudden death Neg Hx     ROS:  Please see the history of present illness.  All other systems reviewed and negative.   PHYSICAL EXAM: VS:  BP 126/60  Pulse 68  Ht 5\' 9"  (1.753 m)  Wt 214 lb (97.07 kg)  BMI 31.59 kg/m2 Obese, well developed, in no acute distress HEENT: Pupils are equal round react to light accommodation extraocular movements are intact.  Neck: no JVDNo cervical lymphadenopathy. Cardiac: Regular rate and rhythm with 1/6 systolic murmur in axillae.  Does not worsened with bearing down. Lungs:  Mild basilar rhonchi.  But surprisingly no rales Abd: soft, nontender, positive bowel sounds all quadrants Ext: 3+ lower extremity  edema.  2+ radial and dorsalis pedis pulses. Skin: warm and dry Neuro:  Grossly normal   ASSESSMENT AND PLAN:  Problem List Items Addressed This Visit   HYPERTENSION (Chronic)     BP well controlled.    Relevant Medications      torsemide (DEMADEX) tablet   Ischemic cardiomyopathy, EF 35-40% by echo March 2014 (Chronic)   CAD, Ant MI 1990- total LAD with collaterals, Myoview low  risk 3/12 (Chronic)   Dyspnea   Relevant Orders      B Nat Peptide   Acute on chronic combined systolic and diastolic congestive heart failure     The patient's weight continues to increase with considerable LEE and orthopnea.  I have increased his torsemide to 40mg  BID until he returns to baseline.  I will add 12meq of K as well.   He is due to see Dr. Lindell Noe on Friday.    Resting O2 98% on room air ,  Ambulatory O2 sats 88%. With improvement to 92% on 2 L supplemental O2 while ambulating.  Ordered Home O2.    Chronic pulmonary embolism     The patient is on Xarelto, unfortunately he continues to complain of hemoptysis.  Hgb decreased to 8.7 from 10.4 four weeks ago.  I am rechecking it during this visit.  If he continues to drop I will need to stop the Xarelto and will increase ASA to 325mg .  A recent UA also showed large amount of Hgb.       Hemoptysis   Normocytic anemia     Checking Fe panel.  CBC.    Hypoxemia               Other Visit Diagnoses   Hypoxia    -  Primary    Acute on chronic systolic heart failure        Relevant Orders       Basic Metabolic Panel (BMET)       B Nat Peptide       For home use only DME oxygen    Anemia        Relevant Orders       CBC       IBC panel

## 2013-09-19 NOTE — Addendum Note (Signed)
Addended by: Brett Canales on: 09/19/2013 01:44 PM   Modules accepted: Orders

## 2013-09-20 ENCOUNTER — Telehealth: Payer: Self-pay | Admitting: Physician Assistant

## 2013-09-20 ENCOUNTER — Other Ambulatory Visit: Payer: Self-pay | Admitting: Physician Assistant

## 2013-09-20 ENCOUNTER — Ambulatory Visit: Payer: Medicare Other | Admitting: Physician Assistant

## 2013-09-20 DIAGNOSIS — N179 Acute kidney failure, unspecified: Secondary | ICD-10-CM

## 2013-09-20 LAB — BRAIN NATRIURETIC PEPTIDE: Brain Natriuretic Peptide: 778.6 pg/mL — ABNORMAL HIGH (ref 0.0–100.0)

## 2013-09-20 MED ORDER — POLYSACCHARIDE IRON COMPLEX 150 MG PO CAPS: 150.0000 mg | ORAL_CAPSULE | Freq: Every day | ORAL | Status: AC

## 2013-09-20 NOTE — Telephone Encounter (Signed)
I called the patient to discuss lab results.   hgb continues to decrease and is now 8.1.  He said the hemoptysis has decreased.  I am worried about stopping the Xarelto with recent PE.  Scr has increased to 2.92.   I have ordered a referral to nephrology as was Dr. Rosario Jacks thoughts as well.  Iron is low.  Ordered supplementation.   He is clearly volume overloaded.  He may need to be admitted for IV therapy.  Loghan Kurtzman, PA-C 2:49 PM

## 2013-09-21 ENCOUNTER — Other Ambulatory Visit: Payer: Self-pay

## 2013-09-21 ENCOUNTER — Inpatient Hospital Stay (HOSPITAL_COMMUNITY)
Admission: AD | Admit: 2013-09-21 | Discharge: 2013-09-25 | DRG: 291 | Disposition: A | Discharge status: Hospice | Payer: Medicare Other | Source: Ambulatory Visit | Attending: Family Medicine | Admitting: Family Medicine

## 2013-09-21 ENCOUNTER — Encounter: Payer: Self-pay | Admitting: Family Medicine

## 2013-09-21 ENCOUNTER — Telehealth: Payer: Self-pay | Admitting: *Deleted

## 2013-09-21 ENCOUNTER — Ambulatory Visit (INDEPENDENT_AMBULATORY_CARE_PROVIDER_SITE_OTHER): Payer: Medicare Other | Admitting: Family Medicine

## 2013-09-21 ENCOUNTER — Encounter (HOSPITAL_COMMUNITY): Payer: Self-pay | Admitting: Cardiology

## 2013-09-21 ENCOUNTER — Inpatient Hospital Stay (HOSPITAL_COMMUNITY): Payer: Medicare Other

## 2013-09-21 VITALS — BP 137/83 | HR 80 | Ht 69.0 in | Wt 213.0 lb

## 2013-09-21 DIAGNOSIS — N189 Chronic kidney disease, unspecified: Secondary | ICD-10-CM | POA: Diagnosis present

## 2013-09-21 DIAGNOSIS — T394X5A Adverse effect of antirheumatics, not elsewhere classified, initial encounter: Secondary | ICD-10-CM | POA: Diagnosis present

## 2013-09-21 DIAGNOSIS — I2589 Other forms of chronic ischemic heart disease: Secondary | ICD-10-CM | POA: Diagnosis present

## 2013-09-21 DIAGNOSIS — Z79899 Other long term (current) drug therapy: Secondary | ICD-10-CM

## 2013-09-21 DIAGNOSIS — N400 Enlarged prostate without lower urinary tract symptoms: Secondary | ICD-10-CM | POA: Diagnosis present

## 2013-09-21 DIAGNOSIS — I509 Heart failure, unspecified: Secondary | ICD-10-CM | POA: Diagnosis present

## 2013-09-21 DIAGNOSIS — N170 Acute kidney failure with tubular necrosis: Secondary | ICD-10-CM | POA: Diagnosis present

## 2013-09-21 DIAGNOSIS — C787 Secondary malignant neoplasm of liver and intrahepatic bile duct: Secondary | ICD-10-CM | POA: Diagnosis present

## 2013-09-21 DIAGNOSIS — N179 Acute kidney failure, unspecified: Secondary | ICD-10-CM

## 2013-09-21 DIAGNOSIS — D509 Iron deficiency anemia, unspecified: Secondary | ICD-10-CM | POA: Diagnosis present

## 2013-09-21 DIAGNOSIS — I5043 Acute on chronic combined systolic (congestive) and diastolic (congestive) heart failure: Secondary | ICD-10-CM

## 2013-09-21 DIAGNOSIS — Z86711 Personal history of pulmonary embolism: Secondary | ICD-10-CM

## 2013-09-21 DIAGNOSIS — I130 Hypertensive heart and chronic kidney disease with heart failure and stage 1 through stage 4 chronic kidney disease, or unspecified chronic kidney disease: Principal | ICD-10-CM | POA: Diagnosis present

## 2013-09-21 DIAGNOSIS — Z66 Do not resuscitate: Secondary | ICD-10-CM | POA: Diagnosis not present

## 2013-09-21 DIAGNOSIS — C7970 Secondary malignant neoplasm of unspecified adrenal gland: Secondary | ICD-10-CM | POA: Diagnosis present

## 2013-09-21 DIAGNOSIS — M109 Gout, unspecified: Secondary | ICD-10-CM | POA: Diagnosis present

## 2013-09-21 DIAGNOSIS — R042 Hemoptysis: Secondary | ICD-10-CM | POA: Diagnosis present

## 2013-09-21 DIAGNOSIS — I252 Old myocardial infarction: Secondary | ICD-10-CM | POA: Diagnosis present

## 2013-09-21 DIAGNOSIS — Z9221 Personal history of antineoplastic chemotherapy: Secondary | ICD-10-CM

## 2013-09-21 DIAGNOSIS — T448X5A Adverse effect of centrally-acting and adrenergic-neuron-blocking agents, initial encounter: Secondary | ICD-10-CM | POA: Diagnosis present

## 2013-09-21 DIAGNOSIS — R531 Weakness: Secondary | ICD-10-CM

## 2013-09-21 DIAGNOSIS — Z87891 Personal history of nicotine dependence: Secondary | ICD-10-CM

## 2013-09-21 DIAGNOSIS — I059 Rheumatic mitral valve disease, unspecified: Secondary | ICD-10-CM | POA: Diagnosis present

## 2013-09-21 DIAGNOSIS — G589 Mononeuropathy, unspecified: Secondary | ICD-10-CM | POA: Diagnosis present

## 2013-09-21 DIAGNOSIS — Z7901 Long term (current) use of anticoagulants: Secondary | ICD-10-CM

## 2013-09-21 DIAGNOSIS — I340 Nonrheumatic mitral (valve) insufficiency: Secondary | ICD-10-CM | POA: Diagnosis present

## 2013-09-21 DIAGNOSIS — E785 Hyperlipidemia, unspecified: Secondary | ICD-10-CM | POA: Diagnosis present

## 2013-09-21 DIAGNOSIS — I2699 Other pulmonary embolism without acute cor pulmonale: Secondary | ICD-10-CM | POA: Diagnosis present

## 2013-09-21 DIAGNOSIS — Z7982 Long term (current) use of aspirin: Secondary | ICD-10-CM

## 2013-09-21 DIAGNOSIS — K5900 Constipation, unspecified: Secondary | ICD-10-CM | POA: Diagnosis not present

## 2013-09-21 DIAGNOSIS — C189 Malignant neoplasm of colon, unspecified: Secondary | ICD-10-CM | POA: Diagnosis present

## 2013-09-21 DIAGNOSIS — Z85038 Personal history of other malignant neoplasm of large intestine: Secondary | ICD-10-CM

## 2013-09-21 DIAGNOSIS — Z515 Encounter for palliative care: Secondary | ICD-10-CM

## 2013-09-21 DIAGNOSIS — D649 Anemia, unspecified: Secondary | ICD-10-CM

## 2013-09-21 DIAGNOSIS — I2510 Atherosclerotic heart disease of native coronary artery without angina pectoris: Secondary | ICD-10-CM

## 2013-09-21 DIAGNOSIS — Z933 Colostomy status: Secondary | ICD-10-CM

## 2013-09-21 DIAGNOSIS — M069 Rheumatoid arthritis, unspecified: Secondary | ICD-10-CM

## 2013-09-21 DIAGNOSIS — E119 Type 2 diabetes mellitus without complications: Secondary | ICD-10-CM | POA: Diagnosis present

## 2013-09-21 HISTORY — DX: Nonrheumatic mitral (valve) insufficiency: I34.0

## 2013-09-21 LAB — URINALYSIS, ROUTINE W REFLEX MICROSCOPIC
Bilirubin Urine: NEGATIVE
GLUCOSE, UA: NEGATIVE mg/dL
Ketones, ur: NEGATIVE mg/dL
Leukocytes, UA: NEGATIVE
Nitrite: NEGATIVE
PROTEIN: 100 mg/dL — AB
Specific Gravity, Urine: 1.015 (ref 1.005–1.030)
Urobilinogen, UA: 0.2 mg/dL (ref 0.0–1.0)
pH: 5.5 (ref 5.0–8.0)

## 2013-09-21 LAB — CBC
HCT: 23 % — ABNORMAL LOW (ref 39.0–52.0)
HEMOGLOBIN: 7.6 g/dL — AB (ref 13.0–17.0)
MCH: 30.4 pg (ref 26.0–34.0)
MCHC: 33 g/dL (ref 30.0–36.0)
MCV: 92 fL (ref 78.0–100.0)
Platelets: 213 10*3/uL (ref 150–400)
RBC: 2.5 MIL/uL — ABNORMAL LOW (ref 4.22–5.81)
RDW: 18.3 % — ABNORMAL HIGH (ref 11.5–15.5)
WBC: 8.9 10*3/uL (ref 4.0–10.5)

## 2013-09-21 LAB — URINE MICROSCOPIC-ADD ON

## 2013-09-21 LAB — COMPREHENSIVE METABOLIC PANEL
ALK PHOS: 42 U/L (ref 39–117)
ALT: 8 U/L (ref 0–53)
AST: 22 U/L (ref 0–37)
Albumin: 2.7 g/dL — ABNORMAL LOW (ref 3.5–5.2)
BUN: 56 mg/dL — ABNORMAL HIGH (ref 6–23)
CO2: 25 meq/L (ref 19–32)
Calcium: 8.8 mg/dL (ref 8.4–10.5)
Chloride: 99 mEq/L (ref 96–112)
Creatinine, Ser: 3.22 mg/dL — ABNORMAL HIGH (ref 0.50–1.35)
GFR, EST AFRICAN AMERICAN: 19 mL/min — AB (ref >90)
GFR, EST NON AFRICAN AMERICAN: 17 mL/min — AB (ref >90)
Glucose, Bld: 186 mg/dL — ABNORMAL HIGH (ref 70–99)
Potassium: 4.2 mEq/L (ref 3.7–5.3)
SODIUM: 138 meq/L (ref 137–147)
Total Bilirubin: 0.9 mg/dL (ref 0.3–1.2)
Total Protein: 6.9 g/dL (ref 6.0–8.3)

## 2013-09-21 LAB — GLUCOSE, CAPILLARY: Glucose-Capillary: 112 mg/dL — ABNORMAL HIGH (ref 70–99)

## 2013-09-21 LAB — PRO B NATRIURETIC PEPTIDE: Pro B Natriuretic peptide (BNP): 13493 pg/mL — ABNORMAL HIGH (ref 0–450)

## 2013-09-21 MED ORDER — RIVAROXABAN 20 MG PO TABS: 20.0000 mg | ORAL_TABLET | Freq: Every day | ORAL | Status: DC

## 2013-09-21 MED ORDER — METOPROLOL SUCCINATE ER 50 MG PO TB24
50.0000 mg | ORAL_TABLET | Freq: Every day | ORAL | Status: DC
  Administered 2013-09-22 – 2013-09-25 (×4): 50 mg via ORAL
  Filled 2013-09-21 (×4): qty 1

## 2013-09-21 MED ORDER — ENOXAPARIN SODIUM 100 MG/ML ~~LOC~~ SOLN
100.0000 mg | SUBCUTANEOUS | Status: DC
  Administered 2013-09-22 – 2013-09-23 (×2): 100 mg via SUBCUTANEOUS
  Filled 2013-09-21 (×3): qty 1

## 2013-09-21 MED ORDER — SIMVASTATIN 20 MG PO TABS
20.0000 mg | ORAL_TABLET | Freq: Every evening | ORAL | Status: DC
  Administered 2013-09-22 – 2013-09-24 (×3): 20 mg via ORAL
  Filled 2013-09-21 (×6): qty 1

## 2013-09-21 MED ORDER — DOXAZOSIN MESYLATE 4 MG PO TABS
4.0000 mg | ORAL_TABLET | Freq: Two times a day (BID) | ORAL | Status: DC
  Administered 2013-09-22 – 2013-09-23 (×4): 4 mg via ORAL
  Administered 2013-09-24: 2 mg via ORAL
  Filled 2013-09-21 (×6): qty 1

## 2013-09-21 MED ORDER — SODIUM CHLORIDE 0.9 % IV SOLN
125.0000 mg | INTRAVENOUS | Status: DC
  Filled 2013-09-21: qty 10

## 2013-09-21 MED ORDER — ASPIRIN EC 81 MG PO TBEC
81.0000 mg | DELAYED_RELEASE_TABLET | Freq: Every day | ORAL | Status: DC
  Administered 2013-09-22 – 2013-09-25 (×4): 81 mg via ORAL
  Filled 2013-09-21 (×4): qty 1

## 2013-09-21 MED ORDER — FUROSEMIDE 10 MG/ML IJ SOLN
100.0000 mg | Freq: Once | INTRAMUSCULAR | Status: AC
  Administered 2013-09-21: 100 mg via INTRAVENOUS
  Filled 2013-09-21: qty 10

## 2013-09-21 MED ORDER — SODIUM CHLORIDE 0.9 % IJ SOLN: 3.0000 mL | INTRAMUSCULAR | Status: DC | PRN

## 2013-09-21 MED ORDER — SODIUM CHLORIDE 0.9 % IV SOLN
250.0000 mL | INTRAVENOUS | Status: DC | PRN
  Administered 2013-09-21: 250 mL via INTRAVENOUS

## 2013-09-21 MED ORDER — BENZONATATE 100 MG PO CAPS
100.0000 mg | ORAL_CAPSULE | Freq: Two times a day (BID) | ORAL | Status: DC | PRN
  Filled 2013-09-21: qty 1

## 2013-09-21 MED ORDER — SALINE SPRAY 0.65 % NA SOLN
1.0000 | NASAL | Status: DC | PRN
  Filled 2013-09-21: qty 44

## 2013-09-21 MED ORDER — GABAPENTIN 300 MG PO CAPS
300.0000 mg | ORAL_CAPSULE | Freq: Every day | ORAL | Status: DC
  Administered 2013-09-22 – 2013-09-25 (×4): 300 mg via ORAL
  Filled 2013-09-21 (×4): qty 1

## 2013-09-21 MED ORDER — SODIUM CHLORIDE 0.9 % IJ SOLN
3.0000 mL | Freq: Two times a day (BID) | INTRAMUSCULAR | Status: DC
Start: 2013-09-21 — End: 2013-09-25
  Administered 2013-09-21 – 2013-09-25 (×6): 3 mL via INTRAVENOUS

## 2013-09-21 MED ORDER — ISOSORBIDE MONONITRATE ER 60 MG PO TB24
60.0000 mg | ORAL_TABLET | Freq: Every day | ORAL | Status: DC
  Administered 2013-09-22 – 2013-09-25 (×4): 60 mg via ORAL
  Filled 2013-09-21 (×4): qty 1

## 2013-09-21 MED ORDER — TORSEMIDE 20 MG PO TABS
40.0000 mg | ORAL_TABLET | Freq: Two times a day (BID) | ORAL | Status: DC
  Filled 2013-09-21 (×2): qty 2

## 2013-09-21 NOTE — Progress Notes (Signed)
Paged admitting MD on [phone]. Returned call and stated will be up later to see the patient.

## 2013-09-21 NOTE — Progress Notes (Signed)
Patient ID: Todd [name], male   DOB: 11-01-1929, 80 y.o.   MRN: [id number] [facility] Admission History and Physical Service Pager: [phone]  Patient name: Todd [name] Medical record number: [id number] Date of birth: June 13, 1930 Age: 80 y.o. Gender: male  Primary Care Provider: Willeen Niece, MD Consultants: Cardiology/Heart Failure Team; Nephrology Code Status: Full  Chief Complaint: Volume overload, fatigue/lethargy  Assessment and Plan: Todd [name] is a 80 y.o. male presenting with lethargy, worsening dyspnea and increasing serum Cr.  He appears volume -overloaded.  #1: CHF: Appears volume overloaded, not responding with increased dose of torsemide since his most recent outpatient Cardiology office visit on Feb 25th.  Concern for worsening EF that is contributing to his renal dysfunction as cardio-renal syndrome.  For CHF Team consult and input about how best to diurese Mr. Brink.  Additionally, I wonder if his is a candidate for R heart cath and possible inotrope (milrinone) for symptomatic improvement.  #2: Acute renal failure: Patient's creatinine has continued to increase over the past 1 month; had renal US done at last admission within the past month that was negative for obstructive renal disease.  UA shows some blood as well.  Concern for renal hypoperfusion in setting of worsening CHF.  Will ask for Renal consult to evaluate for other possible causes of medical renal disease.   #3: Anemia; gradual downward trend in Hgb over the past month.  On Xarelto, has had intermittent epistaxis and hemoptysis.  His cough has improved somewhat in the past 1-2 weeks, although he still has blood in the sputum.  No fevers/chills or other signs/sxs of recurrent PNA. CXR ordered (more for evaluation of possible pulm edema than for suspicion of pulmonary infectious process).   FEN/GI: heart-healthy; low-sodium Prophylaxis: On Xarelto  Disposition:    History of  Present Illness: Todd [name] is a 80 y.o. male presenting with progressively worsening lethargy and lower extremity edema.  He has been noted to have worsening renal function (baseline Cr @1 .0; most recent Cr 2.92) . PMH is significant for metastatic colon cancer; systolic HF with most recent EF 35-40% on ECHO 10 months ago.  Also with history of VTE events; most recently PE diagnosed Sept 2014, has been maintained on Xarelto since that time in setting of recurrent VTEs and malignancy.    Review Of Systems: Per HPI with the following additions: Negative for fevers/chills, no chest pain, dyspnea improved since recent initiation of oxygen 2L/min nasal canula; no N/V, has had poor appetite. No blood per ostomy.  Has had some diarrhea yesterday.  Recently started iron tablets for anemia which has made his stool dark (temporally related to the iron).  Worsening bilat ankle swelling.  Generalized weakness.  Otherwise 12 point review of systems was performed and was unremarkable.  Patient Active Problem List   Diagnosis Date Noted  . CHF (congestive heart failure) 09/21/2013  . Normocytic anemia 09/19/2013  . Hypoxemia 09/19/2013  . Hemoptysis 09/12/2013  . Chronic pulmonary embolism 09/04/2013  . Acute kidney injury 09/04/2013  . HCAP (healthcare-associated pneumonia) 09/02/2013  . Dizziness and giddiness 08/03/2013  . Heel pain 07/27/2013  . Essential hypertension 05/21/2013  . Skin irritation 04/17/2013  . Protein-calorie malnutrition, severe 04/02/2013  . Acute pulmonary embolism- 04/02/13 04/02/2013  . Acute on chronic combined systolic and diastolic congestive heart failure 04/02/2013  . Diastolic dysfunction- grade 24 September 2012 04/02/2013  . Dyspnea 03/09/2013  . Myocardial infarction'1995   . Arthritis, rheumatoid   . BPH (benign  prostatic hyperplasia)   . Neuropathy   . Epistaxis 01/02/2013  . Onychomycosis 01/02/2013  . Right knee pain 12/25/2012  . Colon cancer metastasized to liver  09/19/2012  . CAD, Ant MI 1990- total LAD with collaterals, Myoview low risk 3/12 09/06/2012  . Heart murmur 09/06/2012  . Nausea with vomiting and diarrhea 09/05/2012  . Elevated lactic acid level 09/05/2012  . Elevated lipase 09/05/2012  . Leukocytosis, unspecified 09/05/2012  . Prostate enlargement 09/05/2012  . Seborrheic keratosis 07/07/2012  . Hip pain, right 08/10/2011  . Right elbow pain 05/26/2011  . Tinea pedis 10/23/2010  . HYPERTENSION 07/22/2009  . PROSTATE SPECIFIC ANTIGEN, ELEVATED 07/22/2009  . DIABETES MELLITUS, CONTROLLED, WITHOUT COMPLICATIONS 03/25/2009  . BACK PAIN, LUMBAR 03/25/2009  . COUGH 09/09/2008  . Olecranon bursitis 11/16/2007  . ROTATOR CUFF INJURY, RIGHT SHOULDER 05/04/2007  . HYPERLIPIDEMIA 09/22/2006  . Gout, unspecified 09/22/2006  . OBESITY, NOS 09/22/2006  . Ischemic cardiomyopathy, EF 35-40% by echo March 2014 09/22/2006  . Hyperplasia of prostate 09/22/2006  . OSTEOARTHRITIS OF SPINE, NOS 09/22/2006   Past Medical History: Past Medical History  Diagnosis Date  . Myocardial infarction 1990    "Massive"- total LAD with collaterals  . Gout   . Hypertension   . Arthritis, rheumatoid   . Diabetes mellitus without complication   . CHF (congestive heart failure)   . BPH (benign prostatic hyperplasia)   . Neuropathy   . Hyperlipidemia   . Ischemic cardiomyopathy March 2012    EF 31% Myoview. Pt declines ICD  . Colon cancer 2014    Surg, chemo March 2014   Past Surgical History: Past Surgical History  Procedure Laterality Date  . Cataract extraction    . Flexible sigmoidoscopy N/A 09/05/2012    Procedure: FLEXIBLE SIGMOIDOSCOPY;  Surgeon: Barrie Folk, MD;  Location: [facility] ENDOSCOPY;  Service: Endoscopy;  Laterality: N/A;  Give 1000 cc tapwater enema on call to endoscopy.  . Colon resection N/A 09/07/2012    Procedure: LAPAROSCOPIC ASSISTED SIGMOID COLON RESECTION END OSTOMY;  Surgeon: Kandis Cocking, MD;  Location: [facility] OR;  Service: General;   Laterality: N/A;  . Portacath placement N/A 09/07/2012    Procedure: Placement of power port;  Surgeon: Kandis Cocking, MD;  Location: [facility] OR;  Service: General;  Laterality: N/A;   Social History: History  Substance Use Topics  . Smoking status: Former Smoker -- 1.00 packs/day    Quit date: 04/01/2009  . Smokeless tobacco: Never Used     Comment: Quit 45 years ago.  . Alcohol Use: No   Additional social history: Nonsmoker.  Here today with his wife and daughter, both very involved in his care.   Please also refer to relevant sections of EMR.  Family History: Family History  Problem Relation Age of Onset  . Stroke Father   . Diabetes Father   . Hypertension Father   . Heart attack Father   . Hyperlipidemia Neg Hx   . Sudden death Neg Hx    Allergies and Medications: No Known Allergies Current Outpatient Prescriptions on File Prior to Visit  Medication Sig Dispense Refill  . aspirin EC 81 MG tablet Take 81 mg by mouth daily.      . benzonatate (TESSALON) 100 MG capsule Take 1 capsule (100 mg total) by mouth 2 (two) times daily as needed for cough.  20 capsule  0  . chlorhexidine (PERIDEX) 0.12 % solution Use as directed 15 mLs in the mouth or throat 2 (two) times  daily as needed (for mouth irritation).      Marland Kitchen diclofenac sodium (VOLTAREN) 1 % GEL Apply 2 g topically 3 (three) times daily as needed (for pain).      Marland Kitchen diphenoxylate-atropine (LOMOTIL) 2.5-0.025 MG per tablet Take 2 tablets by mouth 4 (four) times daily as needed for diarrhea or loose stools.      . doxazosin (CARDURA) 8 MG tablet Take 4 mg by mouth 2 (two) times daily.      Marland Kitchen gabapentin (NEURONTIN) 300 MG capsule Take 300 mg by mouth daily.      . iron polysaccharides (NU-IRON) 150 MG capsule Take 1 capsule (150 mg total) by mouth daily.  30 capsule  5  . isosorbide mononitrate (IMDUR) 60 MG 24 hr tablet Take 60 mg by mouth daily.      Marland Kitchen lidocaine-prilocaine (EMLA) cream Apply 1 application topically as needed (to  port).      . metoprolol succinate (TOPROL-XL) 50 MG 24 hr tablet Take 1 tablet (50 mg total) by mouth daily. Take with or immediately following a meal.  90 tablet  4  . Multiple Vitamin (MULTIVITAMIN) tablet Take 1 tablet by mouth daily.        . mupirocin cream (BACTROBAN) 2 % Apply 1 application topically 2 (two) times daily as needed (for skin irritation around stoma).      . nitroGLYCERIN (NITROSTAT) 0.4 MG SL tablet Place 0.4 mg under the tongue every 5 (five) minutes as needed for chest pain.      . Nutritional Supplements (ENSURE PO) Take 1 Can by mouth daily.      [name] Glycol-Propyl Glycol (SYSTANE OP) Place 1 drop into both eyes daily.       . potassium chloride (K-DUR) 10 MEQ tablet Take 1 tablet (10 mEq total) by mouth daily.  30 tablet  4  . Rivaroxaban (XARELTO) 20 MG TABS tablet Take 20 mg by mouth daily with breakfast.       . simvastatin (ZOCOR) 40 MG tablet Take 20 mg by mouth every evening.      . sodium chloride (OCEAN) 0.65 % SOLN nasal spray Place 1 spray into both nostrils as needed for congestion.      . torsemide (DEMADEX) 20 MG tablet Take 2 tablets (40 mg total) by mouth 2 (two) times daily. Take 20 to 40mg  by mouth one time daily as directed by physician.  120 tablet  3  . vitamin E 400 UNIT capsule Take 400 Units by mouth daily.       No current facility-administered medications on file prior to visit.    Objective: BP 137/83  Pulse 80  Ht 5\' 9"  (1.753 m)  Wt 213 lb (96.616 kg)  BMI 31.44 kg/m2  SpO2 100% Exam: General: noticeable pallor, speaking fluidly in no distress.  HEENT: Notable conjunctival pallor. PERRL. Mildly dry mucus membranes. Neck supple. No adenopathy.  Cardiovascular: Regular G3T5, notable systolic murmur Respiratory: good air movement; no rales or wheezes.  Abdomen: soft; stoma site clean. Nontender.  Extremities: 3+ bilat lower extremity edema with pitting.  Skin: dry, warm. Neuro: able to get to exam table with 1-point  assist.  Labs and Imaging: CBC BMET   Recent Labs Lab 09/19/13 1337  WBC 8.6  HGB 8.1*  HCT 24.0*  PLT 259    Recent Labs Lab 09/19/13 1337  NA 136  K 4.5  CL 101  CO2 26  BUN 52*  CREATININE 2.92*  GLUCOSE 109*  CALCIUM 8.7  Called to resident team and attending on FMTS to discuss admission plans for direct admit for telemetry admission.   Willeen Niece, MD 09/21/2013, 12:25 PM PGY-attending, [facility] pager: [phone], text pages welcome

## 2013-09-21 NOTE — Progress Notes (Signed)
On call family practice MD was called. No further orders. RN told to watch patient for any distress or abnormalities. Will continue to monitor.

## 2013-09-21 NOTE — Progress Notes (Signed)
Lovenox per pharmacy  Anticoag: Has hx of PE in 2014 and was put on xarelto 20mg  po daily for that.  Patient had intermittent epistaxis and hemoptysis while on xarelto.  Patient is in acute renal failure Todd [name] team wants to switch to lovenox instead.  Per patient, he took his last dose of xarelto on 09/21/13 at ~0800  4hr LMWH level goal is 0.6-1.2  Hem/Onc: Hgb 8.1 and Plt 259 K on 02/25. Hx of colon cancer Renal: Acute renal failure; SCr has been rising the past month.  [place] was negative for obstructive disease.  SCr 2.92 (CrCl ~23); other lytes ok  Plan: 1) Start lovenox at 100 mg sq q24h, 1st dose at 1000 tomorrow 2) CBC every 72 hours 3) Monitor renal function

## 2013-09-21 NOTE — Progress Notes (Signed)
Zandon Talton [id number] Code Status: Full   Admission Data: 09/21/2013 2:21 PM Attending Provider:  Eniola  XHB:ZJIRC,VELFY O, MD Consults/ Treatment Team:    Alfie Rideaux is a 80 y.o. male patient admitted from ED awake, alert - oriented  X 3 - no acute distress noted.  VSS - Blood pressure 148/69, pulse 71, temperature 98.1 F (36.7 C), temperature source Oral, resp. rate 20, height 5\' 9"  (1.753 m), weight 96.389 kg (212 lb 8 oz), SpO2 88.00%.  no c/o shortness of breath, no c/o chest pain.  O2:   @ 2 l/min per [place].   Allergies:  No Known Allergies   Past Medical History  Diagnosis Date  . Myocardial infarction 1990    "Massive"- total LAD with collaterals  . Gout   . Hypertension   . Arthritis, rheumatoid   . Diabetes mellitus without complication   . CHF (congestive heart failure)   . BPH (benign prostatic hyperplasia)   . Neuropathy   . Hyperlipidemia   . Ischemic cardiomyopathy March 2012    EF 31% Myoview. Pt declines ICD  . Colon cancer 2014    Surg, chemo March 2014   Medications Prior to Admission  Medication Sig Dispense Refill  . aspirin EC 81 MG tablet Take 81 mg by mouth daily.      . benzonatate (TESSALON) 100 MG capsule Take 1 capsule (100 mg total) by mouth 2 (two) times daily as needed for cough.  20 capsule  0  . chlorhexidine (PERIDEX) 0.12 % solution Use as directed 15 mLs in the mouth or throat 2 (two) times daily as needed (for mouth irritation).      Marland Kitchen diclofenac sodium (VOLTAREN) 1 % GEL Apply 2 g topically 3 (three) times daily as needed (for pain).      Marland Kitchen diphenoxylate-atropine (LOMOTIL) 2.5-0.025 MG per tablet Take 2 tablets by mouth 4 (four) times daily as needed for diarrhea or loose stools.      . doxazosin (CARDURA) 8 MG tablet Take 4 mg by mouth 2 (two) times daily.      Marland Kitchen gabapentin (NEURONTIN) 300 MG capsule Take 300 mg by mouth daily.      . iron polysaccharides (NU-IRON) 150 MG capsule Take 1 capsule (150 mg total) by mouth daily.  30 capsule   5  . isosorbide mononitrate (IMDUR) 60 MG 24 hr tablet Take 60 mg by mouth daily.      Marland Kitchen lidocaine-prilocaine (EMLA) cream Apply 1 application topically as needed (to port).      . metoprolol succinate (TOPROL-XL) 50 MG 24 hr tablet Take 1 tablet (50 mg total) by mouth daily. Take with or immediately following a meal.  90 tablet  4  . Multiple Vitamin (MULTIVITAMIN) tablet Take 1 tablet by mouth daily.        . mupirocin cream (BACTROBAN) 2 % Apply 1 application topically 2 (two) times daily as needed (for skin irritation around stoma).      . nitroGLYCERIN (NITROSTAT) 0.4 MG SL tablet Place 0.4 mg under the tongue every 5 (five) minutes as needed for chest pain.      . Nutritional Supplements (ENSURE PO) Take 1 Can by mouth daily.      [name] Glycol-Propyl Glycol (SYSTANE OP) Place 1 drop into both eyes daily.       . potassium chloride (K-DUR) 10 MEQ tablet Take 1 tablet (10 mEq total) by mouth daily.  30 tablet  4  . Rivaroxaban (XARELTO) 20 MG TABS  tablet Take 20 mg by mouth daily with breakfast.       . simvastatin (ZOCOR) 40 MG tablet Take 20 mg by mouth every evening.      . sodium chloride (OCEAN) 0.65 % SOLN nasal spray Place 1 spray into both nostrils as needed for congestion.      . torsemide (DEMADEX) 20 MG tablet Take 2 tablets (40 mg total) by mouth 2 (two) times daily. Take 20 to 40mg  by mouth one time daily as directed by physician.  120 tablet  3  . vitamin E 400 UNIT capsule Take 400 Units by mouth daily.        Orientation to room, and floor completed with information packet given to patient/family.  Admission INP armband ID verified with patient/family, and in place.   SR up x 2, fall assessment complete, with patient and family able to verbalize understanding of risk associated with falls, and verbalized understanding to call nsg before up out of bed.  Call light within reach, patient able to voice, and demonstrate understanding.      Will cont to eval and treat per  MD orders.  Henriette Combs, [place] 09/21/2013 2:21 PM

## 2013-09-21 NOTE — Consult Note (Signed)
Referring Provider: No ref. provider found Primary Care Physician:  Willeen Niece, MD Primary Nephrologist:  none  Reason for Consultation: Acute non oliguric renal failure with worsening edema weight gain and shortness of breath HPI: -year-old, mildly overweight, married Caucasian male, father of 83, grandfather to 5 grandchildren without is formerly a patient of Dr. Aldona Bar. Dr. Gwenlyn Found assumed his care in Dr. Eddie Dibbles absence. He has a history of ischemic heart disease status post anterior wall myocardial infarction in 1990, at which time he underwent cardiac catheterization revealing a total LAD with collaterals. His EF at that time was 30%. His other problems include hypertension and hyperlipidemia as well as gout. He was recently diagnosed with colon cancer and underwent resection by Dr. Alphonsa Overall with a colostomy. He has undergone chemotherapy administered by Dr. Alen Blew with reduced dose FOLFIRI with Avastin. Diagnosed last summer with metastatic colon cancer to liver. Baseline creatinine has been in the normal range. Patient also has chronic tophaceous goat and pain in knees and takes voltaren orally.  Creatinine 2/6    1.5 Creatinine 2/8    1.5 Creatinine 2/10  1.6                      U/S  No hydronephrosis Creatinine 2/20  2.2 Creatinine 2/25  2.9 Creatinine 2/27  3.2   CT  9/14  Interval decrease in size of dominant hepatic and right adrenal  metastases. No new evidence for metastatic disease elsewhere in the  abdomen or pelvis.       Past Medical History  Diagnosis Date  . Myocardial infarction 1990    "Massive"- total LAD with collaterals  . Gout   . Hypertension   . Arthritis, rheumatoid   . Diabetes mellitus without complication   . CHF (congestive heart failure)   . BPH (benign prostatic hyperplasia)   . Neuropathy   . Hyperlipidemia   . Ischemic cardiomyopathy March 2012    EF 31% Myoview. Pt declines ICD  . Colon cancer 2014    Surg, chemo March 2014  .  Mitral regurgitation, mod to severe 09/2012 echo 09/21/2013    Past Surgical History  Procedure Laterality Date  . Cataract extraction    . Flexible sigmoidoscopy N/A 09/05/2012    Procedure: FLEXIBLE SIGMOIDOSCOPY;  Surgeon: Missy Sabins, MD;  Location: [place];  Service: Endoscopy;  Laterality: N/A;  Give 1000 cc tapwater enema on call to endoscopy.  . Colon resection N/A 09/07/2012    Procedure: LAPAROSCOPIC ASSISTED SIGMOID COLON RESECTION END OSTOMY;  Surgeon: Shann Medal, MD;  Location: [place];  Service: General;  Laterality: N/A;  . Portacath placement N/A 09/07/2012    Procedure: Placement of power port;  Surgeon: Shann Medal, MD;  Location: [place];  Service: General;  Laterality: N/A;    Prior to Admission medications   Medication Sig Start Date End Date Taking? Authorizing Provider  aspirin EC 81 MG tablet Take 81 mg by mouth daily.   Yes Historical Provider, MD  benzonatate (TESSALON) 100 MG capsule Take 1 capsule (100 mg total) by mouth 2 (two) times daily as needed for cough. 09/17/13  Yes Willeen Niece, MD  chlorhexidine (PERIDEX) 0.12 % solution Use as directed 15 mLs in the mouth or throat 2 (two) times daily.    Yes Historical Provider, MD  CINNAMON PO Take 1 tablet by mouth daily.   Yes Historical Provider, MD  diclofenac sodium (VOLTAREN) 1 % GEL Apply 2 g  topically 3 (three) times daily as needed (for pain).   Yes Historical Provider, MD  diphenoxylate-atropine (LOMOTIL) 2.5-0.025 MG per tablet Take 2 tablets by mouth 4 (four) times daily as needed for diarrhea or loose stools.   Yes Historical Provider, MD  doxazosin (CARDURA) 8 MG tablet Take 4 mg by mouth 2 (two) times daily.   Yes Historical Provider, MD  gabapentin (NEURONTIN) 300 MG capsule Take 300 mg by mouth every evening.    Yes Historical Provider, MD  iron polysaccharides (NU-IRON) 150 MG capsule Take 1 capsule (150 mg total) by mouth daily. 09/20/13  Yes Tarri Fuller, PA-C  isosorbide mononitrate (IMDUR) 60  MG 24 hr tablet Take 60 mg by mouth daily.   Yes Historical Provider, MD  lidocaine-prilocaine (EMLA) cream Apply 1 application topically daily as needed (to port). For port   Yes Historical Provider, MD  metoprolol succinate (TOPROL-XL) 50 MG 24 hr tablet Take 1 tablet (50 mg total) by mouth daily. Take with or immediately following a meal. 09/07/13  Yes Willeen Niece, MD  Multiple Vitamin (MULTIVITAMIN) tablet Take 1 tablet by mouth every evening.    Yes Historical Provider, MD  mupirocin cream (BACTROBAN) 2 % Apply 1 application topically 2 (two) times daily as needed (for skin irritation around stoma).   Yes Historical Provider, MD  nitroGLYCERIN (NITROSTAT) 0.4 MG SL tablet Place 0.4 mg under the tongue every 5 (five) minutes as needed for chest pain.   Yes Historical Provider, MD  Nutritional Supplements (ENSURE PO) Take 1 Can by mouth daily.   Yes Historical Provider, MD  Polyethyl Glycol-Propyl Glycol (SYSTANE OP) Place 1 drop into both eyes daily.    Yes Historical Provider, MD  potassium chloride (K-DUR) 10 MEQ tablet Take 1 tablet (10 mEq total) by mouth daily. 09/19/13  Yes Tarri Fuller, PA-C  Rivaroxaban (XARELTO) 20 MG TABS tablet Take 20 mg by mouth daily with breakfast.    Yes Historical Provider, MD  simvastatin (ZOCOR) 40 MG tablet Take 20 mg by mouth every evening.   Yes Historical Provider, MD  sodium chloride (OCEAN) 0.65 % SOLN nasal spray Place 1 spray into both nostrils as needed for congestion.   Yes Historical Provider, MD  torsemide (DEMADEX) 20 MG tablet Take 2 tablets (40 mg total) by mouth 2 (two) times daily. Take 20 to 40mg  by mouth one time daily as directed by physician. 09/19/13  Yes Tarri Fuller, PA-C  vitamin E 400 UNIT capsule Take 400 Units by mouth daily.   Yes Historical Provider, MD    Current Facility-Administered Medications  Medication Dose Route Frequency Provider Last Rate Last Dose  . 0.9 %  sodium chloride infusion  250 mL Intravenous PRN Willeen Niece,  MD 10 mL/hr at 09/21/13 1844 250 mL at 09/21/13 1844  . [START ON 09/22/2013] aspirin EC tablet 81 mg  81 mg Oral Daily Willeen Niece, MD      . benzonatate (TESSALON) capsule 100 mg  100 mg Oral BID PRN Willeen Niece, MD      . Derrill Memo ON 09/22/2013] doxazosin (CARDURA) tablet 4 mg  4 mg Oral BID Willeen Niece, MD      . Derrill Memo ON 09/22/2013] enoxaparin (LOVENOX) injection 100 mg  100 mg Subcutaneous Q24H Andrena Mews, MD      . Derrill Memo ON 09/22/2013] ferric gluconate (NULECIT) 125 mg in sodium chloride 0.9 % 100 mL IVPB  125 mg Intravenous Q T,Th,Sa-HD Sherril Croon, MD      .  furosemide (LASIX) 100 mg in dextrose 5 % 50 mL IVPB  100 mg Intravenous Once Cecilie Kicks, NP   100 mg at 09/21/13 1849  . [START ON 09/22/2013] gabapentin (NEURONTIN) capsule 300 mg  300 mg Oral Daily Willeen Niece, MD      . Derrill Memo ON 09/22/2013] isosorbide mononitrate (IMDUR) 24 hr tablet 60 mg  60 mg Oral Daily Willeen Niece, MD      . Derrill Memo ON 09/22/2013] metoprolol succinate (TOPROL-XL) 24 hr tablet 50 mg  50 mg Oral Daily Willeen Niece, MD      . simvastatin (ZOCOR) tablet 20 mg  20 mg Oral QPM Willeen Niece, MD      . sodium chloride (OCEAN) 0.65 % nasal spray 1 spray  1 spray Each Nare PRN Willeen Niece, MD      . sodium chloride 0.9 % injection 3 mL  3 mL Intravenous Q12H Willeen Niece, MD   3 mL at 09/21/13 1849  . sodium chloride 0.9 % injection 3 mL  3 mL Intravenous PRN Willeen Niece, MD        Allergies as of 09/21/2013  . (No Known Allergies)    Family History  Problem Relation Age of Onset  . Stroke Father   . Diabetes Father   . Hypertension Father   . Heart attack Father   . Hyperlipidemia Neg Hx   . Sudden death Neg Hx     History   Social History  . Marital Status: Married    Spouse Name: N/A    Number of Children: 2  . Years of Education: N/A   Occupational History  . Retired, food and nutritional services    Social History Main Topics  . Smoking status: Former Smoker -- 1.00  packs/day    Quit date: 04/01/2009  . Smokeless tobacco: Never Used     Comment: Quit 45 years ago.  . Alcohol Use: No  . Drug Use: No  . Sexual Activity: Not Currently   Other Topics Concern  . Not on file   Social History Narrative   Emergency Contact:wife, Fern Asmar at Agency:   Who lives with you: Lives with wife in 1 story home.   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  Has a varied diet and is currently trying to lose weight by decreasing protein and carbohydrates daily.   Exercise: water aerobix 3x a week for 45 mins and uses row bike 2 times a week for 20 minutes   Seatbelts: wears seatbelt while in vehicles   Sun Exposure/Protection: Patient reports wearing sun screen   Hobbies: numbers, cruises             Review of Systems: Gen: + anorexia, fatigue, weakness, malaise HEENT: No visual complaints, No history of Retinopathy. Normal external appearance No Epistaxis or Sore throat. No sinusitis.   CV: Denies chest pain, angina, palpitations, syncope, orthopnea, PND, peripheral edema, and claudication. Resp: + dyspnea at rest, dyspnea with exercise,  No cough, sputum, wheezing, + coughing up blood, and pleurisy. GI: Denies vomiting blood, jaundice, and fecal incontinence.   Denies dysphagia or odynophagia. GU : Denies urinary burning, blood in urine, urinary frequency, urinary hesitancy, nocturnal urination, and urinary incontinence.  No renal calculi. MS: +  joint pain, limitation of movement, and swelling, stiffness, no low back pain, extremity pain. Denies muscle weakness,  cramps, atrophy.  + use of non steroidal antiinflammatory drugs. Derm: Denies rash, itching, dry skin, hives, moles, warts, or unhealing ulcers.  Psych: Denies depression, anxiety, memory loss, suicidal ideation, hallucinations, paranoia, and confusion. Heme: Denies bruising, bleeding, and enlarged lymph nodes.  Metastatic liver disease Neuro: No headache.  No diplopia. No dysarthria.  No dysphasia.  No history of CVA.  No Seizures. No paresthesias.  No weakness. Endocrine No DM.  No Thyroid disease.  No Adrenal disease.  Physical Exam: Vital signs in last 24 hours: Temp:  [98.1 F (36.7 C)] 98.1 F (36.7 C) (02/27 1414) Pulse Rate:  [71-80] 71 (02/27 1414) Resp:  [20] 20 (02/27 1414) BP: (137-148)/(69-83) 148/69 mmHg (02/27 1414) SpO2:  [88 %-100 %] 88 % (02/27 1414) Weight:  [96.389 kg (212 lb 8 oz)-96.616 kg (213 lb)] 96.389 kg (212 lb 8 oz) (02/27 1414) Last BM Date: 09/20/13 General:  Chronic ill appearing Head:  Normocephalic and atraumatic. Eyes:  Sclera clear, no icterus.   Conjunctiva Pale Ears:  Normal auditory acuity. Nose:  No deformity, discharge,  or lesions. Mouth:  No deformity or lesions, dentition normal. Neck:  Supple; no masses or thyromegaly. JVP not elevated Lungs: basal crackles.   No wheezes,. No acute distress. Using oxygen Heart:  Regular rate and rhythm; no murmurs, clicks, rubs,  or gallops. Abdomen:  Soft, nontender and nondistended. No masses, hepatosplenomegaly or hernias noted. Normal bowel sounds, without guarding, and without rebound.   Msk:  Symmetrical without gross deformities. Normal posture. Gouty tophi Pulses:  No carotid, renal, femoral bruits. DP and PT symmetrical and equal Extremities:  Without clubbing  3 + lower extremity edema Neurologic:  Alert and  oriented x4;  grossly normal neurologically. Skin:  Intact without significant lesions or rashes.   Intake/Output from previous day:   Intake/Output this shift:    Lab Results:  Recent Labs  09/19/13 1337 09/21/13 1525  WBC 8.6 8.9  HGB 8.1* 7.6*  HCT 24.0* 23.0*  PLT 259 213   BMET  Recent Labs  09/19/13 1337 09/21/13 1525  NA 136 138  K 4.5 4.2  CL 101 99  CO2 26 25  GLUCOSE 109* 186*  BUN 52* 56*  CREATININE 2.92* 3.22*  CALCIUM 8.7 8.8   LFT  Recent Labs   09/21/13 1525  PROT 6.9  ALBUMIN 2.7*  AST 22  ALT 8  ALKPHOS 42  BILITOT 0.9   PT/INR No results found for this basename: LABPROT, INR,  in the last 72 hours Hepatitis Panel No results found for this basename: HEPBSAG, HCVAB, HEPAIGM, HEPBIGM,  in the last 72 hours  Studies/Results: X-ray Chest Pa And Lateral   09/21/2013   CLINICAL DATA:  Shortness of breath, coughing up blood  EXAM: CHEST  2 VIEW  COMPARISON:  Chest x-ray of 09/12/2013  FINDINGS: There is more opacity particular at the left lung base suspicious for pneumonia. In addition, there is cardiomegaly present with mild pulmonary vascular congestion and at least a small right pleural effusion. These findings may indicate superimposed mild congestive heart failure as well. Right-sided central venous line tip overlies the expected SVC -RA junction. Diffuse degenerative change is noted throughout the thoracic spine.  IMPRESSION: 1. Increased opacity at the bases may represent pneumonia as well as atelectasis. Recommend continued followup. 2. Cardiomegaly with pulmonary vascular congestion and small right effusion. Possible mild CHF.   Electronically Signed   By: Ivar Drape M.D.   On: 09/21/2013 15:10    Assessment/Plan:  Spyros Balko is a 80 y.o. male presenting with lethargy, worsening dyspnea and increasing serum Cr. He appears volume -overloaded  1. Acute renal failure appears to be in the setting of ACE  Benazepril and use of voltaren NSAIDS. Clear that he is not wishing to pursue dialysis and I would not recommend this either. Spoke with daughter and she is interested in palliative care. This approach seems the most reasonable with limits set in terms of treatment.  2. HTN/volume  Appears markedly edematous and will diurese patient  3. Anemia iron deficiency will give IV iron  4. CHF  Continue IV diuresis  5. Metastatic Colon cancer  No obstruction seen on recent U/S  LOS: 0 Mykah Bellomo W @TODAY @7 :05 PM

## 2013-09-21 NOTE — Assessment & Plan Note (Signed)
See H& P.

## 2013-09-21 NOTE — Progress Notes (Signed)
Pt admitted today from clinic due to increasing in lethargy and dyspnea concerning for fluid overload and AKI.  He reports feeling better and hopes this event to resolve. Cardiology and Nephrology has been consulted, appreciative of evaluation and recommendations. Physical Exam Gen:  Pleasant and conversational pt, NAD. Pt able to speak in full sentences without evidence of SOB  HEENT: pale and moist mucous membranes. Neck supple. JVD present . No adenopathies.  CV: Regular rate and rhythm. II-VI systolic murmur. No gallops. PULM: diminished breath sounds throughout. Bibasal rales present. ABD: Soft, non tender, normal bowel sounds. Colostomy appears patent. EXT: +3 pitting edema up to  Neuro: Alert and oriented x3. No focalization  Psych: Normal mood and affect. Normal speech. Normal though process.  Assessment and plan: 1. CHF exacerbation. Will f/u Nephrology recs in order to maximize diuresis and avoid dialysis. ECHO pending and appreciate cards recs. Will get strict I/O's and daily weight to monitor progress.  2. Recent Hx of PE and pt with metastatic colon cancer: pt was on Xarelto, due to kidney function this was discontinued and placed on Lovenox. 3. AKI: concern for cardio-renal syndrome and also the use of ACEi and NSAIDs. Nephrology consulted and will f/u recs as explained above. 4. Anemia: IV iron, will trend Hb and if critical value or symptomatic will consider transfusion. 5.Recent PNA trated with abx and pt finished course. No fever, normal WBC, no cough or sputum production. Findings on CXR can be residual for prior process or his pulmonary congestion. Will monitor clinically and repeat CXR or start abx if needed.  Signed: D. [place] Philippa Sicks, MD Family Medicine  PGY-3

## 2013-09-21 NOTE — Progress Notes (Signed)
RN walked into room to ask patient if he needed anything and patient showed RN his bloody sputum in the sputum cup. RN asked patient if it had been red all night and patient stated no (it was tan earlier). RN assessed patient and asked that he continues to spit up whatever he can. RN paging on call MD now.

## 2013-09-21 NOTE — Telephone Encounter (Signed)
Todd Sabal, RN with [facility] called.  Pt's O2 with Ambulation/room air is 83%.  O2 room air resting is 96%.  Pt needs Rx faxed to [facility] [phone]) for Home O2 system and portable system of O2.  Rx signed by Dr.[name] and faxed to [facility].  Fax placed in has been faxed pile for 09/19/2013.  Todd [name], Todd [name], Todd [name]

## 2013-09-21 NOTE — Consult Note (Signed)
Reason for Consult: acute  On chronic combined systolic and diastolic CHF with renal failure and acute anemia  Referring Physician: Dr. Gwendlyn Deutscher PCP: Willeen Niece, MD Primary Cardiologist:Dr. Val Riles Lipe is an 80 y.o. male.    Chief Complaint:  Worsening dyspnea and renal failure.  Found to be more anemic.  HPI: 80 year old, mildly overweight, married Caucasian male, father of 2, grandfather to 5 grandchildren without is formerly a patient of Dr. Aldona Bar. Dr. Gwenlyn Found assumed his care in Dr. Eddie Dibbles absence. He has a history of ischemic heart disease status post anterior wall myocardial infarction in 1990, at which time he underwent cardiac catheterization revealing a total LAD with collaterals. His EF at that time was 30%.  His other problems include hypertension and hyperlipidemia as well as gout. He was recently diagnosed with colon cancer and underwent resection by Dr. Alphonsa Overall with a colostomy. He has undergone chemotherapy administered by Dr. Alen Blew with reduced dose FOLFIRI with Avastin. A 2-D echo performed 09/2012 revealed an EF in the 35% range with wall motion abnormality consistent with his LAD occlusion. He had grade 2 diastolic dysfunction. His PA pk pressure was 51 mmHg. Hx 04/05/2013 for pulmonary emboli on xarelto.  No chest pain.  His dry weight is considered 200-205 pounds.   Recent hospitalization for HCAP. Continued on antibiotics. Was seen  09/12/13 with increasing weight and lower ext edema and he had developed sudden acute DOE and had to stop and rest, no chest pain. He also has elevated Cr. His ACE is on hold and his HCTZ was stopped.  09/12/13 he had a CXR with minimal bil pl effusions and cardiomegaly. He also complains of hemoptysis since his discharge. Some streaked mucus and some bright blood. He is on Xarelto. Weight is up to 211 though he has on winter clothes, his dry weight is most likely lower after recent PNA and decreased appetite.  Diuretics  were increased but by the next visit his weight had further increased as well as his creatine. His Hgb has slowly declined as well.  He was very weak yesterday.   He saw PCP today for follow up visit with increased pallor and increased SOB.   He was admitted for further treatment.   Wt today is 213.   Cr. Up to 3.22 he was 1.57 09/05/13    Past Medical History  Diagnosis Date  . Myocardial infarction 1990    "Massive"- total LAD with collaterals  . Gout   . Hypertension   . Arthritis, rheumatoid   . Diabetes mellitus without complication   . CHF (congestive heart failure)   . BPH (benign prostatic hyperplasia)   . Neuropathy   . Hyperlipidemia   . Ischemic cardiomyopathy March 2012    EF 31% Myoview. Pt declines ICD  . Colon cancer 2014    Surg, chemo March 2014  . Mitral regurgitation, mod to severe 09/2012 echo 09/21/2013    Past Surgical History  Procedure Laterality Date  . Cataract extraction    . Flexible sigmoidoscopy N/A 09/05/2012    Procedure: FLEXIBLE SIGMOIDOSCOPY;  Surgeon: Missy Sabins, MD;  Location: [facility];  Service: Endoscopy;  Laterality: N/A;  Give 1000 cc tapwater enema on call to endoscopy.  . Colon resection N/A 09/07/2012    Procedure: LAPAROSCOPIC ASSISTED SIGMOID COLON RESECTION END OSTOMY;  Surgeon: Shann Medal, MD;  Location: [facility];  Service: General;  Laterality: N/A;  . Portacath placement N/A  09/07/2012    Procedure: Placement of power port;  Surgeon: Shann Medal, MD;  Location: [facility];  Service: General;  Laterality: N/A;    Family History  Problem Relation Age of Onset  . Stroke Father   . Diabetes Father   . Hypertension Father   . Heart attack Father   . Hyperlipidemia Neg Hx   . Sudden death Neg Hx    Social History:  reports that he quit smoking about 4 years ago. He has never used smokeless tobacco. He reports that he does not drink alcohol or use illicit drugs.  Allergies: No Known Allergies  Medications Prior to Admission    Medication Sig Dispense Refill  . aspirin EC 81 MG tablet Take 81 mg by mouth daily.      . benzonatate (TESSALON) 100 MG capsule Take 1 capsule (100 mg total) by mouth 2 (two) times daily as needed for cough.  20 capsule  0  . chlorhexidine (PERIDEX) 0.12 % solution Use as directed 15 mLs in the mouth or throat 2 (two) times daily.       Marland Kitchen CINNAMON PO Take 1 tablet by mouth daily.      . diclofenac sodium (VOLTAREN) 1 % GEL Apply 2 g topically 3 (three) times daily as needed (for pain).      Marland Kitchen diphenoxylate-atropine (LOMOTIL) 2.5-0.025 MG per tablet Take 2 tablets by mouth 4 (four) times daily as needed for diarrhea or loose stools.      . doxazosin (CARDURA) 8 MG tablet Take 4 mg by mouth 2 (two) times daily.      Marland Kitchen gabapentin (NEURONTIN) 300 MG capsule Take 300 mg by mouth every evening.       . iron polysaccharides (NU-IRON) 150 MG capsule Take 1 capsule (150 mg total) by mouth daily.  30 capsule  5  . isosorbide mononitrate (IMDUR) 60 MG 24 hr tablet Take 60 mg by mouth daily.      Marland Kitchen lidocaine-prilocaine (EMLA) cream Apply 1 application topically daily as needed (to port). For port      . metoprolol succinate (TOPROL-XL) 50 MG 24 hr tablet Take 1 tablet (50 mg total) by mouth daily. Take with or immediately following a meal.  90 tablet  4  . Multiple Vitamin (MULTIVITAMIN) tablet Take 1 tablet by mouth every evening.       . mupirocin cream (BACTROBAN) 2 % Apply 1 application topically 2 (two) times daily as needed (for skin irritation around stoma).      . nitroGLYCERIN (NITROSTAT) 0.4 MG SL tablet Place 0.4 mg under the tongue every 5 (five) minutes as needed for chest pain.      . Nutritional Supplements (ENSURE PO) Take 1 Can by mouth daily.      [name] Glycol-Propyl Glycol (SYSTANE OP) Place 1 drop into both eyes daily.       . potassium chloride (K-DUR) 10 MEQ tablet Take 1 tablet (10 mEq total) by mouth daily.  30 tablet  4  . Rivaroxaban (XARELTO) 20 MG TABS tablet Take 20 mg  by mouth daily with breakfast.       . simvastatin (ZOCOR) 40 MG tablet Take 20 mg by mouth every evening.      . sodium chloride (OCEAN) 0.65 % SOLN nasal spray Place 1 spray into both nostrils as needed for congestion.      . torsemide (DEMADEX) 20 MG tablet Take 2 tablets (40 mg total) by mouth 2 (two) times daily. Take  20 to 76m by mouth one time daily as directed by physician.  120 tablet  3  . vitamin E 400 UNIT capsule Take 400 Units by mouth daily.        Results for orders placed during the hospital encounter of 09/21/13 (from the past 48 hour(s))  CBC     Status: Abnormal   Collection Time    09/21/13  3:25 PM      Result Value Ref Range   WBC 8.9  4.0 - 10.5 K/uL   RBC 2.50 (*) 4.22 - 5.81 MIL/uL   Hemoglobin 7.6 (*) 13.0 - 17.0 g/dL   HCT 23.0 (*) 39.0 - 52.0 %   MCV 92.0  78.0 - 100.0 fL   MCH 30.4  26.0 - 34.0 pg   MCHC 33.0  30.0 - 36.0 g/dL   RDW 18.3 (*) 11.5 - 15.5 %   Platelets 213  150 - 400 K/uL  COMPREHENSIVE METABOLIC PANEL     Status: Abnormal   Collection Time    09/21/13  3:25 PM      Result Value Ref Range   Sodium 138  137 - 147 mEq/L   Potassium 4.2  3.7 - 5.3 mEq/L   Chloride 99  96 - 112 mEq/L   CO2 25  19 - 32 mEq/L   Glucose, Bld 186 (*) 70 - 99 mg/dL   BUN 56 (*) 6 - 23 mg/dL   Creatinine, Ser 3.22 (*) 0.50 - 1.35 mg/dL   Calcium 8.8  8.4 - 10.5 mg/dL   Total Protein 6.9  6.0 - 8.3 g/dL   Albumin 2.7 (*) 3.5 - 5.2 g/dL   AST 22  0 - 37 U/L   ALT 8  0 - 53 U/L   Alkaline Phosphatase 42  39 - 117 U/L   Total Bilirubin 0.9  0.3 - 1.2 mg/dL   GFR calc non Af Amer 17 (*) >90 mL/min   GFR calc Af Amer 19 (*) >90 mL/min   Comment: (NOTE)     The eGFR has been calculated using the CKD EPI equation.     This calculation has not been validated in all clinical situations.     eGFR's persistently <90 mL/min signify possible Chronic Kidney     Disease.  PRO B NATRIURETIC PEPTIDE     Status: Abnormal   Collection Time    09/21/13  3:25 PM       Result Value Ref Range   Pro B Natriuretic peptide (BNP) 13493.0 (*) 0 - 450 pg/mL   X-ray Chest Pa And Lateral   09/21/2013   CLINICAL DATA:  Shortness of breath, coughing up blood  EXAM: CHEST  2 VIEW  COMPARISON:  Chest x-ray of 09/12/2013  FINDINGS: There is more opacity particular at the left lung base suspicious for pneumonia. In addition, there is cardiomegaly present with mild pulmonary vascular congestion and at least a small right pleural effusion. These findings may indicate superimposed mild congestive heart failure as well. Right-sided central venous line tip overlies the expected SVC -RA junction. Diffuse degenerative change is noted throughout the thoracic spine.  IMPRESSION: 1. Increased opacity at the bases may represent pneumonia as well as atelectasis. Recommend continued followup. 2. Cardiomegaly with pulmonary vascular congestion and small right effusion. Possible mild CHF.   Electronically Signed   By: PIvar DrapeM.D.   On: 09/21/2013 15:10    ROS: General:recent PNA no fevers, increasing weight Skin:no rashes or ulcers HEENT:no blurred vision,  no congestion CV:see HPI PUL:see HPI DP:OEUMPNTIRWE occ diarrhea and constipation nor melena, no indigestion GU:? Hematuria today, no dysuria MS:no joint pain, no claudication Neuro:no syncope, no lightheadedness, very weak yesterday Endo:+ diabetes, no thyroid disease   Blood pressure 148/69, pulse 71, temperature 98.1 F (36.7 C), temperature source Oral, resp. rate 20, height 5' 9"  (1.753 m), weight 212 lb 8 oz (96.389 kg), SpO2 88.00%. PE: General:Pleasant affect, NAD Skin:Warm and dry, pale, brisk capillary refill HEENT:normocephalic, sclera clear, mucus membranes moist Neck:supple, + JVD, no bruits  Heart:S1S2 RRR with premature beats, with 2/ 6 systolic murmur, no gallup, rub or click, Lt chest wall with portacath Lungs: with rales 1/3 up,  occ rhonchi, no wheezes RXV:QMGQ, non tender, + BS, do not palpate liver spleen  or masses, colostomy stable Ext:2-3 + lower ext edema,  2+ radial pulses Neuro:alert and oriented, MAE, follows commands, + facial symmetry    Assessment/Plan Principal Problem:   Acute on chronic combined systolic and diastolic congestive heart failure Active Problems:   Acute kidney injury   CAD, Ant MI 1990- total LAD with collaterals, Myoview low risk 3/12   Colon cancer metastasized to liver   Acute pulmonary embolism- 04/02/13   Hemoptysis   CHF (congestive heart failure)   Mitral regurgitation, mod to severe 09/2012 echo  PLAN: Need renal consult, repeat echo. Concern stopping Xarelto with PE in  Sept 2014.  IV diuretics will give one dose of IV Lasix and ask renal to manage.  Pro BNP elevated.  Add tele with hx PAF.   MD to see as well.  Pt has been unable to tolerate Iron po.  PCP changing Xarelto to Lovenox.  ? Transfuse.    Echo ordered, previous echo 09/2012 with  Aortic valve: Trileaflet; mildly thickened, mildly calcified leaflets. There was very mild stenosis. Mild to moderate regurgitation. - Mitral valve: Calcified annulus. Moderate to severe regurgitation. Valve area by pressure half-time: 1.93cm^2. PA pressure 51 mmHg,  EF 35-40%.    [place]  Nurse Practitioner Certified [place] Pager [phone] or after 5pm or weekends call [phone] 09/21/2013, 6:07 PM  Patient seen and examined.  Agree with findings of L ingold.  Patienwith known ischemic cardiomyopathy    Recent hospitalization for URI Now with evid of volume overload I am not convinced of active ischemia  Renal function is down from recent admit. WIll give 1 dose of IV lasix and follow response Renal has been consulted.    Dorris Carnes

## 2013-09-21 NOTE — H&P (Signed)
Patient ID: Todd [name], male DOB: 1930-02-23, 80 y.o. MRN: [id number]  [facility] Admission History and Physical  Service Pager: [phone]  Patient name: Todd [name] Medical record number: [id number]  Date of birth: [birth year] Age: 80 y.o. Gender: male  Primary Care Provider: Willeen Niece, MD  Consultants: Cardiology/Heart Failure Team; Nephrology Code Status: Full  Chief Complaint: Volume overload, fatigue/lethargy  Assessment and Plan:  Sandford [name] is a 80 y.o. male presenting with lethargy, worsening dyspnea and increasing serum Cr. He appears volume -overloaded.  #1: CHF: Appears volume overloaded, not responding with increased dose of torsemide since his most recent outpatient Cardiology office visit on Feb 25th. Concern for worsening EF that is contributing to his renal dysfunction as cardio-renal syndrome. For CHF Team consult and input about how best to diurese Mr. Press. Additionally, I wonder if his is a candidate for R heart cath and possible inotrope (milrinone) for symptomatic improvement.  #2: Acute renal failure: Patient's creatinine has continued to increase over the past 1 month; had renal US done at last admission within the past month that was negative for obstructive renal disease. UA shows some blood as well. Concern for renal hypoperfusion in setting of worsening CHF. Will ask for Renal consult to evaluate for other possible causes of medical renal disease.  #3: Anemia; gradual downward trend in Hgb over the past month. On Xarelto, has had intermittent epistaxis and hemoptysis. His cough has improved somewhat in the past 1-2 weeks, although he still has blood in the sputum. No fevers/chills or other signs/sxs of recurrent PNA. CXR ordered (more for evaluation of possible pulm edema than for suspicion of pulmonary infectious process).  FEN/GI: heart-healthy; low-sodium  Prophylaxis: On Xarelto  Disposition:  History of Present Illness: Todd [name] is a 80 y.o. male presenting with progressively worsening lethargy and lower extremity edema. He has been noted to have worsening renal function (baseline Cr @1 .0; most recent Cr 2.92) . PMH is significant for metastatic colon cancer; systolic HF with most recent EF 35-40% on ECHO 10 months ago. Also with history of VTE events; most recently PE diagnosed Sept 2014, has been maintained on Xarelto since that time in setting of recurrent VTEs and malignancy.  Review Of Systems: Per HPI with the following additions: Negative for fevers/chills, no chest pain, dyspnea improved since recent initiation of oxygen 2L/min nasal canula; no N/V, has had poor appetite. No blood per ostomy. Has had some diarrhea yesterday. Recently started iron tablets for anemia which has made his stool dark (temporally related to the iron). Worsening bilat ankle swelling. Generalized weakness.  Otherwise 12 point review of systems was performed and was unremarkable.  Patient Active Problem List    Diagnosis  Date Noted   .  CHF (congestive heart failure)  09/21/2013   .  Normocytic anemia  09/19/2013   .  Hypoxemia  09/19/2013   .  Hemoptysis  09/12/2013   .  Chronic pulmonary embolism  09/04/2013   .  Acute kidney injury  09/04/2013   .  HCAP (healthcare-associated pneumonia)  09/02/2013   .  Dizziness and giddiness  08/03/2013   .  Heel pain  07/27/2013   .  Essential hypertension  05/21/2013   .  Skin irritation  04/17/2013   .  Protein-calorie malnutrition, severe  04/02/2013   .  Acute pulmonary embolism- 04/02/13  04/02/2013   .  Acute on chronic combined systolic and diastolic congestive heart failure  04/02/2013   .  Diastolic dysfunction- grade 24 September 2012  04/02/2013   .  Dyspnea  03/09/2013   .  Myocardial infarction'1995    .  Arthritis, rheumatoid    .  BPH (benign prostatic hyperplasia)    .  Neuropathy    .  Epistaxis  01/02/2013   .  Onychomycosis  01/02/2013   .  Right knee pain  12/25/2012   .   Colon cancer metastasized to liver  09/19/2012   .  CAD, Ant MI 1990- total LAD with collaterals, Myoview low risk 3/12  09/06/2012   .  Heart murmur  09/06/2012   .  Nausea with vomiting and diarrhea  09/05/2012   .  Elevated lactic acid level  09/05/2012   .  Elevated lipase  09/05/2012   .  Leukocytosis, unspecified  09/05/2012   .  Prostate enlargement  09/05/2012   .  Seborrheic keratosis  07/07/2012   .  Hip pain, right  08/10/2011   .  Right elbow pain  05/26/2011   .  Tinea pedis  10/23/2010   .  HYPERTENSION  07/22/2009   .  PROSTATE SPECIFIC ANTIGEN, ELEVATED  07/22/2009   .  DIABETES MELLITUS, CONTROLLED, WITHOUT COMPLICATIONS  27/78/2423   .  BACK PAIN, LUMBAR  03/25/2009   .  COUGH  09/09/2008   .  Olecranon bursitis  11/16/2007   .  ROTATOR CUFF INJURY, RIGHT SHOULDER  05/04/2007   .  HYPERLIPIDEMIA  09/22/2006   .  Gout, unspecified  09/22/2006   .  OBESITY, NOS  09/22/2006   .  Ischemic cardiomyopathy, EF 35-40% by echo March 2014  09/22/2006   .  Hyperplasia of prostate  09/22/2006   .  OSTEOARTHRITIS OF SPINE, NOS  09/22/2006    Past Medical History:  Past Medical History   Diagnosis  Date   .  Myocardial infarction  1990     "Massive"- total LAD with collaterals   .  Gout    .  Hypertension    .  Arthritis, rheumatoid    .  Diabetes mellitus without complication    .  CHF (congestive heart failure)    .  BPH (benign prostatic hyperplasia)    .  Neuropathy    .  Hyperlipidemia    .  Ischemic cardiomyopathy  March 2012     EF 31% Myoview. Pt declines ICD   .  Colon cancer  2014     Surg, chemo March 2014    Past Surgical History:  Past Surgical History   Procedure  Laterality  Date   .  Cataract extraction     .  Flexible sigmoidoscopy  N/A  09/05/2012     Procedure: FLEXIBLE SIGMOIDOSCOPY; Surgeon: Missy Sabins, MD; Location: [facility]; Service: Endoscopy; Laterality: N/A; Give 1000 cc tapwater enema on call to endoscopy.   .  Colon resection   N/A  09/07/2012     Procedure: LAPAROSCOPIC ASSISTED SIGMOID COLON RESECTION END OSTOMY; Surgeon: Shann Medal, MD; Location: [place] [facility]; Service: General; Laterality: N/A;   .  Portacath placement  N/A  09/07/2012     Procedure: Placement of power port; Surgeon: Shann Medal, MD; Location: [facility]; Service: General; Laterality: N/A;    Social History:  History   Substance Use Topics   .  Smoking status:  Former Smoker -- 1.00 packs/day     Quit date:  04/01/2009   .  Smokeless tobacco:  Never Used  Comment: Quit 45 years ago.   .  Alcohol Use:  No    Additional social history: Nonsmoker. Here today with his wife and daughter, both very involved in his care.  Please also refer to relevant sections of EMR.  Family History:  Family History   Problem  Relation  Age of Onset   .  Stroke  Father    .  Diabetes  Father    .  Hypertension  Father    .  Heart attack  Father    .  Hyperlipidemia  Neg Hx    .  Sudden death  Neg Hx     Allergies and Medications:  No Known Allergies  Current Outpatient Prescriptions on File Prior to Visit   Medication  Sig  Dispense  Refill   .  aspirin EC 81 MG tablet  Take 81 mg by mouth daily.     .  benzonatate (TESSALON) 100 MG capsule  Take 1 capsule (100 mg total) by mouth 2 (two) times daily as needed for cough.  20 capsule  0   .  chlorhexidine (PERIDEX) 0.12 % solution  Use as directed 15 mLs in the mouth or throat 2 (two) times daily as needed (for mouth irritation).     Marland Kitchen  diclofenac sodium (VOLTAREN) 1 % GEL  Apply 2 g topically 3 (three) times daily as needed (for pain).     Marland Kitchen  diphenoxylate-atropine (LOMOTIL) 2.5-0.025 MG per tablet  Take 2 tablets by mouth 4 (four) times daily as needed for diarrhea or loose stools.     .  doxazosin (CARDURA) 8 MG tablet  Take 4 mg by mouth 2 (two) times daily.     Marland Kitchen  gabapentin (NEURONTIN) 300 MG capsule  Take 300 mg by mouth daily.     .  iron polysaccharides (NU-IRON) 150 MG capsule  Take 1 capsule (150 mg  total) by mouth daily.  30 capsule  5   .  isosorbide mononitrate (IMDUR) 60 MG 24 hr tablet  Take 60 mg by mouth daily.     Marland Kitchen  lidocaine-prilocaine (EMLA) cream  Apply 1 application topically as needed (to port).     .  metoprolol succinate (TOPROL-XL) 50 MG 24 hr tablet  Take 1 tablet (50 mg total) by mouth daily. Take with or immediately following a meal.  90 tablet  4   .  Multiple Vitamin (MULTIVITAMIN) tablet  Take 1 tablet by mouth daily.     .  mupirocin cream (BACTROBAN) 2 %  Apply 1 application topically 2 (two) times daily as needed (for skin irritation around stoma).     .  nitroGLYCERIN (NITROSTAT) 0.4 MG SL tablet  Place 0.4 mg under the tongue every 5 (five) minutes as needed for chest pain.     .  Nutritional Supplements (ENSURE PO)  Take 1 Can by mouth daily.     [name] Glycol-Propyl Glycol (SYSTANE OP)  Place 1 drop into both eyes daily.     .  potassium chloride (K-DUR) 10 MEQ tablet  Take 1 tablet (10 mEq total) by mouth daily.  30 tablet  4   .  Rivaroxaban (XARELTO) 20 MG TABS tablet  Take 20 mg by mouth daily with breakfast.     .  simvastatin (ZOCOR) 40 MG tablet  Take 20 mg by mouth every evening.     .  sodium chloride (OCEAN) 0.65 % SOLN nasal spray  Place 1 spray into both  nostrils as needed for congestion.     .  torsemide (DEMADEX) 20 MG tablet  Take 2 tablets (40 mg total) by mouth 2 (two) times daily. Take 20 to 40mg  by mouth one time daily as directed by physician.  120 tablet  3   .  vitamin E 400 UNIT capsule  Take 400 Units by mouth daily.      No current facility-administered medications on file prior to visit.    Objective:  BP 137/83  Pulse 80  Ht 5\' 9"  (1.753 m)  Wt 213 lb (96.616 kg)  BMI 31.44 kg/m2  SpO2 100%  Exam:  General: noticeable pallor, speaking fluidly in no distress.  HEENT: Notable conjunctival pallor. PERRL. Mildly dry mucus membranes. Neck supple. No adenopathy.  Cardiovascular: Regular O2D7, notable systolic murmur   Respiratory: good air movement; no rales or wheezes.  Abdomen: soft; stoma site clean. Nontender.  Extremities: 3+ bilat lower extremity edema with pitting.  Skin: dry, warm.  Neuro: able to get to exam table with 1-point assist.  Labs and Imaging:  CBC  BMET    Recent Labs    Recent Labs      Lab  09/19/13 1337      NA  136      K  4.5      CL  101    Lab  09/19/13 1337   CO2  26    WBC  8.6   BUN  52*    HGB  8.1*   CREATININE  2.92*    HCT  24.0*   GLUCOSE  109*    PLT  259   CALCIUM  8.7         Called to resident team and attending on FMTS to discuss admission plans for direct admit for telemetry admission.  Willeen Niece, MD  09/21/2013, 12:25 PM  PGY-attending, [place] Intern pager: [phone], text pages welcome

## 2013-09-22 DIAGNOSIS — I5043 Acute on chronic combined systolic (congestive) and diastolic (congestive) heart failure: Secondary | ICD-10-CM

## 2013-09-22 DIAGNOSIS — I059 Rheumatic mitral valve disease, unspecified: Secondary | ICD-10-CM

## 2013-09-22 LAB — CBC
HEMATOCRIT: 22.7 % — AB (ref 39.0–52.0)
Hemoglobin: 7.5 g/dL — ABNORMAL LOW (ref 13.0–17.0)
MCH: 30.1 pg (ref 26.0–34.0)
MCHC: 33 g/dL (ref 30.0–36.0)
MCV: 91.2 fL (ref 78.0–100.0)
PLATELETS: 215 10*3/uL (ref 150–400)
RBC: 2.49 MIL/uL — AB (ref 4.22–5.81)
RDW: 18.3 % — AB (ref 11.5–15.5)
WBC: 9.4 10*3/uL (ref 4.0–10.5)

## 2013-09-22 LAB — BASIC METABOLIC PANEL
BUN: 59 mg/dL — AB (ref 6–23)
CALCIUM: 8.8 mg/dL (ref 8.4–10.5)
CHLORIDE: 98 meq/L (ref 96–112)
CO2: 25 meq/L (ref 19–32)
CREATININE: 3.59 mg/dL — AB (ref 0.50–1.35)
GFR calc Af Amer: 17 mL/min — ABNORMAL LOW (ref >90)
GFR calc non Af Amer: 15 mL/min — ABNORMAL LOW (ref >90)
Glucose, Bld: 101 mg/dL — ABNORMAL HIGH (ref 70–99)
Potassium: 4.3 mEq/L (ref 3.7–5.3)
Sodium: 136 mEq/L — ABNORMAL LOW (ref 137–147)

## 2013-09-22 MED ORDER — SODIUM CHLORIDE 0.9 % IV SOLN
125.0000 mg | INTRAVENOUS | Status: DC
  Administered 2013-09-22 – 2013-09-25 (×2): 125 mg via INTRAVENOUS
  Filled 2013-09-22 (×3): qty 10

## 2013-09-22 MED ORDER — DEXTROSE 5 % IV SOLN
120.0000 mg | Freq: Four times a day (QID) | INTRAVENOUS | Status: DC
  Administered 2013-09-22 – 2013-09-24 (×9): 120 mg via INTRAVENOUS
  Filled 2013-09-22 (×13): qty 12

## 2013-09-22 MED ORDER — ZOLPIDEM TARTRATE 5 MG PO TABS: 5.0000 mg | ORAL_TABLET | Freq: Every evening | ORAL | Status: DC | PRN

## 2013-09-22 NOTE — Progress Notes (Signed)
Family Medicine Teaching Service Daily Progress Note Intern Pager: [phone]  Patient name: Todd [name] Medical record number: [id number] Date of birth: May 04, 1930 Age: 80 y.o. Gender: male  Primary Care Provider: Willeen Niece, MD Consultants: cardiology and nephrology Code Status: full  Pt Overview and Major Events to Date:   Assessment and Plan: Olander Friedl is a 80 y.o. male presenting with lethargy, worsening dyspnea and increasing serum Cr. He appears volume -overloaded.   1. CHF exacerbation. Will f/u Nephrology recs in order to maximize diuresis and avoid dialysis. ECHO pending and appreciate cards recs. Minus 660 ml this am. On furosemide have received 100mg  wil continue to monitor diuresis   2. Recent Hx of PE in the setting of metastatic colon cancer: pt was on Xarelto, due to kidney function this was discontinued and placed on Lovenox.   3. AKI: concern for cardio-renal syndrome and also the use of ACEi and NSAIDs. Nephrology consulted and will f/u recs as explained above.   4. Anemia: IV iron, will trend Hb and if critical value or symptomatic will consider transfusion. Will obtain FOBT.  5.Recent PNA trated with abx and pt finished course. No fever, normal WBC, occasional cough and has had intermittent hemoptysis and epistasis while on anticoagulation. Last night small hemoptysis that did not repeat. . Findings on CXR can be residual for prior process or his pulmonary congestion. Will monitor clinically and repeat CXR or start abx if needed.  -continue home oxygen   5. Metastatic colon cancer: no obstruction on imaging. Colostomy patent.  Other chronic conditions: HTN: controlled on metoprolol and now on furosemide HLD: on simvastatin 20   FEN/GI: HH diet PPx: Lovenox  Disposition: pending clinical improvement of fluid overload.  Subjective: feeling better this am. Last night had hard time falling asleep but Ambien prescribed seemed to help. Continues  afebrile  Objective: Temp:  [97.9 F (36.6 C)-98.5 F (36.9 C)] 97.9 F (36.6 C) (02/28 0539) Pulse Rate:  [67-80] 75 (02/28 0539) Resp:  [20] 20 (02/28 0539) BP: (133-148)/(63-83) 133/63 mmHg (02/28 0539) SpO2:  [88 %-100 %] 95 % (02/28 0539) Weight:  [210 lb 12.2 oz (95.6 kg)-213 lb (96.616 kg)] 210 lb 12.2 oz (95.6 kg) (02/28 0539) Physical Exam: Gen: Pleasant and conversational pt, NAD. Pt able to speak in full sentences without evidence of SOB  HEENT: pale and moist mucous membranes. Neck supple. JVD present . No adenopathies.  CV: Regular rate and rhythm. II-VI systolic murmur. No gallops.  PULM: diminished breath sounds throughout. Bibasal rales present.  ABD: Soft, non tender, normal bowel sounds. Colostomy appears patent.  EXT: +3 pitting edema up to  Neuro: Alert and oriented x3. No focalization  Psych: Normal mood and affect. Normal speech. Normal though process.   Laboratory:  Recent Labs Lab 09/19/13 1337 09/21/13 1525  WBC 8.6 8.9  HGB 8.1* 7.6*  HCT 24.0* 23.0*  PLT 259 213    Recent Labs Lab 09/19/13 1337 09/21/13 1525  NA 136 138  K 4.5 4.2  CL 101 99  CO2 26 25  BUN 52* 56*  CREATININE 2.92* 3.22*  CALCIUM 8.7 8.8  PROT  --  6.9  BILITOT  --  0.9  ALKPHOS  --  42  ALT  --  8  AST  --  22  GLUCOSE 109* 186*   Imaging/Diagnostic Tests: CXR 2/27 1. Increased opacity at the bases may represent pneumonia as well as  atelectasis. Recommend continued followup.  2. Cardiomegaly with pulmonary vascular congestion and  small right  effusion. Possible mild CHF   Laraya Pestka Piloto de Gwendalyn Ege, MD 09/22/2013, 6:34 AM PGY-3, [facility] Intern pager: [phone], text pages welcome

## 2013-09-22 NOTE — Progress Notes (Signed)
[facility] ROUNDING NOTE   Subjective:   Interval History: diuresing well. Estimate about 10 lbs of edema   Objective:  Vital signs in last 24 hours:  Temp:  [97.9 F (36.6 C)-98.5 F (36.9 C)] 97.9 F (36.6 C) (02/28 0539) Pulse Rate:  [67-80] 78 (02/28 0945) Resp:  [20] 20 (02/28 0539) BP: (126-148)/(63-88) 126/88 mmHg (02/28 0945) SpO2:  [88 %-100 %] 95 % (02/28 0539) Weight:  [95.6 kg (210 lb 12.2 oz)-96.616 kg (213 lb)] 95.6 kg (210 lb 12.2 oz) (02/28 0539)  Weight change:  Filed Weights   09/21/13 1414 09/22/13 0539  Weight: 96.389 kg (212 lb 8 oz) 95.6 kg (210 lb 12.2 oz)    Intake/Output: I/O last 3 completed shifts: In: 240 [P.O.:240] Out: 900 [Urine:900]   Intake/Output this shift:  Total I/O In: 240 [P.O.:240] Out: 350 [OIZTI:458]  CVS- RRR systolic murmur RS- CTA diminshed at bases ABD- BS present soft non-distended EXT- 3+ edema   Basic Metabolic Panel:  Recent Labs Lab 09/19/13 1337 09/21/13 1525 09/22/13 0555  NA 136 138 136*  K 4.5 4.2 4.3  CL 101 99 98  CO2 26 25 25   GLUCOSE 109* 186* 101*  BUN 52* 56* 59*  CREATININE 2.92* 3.22* 3.59*  CALCIUM 8.7 8.8 8.8    Liver Function Tests:  Recent Labs Lab 09/21/13 1525  AST 22  ALT 8  ALKPHOS 42  BILITOT 0.9  PROT 6.9  ALBUMIN 2.7*   No results found for this basename: LIPASE, AMYLASE,  in the last 168 hours No results found for this basename: AMMONIA,  in the last 168 hours  CBC:  Recent Labs Lab 09/19/13 1337 09/21/13 1525 09/22/13 0555  WBC 8.6 8.9 9.4  HGB 8.1* 7.6* 7.5*  HCT 24.0* 23.0* 22.7*  MCV 87.3 92.0 91.2  PLT 259 213 215    Cardiac Enzymes: No results found for this basename: CKTOTAL, CKMB, CKMBINDEX, TROPONINI,  in the last 168 hours  BNP: No components found with this basename: POCBNP,   CBG:  Recent Labs Lab 09/21/13 2217  GLUCAP 112*    Microbiology: Results for orders placed in visit on 09/14/13  URINE CULTURE     Status:  None   Collection Time    09/14/13 12:37 PM      Result Value Ref Range Status   Colony Count NO GROWTH   Final   Organism ID, Bacteria NO GROWTH   Final    Coagulation Studies: No results found for this basename: LABPROT, INR,  in the last 72 hours  Urinalysis:  Recent Labs  09/21/13 1826  COLORURINE AMBER*  LABSPEC 1.015  PHURINE 5.5  GLUCOSEU NEGATIVE  HGBUR LARGE*  BILIRUBINUR NEGATIVE  KETONESUR NEGATIVE  PROTEINUR 100*  UROBILINOGEN 0.2  NITRITE NEGATIVE  LEUKOCYTESUR NEGATIVE      Imaging: X-ray Chest Pa And Lateral   09/21/2013   CLINICAL DATA:  Shortness of breath, coughing up blood  EXAM: CHEST  2 VIEW  COMPARISON:  Chest x-ray of 09/12/2013  FINDINGS: There is more opacity particular at the left lung base suspicious for pneumonia. In addition, there is cardiomegaly present with mild pulmonary vascular congestion and at least a small right pleural effusion. These findings may indicate superimposed mild congestive heart failure as well. Right-sided central venous line tip overlies the expected SVC -RA junction. Diffuse degenerative change is noted throughout the thoracic spine.  IMPRESSION: 1. Increased opacity at the bases may represent pneumonia as well as atelectasis. Recommend continued  followup. 2. Cardiomegaly with pulmonary vascular congestion and small right effusion. Possible mild CHF.   Electronically Signed   By: Ivar Drape M.D.   On: 09/21/2013 15:10     Medications:     . aspirin EC  81 mg Oral Daily  . doxazosin  4 mg Oral BID  . enoxaparin (LOVENOX) injection  100 mg Subcutaneous Q24H  . ferric gluconate (FERRLECIT/NULECIT) IV  125 mg Intravenous Q T,Th,Sa-HD  . furosemide  120 mg Intravenous Q6H  . gabapentin  300 mg Oral Daily  . isosorbide mononitrate  60 mg Oral Daily  . metoprolol succinate  50 mg Oral Daily  . simvastatin  20 mg Oral QPM  . sodium chloride  3 mL Intravenous Q12H   sodium chloride, benzonatate, sodium chloride, sodium  chloride, zolpidem  Assessment/ Plan:  Todd [name] is a 80 y.o. male presenting with lethargy, worsening dyspnea and increasing serum Cr. He appears volume -overloaded  1. Acute renal failure appears to be in the setting of ACE Benazepril and use of voltaren NSAIDS. Clear that he is not wishing to pursue dialysis and I would not recommend this either. Spoke with daughter and she is interested in palliative care. This approach seems the most reasonable with limits set in terms of treatment.  2. HTN/volume Appears markedly edematous and will diurese patient  3. Anemia iron deficiency will give IV iron  4. CHF Continue IV diuresis  5. Metastatic Colon cancer No obstruction seen on recent U/S  Great diuresis and will continue on IV lasix appears symptomatically improved   LOS: 1 Naheem Mosco W @TODAY @10 :18 AM

## 2013-09-22 NOTE — Progress Notes (Signed)
FMTS ATTENDING  NOTE Todd Norlander,MD I  have seen and examined this patient, reviewed their chart. I have discussed this patient with the resident. I agree with the resident's findings, assessment and care plan.  Patient feels much better this morning, he denies any SOB, his leg swelling has improved a lot and he is happy about it, he denies any weakness.He is a little constipated and concern this might worsen from the iron he is receiving.  Filed Vitals:   09/21/13 1414 09/21/13 2045 09/22/13 0539  BP: 148/69 143/75 133/63  Pulse: 71 67 75  Temp: 98.1 F (36.7 C) 98.5 F (36.9 C) 97.9 F (36.6 C)  TempSrc: Oral Oral Oral  Resp: 20 20 20   Height: 5\' 9"  (1.753 m)    Weight: 212 lb 8 oz (96.389 kg)  210 lb 12.2 oz (95.6 kg)  SpO2: 88% 98% 95%    Exam: Gen: Seated upright in bed ready for breakfast. Not in any distress. Does not appear lethargic,not dehydrated. Resp: Air entry equal and clear B/L. Heart: S1 S2 normal with mild systolic murmur. Abd: Soft, NT/ND, +BS with colostomy bag on his left lower abdomen with 3 pellets of stool in them, not bloody. Ext: ++ pedal swelling.  A/P: 80 y/o male with 1. CHF exacerbation: Improving s/p diuresis.     Continue Lasix IV for now.     Monitor I&Os and daily weighing.     F/U ECHO report.     Cardiology on board with his care.     BP optimization.  2. AKI on CKD: Creatinine level increased s/p IV lasix.     Appreciate Nephrology consult.     Unfortunately heis fluid over loads,hence need for lasix at this time.    We will watch for renal function improvement.  3. Anemia: Seem chronic for him.     ?? GI loss in patient with hx of colon cancer.    It could also be nutritional issue with poor iron absorption.   Currently receiving IV iron replacement.    FOBT to r/o GI bleeding.   If positive might need to hold anticoagulant.  4. Hx of PE: On levonox.     If persistent hemoptysis of + FOBT may need to hold anticoagulant  altogether.  5. Constipation:Miralax recommended but he declined, he prefer to take fruit or prune berry juice.     He is aware he can request this from the nutritionist.  6.Colon CA stable.

## 2013-09-22 NOTE — Progress Notes (Signed)
Pharmacist Heart Failure Core Measure Documentation  Assessment: Todd [name] has an EF documented as 35-40% on 09/22/13 by ECHO.  Rationale: Heart failure patients with left ventricular systolic dysfunction (LVSD) and an EF < 40% should be prescribed an angiotensin converting enzyme inhibitor (ACEI) or angiotensin receptor blocker (ARB) at discharge unless a contraindication is documented in the medical record.  This patient is not currently on an ACEI or ARB for HF.  This note is being placed in the record in order to provide documentation that a contraindication to the use of these agents is present for this encounter.  ACE Inhibitor or Angiotensin Receptor Blocker is contraindicated (specify all that apply)  []   ACEI allergy AND ARB allergy []   Angioedema []   Moderate or severe aortic stenosis []   Hyperkalemia []   Hypotension []   Renal artery stenosis [x]   Worsening renal function, preexisting renal disease or dysfunction   [name], [name] 09/22/2013 3:48 PM

## 2013-09-22 NOTE — Progress Notes (Signed)
Pt with ferric gluconate order to be given with HD.  Not sent as scheduled by pharmacy .Verified with Dr. Justin Mend that order is not for HD and med can be given to pt on the floor.  Pharmacy informed and will send a dose.  Karie Kirks, Therapist, sports.

## 2013-09-22 NOTE — Progress Notes (Signed)
Patient asked RN to come into room because he was having a hard time sleeping and did not know why. RN assessed patient to see what she could do and he stated to her that he felt like he was having just a little hard time breathing. RN hooked patient up to the vital sign machine to see what his O2 stat would be-it was 98%. Will continue to monitor

## 2013-09-22 NOTE — Progress Notes (Signed)
Patient asked for something to help him sleep. RN is paging family practice on call MD now. Awaiting new orders

## 2013-09-22 NOTE — Progress Notes (Signed)
  Echocardiogram 2D Echocardiogram has been performed.  Mauricio Po 09/22/2013, 9:45 AM

## 2013-09-22 NOTE — Progress Notes (Signed)
    Primary cardiologist: Dr. Quay Burow  Subjective:    No worsening shortness of breath, no chest pain.  Objective:   Temp:  [97.9 F (36.6 C)-98.5 F (36.9 C)] 97.9 F (36.6 C) (02/28 0539) Pulse Rate:  [67-80] 75 (02/28 0539) Resp:  [20] 20 (02/28 0539) BP: (133-148)/(63-83) 133/63 mmHg (02/28 0539) SpO2:  [88 %-100 %] 95 % (02/28 0539) Weight:  [210 lb 12.2 oz (95.6 kg)-213 lb (96.616 kg)] 210 lb 12.2 oz (95.6 kg) (02/28 0539) Last BM Date: 09/20/13  Filed Weights   09/21/13 1414 09/22/13 0539  Weight: 212 lb 8 oz (96.389 kg) 210 lb 12.2 oz (95.6 kg)    Intake/Output Summary (Last 24 hours) at 09/22/13 0932 Last data filed at 09/22/13 3557  Gross per 24 hour  Intake    240 ml  Output   1100 ml  Net   -860 ml    Exam:  General: Overweight male, no distress.  Lungs: Decreased breath sounds with congestion at bases.  Cardiac: RRR, 2/6 apical and basal systolic murmurs, no gallop.  Extremities: 2-3+ edema.  Lab Results:  Basic Metabolic Panel:  Recent Labs Lab 09/19/13 1337 09/21/13 1525 09/22/13 0555  NA 136 138 136*  K 4.5 4.2 4.3  CL 101 99 98  CO2 26 25 25   GLUCOSE 109* 186* 101*  BUN 52* 56* 59*  CREATININE 2.92* 3.22* 3.59*  CALCIUM 8.7 8.8 8.8    Liver Function Tests:  Recent Labs Lab 09/21/13 1525  AST 22  ALT 8  ALKPHOS 42  BILITOT 0.9  PROT 6.9  ALBUMIN 2.7*    CBC:  Recent Labs Lab 09/19/13 1337 09/21/13 1525 09/22/13 0555  WBC 8.6 8.9 9.4  HGB 8.1* 7.6* 7.5*  HCT 24.0* 23.0* 22.7*  MCV 87.3 92.0 91.2  PLT 259 213 215    BNP:  Recent Labs  08/31/13 1758 09/02/13 1215 09/21/13 1525  PROBNP 3523.0* 9281.0* 13493.0*    ECG: Sinus rhythm with poor with progression and PVCs.   Medications:   Scheduled Medications: . aspirin EC  81 mg Oral Daily  . doxazosin  4 mg Oral BID  . enoxaparin (LOVENOX) injection  100 mg Subcutaneous Q24H  . ferric gluconate (FERRLECIT/NULECIT) IV  125 mg Intravenous Q  T,Th,Sa-HD  . furosemide  120 mg Intravenous Q6H  . gabapentin  300 mg Oral Daily  . isosorbide mononitrate  60 mg Oral Daily  . metoprolol succinate  50 mg Oral Daily  . simvastatin  20 mg Oral QPM  . sodium chloride  3 mL Intravenous Q12H     Infusions:     PRN Medications:  sodium chloride, benzonatate, sodium chloride, sodium chloride, zolpidem   Assessment:   1. Acute on chronic combined heart failure with continued evidence of volume overload.  2. Known history of moderate to severe mitral regurgitation, last assessed March 2014.  3. ischemic cardiomyopathy, LVEF 35-40% with wall motion abnormalities.  4. Acute renal failure with history of CKD, being managed now by the Nephrology team.  5. history of metastatic colon cancer.  6. History of pulmonary embolus, on Lovenox.   Plan/Discussion:    Current cardiac regimen includes aspirin, Lovenox, Imdur, Toprol-XL, and Zocor. He has been placed on standing high-dose IV Lasix by the Nephrology team for more aggressive diuresis. Plan to follow up on echocardiogram pending for today.   Satira Sark, M.D., F.A.C.C.

## 2013-09-23 LAB — URINALYSIS, ROUTINE W REFLEX MICROSCOPIC
BILIRUBIN URINE: NEGATIVE
GLUCOSE, UA: NEGATIVE mg/dL
KETONES UR: NEGATIVE mg/dL
Leukocytes, UA: NEGATIVE
Nitrite: NEGATIVE
PH: 6 (ref 5.0–8.0)
Protein, ur: NEGATIVE mg/dL
SPECIFIC GRAVITY, URINE: 1.009 (ref 1.005–1.030)
Urobilinogen, UA: 0.2 mg/dL (ref 0.0–1.0)

## 2013-09-23 LAB — BASIC METABOLIC PANEL
BUN: 69 mg/dL — AB (ref 6–23)
BUN: 70 mg/dL — ABNORMAL HIGH (ref 6–23)
CHLORIDE: 99 meq/L (ref 96–112)
CHLORIDE: 98 meq/L (ref 96–112)
CO2: 25 mEq/L (ref 19–32)
CO2: 26 meq/L (ref 19–32)
CREATININE: 4.21 mg/dL — AB (ref 0.50–1.35)
CREATININE: 4.32 mg/dL — AB (ref 0.50–1.35)
Calcium: 8.7 mg/dL (ref 8.4–10.5)
Calcium: 8.8 mg/dL (ref 8.4–10.5)
GFR calc Af Amer: 14 mL/min — ABNORMAL LOW (ref >90)
GFR calc non Af Amer: 12 mL/min — ABNORMAL LOW (ref >90)
GFR calc non Af Amer: 12 mL/min — ABNORMAL LOW (ref >90)
GFR, EST AFRICAN AMERICAN: 14 mL/min — AB (ref >90)
Glucose, Bld: 112 mg/dL — ABNORMAL HIGH (ref 70–99)
Glucose, Bld: 123 mg/dL — ABNORMAL HIGH (ref 70–99)
POTASSIUM: 3.8 meq/L (ref 3.7–5.3)
Potassium: 4 mEq/L (ref 3.7–5.3)
Sodium: 140 mEq/L (ref 137–147)
Sodium: 137 mEq/L (ref 137–147)

## 2013-09-23 LAB — URINE MICROSCOPIC-ADD ON

## 2013-09-23 LAB — MAGNESIUM: Magnesium: 2.1 mg/dL (ref 1.5–2.5)

## 2013-09-23 NOTE — Progress Notes (Signed)
FMTS ATTENDING  NOTE Todd Pittinger,MD I  have seen and examined this patient, reviewed their chart. I have discussed this patient with the resident. I agree with the resident's findings, assessment and care plan. 

## 2013-09-23 NOTE — Progress Notes (Signed)
[name] ROUNDING NOTE   Subjective:   Interval History: diuresing well  Weight loss noted 347 - 425  Complicated picture  ATN    ACE /NSAIDS AIN     Antibiotics AGN   Immune or Pauci immune  Great diuresis and will continue on IV lasix appears symptomatically improved. Will send another urine. Creatinine does seem to increase with diuresis. Blood and protein noted on initial urinalysis and I shall send another. Could this represented an acute GN is always possible considering normal renal function in January. He was admitted with pneumonia in February although this resolved and was treated with antibiotics at that time. He also had nose bleeds that he had in February related to [facility] for history of pulmonary embolus.  Objective:  Vital signs in last 24 hours:  Temp:  [97.6 F (36.4 C)-98.2 F (36.8 C)] 97.6 F (36.4 C) (03/01 0421) Pulse Rate:  [69-82] 69 (03/01 0421) Resp:  [20] 20 (03/01 0421) BP: (120-133)/(65-78) 120/65 mmHg (02/28 2217) SpO2:  [95 %-98 %] 95 % (03/01 0421) Weight:  [93.7 kg (206 lb 9.1 oz)] 93.7 kg (206 lb 9.1 oz) (03/01 0421)  Weight change: -2.689 kg (-5 lb 14.9 oz) Filed Weights   09/21/13 1414 09/22/13 0539 09/23/13 0421  Weight: 96.389 kg (212 lb 8 oz) 95.6 kg (210 lb 12.2 oz) 93.7 kg (206 lb 9.1 oz)    Intake/Output: I/O last 3 completed shifts: In: 542 [P.O.:480; IV Piggyback:62] Out: 9563 [Urine:3260]   Intake/Output this shift:     CVS- RRR RS- CTA ABD- BS present soft non-distended EXT- 2+  edema   Basic Metabolic Panel:  Recent Labs Lab 09/19/13 1337 09/21/13 1525 09/22/13 0555 09/23/13 0540  NA 136 138 136* 140  K 4.5 4.2 4.3 4.0  CL 101 99 98 99  CO2 26 25 25 25   GLUCOSE 109* 186* 101* 112*  BUN 52* 56* 59* 69*  CREATININE 2.92* 3.22* 3.59* 4.21*  CALCIUM 8.7 8.8 8.8 8.7    Liver Function Tests:  Recent Labs Lab 09/21/13 1525  AST 22  ALT 8  ALKPHOS 42  BILITOT 0.9  PROT 6.9  ALBUMIN 2.7*    No results found for this basename: LIPASE, AMYLASE,  in the last 168 hours No results found for this basename: AMMONIA,  in the last 168 hours  CBC:  Recent Labs Lab 09/19/13 1337 09/21/13 1525 09/22/13 0555  WBC 8.6 8.9 9.4  HGB 8.1* 7.6* 7.5*  HCT 24.0* 23.0* 22.7*  MCV 87.3 92.0 91.2  PLT 259 213 215    Cardiac Enzymes: No results found for this basename: CKTOTAL, CKMB, CKMBINDEX, TROPONINI,  in the last 168 hours  BNP: No components found with this basename: POCBNP,   CBG:  Recent Labs Lab 09/21/13 2217  GLUCAP 112*    Microbiology: Results for orders placed in visit on 09/14/13  URINE CULTURE     Status: None   Collection Time    09/14/13 12:37 PM      Result Value Ref Range Status   Colony Count NO GROWTH   Final   Organism ID, Bacteria NO GROWTH   Final    Coagulation Studies: No results found for this basename: LABPROT, INR,  in the last 72 hours  Urinalysis:  Recent Labs  09/21/13 1826  COLORURINE AMBER*  LABSPEC 1.015  PHURINE 5.5  GLUCOSEU NEGATIVE  HGBUR LARGE*  BILIRUBINUR NEGATIVE  KETONESUR NEGATIVE  PROTEINUR 100*  UROBILINOGEN 0.2  NITRITE NEGATIVE  LEUKOCYTESUR  NEGATIVE      Imaging: X-ray Chest Pa And Lateral   09/21/2013   CLINICAL DATA:  Shortness of breath, coughing up blood  EXAM: CHEST  2 VIEW  COMPARISON:  Chest x-ray of 09/12/2013  FINDINGS: There is more opacity particular at the left lung base suspicious for pneumonia. In addition, there is cardiomegaly present with mild pulmonary vascular congestion and at least a small right pleural effusion. These findings may indicate superimposed mild congestive heart failure as well. Right-sided central venous line tip overlies the expected SVC -RA junction. Diffuse degenerative change is noted throughout the thoracic spine.  IMPRESSION: 1. Increased opacity at the bases may represent pneumonia as well as atelectasis. Recommend continued followup. 2. Cardiomegaly with pulmonary  vascular congestion and small right effusion. Possible mild CHF.   Electronically Signed   By: Ivar Drape M.D.   On: 09/21/2013 15:10     Medications:     . aspirin EC  81 mg Oral Daily  . doxazosin  4 mg Oral BID  . enoxaparin (LOVENOX) injection  100 mg Subcutaneous Q24H  . ferric gluconate (FERRLECIT/NULECIT) IV  125 mg Intravenous Once per day on Tue Thu Sat  . furosemide  120 mg Intravenous Q6H  . gabapentin  300 mg Oral Daily  . isosorbide mononitrate  60 mg Oral Daily  . metoprolol succinate  50 mg Oral Daily  . simvastatin  20 mg Oral QPM  . sodium chloride  3 mL Intravenous Q12H   sodium chloride, benzonatate, sodium chloride, sodium chloride, zolpidem  Assessment/ Plan:  Todd [name] is a 80 y.o. male presenting with lethargy, worsening dyspnea and increasing serum Cr. He appears volume -overloaded  1. Acute renal failure appears to be in the setting of ACE Benazepril and use of voltaren NSAIDS. Clear that he is not wishing to pursue dialysis and I would not recommend this either. Spoke with daughter and she is interested in palliative care. This approach seems the most reasonable with limits set in terms of treatment.  2. HTN/volume Appears markedly edematous and will diurese patient  3. Anemia iron deficiency will give IV iron  4. CHF Continue IV diuresis  5. Metastatic Colon cancer No obstruction seen on recent U/S   Great diuresis and will continue on IV lasix appears symptomatically improved. Will send another urine. Creatinine does seem to increase with diuresis. Blood and protein noted on initial urinalysis and I shall send another. Could this represented an acute GN is always possible considering normal renal function in January. He was admitted with pneumonia in February although this resolved and was treated with antibiotics at that time. He also had nose bleeds that he had in February related to [facility] for history of pulmonary embolus.  Complicated picture  ATN     ACE /NSAIDS AIN     Antibiotics AGN   Immune or Pauci immune  This does seem academic at this point as patient is clearly able to make his own decisions and in view of his underlying cancer and poor prognosis would not wish to embark on dialysis. I would continue the current approach of symptom management and will discuss case with my partner Dr Florene Glen, who is covering for me next week      LOS: 2 Ermal Haberer W @TODAY @10 :30 AM

## 2013-09-23 NOTE — Progress Notes (Addendum)
No distress noted.  Tolerating being in chair without difficulty.  1552 5 beat V Tach, asymptomatic .MD made aware.  New orders given.

## 2013-09-23 NOTE — Progress Notes (Signed)
Family Medicine Teaching Service Daily Progress Note Intern Pager: [phone]  Patient name: Todd [name] Medical record number: [id number] Date of birth: August 28, 1929 Age: 80 y.o. Gender: male  Primary Care Provider: Willeen Niece, MD Consultants: cardiology and nephrology Code Status: full  Pt Overview and Major Events to Date:   Assessment and Plan: Todd [name] is a 80 y.o. male presenting with lethargy, worsening dyspnea and increasing serum Cr. He appeared initially volume -overloaded and is improving.   1. CHF exacerbation - Dyspnea improving, net negative fluid status - Echo with combined CHF with Ef 76-16%, Grade 2 diastolic, and known mod to severe MR - Continue IV lasix 120 IV q6 per nephrology - Negative 2.4 L  2. Recent Hx of PE in the setting of metastatic colon cancer:  -  DC Xarelto, due to kidney function this was discontinued and placed on  - Continue Lovenox.   3. AKI: concern for cardio-renal syndrome and also the use of ACEi and NSAIDs.  - Nephrology consulted - appreciate recommnedations.  - Cre trending up, 3.22->4.2 - Not a HD candidate - Continue Iv lasix  4. Anemia:  - s/p ferraheme - Hgb Stable at 7.5 today - if symptomatic will consider transfusion.  5.Recent PNA treated with abx and pt finished course. No fever, normal WBC, occasional cough and has had intermittent hemoptysis and epistaxis while on anticoagulation.  - Findings on CXR can be residual for prior process or his pulmonary congestion. Will monitor clinically and repeat CXR or start abx if needed.  -continue home oxygen   5. Metastatic colon cancer: no obstruction on imaging. Colostomy patent. - Hs been getting palliative chemo, s/p colon resection - Combined with worsening renal function will consult palliative   Other chronic conditions: HTN: controlled on metoprolol and now on furosemide HLD: on simvastatin 20   FEN/GI: HH diet PPx: Lovenox  Disposition: pending clinical  improvement of fluid overload.  Subjective: feeling better this am. Notes good UOP with lasix. Pt agrees that a palliative care consult would be appropriate.   Objective: Temp:  [97.6 F (36.4 C)-98.2 F (36.8 C)] 97.6 F (36.4 C) (03/01 0421) Pulse Rate:  [69-82] 69 (03/01 0421) Resp:  [20] 20 (03/01 0421) BP: (120-133)/(65-88) 120/65 mmHg (02/28 2217) SpO2:  [95 %-98 %] 95 % (03/01 0421) Weight:  [206 lb 9.1 oz (93.7 kg)] 206 lb 9.1 oz (93.7 kg) (03/01 0421)  Physical Exam: Gen: NAD, alert, cooperative with exam HEENT: NCAT CV: RRR, good S1/S2, no murmur Resp: diminished breath sounds at the bases BL, no wheezes, non-labored Abd: SNTND, BS present, no guarding or organomegaly Ext: 1-2+ pitting edema BL Neuro: Alert and oriented, No gross deficits  Laboratory:  Recent Labs Lab 09/19/13 1337 09/21/13 1525 09/22/13 0555  WBC 8.6 8.9 9.4  HGB 8.1* 7.6* 7.5*  HCT 24.0* 23.0* 22.7*  PLT 259 213 215    Recent Labs Lab 09/21/13 1525 09/22/13 0555 09/23/13 0540  NA 138 136* 140  K 4.2 4.3 4.0  CL 99 98 99  CO2 25 25 25   BUN 56* 59* 69*  CREATININE 3.22* 3.59* 4.21*  CALCIUM 8.8 8.8 8.7  PROT 6.9  --   --   BILITOT 0.9  --   --   ALKPHOS 42  --   --   ALT 8  --   --   AST 22  --   --   GLUCOSE 186* 101* 112*   Imaging/Diagnostic Tests: CXR 2/27 1. Increased opacity at the  bases may represent pneumonia as well as  atelectasis. Recommend continued followup.  2. Cardiomegaly with pulmonary vascular congestion and small right  effusion. Possible mild CHF   Todd Euler, MD 09/23/2013, 9:37 AM PGY-2, [place] Intern pager: [phone], text pages welcome

## 2013-09-23 NOTE — Progress Notes (Signed)
Primary cardiologist: Dr. Quay Burow  Subjective:    In bedside chair. Legs are feeling better with less edema. No chest pain.  Objective:   Temp:  [97.6 F (36.4 C)-98.2 F (36.8 C)] 97.6 F (36.4 C) (03/01 0421) Pulse Rate:  [69-83] 83 (03/01 1032) Resp:  [20] 20 (03/01 0421) BP: (120-133)/(56-78) 124/56 mmHg (03/01 1032) SpO2:  [95 %-98 %] 95 % (03/01 0421) Weight:  [206 lb 9.1 oz (93.7 kg)] 206 lb 9.1 oz (93.7 kg) (03/01 0421) Last BM Date: 09/20/13  Filed Weights   09/21/13 1414 09/22/13 0539 09/23/13 0421  Weight: 212 lb 8 oz (96.389 kg) 210 lb 12.2 oz (95.6 kg) 206 lb 9.1 oz (93.7 kg)    Intake/Output Summary (Last 24 hours) at 09/23/13 1119 Last data filed at 09/23/13 1055  Gross per 24 hour  Intake    480 ml  Output   2060 ml  Net  -1580 ml    Exam:  General: Overweight male, no distress.  Lungs: Decreased breath sounds with congestion at bases.  Cardiac: RRR, 2/6 apical and basal systolic murmurs, no gallop.  Extremities: 2+ edema.  Lab Results:  Basic Metabolic Panel:  Recent Labs Lab 09/21/13 1525 09/22/13 0555 09/23/13 0540  NA 138 136* 140  K 4.2 4.3 4.0  CL 99 98 99  CO2 25 25 25   GLUCOSE 186* 101* 112*  BUN 56* 59* 69*  CREATININE 3.22* 3.59* 4.21*  CALCIUM 8.8 8.8 8.7    Liver Function Tests:  Recent Labs Lab 09/21/13 1525  AST 22  ALT 8  ALKPHOS 42  BILITOT 0.9  PROT 6.9  ALBUMIN 2.7*    CBC:  Recent Labs Lab 09/19/13 1337 09/21/13 1525 09/22/13 0555  WBC 8.6 8.9 9.4  HGB 8.1* 7.6* 7.5*  HCT 24.0* 23.0* 22.7*  MCV 87.3 92.0 91.2  PLT 259 213 215    BNP:  Recent Labs  08/31/13 1758 09/02/13 1215 09/21/13 1525  PROBNP 3523.0* 9281.0* 13493.0*    ECG: Sinus rhythm with poor with progression and PVCs.  Echocardiogram (2/28): Study Conclusions  - Left ventricle: The cavity size was moderately dilated. Wall thickness was increased in a pattern of mild LVH. Systolic function was moderately  reduced. The estimated ejection fraction was in the range of 35% to 40%. There is akinesis of the mid-distalanteroseptal, anterior, inferior, and apical myocardium. Features are consistent with a pseudonormal left ventricular filling pattern, with concomitant abnormal relaxation and increased filling pressure (grade 2 diastolic dysfunction). - Aortic valve: Mildly to moderately calcified annulus. Trileaflet; mildly calcified leaflets. Cusp separation was mildly reduced. Mild regurgitation. Mean gradient: 40mm Hg (S). Peak gradient: 87mm Hg (S). VTI ratio of LVOT to aortic valve: 0.49. Moderately sclerotic to mildly stenotic aortic valve. - Mitral valve: Calcified annulus. Mildly thickened leaflets . Leaflet separation was mildly reduced. Moderate to severe regurgitation. PISA not reported - appears underestimated. Mitral regurgitant jet extends to base of atrium and there appears to systolic flow reversal on limited imaging of the RSPV. Suggests severe mitral regurgitation. - Left atrium: The atrium was severely dilated. - Right ventricle: The cavity size was mildly dilated. Systolic function was moderately reduced. - Right atrium: The atrium was mildly to moderately dilated. Central venous pressure: 51mm Hg (est). - Tricuspid valve: Mild regurgitation. - Pulmonary arteries: Systolic pressure was moderately to severely increased. PA peak pressure: 54mm Hg (S). - Pericardium, extracardiac: There was no pericardial effusion. Impressions:  - Mild LVH with moderate chamber dilatation,  LVEF approximately 35-40% with wall motion abnormalities consistent with ischemic cardiomyopathy. Grade 2 diastolic dysfunction. Severe left atrial enlargement. Moderate to likely severe mitral regurgitation as noted above. Moderately sclerotic to mildly stenotic aortic valve. Moderately reduced RV contraction. Mild tricuspid regurgitation with PASP 65 mmHg and elevated CVP. Moderate right atrial  enlargement.   Medications:   Scheduled Medications: . aspirin EC  81 mg Oral Daily  . doxazosin  4 mg Oral BID  . enoxaparin (LOVENOX) injection  100 mg Subcutaneous Q24H  . ferric gluconate (FERRLECIT/NULECIT) IV  125 mg Intravenous Once per day on Tue Thu Sat  . furosemide  120 mg Intravenous Q6H  . gabapentin  300 mg Oral Daily  . isosorbide mononitrate  60 mg Oral Daily  . metoprolol succinate  50 mg Oral Daily  . simvastatin  20 mg Oral QPM  . sodium chloride  3 mL Intravenous Q12H    PRN Medications: sodium chloride, benzonatate, sodium chloride, sodium chloride, zolpidem   Assessment:   1. Acute on chronic combined heart failure with continued evidence of volume overload.  2. Severe mitral regurgitation, see above echocardiogram report.  3. Ischemic cardiomyopathy, LVEF 35-40% with grade 2 diastolic dysfunction and severe pulmonary hypertension.  4. Acute renal failure with history of CKD, being managed now by the Nephrology team.  5. History of metastatic colon cancer.  6. History of pulmonary embolus, on Lovenox.  7. Anemia, hemoglobin relatively at stable 7.5.   Plan/Discussion:    Continue current cardiac regimen including IV diuresis with input from the nephrology team. Renal function is worsening. Patient does not want to pursue dialysis. Palliative care is being considered.   Satira Sark, M.D., F.A.C.C.

## 2013-09-24 DIAGNOSIS — D649 Anemia, unspecified: Secondary | ICD-10-CM

## 2013-09-24 LAB — CBC
HEMATOCRIT: 21.8 % — AB (ref 39.0–52.0)
HEMOGLOBIN: 7.2 g/dL — AB (ref 13.0–17.0)
MCH: 30.6 pg (ref 26.0–34.0)
MCHC: 33 g/dL (ref 30.0–36.0)
MCV: 92.8 fL (ref 78.0–100.0)
Platelets: 200 10*3/uL (ref 150–400)
RBC: 2.35 MIL/uL — AB (ref 4.22–5.81)
RDW: 18.4 % — ABNORMAL HIGH (ref 11.5–15.5)
WBC: 8.1 10*3/uL (ref 4.0–10.5)

## 2013-09-24 LAB — BASIC METABOLIC PANEL
BUN: 73 mg/dL — ABNORMAL HIGH (ref 6–23)
CHLORIDE: 97 meq/L (ref 96–112)
CO2: 26 meq/L (ref 19–32)
CREATININE: 4.54 mg/dL — AB (ref 0.50–1.35)
Calcium: 8.8 mg/dL (ref 8.4–10.5)
GFR calc non Af Amer: 11 mL/min — ABNORMAL LOW (ref >90)
GFR, EST AFRICAN AMERICAN: 13 mL/min — AB (ref >90)
Glucose, Bld: 103 mg/dL — ABNORMAL HIGH (ref 70–99)
POTASSIUM: 4.2 meq/L (ref 3.7–5.3)
Sodium: 137 mEq/L (ref 137–147)

## 2013-09-24 LAB — OCCULT BLOOD X 1 CARD TO LAB, STOOL: Fecal Occult Bld: NEGATIVE

## 2013-09-24 MED ORDER — WARFARIN SODIUM 5 MG PO TABS
5.0000 mg | ORAL_TABLET | Freq: Every day | ORAL | Status: DC
  Administered 2013-09-24: 5 mg via ORAL
  Filled 2013-09-24 (×2): qty 1

## 2013-09-24 MED ORDER — WARFARIN - PHARMACIST DOSING INPATIENT: Freq: Every day | Status: DC

## 2013-09-24 MED ORDER — ENOXAPARIN SODIUM 100 MG/ML ~~LOC~~ SOLN: 90.0000 mg | SUBCUTANEOUS | Status: DC

## 2013-09-24 MED ORDER — DOXAZOSIN MESYLATE 2 MG PO TABS
2.0000 mg | ORAL_TABLET | Freq: Two times a day (BID) | ORAL | Status: DC
  Filled 2013-09-24: qty 1

## 2013-09-24 MED ORDER — DOXAZOSIN MESYLATE 4 MG PO TABS
4.0000 mg | ORAL_TABLET | Freq: Two times a day (BID) | ORAL | Status: DC
  Administered 2013-09-24 – 2013-09-25 (×2): 4 mg via ORAL
  Filled 2013-09-24 (×4): qty 1

## 2013-09-24 MED ORDER — FUROSEMIDE 80 MG PO TABS
80.0000 mg | ORAL_TABLET | Freq: Three times a day (TID) | ORAL | Status: DC
  Administered 2013-09-25 (×2): 80 mg via ORAL
  Filled 2013-09-24 (×4): qty 1

## 2013-09-24 MED ORDER — FUROSEMIDE 80 MG PO TABS
80.0000 mg | ORAL_TABLET | Freq: Once | ORAL | Status: AC
Start: 2013-09-24 — End: 2013-09-24
  Administered 2013-09-24: 80 mg via ORAL
  Filled 2013-09-24: qty 1

## 2013-09-24 NOTE — Progress Notes (Signed)
Todd [name] is a 80 y.o. male presenting with lethargy, worsening dyspnea and increasing serum Cr. He appeared volume -overloaded / CHF 1. Acute renal failure appears to be in the setting of ACE Benazepril and use of voltaren NSAIDS.  2. HTN/volume better 3. Anemia iron deficiency will give IV iron  4. CHF Change to PO furosemide 5. Metastatic Colon cancer No obstruction seen on recent U/S  Rec Change to PO Furosemide at 80 mg TID and observe response  Subjective: Interval History: Diuresing. Wants to go home.  Objective: Vital signs in last 24 hours: Temp:  [97.6 F (36.4 C)-97.7 F (36.5 C)] 97.7 F (36.5 C) (03/02 0456) Pulse Rate:  [58-69] 65 (03/02 1010) Resp:  [18] 18 (03/02 1010) BP: (118-137)/(50-73) 137/63 mmHg (03/02 1010) SpO2:  [98 %] 98 % (03/02 0456) Weight:  [93.7 kg (206 lb 9.1 oz)] 93.7 kg (206 lb 9.1 oz) (03/02 0456) Weight change: 0 kg (0 lb)  Intake/Output from previous day: 03/01 0701 - 03/02 0700 In: 724 [P.O.:600; IV Piggyback:124] Out: 2200 [Urine:2200] Intake/Output this shift: Total I/O In: 80 [P.O.:80] Out: 301 [Urine:300; Stool:1]  General appearance: alert and cooperative Resp: clear to auscultation bilaterally Chest wall: no tenderness Cardio: regular rate and rhythm, S1, S2 normal, no murmur, click, rub or gallop Extremities: edema 1-@= pretib, trace presacral  Lab Results:  Recent Labs  09/22/13 0555 09/24/13 0531  WBC 9.4 8.1  HGB 7.5* 7.2*  HCT 22.7* 21.8*  PLT 215 200   BMET:  Recent Labs  09/23/13 1650 09/24/13 0531  NA 137 137  K 3.8 4.2  CL 98 97  CO2 26 26  GLUCOSE 123* 103*  BUN 70* 73*  CREATININE 4.32* 4.54*  CALCIUM 8.8 8.8   No results found for this basename: PTH,  in the last 72 hours Iron Studies: No results found for this basename: IRON, TIBC, TRANSFERRIN, FERRITIN,  in the last 72 hours Studies/Results: No results found.  Scheduled: . aspirin EC  81 mg Oral Daily  . doxazosin  2 mg Oral BID  .  [START ON 09/25/2013] enoxaparin (LOVENOX) injection  90 mg Subcutaneous Q24H  . ferric gluconate (FERRLECIT/NULECIT) IV  125 mg Intravenous Once per day on Tue Thu Sat  . furosemide  120 mg Intravenous Q6H  . gabapentin  300 mg Oral Daily  . isosorbide mononitrate  60 mg Oral Daily  . metoprolol succinate  50 mg Oral Daily  . simvastatin  20 mg Oral QPM  . sodium chloride  3 mL Intravenous Q12H     LOS: 3 days   Zaire Vanbuskirk C 09/24/2013,2:09 PM

## 2013-09-24 NOTE — Progress Notes (Signed)
Palliative medicine consult received via PMT phone-order placed into EPIC. We are happy to assist with the care of this patient and his family-we will schedule a goals of care meeting at earliest possible time we have a provider available.  Lane Hacker, DO Palliative Medicine

## 2013-09-24 NOTE — Progress Notes (Signed)
Returned call for Dr. Annabelle Harman.  Inform of 16 beats of V tach, no new orders received.  Will continue to monitor.  Alphonzo Lemmings, RN

## 2013-09-24 NOTE — Progress Notes (Signed)
ANTICOAGULATION CONSULT NOTE - Follow Up Consult  Pharmacy Consult for Coumadin Indication: hx PE 03/2013  No Known Allergies  Patient Measurements: Height: 5\' 9"  (175.3 cm) Weight: 206 lb 9.1 oz (93.7 kg) IBW/kg (Calculated) : 70.7  Vital Signs: Temp: 98 F (36.7 C) (03/02 1415) Temp src: Oral (03/02 1415) BP: 109/59 mmHg (03/02 1415) Pulse Rate: 62 (03/02 1415)  Labs:  Recent Labs  09/22/13 0555 09/23/13 0540 09/23/13 1650 09/24/13 0531  HGB 7.5*  --   --  7.2*  HCT 22.7*  --   --  21.8*  PLT 215  --   --  200  CREATININE 3.59* 4.21* 4.32* 4.54*    Estimated Creatinine Clearance: 14.4 ml/min (by C-G formula based on Cr of 4.54).  Assessment:   Off Xarelto due to worsening renal function.  Last dose 2/27. Noted hx epistaxis and hemoptysis while on Xarelto.   Lovenox given 2/28 and 3/1, but refused today; discontinued.  No bridging planned.   Coumadin 5 mg daily to begin tonight.  INR goal ~ 2.0.  Goal of Therapy:  INR ~2.0 Monitor platelets by anticoagulation protocol: Yes   Plan:   Coumadin 5 mg daily (at 6pm) to begin tonight.  Daily PT/INR.  CBC at least every 3 days while inpatient.  Arty Baumgartner, [place] Pager: [phone] 09/24/2013,4:25 PM

## 2013-09-24 NOTE — Progress Notes (Signed)
Lovenox per pharmacy  Anticoag: Has hx of PE in 2014 and was put on xarelto 20mg  po daily for that.  Patient had intermittent epistaxis and hemoptysis while on xarelto.  Patient is in acute renal failure, lovenox will be used while inpatient instead.  Per patient, he took his last dose of xarelto on 09/21/13 at ~0800  4hr LMWH level goal is 0.6-1.2 Will monitor anticoagulation and platelets  Hem/Onc: Hgb 7.2 and Plt 200 K on 02/25. Hx of colon cancer Renal: Acute renal failure; SCr has been rising the past month.  Now has doubled in the past two weeks, Scr 4.54 today. Lytes ok  Plan: - Will reduce dose to 90 mg [place] daily as pt's weight has decreased to 93.7 kg and due to progressively worsening renal function - CBC q72h - Monitor for s/sx bleeding  Hughes Better, PharmD, BCPS Clinical Pharmacist Pager: [phone] 09/24/2013 10:07 AM

## 2013-09-24 NOTE — Progress Notes (Signed)
Informed by central monitoring that pt. Had 16 beats of V tach, pt. Was resting in bed while RN was in room giving morning medication.  VSS - Blood pressure 137/63, pulse 65, temperature 97.7 F (36.5 C), temperature source Oral, resp. rate 18, height 5\' 9"  (1.753 m), weight 93.7 kg (206 lb 9.1 oz), SpO2 98.00%., O2   @ 2 L.  Paged [phone] for MD, will await for return call.  Alphonzo Lemmings, RN

## 2013-09-24 NOTE — Progress Notes (Signed)
Subjective: Feeling better. Breathing has improved but he continues on supplemental O2. He went most of the morning w/o [place], but restarted once O2 stats dropped down to 87. He is currently on 3L (uses 2L at home). Denies CP.   Objective: Vital signs in last 24 hours: Temp:  [97.6 F (36.4 C)-97.7 F (36.5 C)] 97.7 F (36.5 C) (03/02 0456) Pulse Rate:  [58-69] 65 (03/02 1010) Resp:  [18] 18 (03/02 1010) BP: (118-137)/(50-73) 137/63 mmHg (03/02 1010) SpO2:  [98 %] 98 % (03/02 0456) Weight:  [206 lb 9.1 oz (93.7 kg)] 206 lb 9.1 oz (93.7 kg) (03/02 0456) Last BM Date: 09/23/13  Intake/Output from previous day: 03/01 0701 - 03/02 0700 In: 724 [P.O.:600; IV Piggyback:124] Out: 2200 [Urine:2200] Intake/Output this shift: Total I/O In: 80 [P.O.:80] Out: 101 [Urine:100; Stool:1]  Medications Current Facility-Administered Medications  Medication Dose Route Frequency Provider Last Rate Last Dose  . 0.9 %  sodium chloride infusion  250 mL Intravenous PRN Willeen Niece, MD 10 mL/hr at 09/21/13 1844 250 mL at 09/21/13 1844  . aspirin EC tablet 81 mg  81 mg Oral Daily Willeen Niece, MD   81 mg at 09/24/13 0947  . benzonatate (TESSALON) capsule 100 mg  100 mg Oral BID PRN Willeen Niece, MD      . doxazosin (CARDURA) tablet 2 mg  2 mg Oral BID Tawanna Sat, MD      . Derrill Memo ON 09/25/2013] enoxaparin (LOVENOX) injection 90 mg  90 mg Subcutaneous Q24H Andrena Mews, MD      . ferric gluconate (NULECIT) 125 mg in sodium chloride 0.9 % 100 mL IVPB  125 mg Intravenous Once per day on Tue Thu Sat Sherril Croon, MD   125 mg at 09/22/13 1439  . furosemide (LASIX) 120 mg in dextrose 5 % 50 mL IVPB  120 mg Intravenous Q6H Sherril Croon, MD   120 mg at 09/24/13 0947  . gabapentin (NEURONTIN) capsule 300 mg  300 mg Oral Daily Willeen Niece, MD   300 mg at 09/24/13 0947  . isosorbide mononitrate (IMDUR) 24 hr tablet 60 mg  60 mg Oral Daily Willeen Niece, MD   60 mg at 09/24/13 0947  . metoprolol succinate  (TOPROL-XL) 24 hr tablet 50 mg  50 mg Oral Daily Willeen Niece, MD   50 mg at 09/24/13 0946  . simvastatin (ZOCOR) tablet 20 mg  20 mg Oral QPM Willeen Niece, MD   20 mg at 09/23/13 1834  . sodium chloride (OCEAN) 0.65 % nasal spray 1 spray  1 spray Each Nare PRN Willeen Niece, MD      . sodium chloride 0.9 % injection 3 mL  3 mL Intravenous Q12H Willeen Niece, MD   3 mL at 09/24/13 0956  . sodium chloride 0.9 % injection 3 mL  3 mL Intravenous PRN Willeen Niece, MD      . zolpidem (AMBIEN) tablet 5 mg  5 mg Oral QHS PRN Dayarmys [place] de Gwendalyn Ege, MD        PE: General appearance: alert, cooperative and no distress Neck: mild JVD Lungs: clear to auscultation bilaterally Heart: regular rate and rhythm Extremities: no LEE Pulses: 2+ and symmetric Skin: warm and dry Neurologic: Grossly normal  Lab Results:   Recent Labs  09/21/13 1525 09/22/13 0555 09/24/13 0531  WBC 8.9 9.4 8.1  HGB 7.6* 7.5* 7.2*  HCT 23.0* 22.7* 21.8*  PLT 213  215 200   BMET  Recent Labs  09/23/13 0540 09/23/13 1650 09/24/13 0531  NA 140 137 137  K 4.0 3.8 4.2  CL 99 98 97  CO2 25 26 26   GLUCOSE 112* 123* 103*  BUN 69* 70* 73*  CREATININE 4.21* 4.32* 4.54*  CALCIUM 8.7 8.8 8.8   Filed Weights   09/22/13 0539 09/23/13 0421 09/24/13 0456  Weight: 210 lb 12.2 oz (95.6 kg) 206 lb 9.1 oz (93.7 kg) 206 lb 9.1 oz (93.7 kg)     Assessment/Plan  Principal Problem:   Acute on chronic combined systolic and diastolic congestive heart failure Active Problems:   CAD, Ant MI 1990- total LAD with collaterals, Myoview low risk 3/12   Colon cancer metastasized to liver   Acute pulmonary embolism- 04/02/13   Acute kidney injury   Hemoptysis   CHF (congestive heart failure)   Mitral regurgitation, mod to severe 09/2012 echo  Plan: Feeling better. Breathing has improved but he continues on supplemental O2. He went most of the morning w/o [place], but restarted once O2 sats dropped down to 87. He is currently on 3L  (uses 2L at home). Denies CP. He diuresed 2.2 L in last 24 hrs. Net negative ~4L since admission. He remains on IV Lasix at 120 mg Q6H. Scr continues to rise at 4.54. He does not wish to pursue HD. Continue with nephrology recommendations regarding lasix dosing. Also awaiting palliative care consult. From a cardiovascular standpoint, continue ASA, BB, statin and nitrate for ischemic cardiomyopathy. Will await renal's recs for PO diuretic. Also recommend following Hgb as it continues to decline at 7.2. ? Need for transfusion. MD to follow.       LOS: 3 days    Brittainy M. Ladoris Gene 09/24/2013 10:35 AM  Echo Study Conclusions  - Left ventricle: The cavity size was moderately dilated. Wall thickness was increased in a pattern of mild LVH. Systolic function was moderately reduced. The estimated ejection fraction was in the range of 35% to 40%. There is akinesis of the mid-distalanteroseptal, anterior, inferior, and apical myocardium. Features are consistent with a pseudonormal left ventricular filling pattern, with concomitant abnormal relaxation and increased filling pressure (grade 2 diastolic dysfunction). - Aortic valve: Mildly to moderately calcified annulus. Trileaflet; mildly calcified leaflets. Cusp separation was mildly reduced. Mild regurgitation. Mean gradient: 37mm Hg (S). Peak gradient: 32mm Hg (S). VTI ratio of LVOT to aortic valve: 0.49. Moderately sclerotic to mildly stenotic aortic valve. - Mitral valve: Calcified annulus. Mildly thickened leaflets . Leaflet separation was mildly reduced. Moderate to severe regurgitation. PISA not reported - appears underestimated. Mitral regurgitant jet extends to base of atrium and there appears to systolic flow reversal on limited imaging of the RSPV. Suggests severe mitral regurgitation. - Left atrium: The atrium was severely dilated. - Right ventricle: The cavity size was mildly dilated. Systolic function was moderately  reduced. - Right atrium: The atrium was mildly to moderately dilated. Central venous pressure: 49mm Hg (est). - Tricuspid valve: Mild regurgitation. - Pulmonary arteries: Systolic pressure was moderately to severely increased. PA peak pressure: 79mm Hg (S). - Pericardium, extracardiac: There was no pericardial effusion. Impressions:  - Mild LVH with moderate chamber dilatation, LVEF approximately 35-40% with wall motion abnormalities consistent with ischemic cardiomyopathy. Grade 2 diastolic dysfunction. Severe left atrial enlargement. Moderate to likely severe mitral regurgitation as noted above. Moderately sclerotic to mildly stenotic aortic valve. Moderately reduced RV contraction. Mild tricuspid regurgitation with PASP 65 mmHg and elevated CVP. Moderate right  atrial enlargement.   Patient seen and examined. Agree with assessment and plan. Good diuresis yesterday -[id number];  -[phone] since admission. Wt 206 down from 212. Cr now increased to 4.54. H/H 7.2/21.8, if decreases further recommend PRBC transfusion. No chest pain. EF 35-40% with LVH and grade 2 diastolic dysfunction and severe pulmonary HTN with PA  65 mm Hg.   Troy Sine, MD, [facility] 09/24/2013 11:32 AM

## 2013-09-24 NOTE — Progress Notes (Signed)
[facility] Attending/Primary Physician Note  I saw and examined Todd [name] this morning, his wife [name] at bedside as well.  He reports feeling much improved this morning, had BM and has nearly complete resolution of his dyspnea.  Not requiring nasal canula oxygen supplementation this morning.  He is eager to go home today.  We discussed post-discharge plans.  He and wife are interested in meeting the Palliative Care team; [name] will be at her own doctor's appointment from 10 to 12:30pm today, but is hopeful to be present when the Palliative team consults.  Todd. Diskin reaffirms his decision not to embark on dialysis despite his worsening renal function.   We discussed the approach to anticoagulation. Given his renal dysfunction, the Xarelto is not advisable.  His last VTE event was in Sept 2014 (four to five months ago), so I believe that if we were to switch to warfarin, we would not need to bridge as we would for active VTE treatment.  His malignancy and sedentary state-- along with his history of recurrent VTEs-- put him at markedly increased risk for another VTE.  He reports that his hemoptysis has resolved in the past 2 days or so.  Hemoglobins remain stable in the mid-7 range.  We discussed a plan that includes: 1. Palliative Care consult before discharge-- wife will be out from 10am to 12:30pm, would like very much to be at the Palliative team consult; 2. Discharge on oral diuresis; 3. Start on warfarin without bridging (5mg  nightly), with a follow-up INR and Hgb/Hct in the [facility] on Thursday, March 5th; 4. Home Health nursing and PT upon discharge.  To discuss with the [facility] resident team.   [name], MD

## 2013-09-24 NOTE — Progress Notes (Signed)
Family Medicine Teaching Service Daily Progress Note Intern Pager: [phone]  Patient name: Todd [name] Medical record number: [id number] Date of birth: 08/04/1929 Age: 80 y.o. Gender: male  Primary Care Provider: Willeen Niece, MD Consultants: Cardiology, Nephrology Code Status: FULL  Pt Overview and Major Events to Date:  2/27 chest xray- possible PNA, small right effusion 3/1- creatinine increasing with diuresis; continue regimen; pt not wishing to start dialysis- will pursue palliative care  Assessment and Plan: Todd [name] is a 80 y.o. male who presented with lethargy, worsening dyspnea and increasing serum Cr. He appeared initially volume -overloaded and is improving.   1. CHF exacerbation - Dyspnea improving, net negative fluid status - Echo with combined CHF with Ef 51-02%, Grade 2 diastolic, and known mod to severe MR - Continue IV lasix 120 IV q6 per nephrology - Negative 1.5 L in last 24 hrs; Net negative 4 L since admission - Begin transition to PO diuretics for d/c home- Will discuss with nephro prior to ordering.  2. Recent Hx of PE in the setting of metastatic colon cancer:  - Last event in September of 2014 - DC Xarelto, due to kidney function this was discontinued  - Continue Lovenox inpatient - Will be started on Coumadin 5 mg for outpatient prophylaxis therapy- INR check this wk with goal ~2  3. AKI: concern for cardio-renal syndrome and also the use of ACEi and NSAIDs.  - Nephrology consulted - following recommendations.  - Cr trending up, 3.22>4.32>4.54 - Not a dialysis candidate due to preference and poor prognosis - Continue IV lasix  4. Anemia:  - Pt with prior hx of epistaxis and hemoptysis while on Xarelto  - s/p ferraheme - Hgb Stable at 7.2 today, asx - FOBT pending  5. Recent PNA treated with abx and pt finished course. No fever, normal WBC, occasional cough and has had intermittent hemoptysis and epistaxis while on anticoagulation.  - Findings  on CXR can be residual for prior process or his pulmonary congestion. Will monitor clinically and repeat CXR or start abx if needed.  - Continue home oxygen   5. Metastatic colon cancer: no obstruction on imaging. Colostomy patent. - Has been getting palliative chemo, s/p colon resection - Combined with worsening renal function will consult palliative care  Other chronic conditions: HTN: controlled on metoprolol and now on furosemide HLD: on simvastatin 20   FEN/GI: HH diet PPx: Lovenox  Disposition: pending clinical improvement of fluid overload.  Subjective: Pt seen and examined at beside. He is feeling much better this morning and has been on and off his oxygen without becoming short of breath. Notes good UOP with lasix. Denies any headaches, shortness of breath, chest pain or nausea. Pt agrees that a palliative care consult would be appropriate and is to be seen today.   Objective: Temp:  [97.6 F (36.4 C)-97.7 F (36.5 C)] 97.7 F (36.5 C) (03/02 0456) Pulse Rate:  [58-83] 69 (03/02 0456) Resp:  [18] 18 (03/02 0456) BP: (118-128)/(50-73) 128/73 mmHg (03/02 0456) SpO2:  [98 %] 98 % (03/02 0456) Weight:  [206 lb 9.1 oz (93.7 kg)] 206 lb 9.1 oz (93.7 kg) (03/02 0456)  Physical Exam: Gen: Pleasant male in NAD, alert, cooperative with exam HEENT: NCAT, MMM CV: RRR, good S1/S2, no murmur noted Resp: diminished breath sounds at the bases BL, no wheezes, non-labored Abd: SNTND, BS present, no guarding or organomegaly; colostomy bag on L abdomen  Ext: 2-3+ pitting edema BL Neuro: Alert and oriented, No gross deficits  Laboratory:  Recent Labs Lab 09/21/13 1525 09/22/13 0555 09/24/13 0531  WBC 8.9 9.4 8.1  HGB 7.6* 7.5* 7.2*  HCT 23.0* 22.7* 21.8*  PLT 213 215 200    Recent Labs Lab 09/21/13 1525  09/23/13 0540 09/23/13 1650 09/24/13 0531  NA 138  < > 140 137 137  K 4.2  < > 4.0 3.8 4.2  CL 99  < > 99 98 97  CO2 25  < > 25 26 26   BUN 56*  < > 69* 70* 73*   CREATININE 3.22*  < > 4.21* 4.32* 4.54*  CALCIUM 8.8  < > 8.7 8.8 8.8  PROT 6.9  --   --   --   --   BILITOT 0.9  --   --   --   --   ALKPHOS 42  --   --   --   --   ALT 8  --   --   --   --   AST 22  --   --   --   --   GLUCOSE 186*  < > 112* 123* 103*  < > = values in this interval not displayed. Imaging/Diagnostic Tests: CXR 2/27 1. Increased opacity at the bases may represent pneumonia as well as  atelectasis. Recommend continued followup.  2. Cardiomegaly with pulmonary vascular congestion and small right  effusion. Possible mild CHF   Geanie Cooley, Med Student 09/24/2013, 8:58 AM [facility]  I have seen and examined patient with MS4 and my additions are included here.  S: Feeling better with decreased dyspnea and LE swelling. O: BP 137/63  Pulse 65  Temp(Src) 97.7 F (36.5 C) (Oral)  Resp 18  Ht 5\' 9"  (1.753 m)  Wt 206 lb 9.1 oz (93.7 kg)  BMI 30.49 kg/m2  SpO2 98% GEN: NAD, pleasant, seated side of bed CV: occasional PVC, II/VI systolic murmur PULM: Mostly clear with occasional faint crackle, no wheezes, normal work of breathing, no room air Extr: 3+ LE pitting edema to knees, much improved from reported yesterday, with compression stockings in place to knee A/P:  - F/u palliative care recommendations re: discharge and goals of care in pt with h/o malignancy, CHF, and now worsened CKD. - F/u with renal re: when it is appropriate to transition to oral diuretic. - Pt very much wants to be discharged home. Will f/u on above first and hope for d/c late today vs early tomorrow, as his symptoms have greatly improved. - Will order Home Health RN and PT prior to discharge. - Needs f/u hgb in clinic. Slow downtrending with h/o hemoptysis that has resolved. Not symptomatic. Taking PO iron as outpatient and received IV Fe here. - After discussion with pt, will transition to coumadin 5mg  daily with no bridge for PE in 03/2013 and recheck INR in clinic at f/u  appt later this week, with goal ~2.  Hilton Sinclair, MD

## 2013-09-24 NOTE — Progress Notes (Signed)
Palliative meeting scheduled for 11AM 09/25/13 with patient's family for goals of care.  Lane Hacker, DO Palliative Medicine

## 2013-09-25 ENCOUNTER — Telehealth: Payer: Self-pay | Admitting: *Deleted

## 2013-09-25 ENCOUNTER — Telehealth: Payer: Self-pay | Admitting: Medical Oncology

## 2013-09-25 DIAGNOSIS — Z515 Encounter for palliative care: Secondary | ICD-10-CM

## 2013-09-25 DIAGNOSIS — R5383 Other fatigue: Secondary | ICD-10-CM

## 2013-09-25 DIAGNOSIS — R5381 Other malaise: Secondary | ICD-10-CM

## 2013-09-25 DIAGNOSIS — R531 Weakness: Secondary | ICD-10-CM

## 2013-09-25 LAB — PROTIME-INR
INR: 1.31 (ref 0.00–1.49)
Prothrombin Time: 16 seconds — ABNORMAL HIGH (ref 11.6–15.2)

## 2013-09-25 LAB — BASIC METABOLIC PANEL
BUN: 79 mg/dL — AB (ref 6–23)
CO2: 25 meq/L (ref 19–32)
CREATININE: 4.62 mg/dL — AB (ref 0.50–1.35)
Calcium: 8.7 mg/dL (ref 8.4–10.5)
Chloride: 99 mEq/L (ref 96–112)
GFR calc Af Amer: 12 mL/min — ABNORMAL LOW (ref >90)
GFR calc non Af Amer: 11 mL/min — ABNORMAL LOW (ref >90)
Glucose, Bld: 96 mg/dL (ref 70–99)
Potassium: 3.8 mEq/L (ref 3.7–5.3)
Sodium: 141 mEq/L (ref 137–147)

## 2013-09-25 LAB — CBC
HEMATOCRIT: 21.4 % — AB (ref 39.0–52.0)
Hemoglobin: 7.2 g/dL — ABNORMAL LOW (ref 13.0–17.0)
MCH: 31 pg (ref 26.0–34.0)
MCHC: 33.6 g/dL (ref 30.0–36.0)
MCV: 92.2 fL (ref 78.0–100.0)
Platelets: 189 10*3/uL (ref 150–400)
RBC: 2.32 MIL/uL — AB (ref 4.22–5.81)
RDW: 18.3 % — ABNORMAL HIGH (ref 11.5–15.5)
WBC: 7 10*3/uL (ref 4.0–10.5)

## 2013-09-25 MED ORDER — FUROSEMIDE 80 MG PO TABS: 80.0000 mg | ORAL_TABLET | Freq: Three times a day (TID) | ORAL | Status: DC

## 2013-09-25 MED ORDER — COUMADIN BOOK
Freq: Once | Status: AC
  Administered 2013-09-25: 13:00:00
  Filled 2013-09-25: qty 1

## 2013-09-25 MED ORDER — WARFARIN VIDEO
Freq: Once | Status: AC
  Administered 2013-09-25: 13:00:00

## 2013-09-25 MED ORDER — WARFARIN SODIUM 5 MG PO TABS: 5.0000 mg | ORAL_TABLET | Freq: Every day | ORAL | Status: DC

## 2013-09-25 NOTE — Discharge Summary (Signed)
[facility] Discharge Summary  Patient name: Todd [name] Medical record number: [id number] Date of birth: 01/25/1930 Age: 80 y.o. Gender: male Date of Admission: 09/21/2013  Date of Discharge: 09/25/2013 Admitting Physician: Andrena Mews, MD  Primary Care Provider: Willeen Niece, MD Consultants: Nephrology, Cardiology  Indication for Hospitalization: Fluid overload, Dyspnea, Acute Renal Failure  Discharge Diagnoses/Problem List:  1. CHF exacerbation, improved 2. AKI, stable 3. Anemia 4. Hx of PE in the setting of metastatic colon cancer 5. Metastatic Colon Cancer 6. Hypertension, well-controlled 7. Hyperlipidemia, well-controlled   Disposition: Discharge home with hospice care  Discharge Condition: Stable  Brief Hospital Course:  Todd [name] is an 80 y/o male admitted for acute daistolic CHF and acute on chronic AKI.   1. CHF exacerbation, improved  Cardiology and nephrology were  consulted and the patient was started on IV Lasix 120 mg q6h for aggressive diuresis. He had an echocardiogram performed which showed a combined systolic CHF with EF of 78-29% and Grade 2 diastolic  dysfunction, with stable moderate to severe MR.   The patient was continued on IV Lasix with gradual improvement in his dyspnea and lower extremity edema over the course of his 4-day stay. He was diuresing well with a net total of ~5 L out. He was transitioned from IV Lasix to PO Lasix 80 mg TID on the day prior to discharge with good UOP and only minimal rise in creatinine.   2. AKI, stable  The patient's creatinine had been continually increasing over the past month, from a baseline of 1.0, and was noted to be 3.22 on admission. Nephrology was consulted for his acute renal injury in the setting of fluid overload. Due to the patient's metastatic colon cancer, poor prognosis and preference, the patient was not considered a candidate for dialysis. Per nephrology, the patient was  started on IV Lasix 120 mg q6h for diuresis. Over the course of his stay, the patient's creatinine increased from 3.22 to 4.62 on day of discharge. He has close follow up in the clinic and labs scheduled in 2 days.   3. Anemia  Iron panel shows low iron and patient has had several recent epistaxis events. He was given IV ferraheme and his Hgb remained stable form 7.2 to 7.5 throughout admission. He remained asymptomatic throughout admission,   4. Hx of PE in the setting of metastatic colon cancer  Xarelto was stopped due to renal dysfunction and he was started on lovenox. He was transitioned to warfarin with close follow up in our clinic for an INR check. His goal will be as close to 2 as possible with previous epistaxis.   5. Metastatic Colon Cancer  The patient has a history of metastatic colon cancer to the liver, with sigmoid colon resection and end ostomy in February 2014. He has been getting palliative chemo s/p colon resection. Combined with his worsening renal function, palliative care was consulted, and after much discussion the patient has decided upon home hospice care with the decision to be made DNR.   Global coordination of care  Palliative care was consulted and the patient and his wife had an extensive discussion with his PCP. He decided to be DNR and pursue home hospice on DC.    Issues for Follow Up:  - Hb/Hct, INR and BMP checks outpatient on Thurs, 3/5 - Home hospice care - R portacath flushed per hospice protocol  Significant Procedures: None   Significant Labs and Imaging:   Recent Labs Lab 09/22/13  8119 09/24/13 0531 09/25/13 0430  WBC 9.4 8.1 7.0  HGB 7.5* 7.2* 7.2*  HCT 22.7* 21.8* 21.4*  PLT 215 200 189    Recent Labs Lab 09/21/13 1525 09/22/13 0555 09/23/13 0540 09/23/13 1650 09/24/13 0531 09/25/13 0430  NA 138 136* 140 137 137 141  K 4.2 4.3 4.0 3.8 4.2 3.8  CL 99 98 99 98 97 99  CO2 25 25 25 26 26 25   GLUCOSE 186* 101* 112* 123* 103* 96   BUN 56* 59* 69* 70* 73* 79*  CREATININE 3.22* 3.59* 4.21* 4.32* 4.54* 4.62*  CALCIUM 8.8 8.8 8.7 8.8 8.8 8.7  MG  --   --   --  2.1  --   --   ALKPHOS 42  --   --   --   --   --   AST 22  --   --   --   --   --   ALT 8  --   --   --   --   --   ALBUMIN 2.7*  --   --   --   --   --    Imaging Studies 09/21/2013   CLINICAL DATA:  Shortness of breath, coughing up blood  EXAM: CHEST  2 VIEW  COMPARISON:  Chest x-ray of 09/12/2013  FINDINGS: There is more opacity particular at the left lung base suspicious for pneumonia. In addition, there is cardiomegaly present with mild pulmonary vascular congestion and at least a small right pleural effusion. These findings may indicate superimposed mild congestive heart failure as well. Right-sided central venous line tip overlies the expected SVC -RA junction. Diffuse degenerative change is noted throughout the thoracic spine.  IMPRESSION: 1. Increased opacity at the bases may represent pneumonia as well as atelectasis. Recommend continued followup. 2. Cardiomegaly with pulmonary vascular congestion and small right effusion. Possible mild CHF.   Electronically Signed   By: Ivar Drape M.D.   On: 09/21/2013 15:10   Results/Tests Pending at Time of Discharge: None  Discharge Medications:    Medication List    STOP taking these medications       torsemide 20 MG tablet  Commonly known as:  DEMADEX     XARELTO 20 MG Tabs tablet  Generic drug:  Rivaroxaban      TAKE these medications       aspirin EC 81 MG tablet  Take 81 mg by mouth daily.     benzonatate 100 MG capsule  Commonly known as:  TESSALON  Take 1 capsule (100 mg total) by mouth 2 (two) times daily as needed for cough.     chlorhexidine 0.12 % solution  Commonly known as:  PERIDEX  Use as directed 15 mLs in the mouth or throat 2 (two) times daily.     CINNAMON PO  Take 1 tablet by mouth daily.     diclofenac sodium 1 % Gel  Commonly known as:  VOLTAREN  Apply 2 g topically 3  (three) times daily as needed (for pain).     diphenoxylate-atropine 2.5-0.025 MG per tablet  Commonly known as:  LOMOTIL  Take 2 tablets by mouth 4 (four) times daily as needed for diarrhea or loose stools.     doxazosin 8 MG tablet  Commonly known as:  CARDURA  Take 4 mg by mouth 2 (two) times daily.     ENSURE PO  Take 1 Can by mouth daily.     furosemide 80 MG tablet  Commonly  known as:  LASIX  Take 1 tablet (80 mg total) by mouth 3 (three) times daily.     gabapentin 300 MG capsule  Commonly known as:  NEURONTIN  Take 300 mg by mouth every evening.     iron polysaccharides 150 MG capsule  Commonly known as:  NU-IRON  Take 1 capsule (150 mg total) by mouth daily.     isosorbide mononitrate 60 MG 24 hr tablet  Commonly known as:  IMDUR  Take 60 mg by mouth daily.     lidocaine-prilocaine cream  Commonly known as:  EMLA  Apply 1 application topically daily as needed (to port). For port     metoprolol succinate 50 MG 24 hr tablet  Commonly known as:  TOPROL-XL  Take 1 tablet (50 mg total) by mouth daily. Take with or immediately following a meal.     multivitamin tablet  Take 1 tablet by mouth every evening.     mupirocin cream 2 %  Commonly known as:  BACTROBAN  Apply 1 application topically 2 (two) times daily as needed (for skin irritation around stoma).     nitroGLYCERIN 0.4 MG SL tablet  Commonly known as:  NITROSTAT  Place 0.4 mg under the tongue every 5 (five) minutes as needed for chest pain.     potassium chloride 10 MEQ tablet  Commonly known as:  K-DUR  Take 1 tablet (10 mEq total) by mouth daily.     simvastatin 40 MG tablet  Commonly known as:  ZOCOR  Take 20 mg by mouth every evening.     sodium chloride 0.65 % Soln nasal spray  Commonly known as:  OCEAN  Place 1 spray into both nostrils as needed for congestion.     SYSTANE OP  Place 1 drop into both eyes daily.     vitamin E 400 UNIT capsule  Take 400 Units by mouth daily.      warfarin 5 MG tablet  Commonly known as:  COUMADIN  Take 1 tablet (5 mg total) by mouth daily.        Discharge Instructions: Please refer to Patient Instructions section of EMR for full details.  Patient was counseled important signs and symptoms that should prompt return to medical care, changes in medications, dietary instructions, activity restrictions, and follow up appointments.   Follow-Up Appointments: Follow-up Information   Follow up with Conni Slipper, MD On 09/28/2013. (at 10:15AM for a hospital follow-up with Dr T)    Specialty:  Family Medicine   Contact information:   [place] [address] [phone]       Follow up with Lorretta Harp, MD On 10/23/2013. (at 2:45 pm)    Specialty:  Cardiology   Contact information:   [address] [place] [address] [phone]       Follow up with [facility] On 09/27/2013. (at 0930 for labwork- INR, Hgb/Hct, BMP)    Contact information:   [address] [place], [place] [address]      Follow up with [facility]). ([facility] to follow aftr d/c please notify whn pt ready to leave unit at d./c call [phone] (or [phone] aftr 5 pm))    Contact information:   [facility] [place], [place], MD 09/25/2013, 3:15 PM [place]

## 2013-09-25 NOTE — Discharge Instructions (Signed)
You were admitted with fluid overload and given a large dose of diuretic (furosemide) which helped get the fluid off your lungs and legs. Your kidneys did suffer some and the kidney doctors followed along with [place].   - When you go home, you will need to continue taking your diuretic 3 times daily at the dose we prescribe.  - Take coumadin 5mg  daily. - Follow-up as scheduled with Family Practice, where you will get your kidney function and coumadin levels checked.    Information on my medicine - Coumadin   (Warfarin)  Why was Coumadin prescribed for you? Coumadin was prescribed for you because you have a blood clot or a medical condition that can cause an increased risk of forming blood clots. Blood clots can cause serious health problems by blocking the flow of blood to the heart, lung, or brain. Coumadin can prevent harmful blood clots from forming. As a reminder your indication for Coumadin is:   Select from menu  What test will check on my response to Coumadin? While on Coumadin (warfarin) you will need to have an INR test regularly to ensure that your dose is keeping you in the desired range. The INR (international normalized ratio) number is calculated from the result of the laboratory test called prothrombin time (PT).  If an INR APPOINTMENT HAS NOT ALREADY BEEN MADE FOR YOU please schedule an appointment to have this lab work done by your health care provider within 7 days. Your INR goal is usually a number between:  2 to 3 or your provider may give you a more narrow range like 2-2.5.  Ask your health care provider during an office visit what your goal INR is.  What  do you need to  know  About  COUMADIN? Take Coumadin (warfarin) exactly as prescribed by your healthcare provider about the same time each day.  DO NOT stop taking without talking to the doctor who prescribed the medication.  Stopping without other blood clot prevention medication to take the place of Coumadin may increase  your risk of developing a new clot or stroke.  Get refills before you run out.  What do you do if you miss a dose? If you miss a dose, take it as soon as you remember on the same day then continue your regularly scheduled regimen the next day.  Do not take two doses of Coumadin at the same time.  Important Safety Information A possible side effect of Coumadin (Warfarin) is an increased risk of bleeding. You should call your healthcare provider right away if you experience any of the following:   Bleeding from an injury or your nose that does not stop.   Unusual colored urine (red or dark brown) or unusual colored stools (red or black).   Unusual bruising for unknown reasons.   A serious fall or if you hit your head (even if there is no bleeding).  Some foods or medicines interact with Coumadin (warfarin) and might alter your response to warfarin. To help avoid this:   Eat a balanced diet, maintaining a consistent amount of Vitamin K.   Notify your provider about major diet changes you plan to make.   Avoid alcohol or limit your intake to 1 drink for women and 2 drinks for men per day. (1 drink is 5 oz. wine, 12 oz. beer, or 1.5 oz. liquor.)  Make sure that ANY health care provider who prescribes medication for you knows that you are taking Coumadin (warfarin).  Also  make sure the healthcare provider who is monitoring your Coumadin knows when you have started a new medication including herbals and non-prescription products.  Coumadin (Warfarin)  Major Drug Interactions  Increased Warfarin Effect Decreased Warfarin Effect  Alcohol (large quantities) Antibiotics (esp. Septra/Bactrim, Flagyl, Cipro) Amiodarone (Cordarone) Aspirin (ASA) Cimetidine (Tagamet) Megestrol (Megace) NSAIDs (ibuprofen, naproxen, etc.) Piroxicam (Feldene) Propafenone (Rythmol SR) Propranolol (Inderal) Isoniazid (INH) Posaconazole (Noxafil) Barbiturates (Phenobarbital) Carbamazepine (Tegretol) Chlordiazepoxide  (Librium) Cholestyramine (Questran) Griseofulvin Oral Contraceptives Rifampin Sucralfate (Carafate) Vitamin K   Coumadin (Warfarin) Major Herbal Interactions  Increased Warfarin Effect Decreased Warfarin Effect  Garlic Ginseng Ginkgo biloba Coenzyme Q10 Green tea St. Johns wort    Coumadin (Warfarin) FOOD Interactions  Eat a consistent number of servings per week of foods HIGH in Vitamin K (1 serving =  cup)  Collards (cooked, or boiled & drained) Kale (cooked, or boiled & drained) Mustard greens (cooked, or boiled & drained) Parsley *serving size only =  cup Spinach (cooked, or boiled & drained) Swiss chard (cooked, or boiled & drained) Turnip greens (cooked, or boiled & drained)  Eat a consistent number of servings per week of foods MEDIUM-HIGH in Vitamin K (1 serving = 1 cup)  Asparagus (cooked, or boiled & drained) Broccoli (cooked, boiled & drained, or raw & chopped) Brussel sprouts (cooked, or boiled & drained) *serving size only =  cup Lettuce, raw (green leaf, endive, romaine) Spinach, raw Turnip greens, raw & chopped   These websites have more information on Coumadin (warfarin):  FailFactory.se; VeganReport.com.au;

## 2013-09-25 NOTE — Progress Notes (Signed)
Patient seen and examined by me, discussed with resident team and I agree with resident assessment and plan.  [name], MD

## 2013-09-25 NOTE — Discharge Summary (Signed)
FMTS Attending Note Patient seen and examined by me with patient's wife Collie Siad at bedside.  He is feeling well today. He and wife are interested in enrolling in hospice care.  We discussed his advanced directives wishes and completed the MOST form at bedside.  The original pink form was given to patient and wife to post at home in case of EMS response to the home; discussed its transferability between/among environments of care.  A copy of the form was made and submitted for medical records to scan into his EMR record.   In summary, he does not wish for CPR/intubation/defibrillation to be attempted.  Would not want to be transported to an acute-care hospital.  Goals of care are comfort.  Would allow for limited trial of intravenous fluids, antibiotics. Does not wish for feeding tube to be placed.  Plan to discharge to home after meeting with Palliative Care service today.  He will be discharged on warfarin 5mg  nightly, with INR check and H/H on Thursday, March 5th.  He has a physician hospital follow up visit on Friday, March 6th. I will continue being his primary physician with the support of hospice physician consultants for symptom control.   [name], MD

## 2013-09-25 NOTE — Progress Notes (Signed)
ANTICOAGULATION CONSULT NOTE - Follow Up Consult  Pharmacy Consult for Coumadin Indication: hx PE 03/2013  No Known Allergies  Patient Measurements: Height: 5\' 9"  (175.3 cm) Weight: 204 lb 2.3 oz (92.6 kg) IBW/kg (Calculated) : 70.7  Vital Signs: Temp: 97.4 F (36.3 C) (03/03 0505) Temp src: Oral (03/03 0505) BP: 110/46 mmHg (03/03 1028) Pulse Rate: 64 (03/03 1028)  Labs:  Recent Labs  09/23/13 1650 09/24/13 0531 09/25/13 0430  HGB  --  7.2* 7.2*  HCT  --  21.8* 21.4*  PLT  --  200 189  LABPROT  --   --  16.0*  INR  --   --  1.31  CREATININE 4.32* 4.54* 4.62*    Estimated Creatinine Clearance: 14.1 ml/min (by C-G formula based on Cr of 4.62).  Assessment:   Off Xarelto due to worsening renal function.  Last dose 2/27. Noted history of  epistaxis and hemoptysis while on Xarelto.   Lovenox given 2/28 and 3/1, but patient refused yesterday, therefore, it was discontinued.  No bridging planned.  Hgb low but stable, plts ok, INR 1.3 today.  Goal of Therapy:  INR ~2.0 Monitor platelets by anticoagulation protocol: Yes   Plan:   Coumadin 5 mg po x1  Daily PT/INR.  CBC at least every 3 days while inpatient.  Hughes Better, PharmD, BCPS Clinical Pharmacist Pager: [phone] 09/25/2013 11:15 AM

## 2013-09-25 NOTE — Telephone Encounter (Signed)
I was paged at 14:00 today by someone from Hospice (I am not sure of the name), we had an extensive discussion regarding the plan for Mr Marxen upon discharge.  I agree to being Mr Divelbiss's primary physician with hospice physicians as consultants for symptom control. I believe his terminal diagnosis should be listed as CHF  He should had a lab follow-up in [facility] on Thursday, March 5th for INR, CBC and BMet, and a hospital follow-up visit on Friday, March 6th in [facility].  We are managing his warfarin dosing as well. [name], MD

## 2013-09-25 NOTE — Telephone Encounter (Signed)
Margie with [facility] called to inform Dr Alen Blew that patient is a currently at [facility] and has informed the Palliative team and hospital that he does not want chemotherapy treatement anymore at this time.   Mssg given to Dr Alen Blew.

## 2013-09-25 NOTE — Care Management Note (Signed)
    Page 1 of 1   09/25/2013     12:10:21 PM   CARE MANAGEMENT NOTE 09/25/2013  Patient:  Todd [name], Todd [name]   Account Number:  [id number]  Date Initiated:  09/25/2013  Documentation initiated by:  Tomi Bamberger  Subjective/Objective Assessment:   dx chf, chronic kidney dz, anemia  admit- lives with spouse.     Action/Plan:   Anticipated DC Date:  09/25/2013   Anticipated DC Plan:  [place]  CM consult      PAC Choice  HOSPICE   Choice offered to / List presented to:  C-3 Spouse        HH arranged  HH-1 RN      [place] agency  [facility] [place]   Status of service:  Completed, signed off Medicare Important Message given?   (If response is "NO", the following Medicare IM given date fields will be blank) Date Medicare IM given:   Date Additional Medicare IM given:    Discharge Disposition:  [place]  Per UR Regulation:    If discussed at Long Length of Stay Meetings, dates discussed:    Comments:  09/25/13 [place], BSN [phone] patient lives with spouse, NCM received a referral for home health hospice,  wife chose [facility], referral made to [facility], NCM called [phone] and also called Margie .  Wife states they have oxygen at home with [facility], and they have a  wheel chair, rolling walker and a raised toilet seat.  Patient will be able to be transported by car, and wife has oxygen tank in car.

## 2013-09-25 NOTE — Telephone Encounter (Signed)
Evette from [facility] called stating that pt was discharged from [facility] today with hospice and need to confirm if Dr. Lindell Noe will be the attending and agree to care management from the Hospice physicians.  She need a call back ASAP today if possible.  Please call [phone].  Derl Barrow, RN

## 2013-09-25 NOTE — Progress Notes (Signed)
Nsg Discharge Note  Admit Date:  09/21/2013 Discharge date: 09/25/2013   [name] to be D/C'd Home per MD order.  AVS completed.  Copy for chart, and copy for patient signed, and dated. Patient/caregiver able to verbalize understanding.  Discharge Medication:   Medication List    STOP taking these medications       torsemide 20 MG tablet  Commonly known as:  DEMADEX     XARELTO 20 MG Tabs tablet  Generic drug:  Rivaroxaban      TAKE these medications       aspirin EC 81 MG tablet  Take 81 mg by mouth daily.     benzonatate 100 MG capsule  Commonly known as:  TESSALON  Take 1 capsule (100 mg total) by mouth 2 (two) times daily as needed for cough.     chlorhexidine 0.12 % solution  Commonly known as:  PERIDEX  Use as directed 15 mLs in the mouth or throat 2 (two) times daily.     CINNAMON PO  Take 1 tablet by mouth daily.     diclofenac sodium 1 % Gel  Commonly known as:  VOLTAREN  Apply 2 g topically 3 (three) times daily as needed (for pain).     diphenoxylate-atropine 2.5-0.025 MG per tablet  Commonly known as:  LOMOTIL  Take 2 tablets by mouth 4 (four) times daily as needed for diarrhea or loose stools.     doxazosin 8 MG tablet  Commonly known as:  CARDURA  Take 4 mg by mouth 2 (two) times daily.     ENSURE PO  Take 1 Can by mouth daily.     furosemide 80 MG tablet  Commonly known as:  LASIX  Take 1 tablet (80 mg total) by mouth 3 (three) times daily.     gabapentin 300 MG capsule  Commonly known as:  NEURONTIN  Take 300 mg by mouth every evening.     iron polysaccharides 150 MG capsule  Commonly known as:  NU-IRON  Take 1 capsule (150 mg total) by mouth daily.     isosorbide mononitrate 60 MG 24 hr tablet  Commonly known as:  IMDUR  Take 60 mg by mouth daily.     lidocaine-prilocaine cream  Commonly known as:  EMLA  Apply 1 application topically daily as needed (to port). For port     metoprolol succinate 50 MG 24 hr tablet  Commonly  known as:  TOPROL-XL  Take 1 tablet (50 mg total) by mouth daily. Take with or immediately following a meal.     multivitamin tablet  Take 1 tablet by mouth every evening.     mupirocin cream 2 %  Commonly known as:  BACTROBAN  Apply 1 application topically 2 (two) times daily as needed (for skin irritation around stoma).     nitroGLYCERIN 0.4 MG SL tablet  Commonly known as:  NITROSTAT  Place 0.4 mg under the tongue every 5 (five) minutes as needed for chest pain.     potassium chloride 10 MEQ tablet  Commonly known as:  K-DUR  Take 1 tablet (10 mEq total) by mouth daily.     simvastatin 40 MG tablet  Commonly known as:  ZOCOR  Take 20 mg by mouth every evening.     sodium chloride 0.65 % Soln nasal spray  Commonly known as:  OCEAN  Place 1 spray into both nostrils as needed for congestion.     SYSTANE OP  Place 1 drop into both  eyes daily.     vitamin E 400 UNIT capsule  Take 400 Units by mouth daily.     warfarin 5 MG tablet  Commonly known as:  COUMADIN  Take 1 tablet (5 mg total) by mouth daily.        Discharge Assessment: Filed Vitals:   09/25/13 1300  BP: 105/56  Pulse: 68  Temp: 97.4 F (36.3 C)  Resp: 18   Skin clean, dry and intact without evidence of skin break down, no evidence of skin tears noted. IV catheter discontinued intact. Site without signs and symptoms of complications - no redness or edema noted at insertion site, patient denies c/o pain - only slight tenderness at site.  Dressing with slight pressure applied.  D/c Instructions-Education: Discharge instructions given to patient/family with verbalized understanding. D/c education completed with patient/family including follow up instructions, medication list, d/c activities limitations if indicated, with other d/c instructions as indicated by MD - patient able to verbalize understanding, all questions fully answered. Patient instructed to return to ED, call 911, or call MD for any changes in  condition.  Patient escorted via [place], and D/C home via private auto.  Cyncere Ruhe, Elissa Hefty, RN 09/25/2013 3:13 PM

## 2013-09-25 NOTE — Progress Notes (Signed)
Notified by Devonne Doughty pt/family requesting services of [facility] ([facility]) at discharge; met with pt, wife and daughter Elnita Maxwell at bedside, pt and family voice they want a comfort approach to care moving forward - pt voiced he knows his kidneys are failing and does not want to undergo HD; spoke with Dr Lindell Noe who will be attending physician with [facility] and confirmed with Katie Director hsopice Dx: CHF; Information sent to [facility]; plan is for admission RN to see pt/family tomorrow Wednesday at 10:30 am - family is agreeable with this plan; per pt/wife they have O2 [employer] in the home and at they time they do not want any additional DME; wife has [facility] information and can review and discuss any additional DME once pt is at home;  Wife stated has O2 for travel transport and will plan to take pt home by personal vehicle [facility] and [facility] aware of above Please call with any questions Danton Sewer, RN  [phone]

## 2013-09-25 NOTE — Consult Note (Signed)
Patient Todd [name]      DOB: [birth year]      YQM:[id number]     Consult Note from the Palliative Medicine Team at [place] Requested by: Dr Thomas Hoff     PCP: Willeen Niece, MD Reason for Consultation:Clarification of [place] and options     Phone [phone]  Assessment of patients Current state:Todd [name] is a 80 y.o. male presenting with progressively worsening lethargy and lower extremity edema. He has been noted to have worsening renal function. PMH is significant for metastatic colon cancer; systolic HF with most recent EF 35-40% on ECHO 10 months ago. Also with history of VTE events; most recently PE diagnosed Sept 2014, has been maintained on Xarelto since that time in setting of recurrent VTEs and malignancy.   Continued physical and functional decline, preparing for EOL anticipatory care needs and decsions    Consult is for review of medical treatment options, clarification of goals of care and end of life issues, disposition and options, and symptom recommendation.  This NP Wadie Lessen reviewed medical records, received report from team, assessed the patient and then meet at the patient's bedside along with his wife  to discuss diagnosis prognosis, GOC, EOL wishes disposition and options.   A detailed discussion was had today regarding advanced directives.  Concepts specific to code status, artifical feeding and hydration, continued IV antibiotics and rehospitalization was had.  The difference between a aggressive medical intervention path  and a palliative comfort care path for this patient at this time was had.  Values and goals of care important to patient and family were attempted to be elicited.  Concept of Hospice and Palliative Care were discussed  Natural trajectory and expectations at EOL were discussed.  Questions and concerns addressed.  Hard Choices booklet left for review. Family encouraged to call with questions or concerns.  PMT will continue to  support holistically.   Goals of Care: 1.  Code Status:DNR/DNI-comfort is main focus of care   2. Scope of Treatment: 1. Vital Signs: per unit  2. Respiratory/Oxygen:for comfort only 3. Nutritional Support/Tube Feeds:no artifical feeding now or in the future 4. Antibiotics: to decide when infection occurs 5. IVF:for trial period of time 6. Telemetry:none   4. Disposition:  Home with hospice services, will write for choice   3. Symptom Management:   1.  Weakness/Fatigue:  Continued symptom management, activity as tolerated, rest as needed, diet as tolerated    4. Psychosocial:  Emotional support offered to patient and his wife, both understand the overall poor prognosis and hope for some "good quality days"    Patient Documents Completed or Given: Document Given Completed  Advanced Directives Pkt    MOST  yes  DNR    Gone from My Sight    Hard Choices      Brief BMW:Todd [name] is a 80 y.o. male presenting with progressively worsening lethargy and lower extremity edema. He has been noted to have worsening renal function (baseline Cr @1 .0; most recent Cr 2.92) . PMH is significant for metastatic colon cancer; systolic HF with most recent EF 35-40% on ECHO 10 months ago. Also with history of VTE events; most recently PE diagnosed Sept 2014, has been maintained on Xarelto since that time in setting of recurrent VTEs and malignancy.    ROS:  Weakness, fatigue    PMH:  Past Medical History  Diagnosis Date  . Myocardial infarction 1990    "Massive"- total LAD with collaterals  .  Gout   . Hypertension   . Arthritis, rheumatoid   . Diabetes mellitus without complication   . CHF (congestive heart failure)   . BPH (benign prostatic hyperplasia)   . Neuropathy   . Hyperlipidemia   . Ischemic cardiomyopathy March 2012    EF 31% Myoview. Pt declines ICD  . Colon cancer 2014    Surg, chemo March 2014  . Mitral regurgitation, mod to severe 09/2012 echo 09/21/2013      PSH: Past Surgical History  Procedure Laterality Date  . Cataract extraction    . Flexible sigmoidoscopy N/A 09/05/2012    Procedure: FLEXIBLE SIGMOIDOSCOPY;  Surgeon: Missy Sabins, MD;  Location: [facility];  Service: Endoscopy;  Laterality: N/A;  Give 1000 cc tapwater enema on call to endoscopy.  . Colon resection N/A 09/07/2012    Procedure: LAPAROSCOPIC ASSISTED SIGMOID COLON RESECTION END OSTOMY;  Surgeon: Shann Medal, MD;  Location: [place];  Service: General;  Laterality: N/A;  . Portacath placement N/A 09/07/2012    Procedure: Placement of power port;  Surgeon: Shann Medal, MD;  Location: [place];  Service: General;  Laterality: N/A;   I have reviewed the [place] and SH and  If appropriate update it with new information. No Known Allergies Scheduled Meds: . aspirin EC  81 mg Oral Daily  . coumadin book   Does not apply Once  . doxazosin  4 mg Oral BID  . ferric gluconate (FERRLECIT/NULECIT) IV  125 mg Intravenous Once per day on Tue Thu Sat  . furosemide  80 mg Oral TID  . gabapentin  300 mg Oral Daily  . isosorbide mononitrate  60 mg Oral Daily  . metoprolol succinate  50 mg Oral Daily  . simvastatin  20 mg Oral QPM  . sodium chloride  3 mL Intravenous Q12H  . warfarin  5 mg Oral q1800  . warfarin   Does not apply Once  . Warfarin - Pharmacist Dosing Inpatient   Does not apply q1800   Continuous Infusions:  PRN Meds:.sodium chloride, benzonatate, sodium chloride, sodium chloride, zolpidem    BP 110/46  Pulse 64  Temp(Src) 97.4 F (36.3 C) (Oral)  Resp 18  Ht 5\' 9"  (1.753 m)  Wt 204 lb 2.3 oz (92.6 kg)  BMI 30.13 kg/m2  SpO2 96%   PPS:50%   Intake/Output Summary (Last 24 hours) at 09/25/13 1214 Last data filed at 09/25/13 1029  Gross per 24 hour  Intake    323 ml  Output   1500 ml  Net  -1177 ml    Physical Exam:  General: chronically ill appearing, NAD HEENT: mm, no exudate noted Chest:   CTA CVS: RRR Abdomen:soft NT +BS, noted colostomy, stoma  unremarkable Ext: without edema Neuro: alert an oriented, moves all 4 extremities  Labs: CBC    Component Value Date/Time   WBC 7.0 09/25/2013 0430   WBC 10.5* 08/21/2013 0830   RBC 2.32* 09/25/2013 0430   RBC 3.50* 08/21/2013 0830   HGB 7.2* 09/25/2013 0430   HGB 10.4* 08/21/2013 0830   HCT 21.4* 09/25/2013 0430   HCT 32.2* 08/21/2013 0830   PLT 189 09/25/2013 0430   PLT 198 08/21/2013 0830   MCV 92.2 09/25/2013 0430   MCV 92.0 08/21/2013 0830   MCH 31.0 09/25/2013 0430   MCH 29.7 08/21/2013 0830   MCHC 33.6 09/25/2013 0430   MCHC 32.3 08/21/2013 0830   RDW 18.3* 09/25/2013 0430   RDW 16.7* 08/21/2013 0830   LYMPHSABS  0.6* 08/31/2013 1757   LYMPHSABS 1.2 08/21/2013 0830   MONOABS 0.5 08/31/2013 1757   MONOABS 0.8 08/21/2013 0830   EOSABS 0.2 08/31/2013 1757   EOSABS 0.6* 08/21/2013 0830   BASOSABS 0.0 08/31/2013 1757   BASOSABS 0.0 08/21/2013 0830    BMET    Component Value Date/Time   NA 141 09/25/2013 0430   NA 140 08/21/2013 0831   K 3.8 09/25/2013 0430   K 3.5 08/21/2013 0831   CL 99 09/25/2013 0430   CL 105 01/10/2013 0831   CO2 25 09/25/2013 0430   CO2 26 08/21/2013 0831   GLUCOSE 96 09/25/2013 0430   GLUCOSE 155* 08/21/2013 0831   GLUCOSE 124* 01/10/2013 0831   BUN 79* 09/25/2013 0430   BUN 27.3* 08/21/2013 0831   CREATININE 4.62* 09/25/2013 0430   CREATININE 2.92* 09/19/2013 1337   CREATININE 1.0 08/21/2013 0831   CALCIUM 8.7 09/25/2013 0430   CALCIUM 9.3 08/21/2013 0831   GFRNONAA 11* 09/25/2013 0430   GFRAA 12* 09/25/2013 0430    CMP     Component Value Date/Time   NA 141 09/25/2013 0430   NA 140 08/21/2013 0831   K 3.8 09/25/2013 0430   K 3.5 08/21/2013 0831   CL 99 09/25/2013 0430   CL 105 01/10/2013 0831   CO2 25 09/25/2013 0430   CO2 26 08/21/2013 0831   GLUCOSE 96 09/25/2013 0430   GLUCOSE 155* 08/21/2013 0831   GLUCOSE 124* 01/10/2013 0831   BUN 79* 09/25/2013 0430   BUN 27.3* 08/21/2013 0831   CREATININE 4.62* 09/25/2013 0430   CREATININE 2.92* 09/19/2013 1337   CREATININE 1.0 08/21/2013 0831   CALCIUM 8.7  09/25/2013 0430   CALCIUM 9.3 08/21/2013 0831   PROT 6.9 09/21/2013 1525   PROT 6.9 08/21/2013 0831   ALBUMIN 2.7* 09/21/2013 1525   ALBUMIN 2.8* 08/21/2013 0831   AST 22 09/21/2013 1525   AST 24 08/21/2013 0831   ALT 8 09/21/2013 1525   ALT 25 08/21/2013 0831   ALKPHOS 42 09/21/2013 1525   ALKPHOS 54 08/21/2013 0831   BILITOT 0.9 09/21/2013 1525   BILITOT 0.54 08/21/2013 0831   GFRNONAA 11* 09/25/2013 0430   GFRAA 12* 09/25/2013 0430     Time In Time Out Total Time Spent with Patient Total Overall Time  1100 1215 70 min 75 min    Greater than 50%  of this time was spent counseling and coordinating care related to the above assessment and plan.  Wadie Lessen NP  Palliative Medicine Team Team Phone # [phone] Pager [phone]  Discussed with Case Management RN

## 2013-09-25 NOTE — Progress Notes (Signed)
Family Medicine Teaching Service Daily Progress Note Intern Pager: [phone]  Patient name: Todd [name] Medical record number: [id number] Date of birth: September 12, 1929 Age: 80 y.o. Gender: male  Primary Care Provider: Willeen Niece, MD Consultants: Cardiology, Nephrology Code Status: FULL  Pt Overview and Major Events to Date:  2/27 chest xray- possible PNA, small right effusion 3/1- creatinine increasing with diuresis; continue regimen; pt not wishing to start dialysis- will pursue palliative care 3/2- IV Lasix switched to 80mg  po TID; Lovenox d/c and started on Coumadin 5 mg qhs;  3/3:Palliative care meeting scheduled for @ 1100, Discussion with PCP- patient wants to be DNR   Assessment and Plan: Derrell Milanes is a 80 y.o. male who presented with lethargy, worsening dyspnea and increasing serum Cr. He appeared initially volume-overloaded and is improving.   1. CHF exacerbation - Dyspnea improving, net negative fluid status - Echo with combined CHF with Ef 52-77%, Grade 2 diastolic, and known mod to severe MR - Continue Lasix 80 mg PO TID per nephro - Negative 0.9 L in last 24 hrs; Net negative 4.9 L since admission  2. Recent Hx of PE in the setting of metastatic colon cancer:  - Last event in September of 2014 - D/C Xarelto, due to kidney function this was discontinued  - D/C Lovenox yest. 3/2 - Started on Coumadin 5 mg with INR goal of as close to 2 as possible - INR at 1.31 today - Will receive outpatient INR check in 2 days in clinic  3. AKI: use of ACEi and NSAIDs.  - Nephrology consulted - following recommendations.  - Cr trending up, 3.22>4.32>4.54>4.62 - Not a dialysis candidate due to preference and poor prognosis - Continue PO Lasix  4. Anemia:  - Pt with prior hx of epistaxis and hemoptysis while on Xarelto  - s/p ferraheme - Hgb Stable at 7.2 today, asx - FOBT negative - Outpatient Hb check later this week   5. Recent PNA treated with abx and pt finished course.  No fever, normal WBC, occasional cough and has had intermittent hemoptysis and epistaxis while on anticoagulation.  - Findings on CXR can be residual for prior process or his pulmonary congestion. Will monitor clinically and repeat CXR or start abx if needed.  - Continue home oxygen   5. Metastatic colon cancer: no obstruction on imaging. Colostomy patent. - Has been getting palliative chemo, s/p colon resection - Combined with worsening renal function, palliative care consulted and will be seeing patient today at [place] and PT ordered - Case management seeing today   Other chronic conditions: HTN: controlled on metoprolol and now on furosemide HLD: on simvastatin 20   FEN/GI: HH diet PPx: Coumadin  Disposition: Anticipate discharge home today due to clinical improvement of fluid overload.   Subjective:  Feeling well this am. Tolerating PO easily. States he feels his UOP is good and swelling is continued to improve.   Objective: Temp:  [97.4 F (36.3 C)-97.9 F (36.6 C)] 97.4 F (36.3 C) (03/03 0505) Pulse Rate:  [64-68] 64 (03/03 1028) Resp:  [18] 18 (03/03 0505) BP: (110-134)/(46-68) 110/46 mmHg (03/03 1028) SpO2:  [96 %-97 %] 96 % (03/03 0505) Weight:  [204 lb 2.3 oz (92.6 kg)] 204 lb 2.3 oz (92.6 kg) (03/03 0505)  Physical Exam: Gen: Pleasant male in NAD, alert, cooperative with exam HEENT: NCAT, MMM CV: RRR,  II-III/VI systolic murmur, Resp: soft crackle at BL bases, normal work of breathing, no wheezes Abd: SNTND, BS  present, no guarding or organomegaly; colostomy bag on L abdomen  Ext: 2+ LE pitting edema to knees, much improved from yesterday Neuro: Alert and oriented, No gross deficits  Laboratory:  Recent Labs Lab 09/22/13 0555 09/24/13 0531 09/25/13 0430  WBC 9.4 8.1 7.0  HGB 7.5* 7.2* 7.2*  HCT 22.7* 21.8* 21.4*  PLT 215 200 189    Recent Labs Lab 09/21/13 1525  09/23/13 1650 09/24/13 0531 09/25/13 0430  NA 138  < > 137 137  141  K 4.2  < > 3.8 4.2 3.8  CL 99  < > 98 97 99  CO2 25  < > 26 26 25   BUN 56*  < > 70* 73* 79*  CREATININE 3.22*  < > 4.32* 4.54* 4.62*  CALCIUM 8.8  < > 8.8 8.8 8.7  PROT 6.9  --   --   --   --   BILITOT 0.9  --   --   --   --   ALKPHOS 42  --   --   --   --   ALT 8  --   --   --   --   AST 22  --   --   --   --   GLUCOSE 186*  < > 123* 103* 96  < > = values in this interval not displayed. Imaging/Diagnostic Tests: CXR 2/27 1. Increased opacity at the bases may represent pneumonia as well as  atelectasis. Recommend continued followup.  2. Cardiomegaly with pulmonary vascular congestion and small right  effusion. Possible mild CHF   Timmothy Euler, MD 09/25/2013, 2:59 PM [place]

## 2013-09-25 NOTE — Progress Notes (Signed)
Stable. With minimal rise in creatinine and decent UOP on Po furosemide.  Edema about the same.  Would continue with current PO dose of furosemide and adjust as needed. His PCP will do this, but we can see if neede.  Hospice care will be involved so not sure of our value. Todd [name]

## 2013-09-25 NOTE — Progress Notes (Signed)
Patient Name: Todd [name] Date of Encounter: 09/25/2013  Principal Problem:   Acute on chronic combined systolic and diastolic congestive heart failure Active Problems:   CAD, Ant MI 1990- total LAD with collaterals, Myoview low risk 3/12   Colon cancer metastasized to liver   Acute pulmonary embolism- 04/02/13   Acute kidney injury   Hemoptysis   CHF (congestive heart failure)   Mitral regurgitation, mod to severe 09/2012 echo    SUBJECTIVE: Breathing OK, no more hemoptysis, no melena, is going home today per IM. Feels OK with this. Still using O2 at times.  OBJECTIVE Filed Vitals:   09/24/13 2047 09/25/13 0505 09/25/13 1022 09/25/13 1028  BP: 120/64 134/68 110/46 110/46  Pulse: 68 67  64  Temp: 97.9 F (36.6 C) 97.4 F (36.3 C)    TempSrc: Oral Oral    Resp: 18 18    Height:      Weight:  204 lb 2.3 oz (92.6 kg)    SpO2: 97% 96%      Intake/Output Summary (Last 24 hours) at 09/25/13 1214 Last data filed at 09/25/13 1029  Gross per 24 hour  Intake    323 ml  Output   1500 ml  Net  -1177 ml   Filed Weights   09/23/13 0421 09/24/13 0456 09/25/13 0505  Weight: 206 lb 9.1 oz (93.7 kg) 206 lb 9.1 oz (93.7 kg) 204 lb 2.3 oz (92.6 kg)    PHYSICAL EXAM General: Well developed, well nourished, male in no acute distress. Head: Normocephalic, atraumatic.  Neck: Supple without bruits, JVD 8 cm. Lungs:  Resp regular and unlabored, few rhonchi. Heart: RRR, S1, S2, no S3, S4, 2/6 murmur; no rub. Abdomen: Soft, non-tender, non-distended, BS + x 4.  Extremities: No clubbing, cyanosis, 1+ LE edema.  Neuro: Alert and oriented X 3. Moves all extremities spontaneously. Psych: Normal affect.  TELE: SR with PVCs  LABS: CBC: Recent Labs  09/24/13 0531 09/25/13 0430  WBC 8.1 7.0  HGB 7.2* 7.2*  HCT 21.8* 21.4*  MCV 92.8 92.2  PLT 200 189   INR: Recent Labs  09/25/13 0430  INR 5.09   Basic Metabolic Panel: Recent Labs  09/23/13 1650 09/24/13 0531  09/25/13 0430  NA 137 137 141  K 3.8 4.2 3.8  CL 98 97 99  CO2 26 26 25   GLUCOSE 123* 103* 96  BUN 70* 73* 79*  CREATININE 4.32* 4.54* 4.62*  CALCIUM 8.8 8.8 8.7  MG 2.1  --   --    BNP: Pro B Natriuretic peptide (BNP)  Date/Time Value Ref Range Status  09/21/2013  3:25 PM 13493.0* 0 - 450 pg/mL Final  09/02/2013 12:15 PM 9281.0* 0 - 450 pg/mL Final   Current Medications:  . aspirin EC  81 mg Oral Daily  . coumadin book   Does not apply Once  . doxazosin  4 mg Oral BID  . ferric gluconate (FERRLECIT/NULECIT) IV  125 mg Intravenous Once per day on Tue Thu Sat  . furosemide  80 mg Oral TID  . gabapentin  300 mg Oral Daily  . isosorbide mononitrate  60 mg Oral Daily  . metoprolol succinate  50 mg Oral Daily  . simvastatin  20 mg Oral QPM  . sodium chloride  3 mL Intravenous Q12H  . warfarin  5 mg Oral q1800  . warfarin   Does not apply Once  . Warfarin - Pharmacist Dosing Inpatient   Does not apply [phone]  ASSESSMENT AND PLAN: Principal Problem:   Acute on chronic combined systolic and diastolic congestive heart failure - Diuretics per renal team. Palliative care has seen, he has no interest in HD. He is on ASA/BB/statin/nitrates    Hx PE on coumadin - managed by primary MD      Ao sclerosis vs stenosis - mean gradient 11 mm Hg, peak gradient 20 mm Hg, follow  Otherwise per primary MD, have made f/u appt with Dr. Gwenlyn Found. Active Problems:   Anemia   CAD, Ant MI 1990- total LAD with collaterals, Myoview low risk 3/12    Colon cancer metastasized to liver   Acute pulmonary embolism- 04/02/13   Acute kidney injury   Hemoptysis   CHF (congestive heart failure)   Mitral regurgitation, mod to severe 09/2012 echo   Signed, Rosaria Ferries , PA-C 12:14 PM 09/25/2013    Patient seen and examined. Agree with assessment and plan. Feeling better with plans for dc today. No further drop in Hb. No signs of volume overload. Ct 4.62 todayPt is discussing hospice. He does not wish  to pursue any further chemotherapy and has no desire for dialysis. Cardiology f/u with Dr. Emilio Math, MD, [facility] 09/25/2013 1:47 PM

## 2013-09-26 NOTE — Consult Note (Signed)
I have reviewed this case with our NP and agree with the Assessment and Plan as stated.  Jamiyah Dingley L. Glenora Morocho, MD MBA [facility] Palliative Medicine Team at [facility] Phone: [phone] Pager: [phone]   

## 2013-09-27 ENCOUNTER — Inpatient Hospital Stay: Payer: Medicare Other | Admitting: Family Medicine

## 2013-09-27 ENCOUNTER — Other Ambulatory Visit (INDEPENDENT_AMBULATORY_CARE_PROVIDER_SITE_OTHER)

## 2013-09-27 DIAGNOSIS — I509 Heart failure, unspecified: Secondary | ICD-10-CM

## 2013-09-27 DIAGNOSIS — I2782 Chronic pulmonary embolism: Secondary | ICD-10-CM

## 2013-09-27 LAB — CBC
HEMATOCRIT: 23.7 % — AB (ref 39.0–52.0)
Hemoglobin: 7.6 g/dL — ABNORMAL LOW (ref 13.0–17.0)
MCH: 29.6 pg (ref 26.0–34.0)
MCHC: 32.1 g/dL (ref 30.0–36.0)
MCV: 92.2 fL (ref 78.0–100.0)
Platelets: 220 10*3/uL (ref 150–400)
RBC: 2.57 MIL/uL — ABNORMAL LOW (ref 4.22–5.81)
RDW: 18.7 % — AB (ref 11.5–15.5)
WBC: 7.4 10*3/uL (ref 4.0–10.5)

## 2013-09-27 LAB — POCT INR: INR: 1.7

## 2013-09-27 NOTE — Progress Notes (Signed)
BMP & CBC drawn,  Pt is not having any bleeding or unusual bruises INR = 1.7  Pt has taken 5 mg warfarin - Mon, Tues, Wed;   Per Dr. Lindell Noe: pt to take 2.5 mg warfarin - Thurs, Fri, Sat, Sun;   Hospice to check on Monday [place], MLS

## 2013-09-28 ENCOUNTER — Other Ambulatory Visit: Payer: Self-pay | Admitting: *Deleted

## 2013-09-28 ENCOUNTER — Inpatient Hospital Stay: Payer: Medicare Other | Admitting: Family Medicine

## 2013-09-28 ENCOUNTER — Telehealth: Payer: Self-pay | Admitting: Family Medicine

## 2013-09-28 LAB — BASIC METABOLIC PANEL
BUN: 95 mg/dL — ABNORMAL HIGH (ref 6–23)
CHLORIDE: 100 meq/L (ref 96–112)
CO2: 30 meq/L (ref 19–32)
Calcium: 9.1 mg/dL (ref 8.4–10.5)
Creat: 4.49 mg/dL — ABNORMAL HIGH (ref 0.50–1.35)
GLUCOSE: 170 mg/dL — AB (ref 70–99)
POTASSIUM: 4.5 meq/L (ref 3.5–5.3)
SODIUM: 140 meq/L (ref 135–145)

## 2013-09-28 MED ORDER — METOPROLOL SUCCINATE ER 50 MG PO TB24: 50.0000 mg | ORAL_TABLET | Freq: Every day | ORAL | Status: AC

## 2013-09-28 NOTE — Telephone Encounter (Signed)
Teresa from [facility] called to a refill Rx to [facility] for metoprolol 50 mg tab, quantity last dispensed # 90.  Derl Barrow, RN

## 2013-09-28 NOTE — Telephone Encounter (Signed)
I called patient's home to report lab results and to inquire about his welfare.  Of note, his hemoglobin is slightly up from previous (7.6 now; was 7.2).  Left voice message.  JB

## 2013-10-01 ENCOUNTER — Telehealth: Payer: Self-pay | Admitting: Family Medicine

## 2013-10-01 MED ORDER — WARFARIN SODIUM 1 MG PO TABS: 2.0000 mg | ORAL_TABLET | Freq: Every day | ORAL | Status: AC

## 2013-10-01 NOTE — Telephone Encounter (Signed)
Called and spoke with patient's hospice nurse Precious Bard, cell [phone]), his INR today is 2.5.  He had taken loading dose of warfarin 5mg  x3 nights, then had INR here on March 5th that was 1.7, dose changed to 2.5mg  nightly x4 more nights.  INR done at home by hospice today at the upper limits of my goal for him (patient with PE Sept 2014 and colon cancer; also with recent hemoptysis and epistaxis).  My target for him is at 2, not to exceed 2.5.  PLAN to reduce warfarin dose to 2mg  daily, recheck  INR with Hospice on Friday, March 13th.  Precious Bard will call our lab [phone]) and report results.  I called patient's home to discuss with him, left voice message on his answering machine detailing the dose change and plans for recheck on Friday.  [name], MD

## 2013-10-03 ENCOUNTER — Telehealth: Payer: Self-pay | Admitting: Family Medicine

## 2013-10-03 ENCOUNTER — Encounter: Payer: Self-pay | Admitting: *Deleted

## 2013-10-03 NOTE — Telephone Encounter (Signed)
Pt called and would like Dr. Lindell Noe to give him a call.jw

## 2013-10-04 ENCOUNTER — Telehealth: Payer: Self-pay | Admitting: Family Medicine

## 2013-10-04 ENCOUNTER — Ambulatory Visit: Payer: Medicare Other | Admitting: Cardiology

## 2013-10-04 NOTE — Telephone Encounter (Signed)
Todd [name], nurse from Hospice, called with lab result:  INR 2.8  Per Dr. Lindell Noe - Change warfarin dose to 1 mg - daily;  Recheck Monday at scheduled visit; Todd [name], MLS (ASCP)cm

## 2013-10-04 NOTE — Telephone Encounter (Signed)
Will fwd to Dr.[name] . Thanks. Javier Glazier, Gerrit Heck

## 2013-10-05 NOTE — Telephone Encounter (Signed)
Attempted to call back at home number (only # listed in chart).  No answer after several rings, and no answering machine to leave voicemail. JB

## 2013-10-08 ENCOUNTER — Telehealth: Payer: Self-pay | Admitting: Family Medicine

## 2013-10-08 NOTE — Telephone Encounter (Signed)
Precious Bard, nurse from [facility], called with lab result:  INR 1.6  Advised to change warfarin dose to 2 mg - Mon & Thurs,  1 mg - all other days of week;  Recheck 1 week;  Dosed per Dr. [name] Aerilynn Goin [place], MLS (ASCP)cm

## 2013-10-12 NOTE — Telephone Encounter (Signed)
Opened in error.   Nakyia Dau, Loralyn [place], [place]

## 2013-10-15 ENCOUNTER — Telehealth: Payer: Self-pay | Admitting: Family Medicine

## 2013-10-15 NOTE — Telephone Encounter (Signed)
Todd [name], Hospice Nurse, called with INR 1.3 today 10-15-12;  Pt is on warfarin 2 mg - Mon & Thurs,  1 mg - other days;   Also question about Todd [name] reviewing pt's medications.   Reviewed with Dr. Lindell Noe - new warfarin dosing instructions;    Called Pattie, Hospice Nurse with NEW warfarin dose and recheck date.  She asked that I call pt with the new dose and acknowledged to check INR next Monday.  Called pt and wife with change of warfarin dose to 2 mg - daily,  Pattie will check INR next Monday.  Pt's wife, Todd [name] expressed understanding of new warfarin dose. Also discussed what they would like Todd [name] to look at with meds.  Relayed to Dr. Lindell Noe the pharmacy request to have Todd [name] review medications.  [place], MLS

## 2013-10-16 ENCOUNTER — Telehealth: Payer: Self-pay | Admitting: Pharmacist

## 2013-10-16 NOTE — Telephone Encounter (Signed)
Patient verbalzed thankfullness for the call.   He continues to lack pain.  He requested medication review for reevaluation of therapy and potential changes including optimization and simplification.    After discussing several medication therapies he verbalized interest in stopping his multivitamin, vitamin E and potentially his vitamin D.   He may also consider discontinuing his cinnamon.    Prescription therapies discussed included the potential D/C of his simvastatin.   We discussed his furosemide and his potassium supplementation.  As his lab monitoring is likely to be minimal at this time consideration for a change in his Potassium dosing may be appropriate when his renal output diminishes further.   He states his output is minimal even with the furosemide currently.   He was grateful for the call and he was encouraged to continue this his ever present positive attitude.

## 2013-10-22 ENCOUNTER — Telehealth: Payer: Self-pay | Admitting: Family Medicine

## 2013-10-22 NOTE — Telephone Encounter (Signed)
Todd [name], nurse from Hospice, called with lab result:  INR 1.4  Advised to change warfarin dose to  2.5 mg - Tues, Thurs, Sat;  2 mg - Sun, Mon, Wed, Fri; explained that entire dose is to be taken at the same time rather than splitting the dose.   Recheck 1 week;  Dosed per Dr. Lindell Noe;   Todd [name], MLS (ASCP)cm

## 2013-10-23 ENCOUNTER — Ambulatory Visit (INDEPENDENT_AMBULATORY_CARE_PROVIDER_SITE_OTHER): Admitting: Cardiovascular Disease

## 2013-10-23 ENCOUNTER — Encounter: Payer: Self-pay | Admitting: Cardiovascular Disease

## 2013-10-23 VITALS — BP 105/55 | HR 59 | Ht 69.5 in | Wt 201.0 lb

## 2013-10-23 DIAGNOSIS — N289 Disorder of kidney and ureter, unspecified: Secondary | ICD-10-CM

## 2013-10-23 DIAGNOSIS — N189 Chronic kidney disease, unspecified: Secondary | ICD-10-CM | POA: Insufficient documentation

## 2013-10-23 DIAGNOSIS — I255 Ischemic cardiomyopathy: Secondary | ICD-10-CM

## 2013-10-23 DIAGNOSIS — I2589 Other forms of chronic ischemic heart disease: Secondary | ICD-10-CM

## 2013-10-23 DIAGNOSIS — I10 Essential (primary) hypertension: Secondary | ICD-10-CM

## 2013-10-23 DIAGNOSIS — I2510 Atherosclerotic heart disease of native coronary artery without angina pectoris: Secondary | ICD-10-CM

## 2013-10-23 NOTE — Patient Instructions (Signed)
Follow up as needed

## 2013-10-23 NOTE — Progress Notes (Signed)
10/23/2013 Todd [name]   1929/10/03  [id number]  Primary Physician Willeen Niece, MD Primary Cardiologist: Lorretta Harp MD Renae Gloss   HPI:  Todd [name] is a 80 y.o. male 80 year old, mildly overweight, married Caucasian male, father of 17, grandfather to 5 grandchildren without is formerly a patient of Dr. Aldona Bar. Dr. Gwenlyn Found assumed his care in Dr. Eddie Dibbles absence. He has a history of ischemic heart disease status post anterior wall myocardial infarction in 1990, at which time he underwent cardiac catheterization revealing a total LAD with collaterals. His EF at that time was 30%. His other problems include hypertension and hyperlipidemia as well as gout. He was recently diagnosed with colon cancer and underwent resection by Dr. Alphonsa Overall with a colostomy. He has undergone chemotherapy administered by Dr. Alen Blew with reduced dose FOLFIRI with Avastin. A 2-D echo performed 09/2012 revealed an EF in the 35% range with wall motion abnormality consistent with his LAD occlusion. He had grade 2 diastolic dysfunction. His PA pk pressure was 51 mmHg. Hx 04/05/2013 for Lt pulmonary emboli on Xarelto. His dry weight is considered 200-205 pounds.  Recent hospitalization for HCAP 2/8-2/11. He saw Cecilie Kicks on 2/18 in the office. Patient notices worsening orthopnea, dyspnea, lower extremity edema with increased weight gain. He also continues to have hemoptysis which was noted during previous visit as well. Recent hemoglobin level was 8.7 and has decreased from 10.4 four weeks ago. Recent urinalysis also shows large amounts of Hgb. Home health RN noted decrease in oxygen saturations into the mid 80s with ambulation. He currently is on home O2. His serum creatinine has risen to the mid 4 range and he has declined hemodialysis. He is currently on home hospice.s.     Current Outpatient Prescriptions  Medication Sig Dispense Refill  . aspirin EC 81 MG tablet Take 81 mg by mouth daily.       . benzonatate (TESSALON) 100 MG capsule Take 1 capsule (100 mg total) by mouth 2 (two) times daily as needed for cough.  20 capsule  0  . chlorhexidine (PERIDEX) 0.12 % solution Use as directed 15 mLs in the mouth or throat 2 (two) times daily.       Marland Kitchen CINNAMON PO Take 1 tablet by mouth daily.      . diclofenac sodium (VOLTAREN) 1 % GEL Apply 2 g topically 3 (three) times daily as needed (for pain).      Marland Kitchen diphenoxylate-atropine (LOMOTIL) 2.5-0.025 MG per tablet Take 2 tablets by mouth 4 (four) times daily as needed for diarrhea or loose stools.      . doxazosin (CARDURA) 8 MG tablet Take 4 mg by mouth 2 (two) times daily.      . furosemide (LASIX) 80 MG tablet Take 1 tablet (80 mg total) by mouth 3 (three) times daily.  90 tablet  0  . gabapentin (NEURONTIN) 300 MG capsule Take 300 mg by mouth every evening.       . iron polysaccharides (NU-IRON) 150 MG capsule Take 1 capsule (150 mg total) by mouth daily.  30 capsule  5  . isosorbide mononitrate (IMDUR) 60 MG 24 hr tablet Take 60 mg by mouth daily.      Marland Kitchen lidocaine-prilocaine (EMLA) cream Apply 1 application topically daily as needed (to port). For port      . metoprolol succinate (TOPROL-XL) 50 MG 24 hr tablet Take 1 tablet (50 mg total) by mouth daily. Take with or immediately following a meal.  90  tablet  4  . Multiple Vitamin (MULTIVITAMIN) tablet Take 1 tablet by mouth every evening.       . mupirocin cream (BACTROBAN) 2 % Apply 1 application topically 2 (two) times daily as needed (for skin irritation around stoma).      . nitroGLYCERIN (NITROSTAT) 0.4 MG SL tablet Place 0.4 mg under the tongue every 5 (five) minutes as needed for chest pain.      . Nutritional Supplements (ENSURE PO) Take 1 Can by mouth daily.      [name] Glycol-Propyl Glycol (SYSTANE OP) Place 1 drop into both eyes daily.       . potassium chloride (K-DUR) 10 MEQ tablet Take 1 tablet (10 mEq total) by mouth daily.  30 tablet  4  . sodium chloride (OCEAN) 0.65 %  SOLN nasal spray Place 1 spray into both nostrils as needed for congestion.      . vitamin E 400 UNIT capsule Take 400 Units by mouth daily.      Marland Kitchen warfarin (COUMADIN) 1 MG tablet Take 2 tablets (2 mg total) by mouth daily.  60 tablet  3   No current facility-administered medications for this visit.    No Known Allergies  History   Social History  . Marital Status: Married    Spouse Name: N/A    Number of Children: 2  . Years of Education: N/A   Occupational History  . Retired, food and nutritional services    Social History Main Topics  . Smoking status: Former Smoker -- 1.00 packs/day    Quit date: 04/01/2009  . Smokeless tobacco: Never Used     Comment: Quit 45 years ago.  . Alcohol Use: No  . Drug Use: No  . Sexual Activity: Not Currently   Other Topics Concern  . Not on file   Social History Narrative   Emergency Contact:wife, Kiah Keay at [place]:   Who lives with you: Lives with wife in 1 story home.   Any pets: does not believe in them   Diet: Patient was the [employer] of food services here at [facility].  Has a varied diet and is currently trying to lose weight by decreasing protein and carbohydrates daily.   Exercise: water aerobix 3x a week for 45 mins and uses row bike 2 times a week for 20 minutes   Seatbelts: wears seatbelt while in vehicles   Sun Exposure/Protection: Patient reports wearing sun screen   Hobbies: numbers, cruises              Review of Systems: General: negative for chills, fever, night sweats or weight changes.  Cardiovascular: negative for chest pain, dyspnea on exertion, edema, orthopnea, palpitations, paroxysmal nocturnal dyspnea or shortness of breath Dermatological: negative for rash Respiratory: negative for cough or wheezing Urologic: negative for hematuria Abdominal: negative for nausea, vomiting, diarrhea, bright red blood per rectum, melena, or hematemesis Neurologic: negative for visual changes,  syncope, or dizziness All other systems reviewed and are otherwise negative except as noted above.    Blood pressure 105/55, pulse 59, height 5' 9.5" (1.765 m), weight 201 lb (91.173 kg).  General appearance: alert and no distress Neck: no adenopathy, no carotid bruit, no JVD, supple, symmetrical, trachea midline and thyroid not enlarged, symmetric, no tenderness/mass/nodules Lungs: clear to auscultation bilaterally Heart: regular rate and rhythm, S1, S2 normal, no murmur, click, rub or gallop Extremities: extremities normal, atraumatic, no cyanosis or edema  EKG not performed today  ASSESSMENT  AND PLAN:   Ischemic cardiomyopathy, EF 35-40% by echo March 2014 History of her remote myocardial infarction back in 1990 with known LAD occlusion an EF of 30%. He has moderate to severe mitral regurgitation. He has declined ICD therapy.  Essential hypertension Controlled on current medications  Chronic renal insufficiency The patient's serum creatinine has risen to the mid 4 range. He has declined hemodialysis. He is currently on home hospice.      Lorretta Harp MD FACP,FACC,FAHA, [facility] 10/23/2013 3:51 PM

## 2013-10-23 NOTE — Assessment & Plan Note (Signed)
The patient's serum creatinine has risen to the mid 4 range. He has declined hemodialysis. He is currently on home hospice.

## 2013-10-23 NOTE — Assessment & Plan Note (Signed)
Controlled on current medications 

## 2013-10-23 NOTE — Assessment & Plan Note (Signed)
History of her remote myocardial infarction back in 1990 with known LAD occlusion an EF of 30%. He has moderate to severe mitral regurgitation. He has declined ICD therapy.

## 2013-10-25 ENCOUNTER — Other Ambulatory Visit: Payer: Self-pay | Admitting: *Deleted

## 2013-10-29 ENCOUNTER — Telehealth: Payer: Self-pay | Admitting: Family Medicine
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
## 2013-11-01 ENCOUNTER — Other Ambulatory Visit: Payer: Self-pay

## 2013-11-02 ENCOUNTER — Telehealth: Payer: Self-pay | Admitting: Family Medicine

## 2013-11-05 ENCOUNTER — Telehealth: Payer: Self-pay | Admitting: Family Medicine

## 2013-11-05 ENCOUNTER — Telehealth: Payer: Self-pay | Admitting: *Deleted

## 2013-11-05 MED ORDER — FUROSEMIDE 80 MG PO TABS: 80.0000 mg | ORAL_TABLET | Freq: Three times a day (TID) | ORAL | Status: AC

## 2013-11-05 MED ORDER — BENZONATATE 100 MG PO CAPS: 100.0000 mg | ORAL_CAPSULE | Freq: Two times a day (BID) | ORAL | Status: AC | PRN

## 2013-11-05 NOTE — Telephone Encounter (Signed)
Pt's wife was calling to let Dr. Gwenlyn Found know that Mr. Yurchak has passed away. He passed on 11/23/2022.

## 2013-11-05 NOTE — Telephone Encounter (Signed)
Wife called to let Dr. Lindell Noe know that her husband Todd [name] passed away this weekend. jw

## 2013-11-05 NOTE — Telephone Encounter (Signed)
Wife Collie Siad calling,  patient expired 13-Nov-2013. She would like to speak to dr Alen Blew personally, to thank him for taking such good care of her husband and helping him to live another year with her. No future appts in epic. Note to dr Alen Blew.

## 2013-11-05 NOTE — Telephone Encounter (Signed)
I spoke with wife and expressed our sincere condolences!  Visitation is on Wed 4/15 from 6-8 at [facility] on [address]

## 2013-11-23 NOTE — Telephone Encounter (Signed)
Hospice call from [facility] to inform us that patient was pronounced dead at 7:59 pm. He was on hospice. On chart review, this was presumably for CHF, colon cancer metastasized to liver. Family is doing all right. Funeral home aware. Will fwd to PCP.  Hilton Sinclair, MD

## 2013-11-23 DEATH — deceased

## 2014-03-03 IMAGING — CR DG CHEST 2V
2 series · 2 of 2 positions shown · non-contrast
Comparison: None.

CLINICAL DATA: Fever and chills.  Shortness of breath.

CHEST - 2 VIEW

[w chest lat]
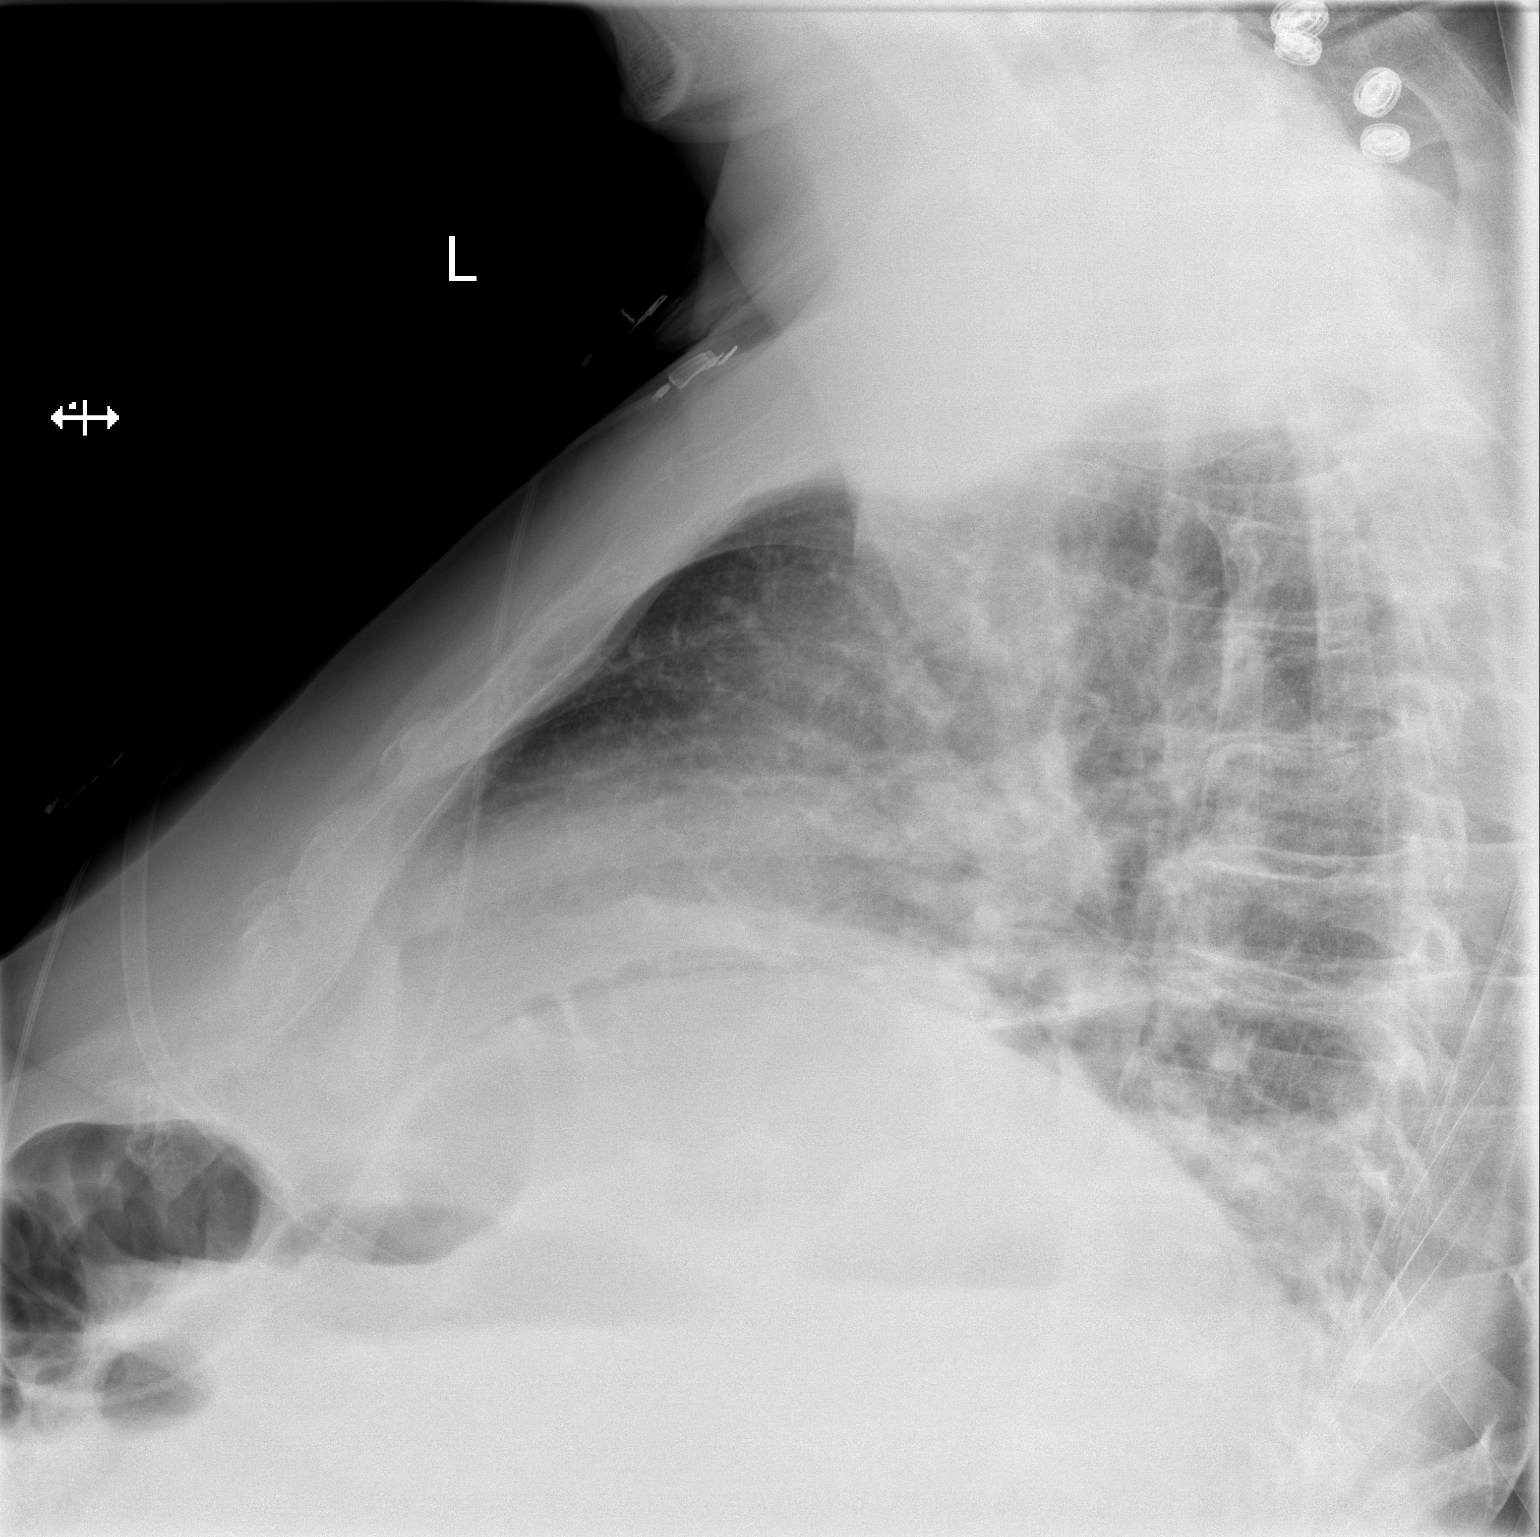

[x chest ap]
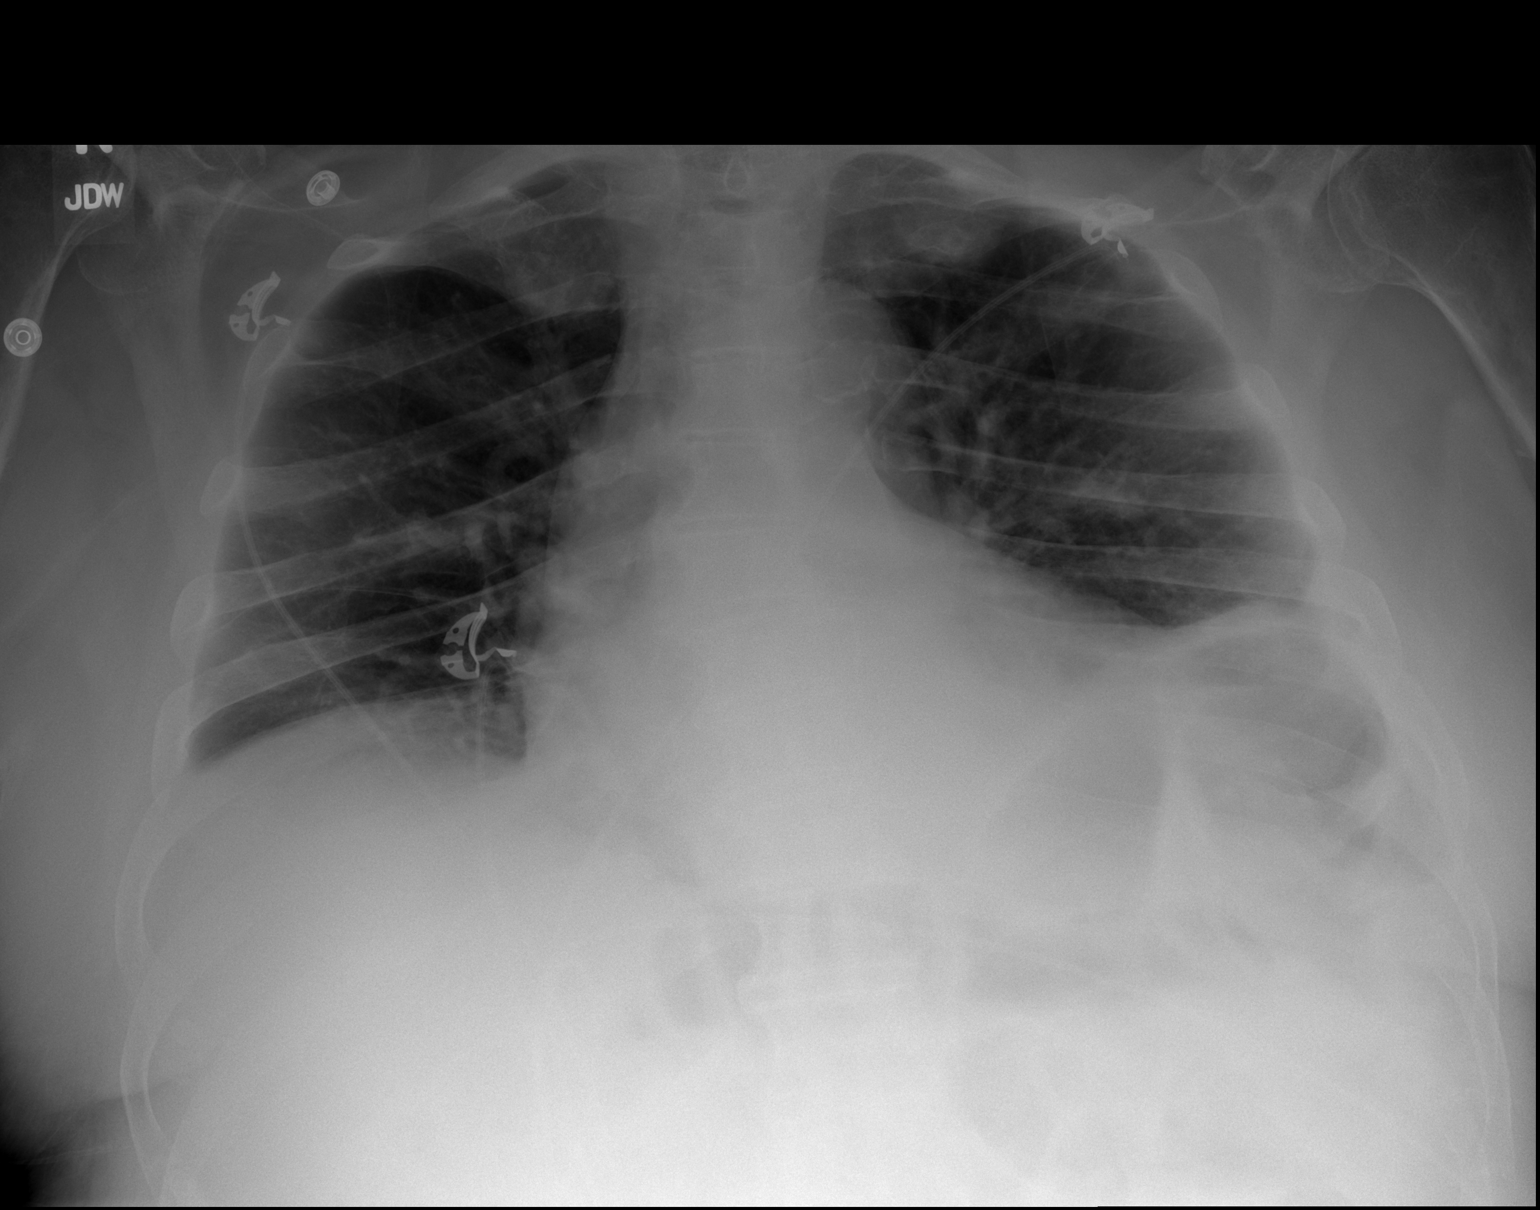

[2 of 2 positions shown; findings below may reference images not displayed]

FINDINGS: The heart size is exaggerated by low lung volumes.  Mild
bibasilar atelectasis is present.  Lower lobe airspace disease is
suggested on the lateral view.  This obscured by the low lung
volumes on the PA view.  No additional airspace consolidation is
present in the upper lung fields.  The AP view is moderately
lordotic.
IMPRESSION: 1.  Suspected lower lobe airspace disease may represent early
pneumonia.
2.  Low lung volumes and basilar atelectasis.
3.  Mild cardiomegaly.

## 2014-03-04 IMAGING — CR DG CHEST 1V PORT
1 series · 1 of 1 positions shown · non-contrast
Comparison: 09/06/2012.

CLINICAL DATA: Post port insertion

PORTABLE CHEST - 1 VIEW

[AP]
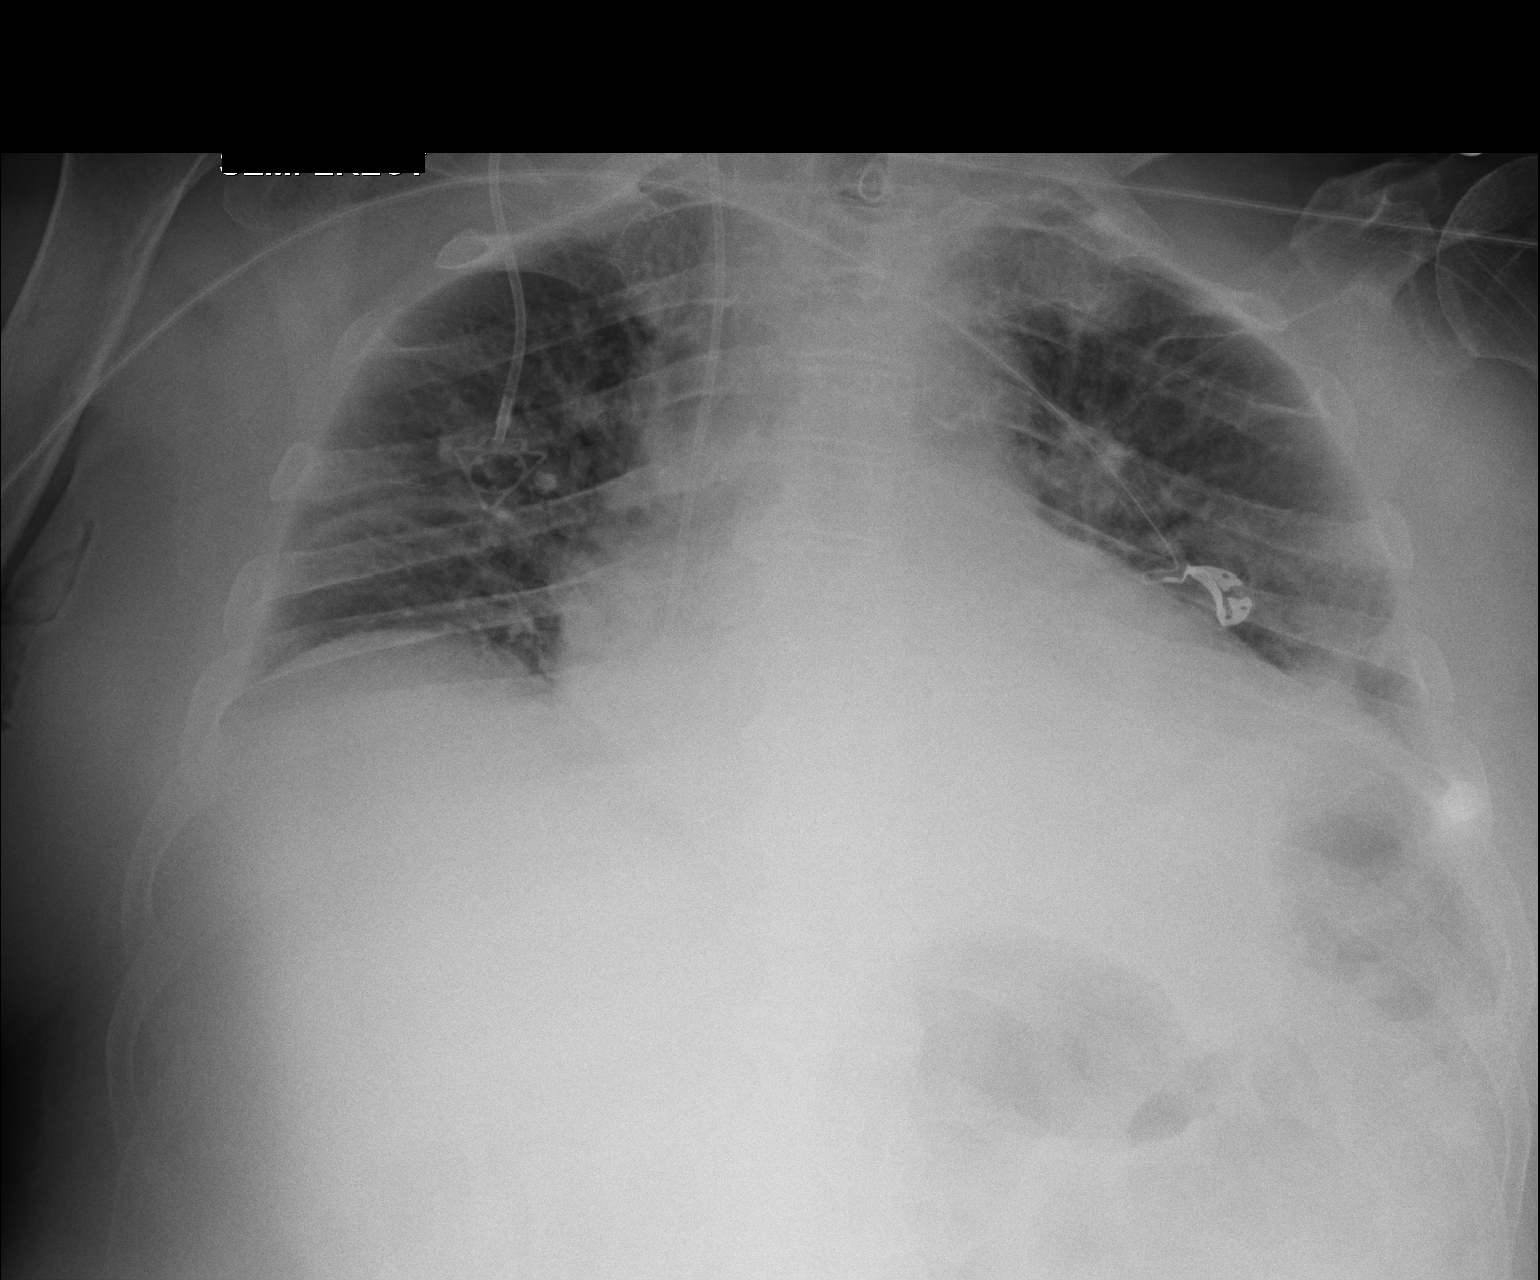

[1 of 1 positions shown; findings below may reference images not displayed]

FINDINGS: Right port has been placed.  The tip appears be at the
level of the cavoatrial junction and possibly proximal right
atrium.  This can be assessed on follow-up two-view exam when the
patient is able.

No gross pneumothorax.

Cardiomegaly.  Central pulmonary vascular congestion/prominence.

Left base subsegmental atelectasis.

Elevated hemidiaphragms more notable on the right secondary to poor
inspiration.

Calcified aorta with mild prominence of the mediastinum.
IMPRESSION: Right port has been placed.  The tip appears be at the level of the
cavoatrial junction and possibly proximal right atrium.  This can
be assessed on follow-up two-view exam when the patient is able.

This is a call report.

## 2014-03-22 ENCOUNTER — Other Ambulatory Visit: Payer: Self-pay | Admitting: Pharmacist

## 2015-03-02 IMAGING — US US RENAL
1 series · 14 of 25 positions shown · non-contrast
Comparison: CT, 06/05/2013

CLINICAL DATA: Worsening renal function.

EXAM:
RENAL/URINARY TRACT ULTRASOUND COMPLETE

[Series 1: us renal · 0.28mm/px · 14 of 44 slices shown]
[im 1/44]
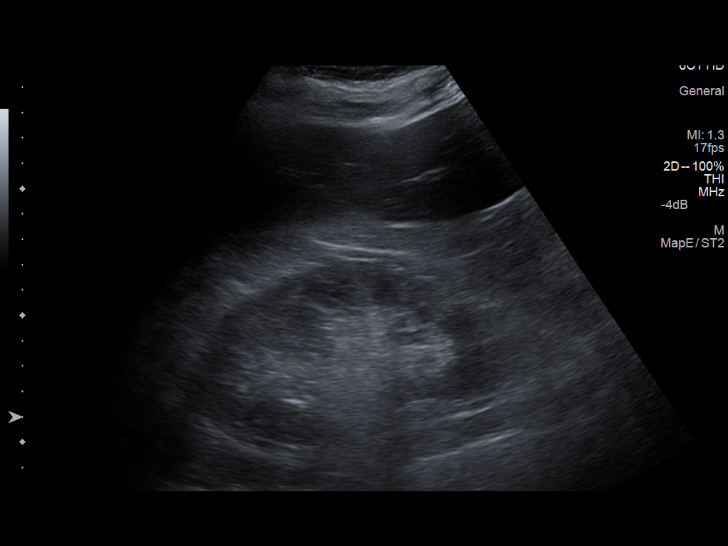
[im 4/44]
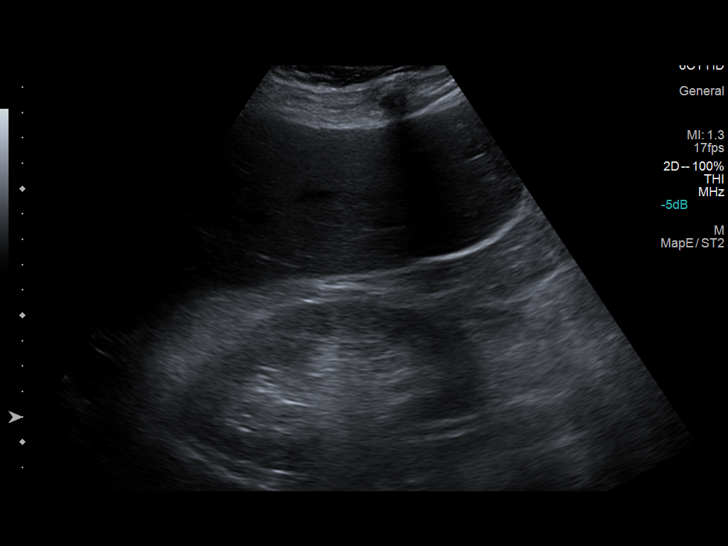
[im 8/44]
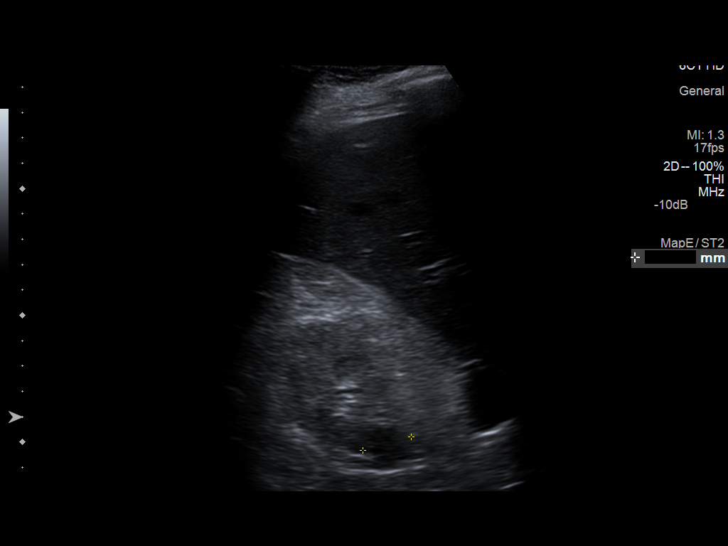
[im 11/44]
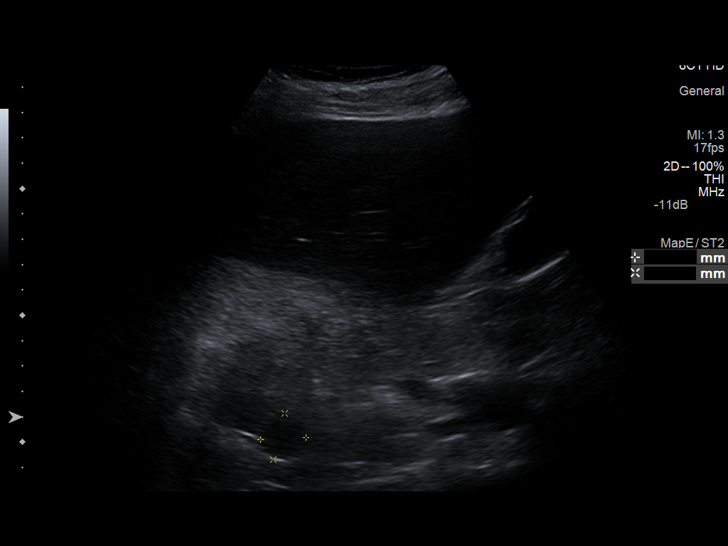
[im 15/44]
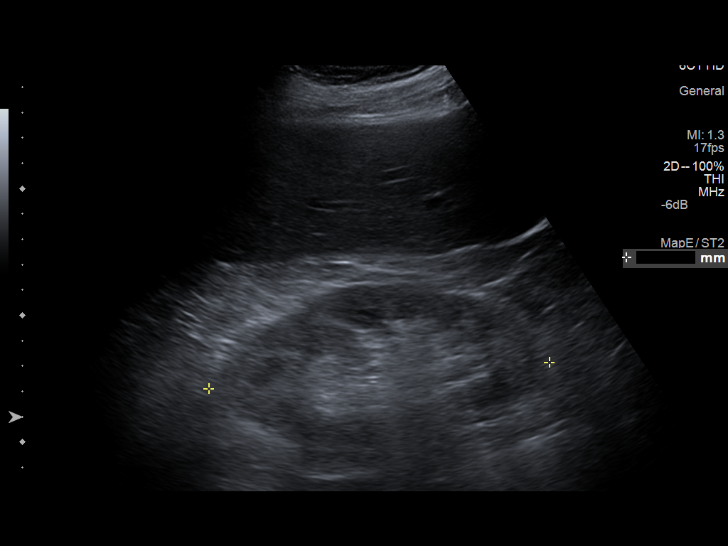
[im 17/44]
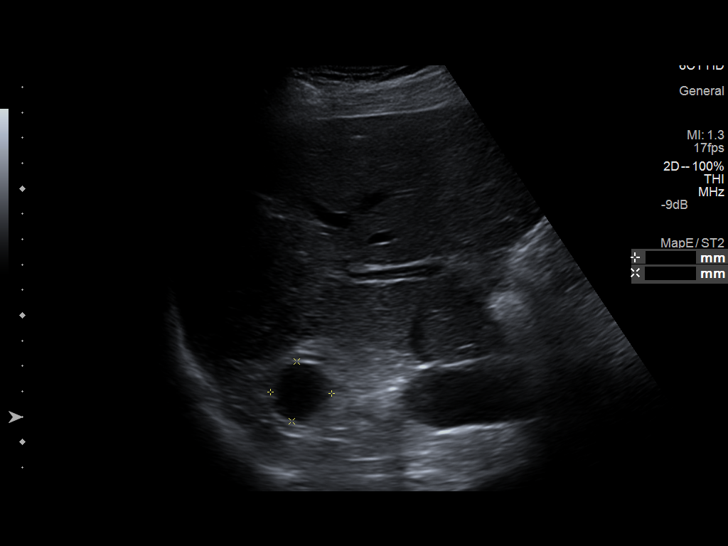
[im 20/44]
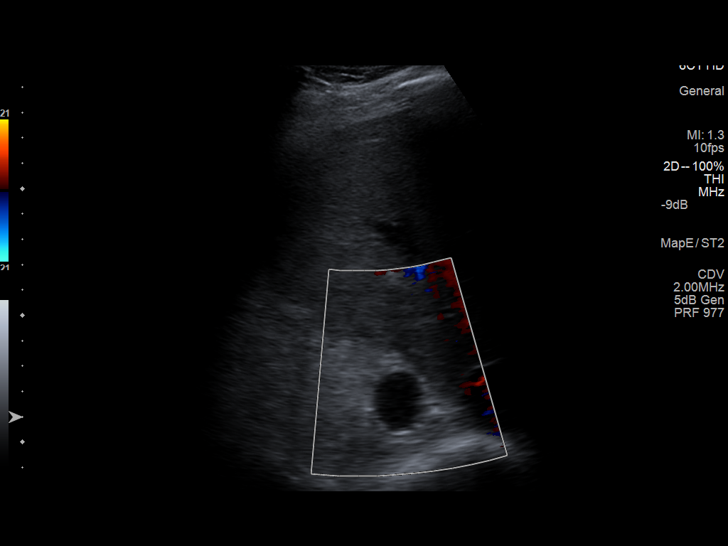
[im 24/44]
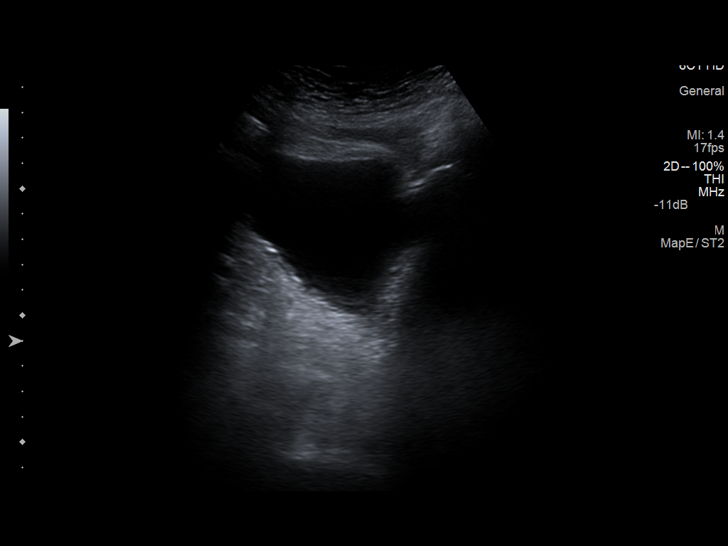
[im 27/44]
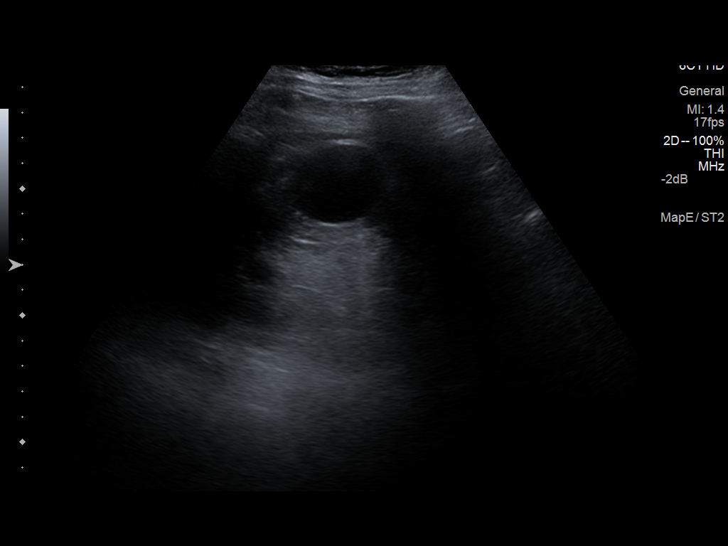
[im 29/44]
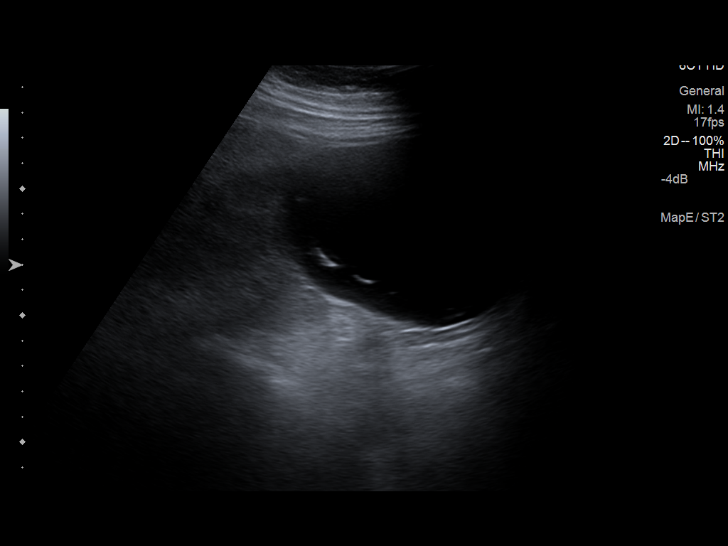
[im 33/44]
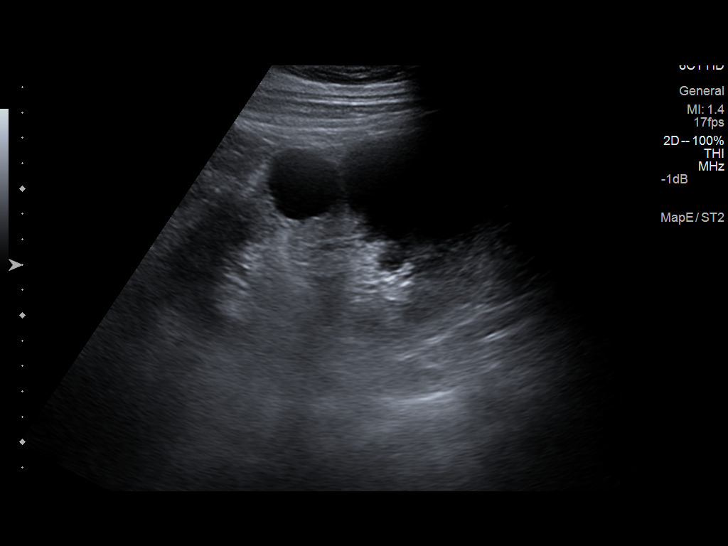
[im 36/44]
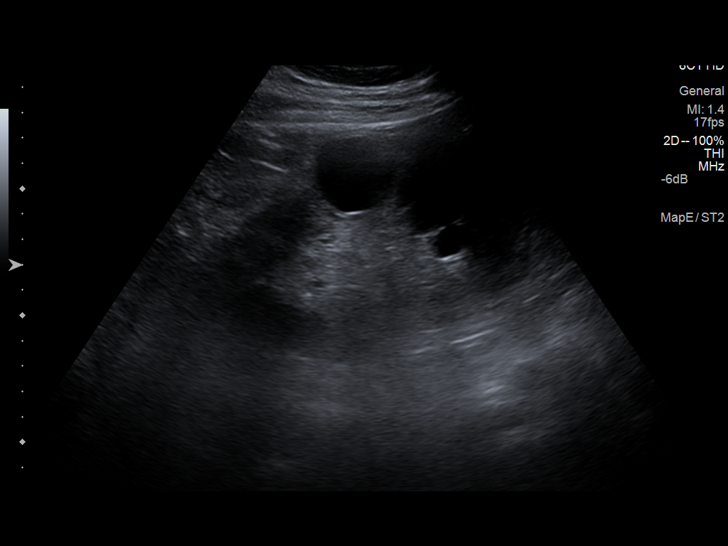
[im 40/44]
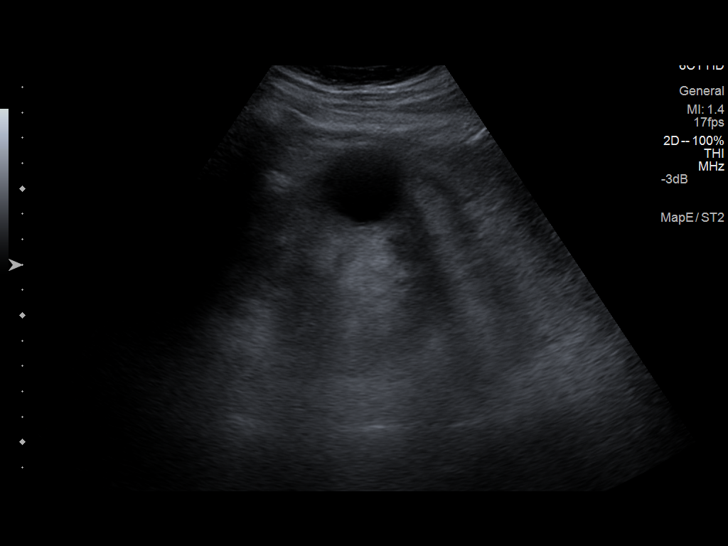
[im 44/44]
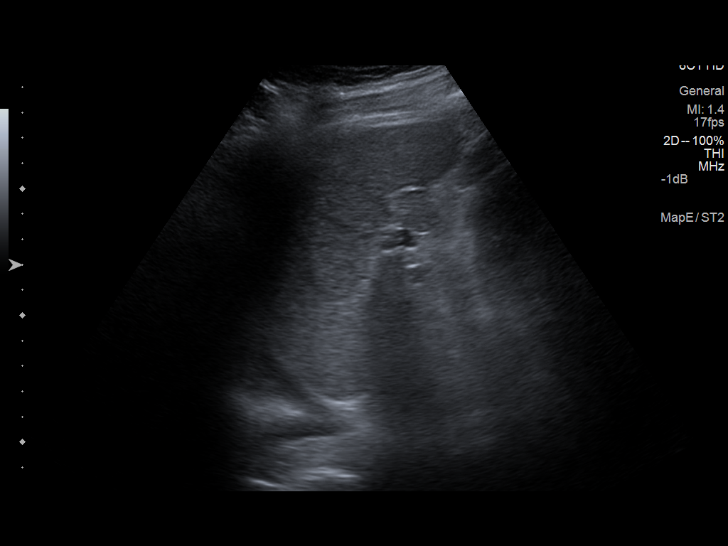

[14 of 25 positions shown; findings below may reference images not displayed]

FINDINGS: Right Kidney:

Length: 13.5 cm. There is increased renal parenchymal echogenicity.
No hydronephrosis. 18 mm cyst arises from the upper pole. No other
renal masses.

Left Kidney:

Length: 14.0 cm. Increased parenchymal echogenicity. No
hydronephrosis. There is a large complex cyst arising from the lower
pole measuring 10 cm in greatest dimension. It has relatively thin
septa along its posterior margin. It is stable. There is a smaller
cyst above this at the midpole measuring 3.8 cm. No other renal
masses.

Bladder:

Appears normal for degree of bladder distention.
IMPRESSION: 1. No hydronephrosis.
2. Increased renal parenchymal echogenicity consistent with medical
renal disease.
3. Small right renal cyst. 10 cm left lower pole complex renal cyst
and 3.8 cm midpole simple left renal cyst. The cysts are stable when
compared to the prior CT.

## 2015-03-18 IMAGING — CR DG CHEST 2V
3 series · 3 of 3 positions shown · non-contrast
Comparison: Chest x-ray of 09/12/2013

CLINICAL DATA: Shortness of breath, coughing up blood

EXAM:
CHEST  2 VIEW

[w chest pa]
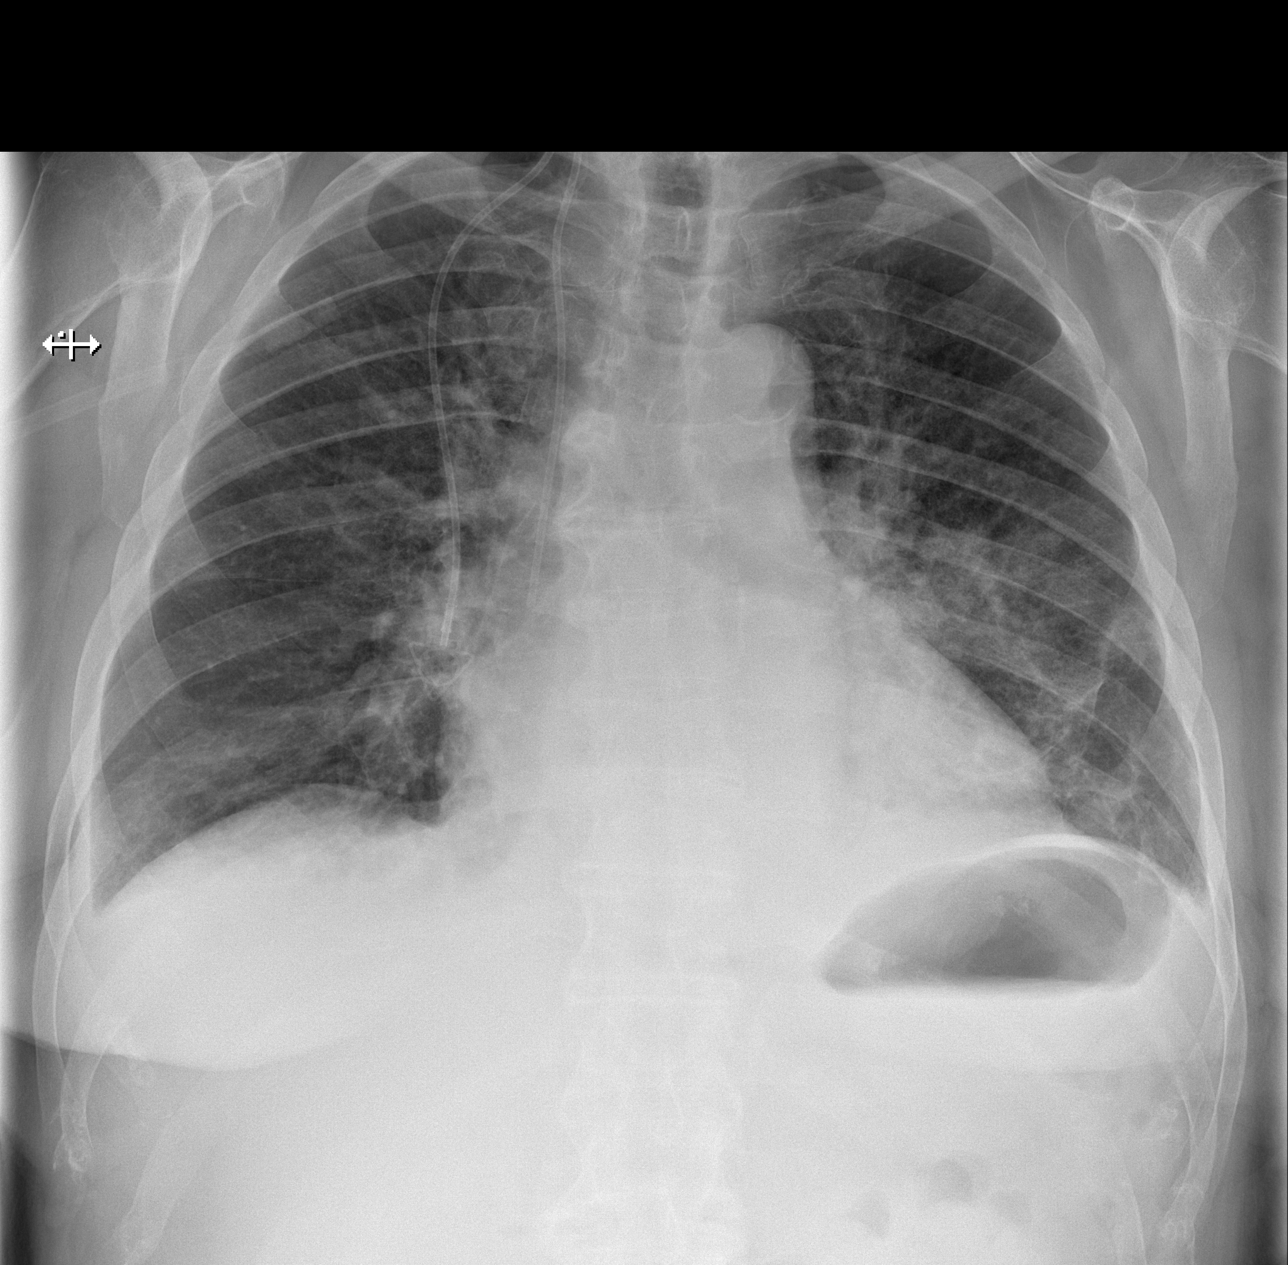

[w chest lat (1 of 2)]
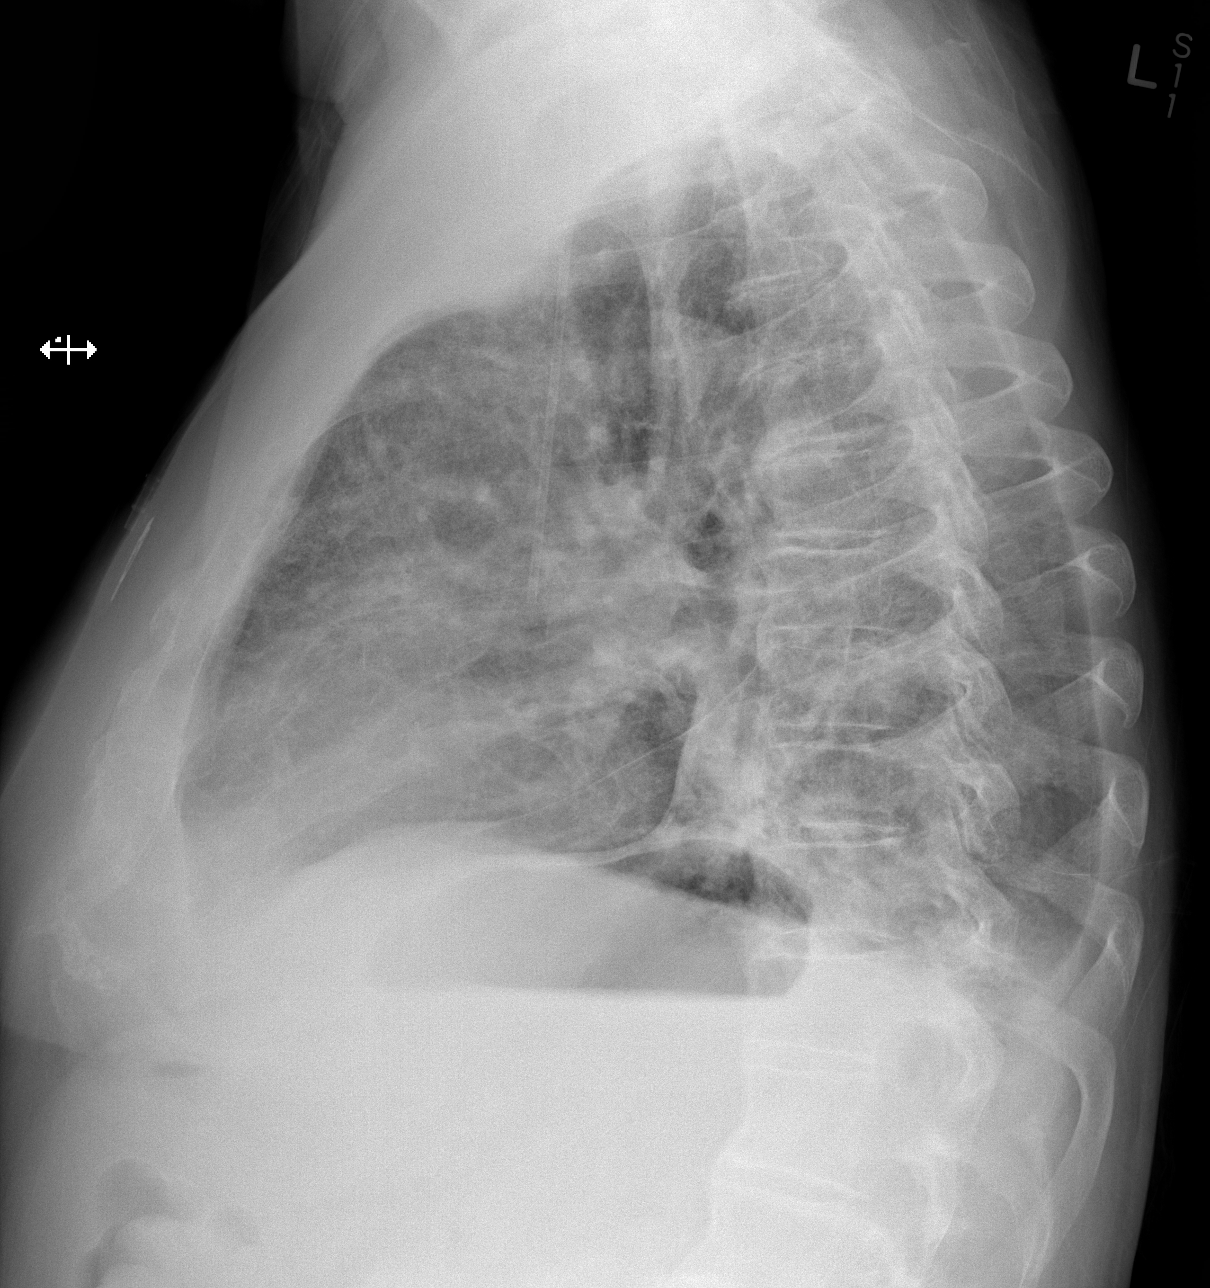

[w chest lat (2 of 2)]
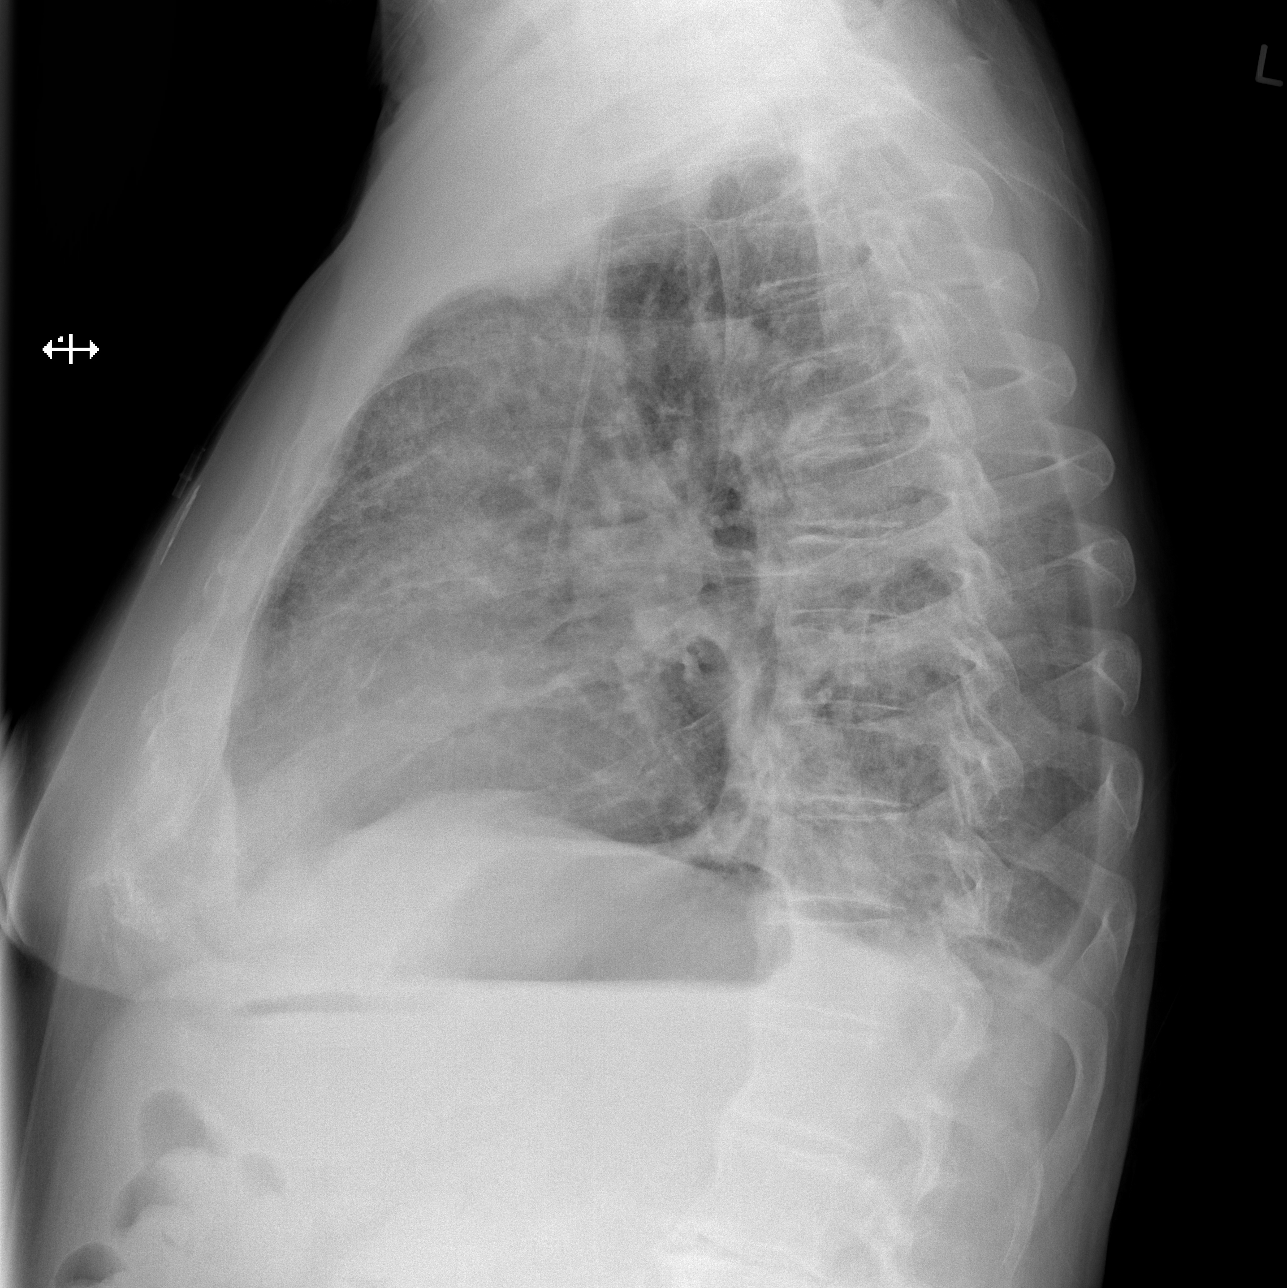

[3 of 3 positions shown; findings below may reference images not displayed]

FINDINGS: There is more opacity particular at the left lung base suspicious
for pneumonia. In addition, there is cardiomegaly present with mild
pulmonary vascular congestion and at least a small right pleural
effusion. These findings may indicate superimposed mild congestive
heart failure as well. Right-sided central venous line tip overlies
the expected SVC -RA junction. Diffuse degenerative change is noted
throughout the thoracic spine.
IMPRESSION: 1. Increased opacity at the bases may represent pneumonia as well as
atelectasis. Recommend continued followup.
2. Cardiomegaly with pulmonary vascular congestion and small right
effusion. Possible mild CHF.
